# Patient Record
Sex: Female | Born: 1935
Health system: Southern US, Community
[De-identification: ages and names within clinical notes are randomized; demographics above are authoritative.]

## PROBLEM LIST (undated history)

## (undated) DIAGNOSIS — I350 Nonrheumatic aortic (valve) stenosis: Secondary | ICD-10-CM

## (undated) DIAGNOSIS — N182 Chronic kidney disease, stage 2 (mild): Secondary | ICD-10-CM

## (undated) DIAGNOSIS — E039 Hypothyroidism, unspecified: Secondary | ICD-10-CM

## (undated) DIAGNOSIS — M4302 Spondylolysis, cervical region: Secondary | ICD-10-CM

## (undated) DIAGNOSIS — G8929 Other chronic pain: Secondary | ICD-10-CM

## (undated) DIAGNOSIS — J449 Chronic obstructive pulmonary disease, unspecified: Secondary | ICD-10-CM

## (undated) DIAGNOSIS — M51369 Other intervertebral disc degeneration, lumbar region without mention of lumbar back pain or lower extremity pain: Secondary | ICD-10-CM

## (undated) DIAGNOSIS — M5136 Other intervertebral disc degeneration, lumbar region: Secondary | ICD-10-CM

## (undated) DIAGNOSIS — I2510 Atherosclerotic heart disease of native coronary artery without angina pectoris: Secondary | ICD-10-CM

## (undated) DIAGNOSIS — M8580 Other specified disorders of bone density and structure, unspecified site: Secondary | ICD-10-CM

## (undated) DIAGNOSIS — H9190 Unspecified hearing loss, unspecified ear: Secondary | ICD-10-CM

## (undated) DIAGNOSIS — I6529 Occlusion and stenosis of unspecified carotid artery: Secondary | ICD-10-CM

## (undated) DIAGNOSIS — K589 Irritable bowel syndrome without diarrhea: Secondary | ICD-10-CM

## (undated) DIAGNOSIS — R1319 Other dysphagia: Secondary | ICD-10-CM

## (undated) DIAGNOSIS — E785 Hyperlipidemia, unspecified: Secondary | ICD-10-CM

## (undated) DIAGNOSIS — Z952 Presence of prosthetic heart valve: Secondary | ICD-10-CM

## (undated) DIAGNOSIS — M545 Low back pain, unspecified: Secondary | ICD-10-CM

## (undated) DIAGNOSIS — I671 Cerebral aneurysm, nonruptured: Secondary | ICD-10-CM

## (undated) DIAGNOSIS — I10 Essential (primary) hypertension: Secondary | ICD-10-CM

## (undated) DIAGNOSIS — K219 Gastro-esophageal reflux disease without esophagitis: Secondary | ICD-10-CM

## (undated) DIAGNOSIS — R42 Dizziness and giddiness: Secondary | ICD-10-CM

## (undated) HISTORY — DX: Chronic kidney disease, stage 2 (mild): N18.2

## (undated) HISTORY — DX: Dizziness and giddiness: R42

## (undated) HISTORY — DX: Spondylolysis, cervical region: M43.02

## (undated) HISTORY — DX: Occlusion and stenosis of unspecified carotid artery: I65.29

## (undated) HISTORY — PX: CATARACT EXTRACTION W/ INTRAOCULAR LENS  IMPLANT, BILATERAL: SHX1307

## (undated) HISTORY — DX: Other intervertebral disc degeneration, lumbar region: M51.36

## (undated) HISTORY — PX: BLEPHAROPLASTY: SUR158

## (undated) HISTORY — PX: ABDOMINAL HYSTERECTOMY: SHX81

## (undated) HISTORY — PX: TONSILLECTOMY: SUR1361

## (undated) HISTORY — DX: Irritable bowel syndrome, unspecified: K58.9

## (undated) HISTORY — DX: Nonrheumatic aortic (valve) stenosis: I35.0

## (undated) HISTORY — PX: CATARACT EXTRACTION, BILATERAL: SHX1313

## (undated) HISTORY — PX: APPENDECTOMY: SHX54

## (undated) HISTORY — DX: Cerebral aneurysm, nonruptured: I67.1

## (undated) HISTORY — PX: HEMORROIDECTOMY: SUR656

## (undated) HISTORY — PX: CARDIAC CATHETERIZATION: SHX172

## (undated) HISTORY — DX: Other specified disorders of bone density and structure, unspecified site: M85.80

## (undated) HISTORY — PX: EYE SURGERY: SHX253

## (undated) HISTORY — DX: Atherosclerotic heart disease of native coronary artery without angina pectoris: I25.10

## (undated) HISTORY — DX: Other intervertebral disc degeneration, lumbar region without mention of lumbar back pain or lower extremity pain: M51.369

---

## 1997-02-22 HISTORY — PX: LUMBAR LAMINECTOMY: SHX95

## 1997-12-17 ENCOUNTER — Ambulatory Visit (HOSPITAL_COMMUNITY): Admission: RE | Admit: 1997-12-17 | Discharge: 1997-12-17 | Payer: Self-pay | Admitting: *Deleted

## 1997-12-17 ENCOUNTER — Encounter: Payer: Self-pay | Admitting: *Deleted

## 1998-01-09 ENCOUNTER — Encounter: Payer: Self-pay | Admitting: Specialist

## 1998-01-13 ENCOUNTER — Ambulatory Visit (HOSPITAL_COMMUNITY): Admission: RE | Admit: 1998-01-13 | Discharge: 1998-01-13 | Payer: Self-pay | Admitting: Specialist

## 1998-04-14 ENCOUNTER — Ambulatory Visit (HOSPITAL_COMMUNITY): Admission: RE | Admit: 1998-04-14 | Discharge: 1998-04-14 | Payer: Self-pay | Admitting: Specialist

## 1998-07-25 ENCOUNTER — Other Ambulatory Visit: Admission: RE | Admit: 1998-07-25 | Discharge: 1998-07-25 | Payer: Self-pay | Admitting: *Deleted

## 1998-08-28 ENCOUNTER — Ambulatory Visit (HOSPITAL_COMMUNITY): Admission: RE | Admit: 1998-08-28 | Discharge: 1998-08-28 | Payer: Self-pay | Admitting: Gastroenterology

## 1999-03-31 ENCOUNTER — Ambulatory Visit (HOSPITAL_COMMUNITY): Admission: RE | Admit: 1999-03-31 | Discharge: 1999-03-31 | Payer: Self-pay | Admitting: Orthopaedic Surgery

## 1999-04-15 ENCOUNTER — Ambulatory Visit (HOSPITAL_COMMUNITY): Admission: RE | Admit: 1999-04-15 | Discharge: 1999-04-15 | Payer: Self-pay | Admitting: Orthopaedic Surgery

## 1999-04-28 ENCOUNTER — Ambulatory Visit (HOSPITAL_COMMUNITY): Admission: RE | Admit: 1999-04-28 | Discharge: 1999-04-28 | Payer: Self-pay | Admitting: Orthopaedic Surgery

## 1999-05-12 ENCOUNTER — Ambulatory Visit (HOSPITAL_COMMUNITY): Admission: RE | Admit: 1999-05-12 | Discharge: 1999-05-12 | Payer: Self-pay | Admitting: Orthopaedic Surgery

## 1999-09-23 HISTORY — PX: LUMBAR LAMINECTOMY/DECOMPRESSION MICRODISCECTOMY: SHX5026

## 1999-10-14 ENCOUNTER — Inpatient Hospital Stay (HOSPITAL_COMMUNITY): Admission: RE | Admit: 1999-10-14 | Discharge: 1999-10-15 | Payer: Self-pay | Admitting: Orthopaedic Surgery

## 2000-08-13 ENCOUNTER — Encounter: Payer: Self-pay | Admitting: Emergency Medicine

## 2000-08-13 ENCOUNTER — Emergency Department (HOSPITAL_COMMUNITY): Admission: EM | Admit: 2000-08-13 | Discharge: 2000-08-13 | Payer: Self-pay | Admitting: Emergency Medicine

## 2000-09-07 ENCOUNTER — Encounter (HOSPITAL_COMMUNITY): Admission: RE | Admit: 2000-09-07 | Discharge: 2000-09-12 | Payer: Self-pay | Admitting: Family Medicine

## 2000-09-12 ENCOUNTER — Encounter (HOSPITAL_COMMUNITY): Admission: RE | Admit: 2000-09-12 | Discharge: 2000-12-11 | Payer: Self-pay | Admitting: Family Medicine

## 2000-09-22 HISTORY — PX: SKIN GRAFT SPLIT THICKNESS LEG / FOOT: SUR1303

## 2000-10-17 ENCOUNTER — Ambulatory Visit (HOSPITAL_BASED_OUTPATIENT_CLINIC_OR_DEPARTMENT_OTHER): Admission: RE | Admit: 2000-10-17 | Discharge: 2000-10-17 | Payer: Self-pay | Admitting: Specialist

## 2000-12-09 ENCOUNTER — Other Ambulatory Visit: Admission: RE | Admit: 2000-12-09 | Discharge: 2000-12-09 | Payer: Self-pay | Admitting: Family Medicine

## 2002-03-08 ENCOUNTER — Encounter: Payer: Self-pay | Admitting: *Deleted

## 2002-03-08 ENCOUNTER — Ambulatory Visit (HOSPITAL_COMMUNITY): Admission: RE | Admit: 2002-03-08 | Discharge: 2002-03-08 | Payer: Self-pay | Admitting: *Deleted

## 2002-04-17 ENCOUNTER — Ambulatory Visit (HOSPITAL_COMMUNITY): Admission: RE | Admit: 2002-04-17 | Discharge: 2002-04-17 | Payer: Self-pay | Admitting: Cardiology

## 2003-11-23 HISTORY — PX: COLONOSCOPY W/ BIOPSIES AND POLYPECTOMY: SHX1376

## 2003-11-29 ENCOUNTER — Ambulatory Visit (HOSPITAL_COMMUNITY): Admission: RE | Admit: 2003-11-29 | Discharge: 2003-11-29 | Payer: Self-pay | Admitting: Gastroenterology

## 2003-11-29 ENCOUNTER — Encounter (INDEPENDENT_AMBULATORY_CARE_PROVIDER_SITE_OTHER): Payer: Self-pay | Admitting: Specialist

## 2006-03-10 ENCOUNTER — Other Ambulatory Visit: Admission: RE | Admit: 2006-03-10 | Discharge: 2006-03-10 | Payer: Self-pay | Admitting: Family Medicine

## 2007-02-23 HISTORY — PX: BACK SURGERY: SHX140

## 2009-02-07 ENCOUNTER — Encounter: Admission: RE | Admit: 2009-02-07 | Discharge: 2009-02-07 | Payer: Self-pay | Admitting: Family Medicine

## 2009-06-10 ENCOUNTER — Encounter: Admission: RE | Admit: 2009-06-10 | Discharge: 2009-06-10 | Payer: Self-pay | Admitting: Family Medicine

## 2009-11-16 ENCOUNTER — Encounter (INDEPENDENT_AMBULATORY_CARE_PROVIDER_SITE_OTHER): Payer: Self-pay | Admitting: Family Medicine

## 2009-11-16 ENCOUNTER — Ambulatory Visit (HOSPITAL_COMMUNITY): Admission: RE | Admit: 2009-11-16 | Discharge: 2009-11-16 | Payer: Self-pay | Admitting: Family Medicine

## 2009-11-16 ENCOUNTER — Ambulatory Visit: Payer: Self-pay | Admitting: Vascular Surgery

## 2010-03-14 ENCOUNTER — Encounter: Payer: Self-pay | Admitting: Family Medicine

## 2010-07-10 NOTE — Op Note (Signed)
Becky Mccall, Becky Mccall            ACCOUNT NO.:  [id number]   MEDICAL RECORD NO.:  [id number]          PATIENT TYPE:  AMB   LOCATION:  ENDO                         FACILITY:  [facility]   PHYSICIAN:  Petra Kuba, M.D.    DATE OF BIRTH:  07-30-1929   DATE OF PROCEDURE:  11/29/2003  DATE OF DISCHARGE:                                 OPERATIVE REPORT   PROCEDURE:  Colonoscopy with polypectomy.   INDICATIONS FOR PROCEDURE:  History of colon polyps, due for repeat  screening.  Consent was signed after risks, benefits, methods, and options  were thoroughly discussed in the past.   MEDICATIONS:  Demerol 70, Versed 7.   DESCRIPTION OF PROCEDURE:  Rectal inspection was pertinent for external  hemorrhoids.  Digital examination was negative.  Video pediatric adjustable  colonoscope was inserted and fairly easily advanced around the colon to the  cecum.  This did require rolling her on her back and various abdominal  pressures.  No obvious abnormality was seen on insertion.  The cecum was  identified by the appendiceal orifice and the ileocecal valve.  The prep was  adequate.  There was some liquid stool that required washing and suctioning.  On slow withdrawal through the colon, the cecum was normal and the more  distal ascending near the hepatic flexure, a small polyp was seen and was  hot biopsied x2 and put in the first container.  The scope was slowly  withdrawn.  In the mid to distal sigmoid another tiny polyp was seen and was  hot biopsied x1 and put in a separate container.  There was a rare left-  sided diverticuli seen, but no other abnormalities.  Anorectal pullthrough  and retroflexion confirmed some small hemorrhoids.  The scope was reinserted  a short ways up the left side of the colon.  Air was suctioned and the scope  removed.  The patient tolerated the procedure well and there was no obvious  immediate complication.   ENDOSCOPIC ASSESSMENT:  1.  Internal and external  hemorrhoids.  2.  Rare left-sided diverticuli.  3.  Two tiny sigmoid and proximal level of hepatic flexure polyps hot      biopsied.  4.  Otherwise within normal limits to the cecum.   PLAN:  Await pathology.  Probably recheck colon screening in five years if  doing well medically.  Happy to see back p.r.n.  Otherwise return care to  Dr. Cliffton Asters for the customary health care maintenance to include yearly  rectals and guaiacs.       MEM/MEDQ  D:  11/29/2003  T:  11/29/2003  Job:  [id number]   cc:   Stacie Acres. White, M.D.  [address]  [place]  [place] [address]  Fax: [phone]

## 2010-07-10 NOTE — Op Note (Signed)
[place]. [facility]  Patient:    Becky Mccall, Becky Mccall Visit Number: [id number] MRN: [id number]          Service Type: DSU Location: [facility] Attending Physician:  Gustavus Messing Dictated by:   Yaakov Guthrie. Shon Hough, M.D. Proc. Date: 10/17/00 Adm. Date:  10/17/2000                             Operative Report  HISTORY OF PRESENT ILLNESS:  This is a 77 year old lady who has injured several years ago by having a severe evulsion injury to her left lower leg ankle area.  We treated it with intensive, intensive conservative treatment, allowing [place] to debride off eschar and devitalized dead tissue.  She now has open defects on the left posterior ankle area with excellent granulation tissue.  PROCEDURES PLANNED:  Split-thickness skin graft coverage.  SURGEON:  Yaakov Guthrie. Shon Hough, M.D.  ANESTHESIA:  General.  DESCRIPTION:  Patient underwent general anesthesia and intubated orally, was placed in a prone position, and prep was done to the ankle and leg areas and the left posterior thigh separately with Betadine soap and solution and walled off with sterile towels and draped so as to make a sterile field.  A split-thickness skin graft was taken from the posterior thigh using a Brown dermatome, 0.02 thickness, was placed over the defect and secured with 4-0 Prolene sutures.  A stent dressing was made using Xeroform, 4x4s, ABDs, Kerlix, and ACE wraps involving the ankle area.  The donor site was covered with scarlet red, Telfa, 4x4s, ABDs, Hypafix tape.  She withstood the procedures very well, was taken to recovery in excellent condition.  FOLLOW-UP:  The patient postoperatively is to stay off the ankle and not put any weightbearing.  She is to see me back in the office on this Friday for a dressing change, to review, and to check out the graft. Dictated by:   Yaakov Guthrie. Shon Hough, M.D. Attending Physician:  Gustavus Messing DD:  10/17/00 TD:   10/17/00 Job: [id number]

## 2010-07-10 NOTE — Op Note (Signed)
[place]. [facility]  Patient:    Becky Mccall, Becky Mccall                   MRN: [id number] Proc. Date: 10/14/99 Adm. Date:  [id number] Disc. Date: [id number] Attending:  Jacki Cones                           Operative Report  PREOPERATIVE DIAGNOSIS:  L3-4 spinal stenosis with herniated nucleus pulposus.  POSTOPERATIVE DIAGNOSIS:  L3-4 spinal stenosis with herniated nucleus pulposus.  PROCEDURE:  L3-4 decompression, bilateral foraminotomies, midline decompression and right L3-4 microdiskectomy.  SURGEON:  Mark C. Ophelia Charter, M.D.  ASSISTANT:  Quinn Plowman, P.A.-C.  ANESTHESIA:  GOT.  ESTIMATED BLOOD LOSS:  100 cc.  DESCRIPTION OF PROCEDURE:  After induction of a general anesthesia, the patient was placed in a kneeling position on the [place] frame with standard preoperative Ancef, Duraprep, squared towels, Betadine Vi-drape and laminectomy sheets and drapes.  Midline incision was made after needle localization with a cross-table lateral x-ray at the L3-4 space level. Subperiosteal dissection was performed.  Self-retaining retractor was placed. Subperiosteal dissection of the lamina.  Spinous process of L3 was taken leaving just the superior aspect and removing the inferior two-thirds of the lamina on both the right and the side.  Ligament was hypertrophic and the top portion of L4 was removed as well and foramina were enlarged.  Bone had to be removed with an osteotome out to the level of the pedicle with extreme thickening.  Remaining edges were smoothed with a Kerrison until bone had been removed to both the right and left level of the pedicle with no sharp spikes. This allowed the compressed dura to come out to normal size.  There was some foraminal stenosis with facet overgrowth and this was removed.  Hockey-stick was used for palpation. There were no free fragments present, but the disk was firm and bulging.  It was herniated with some  subligamentous herniation behind the body of L4 on the right side worse than the left.  Patient basically had stenosis symptoms and a 15 scalpel blade was used to make an annular incision and the extruded portion of the disk was removed.  ______ were made in the L3 disk, but this only accept a micropituitary and the disk was dehydrated.  No free fragments were present.  Foramina were carefully probed again and were loose.  After irrigation with saline solution, fascia was closed with 0 Vicryl, 2-0 Vicryl in subcutaneous tissue.  Skin staple closure, Marcaine infiltration in the skin and postoperative dressing.  Instrument count and needle counts were correct.  Patient transferred to recovery room in stable condition. DD:  10/14/99 TD:  10/15/99 Job: [id number]

## 2010-07-10 NOTE — H&P (Signed)
[place]. [facility]  Patient:    Becky Mccall, Becky Mccall                     MRN: [id number] Adm. Date:  10/14/99 Attending:  Loraine Leriche C. Ophelia Charter, M.D. Dictator:   Madolyn Frieze. [place], P.A.                         History and Physical  DATE OF BIRTH: 1929/03/16  CHIEF COMPLAINT: Low back pain for many years.  HISTORY OF PRESENT ILLNESS: This patient is a 77 year old white female who presents with progressive low back pain for the last several years.  The patient states the back pain is worse when she is standing and walking during the day, to the point where she cannot complete her activities of daily living most recently.  She says the pain is relieved somewhat by sitting; however, any activity such as walking and standing for prolonged periods of time precipitated episodes of pain.  She rates her pain as 9/10, with 10 being the worst pain experienced.  She has failed conservative measures including epidural steroid injection x 3 and nonsteroidal anti-inflammatories and narcotics.  She has no radicular pain.  All her pain is located in her central lumbosacral spine.  She denies any associated symptoms such as numbness, tingling, or weakness of the lower extremities.  She denies any specific trauma or injury.  ALLERGIES: No known drug allergies.  MEDICATIONS:  1. Premarin 0.625 mg 1 tablet q.d.  2. Synthroid 0.125 mg 1 q.d.  3. Lipitor 20 mg 1 tablet q.d.  4. Naproxen 500 mg 1 tablet b.i.d.  5. Vitamin C 1000 IU 1 q.d.  6. Vitamin E 800 IU 1 q.d.  7. Calcium 600 mg 1 tablet q.d.  PAST SURGICAL HISTORY:  1. In 1949 she had "tubes infected".  2. In 1979 she had total abdominal hysterectomy.  3. She had hemorrhoid fissure surgery in 1979.  4. She had colonoscopy performed in 1995 and 2000.  SOCIAL HISTORY: She is a nonsmoker; however, she smoked two to three packs of cigarettes per day for 35 years.  She has one drink of rupture of membranes per day.  She  is retired.  She is married.  FAMILY HISTORY: Positive for CAD, MI, CVA, cancer the breast and lung.  She denies diabetes mellitus.  REVIEW OF SYSTEMS: A 14 point Review Of Systems was conducted, showing positive findings of glasses, upper and lower dentures, heartburn, history of chronic shortness of breath, osteoarthritis, frequent urinary frequency; however, the patient attributes this to drinking a large amount of tea in a day.  She has sinus problems with dizziness two to three minutes at a time. She denies any chest pain, PND, change in appetite, recent unexpected weight loss, fever, chills, nausea, vomiting, diarrhea, constipation, heat or cold intolerance, abdominal pain, jaundice, hepatitis, decreased sensation in extremities.  PHYSICAL EXAMINATION:  VITAL SIGNS: Temperature 97.5 degrees, pulse 76 and regular, respirations 16 and regular, blood pressure 120/72.  GENERAL: The patient is a 77 year old white female in no acute distress, well-developed, well-nourished, and alert and oriented x 3.  HEENT: Head normocephalic, atraumatic.  Ears show TMs pearly gray bilaterally. PERRLA.  EOMI.  Throat without erythema or exudate.  Nose without erythema.  NECK: Without bruits, JVD, thyromegaly, or lymphadenopathy.  CHEST: Clear to auscultation bilaterally.  HEART: Regular rate and rhythm without murmur, S1 and S2.  BREAST/GU: Examinations deferred.  ABDOMEN:  Soft.  Bowel sounds present.  No masses, tenderness, or organomegaly.  EXTREMITIES: Positive pain to palpation over the lumbosacral spine and slight pain over the sciatic notch bilaterally.  Motor strength of the lower extremities is 5/5 in the anterior tibial and EHL and gastrocnemius.  Deep tendon reflexes are 2/4 bilaterally and symmetric.  Neurovascular sensation intact.  Negative straight leg test bilaterally.  Good flexion at the waist and extension at the waist and lateral bending, touching the knees.  SKIN:  Without active infection.  She has bruises on the forearms.  LABORATORY DATA: MRI is consistent with an L3-L4 herniated nucleus pulposus with spinal stenosis.  Preoperative laboratories reveal chest x-ray showing stable chest with no evidence of acute cardiac or pulmonary process.  EKG reveals normal sinus rhythm, normal EKG.  WBC 7.5 (done on October 09, 1999), RBC 4.48, hemoglobin 13.7, hematocrit 40.4; MCV 90.2; MCHC 33.9; RDW 12.8; platelets 126,000.  PT 13.5, INR 1.1, PTT 28. Sodium 141, potassium 5.0, chloride 107, carbon dioxide 27, glucose 90, BUN 16, creatinine 0.9.  Calcium 9.5.  PT 6.9.  ALD 4.1, AST 29, ALT 1380, ALP 62, total bilirubin 0.8.  Urinalysis is pending at this time.  ASSESSMENT: L3-L4 herniated nucleus pulposus with stenosis.  PLAN: The plan is to have the patient undergo L3-L4 decompression.  The patient was explained the nonoperative and operative treatment courses and the plan of procedure was discussed.  The risks of the procedure including infection, bleeding, spinal cord damage, dural tear, anesthesia complications, reoperation, artery and nerve damage were discussed and the patient understood.  All questions were answered fully and with no further questions the patient wished to proceed with the planned procedure. DD:  10/14/99 TD:  10/14/99 Job: [id number]

## 2010-07-10 NOTE — Discharge Summary (Signed)
[place]. [facility]  Patient:    Becky Mccall, Becky Mccall                     MRN: [id number] Attending:  Veverly Fells. Ophelia Charter, M.D. Dictator:   Quinn Plowman, P.A.-C.                           Discharge Summary  ADMISSION DIAGNOSIS:  L4-L5 herniated nucleus pulposus and spinal stenosis.  DISCHARGE DIAGNOSIS:  L3-L4 herniated nucleus pulposus and spinal stenosis.  ADDITIONAL DISCHARGE DIAGNOSES: 1. Hypercholesterolemia. 2. Hypothyroid. 3. Osteoarthritis. 4. Spinal stenosis. 5. Estrogen replacement therapy. 6. Osteoporosis. 7. Status post total abdominal hysterectomy. 8. Status post hemorrhoid fissure surgery. 9. Status post colonoscopy.  PROCEDURES:  On October 14, 1999, Mrs. Guardino underwent an L3-L4 decompression and a right microdissection for the diagnosis of L3-L4 herniated nucleus pulposus and spinal stenosis.  Surgeon was [employer]. Ophelia Charter, M.D.  First assistant week Madolyn Frieze. [place], Advice worker certified.  Anesthesia: GET.  Estimated blood loss:  250 cc.  Complications:  None.  Drains:  None. Specimens:  None.  HISTORY OF PRESENT ILLNESS:  This is a 77 year old white female with progressively worsening low back pain for many years.  However, for the last several months the pain has been significantly worse.  She denies any radiation down her legs.  The pain is worse when she is standing and walking, better when she is sitting.  She has failed conservative therapy including epidural steroid injections x 3, multiple nonsteroidal anti-inflammatories, and narcotics.  The patient rates her pain as a 7/10 being the worst pain ever experienced.  She denies any associated symptoms such as numbness and tingling of the lower extremities or any abnormalities of her hips and knees and ankles bilaterally.  PHYSICAL EXAMINATION:  GENERAL:  [age]-year-old white female in no acute distress. Well-developed, well-nourished, alert and oriented x  3.  CHEST:  Clear to auscultation bilaterally.  Without wheezing, rhonchi, or rales.  HEART:  Regular rate and rhythm.  Without murmur, S1, S2.  ABDOMEN:  Soft.  Bowel sounds present.  No masses, tenderness, or organomegaly.  EXTREMITIES:  Positive tenderness to palpation of the lumbosacral spine and some sciatic notch tenderness.  NEUROLOGIC:  Her motor strength of her lower extremities, to her anterior tibia, gastrocnemius, and EHL was positive, was +5/5.  Deep tendon reflexes 2/4 bilaterally, symmetrical.  Neurovascular sensation is intact bilaterally. She has negative straight leg raise testing in the sitting and supine positions.  Also negative contralateral straight leg testing.  SKIN:  Without active infection; however, there is bruising and ecchymosis noted on bilateral forearms.  LABORATORY DATA:  MRI was consistent with an L3-L4 herniated nucleus pulposus and spinal stenosis.  Urinalysis is negative for nitrates, protein, ketones, bilirubin, hemoglobin, glucose, pH 5.5, specific gravity 1.019.  White blood count was 5.7, RBC count 4.4, hemoglobin 13.7, hematocrit 40.4, MCV 90.2, MCHC 33.9, RDW 12.8, platelets 216,000.  PT is 13.5, INR is 1.1, PTT is 28.  Sodium is 141, potassium 5.0, chloride 107, CO2 27, glucose 90, BUN 16, creatinine 0.9, calcium 9.5.  calcium is 9.5, SGOT 6.9, ALB is 4.1, AST is 20, ALT is 13, ALP is 62, total bilirubin is 0.8.  Chest x-ray:  Stable chest, without evidence of acute cardiac or acute pulmonary process.  EKG is normal sinus rhythm, normal EKG.  HOSPITAL COURSE:  On October 14, 1999, the patient underwent surgery for preoperative diagnosis  of L3-L4 herniated nucleus pulposus and spinal stenosis.  The procedure was an L3-L4 decompression with an L3-L4 right microdiskectomy performed by Dr. Loraine Leriche C. Ophelia Charter, first assistant Madolyn Frieze. Robeson, physician assistant certified on postoperative day #1, 10/15/99.  Discharge note:  The patient  had no complaints.  Her pain is in good control with p.o. Tylox.  She ambulates to the bathroom well without assistance.  Eats well without any nausea or vomiting.  She denies any numbness or tingling of her lower extremities.  She is agreeable to be discharged home today.  She denies any fever, chills, nausea, vomiting, diarrhea, chest pain, headaches, shortness of breath.  On exam today, her anterior tibia, EHL, and gastrocnemius are +5/5 bilaterally.  Her neurovascular sensation is intact. Vital signs prior to discharge show temperature 98.1, pulse 77, respirations 20, BP is 138/72, room air oxygen saturation is 97%.  PLAN:  Discharge home.  DIAGNOSIS:  L3-L4 herniated nucleus pulposus and spinal stenosis.  CONDITION ON DISCHARGE:  Stable.  FOLLOW-UP:  Will see her back in the office in one week with Dr. Ophelia Charter. Telephone number given, [phone].  If any abnormalities or problems would arise, please give a call to the office at the same number, and care will be rendered promptly.  DISCHARGE MEDICATIONS: 1. Prescription for Tylox was written for her to be filled, 1 tablet    p.o. q.4-6h. p.r.n.    severe pain, #30 with 0 refills. 2. She should resume all her home medicines, which would include:    a. Premarin 0.625 mg 1 q.a.m.    b. Synthroid 0.125 mg q.a.m.    c. Lipitor 20 mg 1 q.p.m.    d. Naproxen 500 mg 1 b.i.d.    e. Vitamin C 1000 mg.    f. Vitamin E 800 IU.    g. Calcium 600 mg.    h. Nasal spray saline p.r.n.  WOUND CARE:  Instructions were given  for showering, dressing changes.  She should receive a dressing change before she leaves today.  DIET:  No restrictions.  DISCHARGE INSTRUCTIONS:  She was advised no heavy lifting until further advice given one week in the office per Mark C. Ophelia Charter, M.D. DD:  10/15/99 TD:  10/16/99 Job: [id number]

## 2011-04-30 ENCOUNTER — Other Ambulatory Visit: Payer: Self-pay | Admitting: Sports Medicine

## 2011-04-30 DIAGNOSIS — M5410 Radiculopathy, site unspecified: Secondary | ICD-10-CM

## 2011-04-30 DIAGNOSIS — M545 Low back pain, unspecified: Secondary | ICD-10-CM

## 2011-05-06 ENCOUNTER — Ambulatory Visit
Admission: RE | Admit: 2011-05-06 | Discharge: 2011-05-06 | Disposition: A | Discharge status: Home / Self Care | Payer: Medicare Other | Source: Ambulatory Visit | Attending: Sports Medicine | Admitting: Sports Medicine

## 2011-05-06 DIAGNOSIS — M545 Low back pain, unspecified: Secondary | ICD-10-CM

## 2011-05-06 DIAGNOSIS — M5410 Radiculopathy, site unspecified: Secondary | ICD-10-CM

## 2011-05-06 MED ORDER — GADOBENATE DIMEGLUMINE 529 MG/ML IV SOLN
20.0000 mL | Freq: Once | INTRAVENOUS | Status: AC | PRN
  Administered 2011-05-06: 20 mL via INTRAVENOUS

## 2012-02-23 HISTORY — PX: POSTERIOR FUSION LUMBAR SPINE: SUR632

## 2012-12-01 ENCOUNTER — Other Ambulatory Visit: Payer: Self-pay | Admitting: Neurosurgery

## 2012-12-01 DIAGNOSIS — M5126 Other intervertebral disc displacement, lumbar region: Secondary | ICD-10-CM

## 2012-12-05 ENCOUNTER — Ambulatory Visit
Admission: RE | Admit: 2012-12-05 | Discharge: 2012-12-05 | Disposition: A | Discharge status: Home / Self Care | Payer: Medicare Other | Source: Ambulatory Visit | Attending: Neurosurgery | Admitting: Neurosurgery

## 2012-12-05 DIAGNOSIS — M5126 Other intervertebral disc displacement, lumbar region: Secondary | ICD-10-CM

## 2012-12-14 ENCOUNTER — Other Ambulatory Visit: Payer: Self-pay | Admitting: Neurosurgery

## 2012-12-26 ENCOUNTER — Encounter (HOSPITAL_COMMUNITY): Payer: Self-pay | Admitting: Pharmacy Technician

## 2012-12-28 NOTE — Pre-Procedure Instructions (Signed)
NAVJOT PILGRIM  12/28/2012   Your procedure is scheduled on:  Monday, November 17th.  Report to [facility], Main Entrance [facility] "A"at 9:55 AM.  Call this number if you have problems the morning of surgery: [phone]   Remember:   Do not eat food or drink liquids after midnight,  Sunday, November 16th.   Take these medicines the morning of surgery with A SIP OF WATER: None.              Stop taking herbal medications, including vitamins and fish oil, on Monday, November 10 th.   Do not wear jewelry, make-up or nail polish.  Do not wear lotions, powders, or perfumes. You may wear deodorant.  Do not shave 48 hours prior to surgery.   Do not bring valuables to the hospital.  [facility] is not responsible                  for any belongings or valuables.               Contacts, dentures or bridgework may not be worn into surgery.  Leave suitcase in the car. After surgery it may be brought to your room.  For patients admitted to the hospital, discharge time is determined by your  treatment team.                Special Instructions: Shower using CHG 2 nights before surgery and the night before surgery.  If you shower the day of surgery use CHG.  Use special wash - you have one bottle of CHG for all showers.  You should use approximately 1/3 of the bottle for each shower.   Please read over the following fact sheets that you were given: Pain Booklet, Coughing and Deep Breathing, Blood Transfusion Information and Surgical Site Infection Prevention

## 2012-12-29 ENCOUNTER — Ambulatory Visit (HOSPITAL_COMMUNITY)
Admission: RE | Admit: 2012-12-29 | Discharge: 2012-12-29 | Disposition: A | Discharge status: Home / Self Care | Payer: Medicare Other | Source: Ambulatory Visit | Attending: Anesthesiology | Admitting: Anesthesiology

## 2012-12-29 ENCOUNTER — Encounter (HOSPITAL_COMMUNITY): Payer: Self-pay | Admitting: Vascular Surgery

## 2012-12-29 ENCOUNTER — Encounter (HOSPITAL_COMMUNITY)
Admission: RE | Admit: 2012-12-29 | Discharge: 2012-12-29 | Disposition: A | Discharge status: Home / Self Care | Payer: Medicare Other | Source: Ambulatory Visit | Attending: Neurosurgery | Admitting: Neurosurgery

## 2012-12-29 ENCOUNTER — Encounter (HOSPITAL_COMMUNITY): Payer: Self-pay

## 2012-12-29 DIAGNOSIS — Z01812 Encounter for preprocedural laboratory examination: Secondary | ICD-10-CM | POA: Insufficient documentation

## 2012-12-29 DIAGNOSIS — Z01818 Encounter for other preprocedural examination: Secondary | ICD-10-CM | POA: Insufficient documentation

## 2012-12-29 DIAGNOSIS — Z01811 Encounter for preprocedural respiratory examination: Secondary | ICD-10-CM | POA: Insufficient documentation

## 2012-12-29 DIAGNOSIS — Z01810 Encounter for preprocedural cardiovascular examination: Secondary | ICD-10-CM | POA: Insufficient documentation

## 2012-12-29 HISTORY — DX: Essential (primary) hypertension: I10

## 2012-12-29 HISTORY — DX: Other dysphagia: R13.19

## 2012-12-29 HISTORY — DX: Unspecified hearing loss, unspecified ear: H91.90

## 2012-12-29 HISTORY — DX: Hyperlipidemia, unspecified: E78.5

## 2012-12-29 HISTORY — DX: Hypothyroidism, unspecified: E03.9

## 2012-12-29 HISTORY — DX: Gastro-esophageal reflux disease without esophagitis: K21.9

## 2012-12-29 HISTORY — DX: Chronic obstructive pulmonary disease, unspecified: J44.9

## 2012-12-29 LAB — BASIC METABOLIC PANEL
CO2: 22 mEq/L (ref 19–32)
GFR calc Af Amer: 67 mL/min — ABNORMAL LOW (ref >90)
GFR calc non Af Amer: 58 mL/min — ABNORMAL LOW (ref >90)
Glucose, Bld: 107 mg/dL — ABNORMAL HIGH (ref 70–99)
Potassium: 3.8 mEq/L (ref 3.5–5.1)
Sodium: 137 mEq/L (ref 135–145)

## 2012-12-29 LAB — CBC
Hemoglobin: 13.9 g/dL (ref 12.0–15.0)
MCH: 32.6 pg (ref 26.0–34.0)
MCV: 95.1 fL (ref 78.0–100.0)
RBC: 4.26 MIL/uL (ref 3.87–5.11)

## 2012-12-29 LAB — SURGICAL PCR SCREEN: MRSA, PCR: NEGATIVE

## 2012-12-29 LAB — TYPE AND SCREEN
ABO/RH(D): O POS
Antibody Screen: NEGATIVE

## 2012-12-29 LAB — ABO/RH: ABO/RH(D): O POS

## 2012-12-29 NOTE — Pre-Procedure Instructions (Deleted)
Becky Mccall  12/29/2012   Your procedure is scheduled on:  Monday, November 17th.  Report to [facility], Main Entrance /Entrance "A"at 9:55 AM.  Call this number if you have problems the morning of surgery: [phone]   Remember:   Do not eat food or drink liquids after midnight,  Sunday, November 16th.   Take these medicines the morning of surgery with A SIP OF WATER: None.              Stop taking herbal medications, including vitamins and fish oil, on Monday, November 10 th.   Do not wear jewelry, make-up or nail polish.  Do not wear lotions, powders, or perfumes. You may wear deodorant.  Do not shave 48 hours prior to surgery.   Do not bring valuables to the hospital.  [facility] is not responsible  for any belongings or valuables.               Contacts, dentures or bridgework may not be worn into surgery.  Leave suitcase in the car. After surgery it may be brought to your room.  For patients admitted to the hospital, discharge time is determined by your  treatment team.                Special Instructions: Shower using CHG 2 nights before surgery and the night before surgery.  If you shower the day of surgery use CHG.  Use special wash - you have one bottle of CHG for all showers.  You should use approximately 1/3 of the bottle for each shower.   Please read over the following fact sheets that you were given: Pain Booklet, Coughing and Deep Breathing, Blood Transfusion Information and Surgical Site Infection Prevention     

## 2012-12-29 NOTE — Pre-Procedure Instructions (Signed)
Becky Mccall  12/29/2012   Your procedure is scheduled on:  Monday, November 17th.  Report to [facility], Main Entrance [facility] "A"at 9:55 AM.  Call this number if you have problems the morning of surgery: [phone]   Remember:   Do not eat food or drink liquids after midnight,  Sunday, November 16th.   Take these medicines the morning of surgery with A SIP OF WATER: None.              Stop taking herbal medications, including vitamins and fish oil, on Monday, November 10 th.   Do not wear jewelry, make-up or nail polish.  Do not wear lotions, powders, or perfumes. You may wear deodorant.  Do not shave 48 hours prior to surgery.   Do not bring valuables to the hospital.  [facility] is not responsible  for any belongings or valuables.               Contacts, dentures or bridgework may not be worn into surgery.  Leave suitcase in the car. After surgery it may be brought to your room.  For patients admitted to the hospital, discharge time is determined by your  treatment team.                Special Instructions: Shower using CHG 2 nights before surgery and the night before surgery.  If you shower the day of surgery use CHG.  Use special wash - you have one bottle of CHG for all showers.  You should use approximately 1/3 of the bottle for each shower.   Please read over the following fact sheets that you were given: Pain Booklet, Coughing and Deep Breathing, Blood Transfusion Information and Surgical Site Infection Prevention

## 2012-12-29 NOTE — Progress Notes (Signed)
Pt has draining wound on left lower leg ; was evaluated by Dr Cliffton Asters and dressed. Pt has a prescription for ABX.which she will start today. [facility] made  aware . Pt is to see Dr Cliffton Asters next week for reevaluation of wound. And medical clearance for surgery.

## 2013-01-01 ENCOUNTER — Encounter (HOSPITAL_COMMUNITY): Payer: Self-pay

## 2013-01-01 ENCOUNTER — Other Ambulatory Visit: Payer: Self-pay | Admitting: General Surgery

## 2013-01-01 ENCOUNTER — Encounter: Payer: Self-pay | Admitting: General Surgery

## 2013-01-01 DIAGNOSIS — Z01818 Encounter for other preprocedural examination: Secondary | ICD-10-CM

## 2013-01-01 NOTE — Progress Notes (Addendum)
Anesthesia Chart Review:  Patient is a 79 year old female scheduled for L2-3, L3-4, L4-5 PLIF on 01/08/13 by Dr. Wynetta Emery. History includes former smoker, HTN, hypothyroidism, COPD, GERD, hard of hearing, intermittent dysphagia, arthritis, HLD, CHF '04, mild AS/AR by 09/2012 echo, prior back surgery, hysterectomy, cataract extraction.    PCP is Dr. Samuel Jester who cleared patient from a medical standpoint but recommended contacting cardiologist Dr. Armanda Magic for cardiac clearance.  Patient is scheduled for a nuclear stress test on 01/04/13. (Of note, patient is was started on antibiotic therapy for a draining leg wound on 12/29/12 and is following up with Dr. Cliffton Asters this week for re-evaluation to make sure it is healing well in time for her planned surgery.  Patient's PAT RN has already notified Erie Noe at Dr. Lonie Peak office.)  EKG on 12/29/12 showed SR with first degree AVB.  Echo on 10/05/2012 [facility]) showed moderate concentric LVH, EF 56%, mild LAE, aortic valve sclerosis but opens well, mild AS, mild AR, mild TR, trace PR, Doppler findings suggestive of grade 1 diastolic dysfunction without elevated LA pressure.   CXR on 12/29/12 showed: Chronic COPD and scarring. No superimposed acute process. Aortic atherosclerosis.  Preoperative labs noted.   Will follow-up stress test results when available.  If low risk, then I would anticipate that she would be cleared.    Velna Ochs [facility] Short Stay Center/Anesthesiology Phone [phone] 01/01/2013 2:06 PM  Addendum: 01/04/2013 5:50 PM Nuclear stress test from earlier today showed: Low risk stress nuclear study very slight reversibility in the inferolateral wall which most likely represents diaphragmatic attenuation. LV Ejection Fraction: 75%. LV Wall Motion: NL LV Function; NL Wall Motion.

## 2013-01-04 ENCOUNTER — Encounter: Payer: Self-pay | Admitting: Cardiovascular Disease

## 2013-01-04 ENCOUNTER — Other Ambulatory Visit: Payer: Self-pay | Admitting: Family Medicine

## 2013-01-04 ENCOUNTER — Ambulatory Visit (HOSPITAL_COMMUNITY): Payer: Medicare Other | Attending: Cardiology | Admitting: Radiology

## 2013-01-04 VITALS — BP 165/76 | Ht 67.0 in | Wt 184.0 lb

## 2013-01-04 DIAGNOSIS — E785 Hyperlipidemia, unspecified: Secondary | ICD-10-CM | POA: Insufficient documentation

## 2013-01-04 DIAGNOSIS — Z01818 Encounter for other preprocedural examination: Secondary | ICD-10-CM

## 2013-01-04 DIAGNOSIS — Z87891 Personal history of nicotine dependence: Secondary | ICD-10-CM | POA: Insufficient documentation

## 2013-01-04 DIAGNOSIS — Z01810 Encounter for preprocedural cardiovascular examination: Secondary | ICD-10-CM | POA: Insufficient documentation

## 2013-01-04 DIAGNOSIS — Z8249 Family history of ischemic heart disease and other diseases of the circulatory system: Secondary | ICD-10-CM | POA: Insufficient documentation

## 2013-01-04 DIAGNOSIS — R0602 Shortness of breath: Secondary | ICD-10-CM

## 2013-01-04 DIAGNOSIS — R0989 Other specified symptoms and signs involving the circulatory and respiratory systems: Secondary | ICD-10-CM

## 2013-01-04 DIAGNOSIS — I10 Essential (primary) hypertension: Secondary | ICD-10-CM | POA: Insufficient documentation

## 2013-01-04 MED ORDER — TECHNETIUM TC 99M SESTAMIBI GENERIC - CARDIOLITE
33.0000 | Freq: Once | INTRAVENOUS | Status: AC | PRN
  Administered 2013-01-04: 33 via INTRAVENOUS

## 2013-01-04 MED ORDER — REGADENOSON 0.4 MG/5ML IV SOLN
0.4000 mg | Freq: Once | INTRAVENOUS | Status: AC
  Administered 2013-01-04: 0.4 mg via INTRAVENOUS

## 2013-01-04 MED ORDER — TECHNETIUM TC 99M SESTAMIBI GENERIC - CARDIOLITE
11.0000 | Freq: Once | INTRAVENOUS | Status: AC | PRN
  Administered 2013-01-04: 11 via INTRAVENOUS

## 2013-01-04 NOTE — Progress Notes (Signed)
[facility] SITE 3 NUCLEAR MED [address] [place], [place] [address] [phone]    Cardiology Nuclear Med Study  Becky Mccall is a 79 y.o. female     MRN : [id number]     DOB: 01/27/1930  Procedure Date: 01/04/2013  Nuclear Med Background Indication for Stress Test:  Evaluation for Ischemia and Surgical Clearance- Back surgery Dr. Donalee Citrin History:ECHO 8/14, EF:56 mild AS,A1:PREVIOS NUCLEAR STUDY /10 YRS AGO NML Cardiac Risk Factors: Family History - CAD, History of Smoking, Hypertension and Lipids  Symptoms:  SOB   Nuclear Pre-Procedure Caffeine/Decaff Intake:  None NPO After: 9:00pm   Lungs:  clear O2 Sat: 98% on room air. IV 0.9% NS with Angio Cath:  22g  IV Site: R Forearm  IV Started by:  Bonnita Levan, RN  Chest Size (in):  42 Cup Size: C  Height: 5\' 7"  (1.702 m)  Weight:  184 lb (83.462 kg)  BMI:  Body mass index is 28.81 kg/(m^2). Tech Comments:  N/A    Nuclear Med Study 1 or 2 day study: 1 day  Stress Test Type:  Lexiscan  Reading MD: Armanda Magic, MD  Order Authorizing Provider:  Armanda Magic, MD  Resting Radionuclide: Technetium 60m Sestamibi  Resting Radionuclide Dose: 10.7 mCi   Stress Radionuclide:  Technetium 79m Sestamibi  Stress Radionuclide Dose: 33.0 mCi           Stress Protocol Rest HR: 82 Stress HR: 90  Rest BP: 165/76 Stress BP: 133/47  Exercise Time (min): n/a METS: n/a   Predicted Max HR: 139 bpm % Max HR: 66.91 bpm Rate Pressure Product: 21308   Dose of Adenosine (mg):  n/a Dose of Lexiscan: 0.4 mg  Dose of Atropine (mg): n/a Dose of Dobutamine: n/a mcg/kg/min (at max HR)  Stress Test Technologist: Frederick Peers, EMT-P  Nuclear Technologist:  Domenic Polite, CNMT     Rest Procedure:  Myocardial perfusion imaging was performed at rest 45 minutes following the intravenous administration of Technetium 23m Sestamibi. Rest ECG: NSR - Normal EKG  Stress Procedure:  The patient received IV Lexiscan 0.4 mg over  15-seconds.  Technetium 69m Sestamibi injected at 30-seconds.  Quantitative spect images were obtained after a 45 minute delay. Stress ECG: No significant change from baseline ECG  QPS Raw Data Images:  Mild diaphragmatic attenuation.  Normal left ventricular size. Stress Images:  There is decreased uptake in the lateral wall. Rest Images:  There is decreased uptake in the lateral wall. Subtraction (SDS):  there is slight reversibility in the inferolateral wall  Transient Ischemic Dilatation (Normal <1.22):  0.90 Lung/Heart Ratio (Normal <0.45):  0.32  Quantitative Gated Spect Images QGS EDV:  63 ml QGS ESV:  16 ml  Impression Exercise Capacity:  Lexiscan with no exercise. BP Response:  Normal blood pressure response. Clinical Symptoms:  No significant symptoms noted. ECG Impression:  No significant ST segment change suggestive of ischemia. Comparison with Prior Nuclear Study: No images to compare  Overall Impression:  Low risk stress nuclear study very slight reversibility in the inferolateral wall which most likely represents diaphragmatic attenuation.  LV Ejection Fraction: 75%.  LV Wall Motion:  NL LV Function; NL Wall Motion   Signed, Armanda Magic, MD 01/04/2013

## 2013-01-05 ENCOUNTER — Telehealth: Payer: Self-pay | Admitting: General Surgery

## 2013-01-05 ENCOUNTER — Ambulatory Visit (HOSPITAL_COMMUNITY)
Admission: RE | Admit: 2013-01-05 | Discharge: 2013-01-05 | Disposition: A | Discharge status: Home / Self Care | Payer: Medicare Other | Source: Ambulatory Visit | Attending: Vascular Surgery | Admitting: Vascular Surgery

## 2013-01-05 ENCOUNTER — Other Ambulatory Visit (HOSPITAL_COMMUNITY): Payer: Self-pay | Admitting: Family Medicine

## 2013-01-05 DIAGNOSIS — L97209 Non-pressure chronic ulcer of unspecified calf with unspecified severity: Secondary | ICD-10-CM | POA: Insufficient documentation

## 2013-01-05 NOTE — Telephone Encounter (Signed)
Pt is aware and signed sx clearance sent to pts surgeon

## 2013-01-05 NOTE — Telephone Encounter (Signed)
Low risk nuclear stress test with no symptoms - patient cleared for surgery

## 2013-01-05 NOTE — Telephone Encounter (Signed)
Dr. Mayford Knife asked that I call pt and ask if she has had any CP or SOB before we clear her for SX. Pt denied any of the symptoms. To Dr. Mayford Knife to make aware.

## 2013-01-08 ENCOUNTER — Encounter (HOSPITAL_COMMUNITY): Admission: RE | Payer: Self-pay | Source: Ambulatory Visit

## 2013-01-08 ENCOUNTER — Inpatient Hospital Stay (HOSPITAL_COMMUNITY): Admission: RE | Admit: 2013-01-08 | Payer: Medicare Other | Source: Ambulatory Visit | Admitting: Neurosurgery

## 2013-01-08 SURGERY — POSTERIOR LUMBAR FUSION 3 LEVEL
Anesthesia: General | Site: Back

## 2013-01-09 ENCOUNTER — Encounter (HOSPITAL_BASED_OUTPATIENT_CLINIC_OR_DEPARTMENT_OTHER): Payer: Medicare Other | Attending: General Surgery

## 2013-01-09 DIAGNOSIS — R609 Edema, unspecified: Secondary | ICD-10-CM | POA: Insufficient documentation

## 2013-01-09 DIAGNOSIS — L989 Disorder of the skin and subcutaneous tissue, unspecified: Secondary | ICD-10-CM | POA: Insufficient documentation

## 2013-01-09 DIAGNOSIS — X58XXXA Exposure to other specified factors, initial encounter: Secondary | ICD-10-CM | POA: Insufficient documentation

## 2013-01-09 DIAGNOSIS — S99919A Unspecified injury of unspecified ankle, initial encounter: Secondary | ICD-10-CM | POA: Insufficient documentation

## 2013-01-09 DIAGNOSIS — S8990XA Unspecified injury of unspecified lower leg, initial encounter: Secondary | ICD-10-CM | POA: Insufficient documentation

## 2013-01-10 NOTE — H&P (Signed)
Becky Mccall, Becky Mccall            ACCOUNT NO.:  [id number]  MEDICAL RECORD NO.:  [id number]  LOCATION:  FOOT                         FACILITY:  [facility]  PHYSICIAN:  Joanne Gavel, M.D.        DATE OF BIRTH:  04-26-1929  DATE OF ADMISSION:  01/09/2013 DATE OF DISCHARGE:                             HISTORY & PHYSICAL   CHIEF COMPLAINT:  Wound, left leg.  HISTORY OF PRESENT ILLNESS:  This is an healthy 79 year old female, who banged her leg approximately 1 month ago.  This was treated with antibiotics and triple antibiotic ointment.  At first, it was a great deal of drainage, but this has decreased now.  There is a moderate to minimal amount of pain.  PAST MEDICAL HISTORY:  Significant for osteoarthritis, hypothyroidism, fibrocystic breast disease, irritable bowel syndrome, cervical spondylosis, hiatal hernia with reflux, COPD, osteopenia, hypercholesterolemia, hearing loss, squamous cell carcinoma of the leg treated with skin graft years ago, and congestive heart failure.  PAST SURGICAL HISTORY:  Back surgery in 2009; back surgery in 2001; hemorrhoids; skin grafting of the leg in 2002; hysterectomy in 1949.  SOCIAL HISTORY:  Cigarettes none.  Alcohol, occasionally or a small amount daily.  ALLERGIES:  MEVACOR, LISINOPRIL, ZETIA, LIPITOR, VYTORIN, RELAFEN, WELCHOL and CRESTOR.  MEDICATIONS:  Augmentin, Naprosyn, levothyroxine, Spiriva, hydrochlorothiazide, multivitamins and fish oil, and calcium.  REVIEW OF SYSTEMS:  Essentially as above.  PHYSICAL EXAMINATION:  VITAL SIGNS:  Temperature 98.2, pulse 92 and regular, respirations 18, blood pressure 157/78.  GENERAL APPEARANCE: Well developed, well nourished, in no distress. CHEST:  Clear. HEART:  Regular rhythm. EXTREMITIES:  Examination of left lower extremity reveals an ABI of 0.9. Pulses palpable surrounding a 1.7 x 2.0 superficial wound.  There is an area of redness and tenderness.  There is no pus or fluctuance.  The wound  appears clean at this time.  IMPRESSION:  Posttraumatic wound, probably some chronic venous hypertension.  PLAN OF TREATMENT:  We will start with silver collagen and use Profore Lite for some compression.  We will see her in 7 days.  She has been once removed the dressing if there is increased pain or she has fever, chills, or sweats.     Joanne Gavel, M.D.     RA/MEDQ  D:  01/09/2013  T:  01/10/2013  Job:  [id number]

## 2013-01-23 ENCOUNTER — Encounter (HOSPITAL_BASED_OUTPATIENT_CLINIC_OR_DEPARTMENT_OTHER): Payer: Medicare Other | Attending: General Surgery

## 2013-01-23 DIAGNOSIS — I87319 Chronic venous hypertension (idiopathic) with ulcer of unspecified lower extremity: Secondary | ICD-10-CM | POA: Insufficient documentation

## 2013-01-23 DIAGNOSIS — L97909 Non-pressure chronic ulcer of unspecified part of unspecified lower leg with unspecified severity: Secondary | ICD-10-CM | POA: Insufficient documentation

## 2013-02-01 ENCOUNTER — Other Ambulatory Visit: Payer: Self-pay | Admitting: Neurosurgery

## 2013-02-06 ENCOUNTER — Encounter (HOSPITAL_COMMUNITY): Payer: Self-pay | Admitting: Respiratory Therapy

## 2013-02-08 NOTE — Pre-Procedure Instructions (Signed)
Becky Mccall  02/08/2013   Your procedure is scheduled on:  Mon, Dec 29 @ 7:30 AM  Report to [facility] Short Stay Entrance A at 5:30 AM.  Call this number if you have problems the morning of surgery: [phone]   Remember:   Do not eat food or drink liquids after midnight.   Take these medicines the morning of surgery with A SIP OF WATER: Synthroid(Levothyroxine) and Spiriva<Bring Your Inhaler With You>              Stop taking your Aspirin,Fish Oil,and Aleve.No Goody's,BC's,Ibuprofen,and Herbal Medications   Do not wear jewelry, make-up or nail polish.  Do not wear lotions, powders, or perfumes. You may wear deodorant.  Do not shave 48 hours prior to surgery.   Do not bring valuables to the hospital.  [facility] is not responsible                  for any belongings or valuables.               Contacts, dentures or bridgework may not be worn into surgery.  Leave suitcase in the car. After surgery it may be brought to your room.  For patients admitted to the hospital, discharge time is determined by your                treatment team.               Special Instructions: Shower using CHG 2 nights before surgery and the night before surgery.  If you shower the day of surgery use CHG.  Use special wash - you have one bottle of CHG for all showers.  You should use approximately 1/3 of the bottle for each shower.   Please read over the following fact sheets that you were given: Pain Booklet, Coughing and Deep Breathing, Blood Transfusion Information, MRSA Information and Surgical Site Infection Prevention

## 2013-02-09 ENCOUNTER — Encounter (HOSPITAL_COMMUNITY)
Admission: RE | Admit: 2013-02-09 | Discharge: 2013-02-09 | Disposition: A | Discharge status: Home / Self Care | Payer: Medicare Other | Source: Ambulatory Visit | Attending: Neurosurgery | Admitting: Neurosurgery

## 2013-02-09 ENCOUNTER — Encounter (HOSPITAL_COMMUNITY): Payer: Self-pay

## 2013-02-09 DIAGNOSIS — Z01812 Encounter for preprocedural laboratory examination: Secondary | ICD-10-CM | POA: Insufficient documentation

## 2013-02-09 LAB — BASIC METABOLIC PANEL
BUN: 21 mg/dL (ref 6–23)
Chloride: 102 mEq/L (ref 96–112)
GFR calc Af Amer: 59 mL/min — ABNORMAL LOW (ref >90)
GFR calc non Af Amer: 51 mL/min — ABNORMAL LOW (ref >90)
Glucose, Bld: 122 mg/dL — ABNORMAL HIGH (ref 70–99)
Potassium: 4.8 mEq/L (ref 3.5–5.1)
Sodium: 137 mEq/L (ref 135–145)

## 2013-02-09 LAB — TYPE AND SCREEN
ABO/RH(D): O POS
Antibody Screen: NEGATIVE

## 2013-02-09 LAB — CBC
Hemoglobin: 13.4 g/dL (ref 12.0–15.0)
MCH: 32.2 pg (ref 26.0–34.0)
MCHC: 33.4 g/dL (ref 30.0–36.0)
RBC: 4.16 MIL/uL (ref 3.87–5.11)
RDW: 13.7 % (ref 11.5–15.5)

## 2013-02-09 LAB — SURGICAL PCR SCREEN: MRSA, PCR: NEGATIVE

## 2013-02-09 NOTE — Progress Notes (Signed)
Anesthesia follow-up: See my note from 01/01/13.  She had a low risk stress test on 01/04/13 and surgery was originally scheduled for 01/08/13, but rescheduled for 02/09/13 for unknown reasons.  Repeat labs noted.  If not acute changes then I would anticipate that she could proceed as planned.  Velna Ochs [facility] Short Stay Center/Anesthesiology Phone [phone] 02/09/2013 12:47 PM

## 2013-02-09 NOTE — Progress Notes (Addendum)
Cardiologist is Dr.Traci Turner with last visit in epic and clearance note  Denies ever having an echo or heart cath  Medical Md is Dr.Cynthia White    Stress test in epic from 2014  EKG and CXR in epic from 12-29-12

## 2013-02-18 MED ORDER — CEFAZOLIN SODIUM-DEXTROSE 2-3 GM-% IV SOLR
2.0000 g | INTRAVENOUS | Status: AC
  Administered 2013-02-19 (×2): 2 g via INTRAVENOUS
  Filled 2013-02-18: qty 50

## 2013-02-19 ENCOUNTER — Encounter (HOSPITAL_COMMUNITY): Payer: Medicare Other | Admitting: Vascular Surgery

## 2013-02-19 ENCOUNTER — Encounter (HOSPITAL_COMMUNITY)
Admission: RE | Disposition: A | Discharge status: Home Health | Payer: Medicare Other | Source: Ambulatory Visit | Attending: Neurosurgery

## 2013-02-19 ENCOUNTER — Inpatient Hospital Stay (HOSPITAL_COMMUNITY)
Admission: RE | Admit: 2013-02-19 | Discharge: 2013-02-25 | DRG: 458 | Disposition: A | Discharge status: Home Health | Payer: Medicare Other | Source: Ambulatory Visit | Attending: Neurosurgery | Admitting: Neurosurgery

## 2013-02-19 ENCOUNTER — Encounter (HOSPITAL_COMMUNITY): Payer: Self-pay | Admitting: *Deleted

## 2013-02-19 ENCOUNTER — Inpatient Hospital Stay (HOSPITAL_COMMUNITY): Payer: Medicare Other

## 2013-02-19 ENCOUNTER — Inpatient Hospital Stay (HOSPITAL_COMMUNITY): Payer: Medicare Other | Admitting: Certified Registered"

## 2013-02-19 DIAGNOSIS — Z79899 Other long term (current) drug therapy: Secondary | ICD-10-CM

## 2013-02-19 DIAGNOSIS — K5900 Constipation, unspecified: Secondary | ICD-10-CM | POA: Diagnosis not present

## 2013-02-19 DIAGNOSIS — E785 Hyperlipidemia, unspecified: Secondary | ICD-10-CM | POA: Diagnosis present

## 2013-02-19 DIAGNOSIS — J4489 Other specified chronic obstructive pulmonary disease: Secondary | ICD-10-CM | POA: Diagnosis present

## 2013-02-19 DIAGNOSIS — M4180 Other forms of scoliosis, site unspecified: Principal | ICD-10-CM | POA: Diagnosis present

## 2013-02-19 DIAGNOSIS — J449 Chronic obstructive pulmonary disease, unspecified: Secondary | ICD-10-CM | POA: Diagnosis present

## 2013-02-19 DIAGNOSIS — Q762 Congenital spondylolisthesis: Secondary | ICD-10-CM

## 2013-02-19 DIAGNOSIS — H9190 Unspecified hearing loss, unspecified ear: Secondary | ICD-10-CM | POA: Diagnosis present

## 2013-02-19 DIAGNOSIS — Z87891 Personal history of nicotine dependence: Secondary | ICD-10-CM

## 2013-02-19 DIAGNOSIS — I10 Essential (primary) hypertension: Secondary | ICD-10-CM | POA: Diagnosis present

## 2013-02-19 DIAGNOSIS — Z7982 Long term (current) use of aspirin: Secondary | ICD-10-CM

## 2013-02-19 DIAGNOSIS — K219 Gastro-esophageal reflux disease without esophagitis: Secondary | ICD-10-CM | POA: Diagnosis present

## 2013-02-19 DIAGNOSIS — M51379 Other intervertebral disc degeneration, lumbosacral region without mention of lumbar back pain or lower extremity pain: Secondary | ICD-10-CM | POA: Diagnosis present

## 2013-02-19 DIAGNOSIS — I359 Nonrheumatic aortic valve disorder, unspecified: Secondary | ICD-10-CM | POA: Diagnosis present

## 2013-02-19 DIAGNOSIS — M5137 Other intervertebral disc degeneration, lumbosacral region: Secondary | ICD-10-CM | POA: Diagnosis present

## 2013-02-19 DIAGNOSIS — Z961 Presence of intraocular lens: Secondary | ICD-10-CM

## 2013-02-19 DIAGNOSIS — M4802 Spinal stenosis, cervical region: Secondary | ICD-10-CM | POA: Diagnosis present

## 2013-02-19 DIAGNOSIS — Z9849 Cataract extraction status, unspecified eye: Secondary | ICD-10-CM

## 2013-02-19 DIAGNOSIS — R1310 Dysphagia, unspecified: Secondary | ICD-10-CM | POA: Diagnosis present

## 2013-02-19 DIAGNOSIS — Z888 Allergy status to other drugs, medicaments and biological substances status: Secondary | ICD-10-CM

## 2013-02-19 DIAGNOSIS — E039 Hypothyroidism, unspecified: Secondary | ICD-10-CM | POA: Diagnosis present

## 2013-02-19 SURGERY — POSTERIOR LUMBAR FUSION 3 LEVEL
Anesthesia: General | Site: Back

## 2013-02-19 MED ORDER — NAPROXEN 500 MG PO TABS
500.0000 mg | ORAL_TABLET | Freq: Two times a day (BID) | ORAL | Status: DC
  Administered 2013-02-20 – 2013-02-24 (×10): 500 mg via ORAL
  Filled 2013-02-19 (×15): qty 1

## 2013-02-19 MED ORDER — HYDROMORPHONE HCL PF 1 MG/ML IJ SOLN
0.5000 mg | INTRAMUSCULAR | Status: DC | PRN
  Administered 2013-02-19: 0.5 mg via INTRAVENOUS

## 2013-02-19 MED ORDER — 0.9 % SODIUM CHLORIDE (POUR BTL) OPTIME
TOPICAL | Status: DC | PRN
  Administered 2013-02-19: 1000 mL

## 2013-02-19 MED ORDER — HYDROMORPHONE HCL PF 1 MG/ML IJ SOLN
INTRAMUSCULAR | Status: AC
  Filled 2013-02-19: qty 1

## 2013-02-19 MED ORDER — VECURONIUM BROMIDE 10 MG IV SOLR
INTRAVENOUS | Status: DC | PRN
  Administered 2013-02-19 (×3): 1 mg via INTRAVENOUS
  Administered 2013-02-19: 2 mg via INTRAVENOUS

## 2013-02-19 MED ORDER — NEOSTIGMINE METHYLSULFATE 1 MG/ML IJ SOLN
INTRAMUSCULAR | Status: DC | PRN
  Administered 2013-02-19: 4 mg via INTRAVENOUS

## 2013-02-19 MED ORDER — OXYCODONE HCL 5 MG PO TABS: 5.0000 mg | ORAL_TABLET | Freq: Once | ORAL | Status: DC | PRN

## 2013-02-19 MED ORDER — OXYCODONE HCL 5 MG/5ML PO SOLN: 5.0000 mg | Freq: Once | ORAL | Status: DC | PRN

## 2013-02-19 MED ORDER — ACETAMINOPHEN 650 MG RE SUPP: 650.0000 mg | RECTAL | Status: DC | PRN

## 2013-02-19 MED ORDER — PHENOL 1.4 % MT LIQD: 1.0000 | OROMUCOSAL | Status: DC | PRN

## 2013-02-19 MED ORDER — CEFAZOLIN SODIUM 1-5 GM-% IV SOLN
1.0000 g | Freq: Three times a day (TID) | INTRAVENOUS | Status: AC
  Administered 2013-02-19 – 2013-02-20 (×2): 1 g via INTRAVENOUS
  Filled 2013-02-19 (×2): qty 50

## 2013-02-19 MED ORDER — DOCUSATE SODIUM 100 MG PO CAPS
100.0000 mg | ORAL_CAPSULE | Freq: Two times a day (BID) | ORAL | Status: DC
  Administered 2013-02-19 – 2013-02-25 (×12): 100 mg via ORAL
  Filled 2013-02-19 (×12): qty 1

## 2013-02-19 MED ORDER — OXYCODONE HCL 5 MG PO TABS
ORAL_TABLET | ORAL | Status: AC
  Filled 2013-02-19: qty 1

## 2013-02-19 MED ORDER — VITAMIN B-12 1000 MCG PO TABS
1000.0000 ug | ORAL_TABLET | Freq: Every day | ORAL | Status: DC
  Administered 2013-02-20 – 2013-02-25 (×6): 1000 ug via ORAL
  Filled 2013-02-19 (×7): qty 1

## 2013-02-19 MED ORDER — ONDANSETRON HCL 4 MG/2ML IJ SOLN: 4.0000 mg | INTRAMUSCULAR | Status: DC | PRN

## 2013-02-19 MED ORDER — DEXAMETHASONE SODIUM PHOSPHATE 10 MG/ML IJ SOLN
10.0000 mg | INTRAMUSCULAR | Status: AC
  Administered 2013-02-19: 10 mg via INTRAVENOUS
  Filled 2013-02-19: qty 1

## 2013-02-19 MED ORDER — CEFAZOLIN SODIUM-DEXTROSE 2-3 GM-% IV SOLR
INTRAVENOUS | Status: AC
  Administered 2013-02-21: 2 g via INTRAVENOUS
  Filled 2013-02-19: qty 50

## 2013-02-19 MED ORDER — SODIUM CHLORIDE 0.9 % IJ SOLN
3.0000 mL | Freq: Two times a day (BID) | INTRAMUSCULAR | Status: DC
  Administered 2013-02-19 – 2013-02-24 (×7): 3 mL via INTRAVENOUS

## 2013-02-19 MED ORDER — SODIUM CHLORIDE 0.9 % IR SOLN
Status: DC | PRN
  Administered 2013-02-19: 08:00:00

## 2013-02-19 MED ORDER — MEPERIDINE HCL 25 MG/ML IJ SOLN: 6.2500 mg | INTRAMUSCULAR | Status: DC | PRN

## 2013-02-19 MED ORDER — ARTIFICIAL TEARS OP OINT
TOPICAL_OINTMENT | OPHTHALMIC | Status: DC | PRN
  Administered 2013-02-19: 1 via OPHTHALMIC

## 2013-02-19 MED ORDER — TIOTROPIUM BROMIDE MONOHYDRATE 18 MCG IN CAPS
18.0000 ug | ORAL_CAPSULE | RESPIRATORY_TRACT | Status: DC
  Administered 2013-02-20 – 2013-02-21 (×2): 18 ug via RESPIRATORY_TRACT
  Filled 2013-02-19: qty 5

## 2013-02-19 MED ORDER — OXYCODONE-ACETAMINOPHEN 5-325 MG PO TABS
1.0000 | ORAL_TABLET | ORAL | Status: DC | PRN
Start: 2013-02-19 — End: 2013-02-25
  Administered 2013-02-19 – 2013-02-22 (×8): 2 via ORAL
  Administered 2013-02-25: 1 via ORAL
  Filled 2013-02-19: qty 2
  Filled 2013-02-19: qty 1
  Filled 2013-02-19 (×7): qty 2

## 2013-02-19 MED ORDER — BUPIVACAINE HCL (PF) 0.25 % IJ SOLN
INTRAMUSCULAR | Status: DC | PRN
  Administered 2013-02-19: 10 mL

## 2013-02-19 MED ORDER — WHITE PETROLATUM GEL
Status: AC
  Administered 2013-02-19: 19:00:00
  Filled 2013-02-19: qty 5

## 2013-02-19 MED ORDER — LEVOTHYROXINE SODIUM 137 MCG PO TABS
137.0000 ug | ORAL_TABLET | Freq: Every day | ORAL | Status: DC
  Administered 2013-02-20 – 2013-02-25 (×6): 137 ug via ORAL
  Filled 2013-02-19 (×7): qty 1

## 2013-02-19 MED ORDER — OXYCODONE HCL 5 MG/5ML PO SOLN: 5.0000 mg | Freq: Once | ORAL | Status: AC | PRN

## 2013-02-19 MED ORDER — SODIUM CHLORIDE 0.9 % IV SOLN
250.0000 mL | INTRAVENOUS | Status: DC
  Administered 2013-02-21: 250 mL via INTRAVENOUS

## 2013-02-19 MED ORDER — ADULT MULTIVITAMIN W/MINERALS CH
1.0000 | ORAL_TABLET | Freq: Every day | ORAL | Status: DC
  Administered 2013-02-20 – 2013-02-25 (×6): 1 via ORAL
  Filled 2013-02-19 (×7): qty 1

## 2013-02-19 MED ORDER — THROMBIN 20000 UNITS EX SOLR
CUTANEOUS | Status: DC | PRN
  Administered 2013-02-19 (×2): via TOPICAL

## 2013-02-19 MED ORDER — ASPIRIN EC 81 MG PO TBEC
162.0000 mg | DELAYED_RELEASE_TABLET | Freq: Every day | ORAL | Status: DC
  Administered 2013-02-20 – 2013-02-25 (×6): 162 mg via ORAL
  Filled 2013-02-19 (×7): qty 2

## 2013-02-19 MED ORDER — VITAMIN C 500 MG PO TABS
500.0000 mg | ORAL_TABLET | Freq: Every day | ORAL | Status: DC
  Administered 2013-02-20 – 2013-02-25 (×6): 500 mg via ORAL
  Filled 2013-02-19 (×7): qty 1

## 2013-02-19 MED ORDER — MIDAZOLAM HCL 5 MG/5ML IJ SOLN
INTRAMUSCULAR | Status: DC | PRN
  Administered 2013-02-19 (×2): 1 mg via INTRAVENOUS

## 2013-02-19 MED ORDER — ROCURONIUM BROMIDE 100 MG/10ML IV SOLN
INTRAVENOUS | Status: DC | PRN
  Administered 2013-02-19: 20 mg via INTRAVENOUS
  Administered 2013-02-19: 60 mg via INTRAVENOUS
  Administered 2013-02-19: 20 mg via INTRAVENOUS

## 2013-02-19 MED ORDER — LIDOCAINE HCL (CARDIAC) 20 MG/ML IV SOLN
INTRAVENOUS | Status: DC | PRN
  Administered 2013-02-19: 100 mg via INTRAVENOUS

## 2013-02-19 MED ORDER — GLYCOPYRROLATE 0.2 MG/ML IJ SOLN
INTRAMUSCULAR | Status: DC | PRN
  Administered 2013-02-19: 0.6 mg via INTRAVENOUS

## 2013-02-19 MED ORDER — ONDANSETRON HCL 4 MG/2ML IJ SOLN: 4.0000 mg | Freq: Once | INTRAMUSCULAR | Status: DC | PRN

## 2013-02-19 MED ORDER — HYDROMORPHONE HCL PF 1 MG/ML IJ SOLN
0.2500 mg | INTRAMUSCULAR | Status: DC | PRN
  Administered 2013-02-19 (×4): 0.5 mg via INTRAVENOUS

## 2013-02-19 MED ORDER — SODIUM CHLORIDE 0.9 % IJ SOLN
3.0000 mL | INTRAMUSCULAR | Status: DC | PRN
  Administered 2013-02-22 (×2): 3 mL via INTRAVENOUS

## 2013-02-19 MED ORDER — CYCLOBENZAPRINE HCL 10 MG PO TABS
10.0000 mg | ORAL_TABLET | Freq: Three times a day (TID) | ORAL | Status: DC | PRN
  Administered 2013-02-19 – 2013-02-21 (×3): 10 mg via ORAL
  Filled 2013-02-19 (×4): qty 1

## 2013-02-19 MED ORDER — ALUM & MAG HYDROXIDE-SIMETH 200-200-20 MG/5ML PO SUSP: 30.0000 mL | Freq: Four times a day (QID) | ORAL | Status: DC | PRN

## 2013-02-19 MED ORDER — LACTATED RINGERS IV SOLN
INTRAVENOUS | Status: DC | PRN
  Administered 2013-02-19 (×4): via INTRAVENOUS

## 2013-02-19 MED ORDER — ONDANSETRON HCL 4 MG/2ML IJ SOLN
INTRAMUSCULAR | Status: DC | PRN
  Administered 2013-02-19: 4 mg via INTRAVENOUS

## 2013-02-19 MED ORDER — PROPOFOL 10 MG/ML IV BOLUS
INTRAVENOUS | Status: DC | PRN
  Administered 2013-02-19: 110 mg via INTRAVENOUS

## 2013-02-19 MED ORDER — OXYCODONE HCL 5 MG PO TABS
5.0000 mg | ORAL_TABLET | Freq: Once | ORAL | Status: AC | PRN
  Administered 2013-02-19: 5 mg via ORAL

## 2013-02-19 MED ORDER — FENTANYL CITRATE 0.05 MG/ML IJ SOLN
INTRAMUSCULAR | Status: DC | PRN
  Administered 2013-02-19: 100 ug via INTRAVENOUS
  Administered 2013-02-19 (×7): 50 ug via INTRAVENOUS

## 2013-02-19 MED ORDER — MENTHOL 3 MG MT LOZG: 1.0000 | LOZENGE | OROMUCOSAL | Status: DC | PRN

## 2013-02-19 MED ORDER — ACETAMINOPHEN 325 MG PO TABS: 650.0000 mg | ORAL_TABLET | ORAL | Status: DC | PRN

## 2013-02-19 MED ORDER — LIDOCAINE-EPINEPHRINE 1 %-1:100000 IJ SOLN
INTRAMUSCULAR | Status: DC | PRN
  Administered 2013-02-19: 10 mL

## 2013-02-19 MED ORDER — HYDROMORPHONE HCL PF 1 MG/ML IJ SOLN: 0.2500 mg | INTRAMUSCULAR | Status: DC | PRN

## 2013-02-19 MED ORDER — VITAMIN E 180 MG (400 UNIT) PO CAPS
400.0000 [IU] | ORAL_CAPSULE | Freq: Two times a day (BID) | ORAL | Status: DC
  Administered 2013-02-19 – 2013-02-25 (×12): 400 [IU] via ORAL
  Filled 2013-02-19 (×14): qty 1

## 2013-02-19 SURGICAL SUPPLY — 83 items
ADH SKN CLS APL DERMABOND .7 (GAUZE/BANDAGES/DRESSINGS) ×1
APL SKNCLS STERI-STRIP NONHPOA (GAUZE/BANDAGES/DRESSINGS) ×1
BAG DECANTER FOR FLEXI CONT (MISCELLANEOUS) ×2 IMPLANT
BENZOIN TINCTURE PRP APPL 2/3 (GAUZE/BANDAGES/DRESSINGS) ×2 IMPLANT
BLADE SURG 11 STRL SS (BLADE) ×2 IMPLANT
BLADE SURG ROTATE 9660 (MISCELLANEOUS) IMPLANT
BRUSH SCRUB EZ PLAIN DRY (MISCELLANEOUS) ×2 IMPLANT
BUR MATCHSTICK NEURO 3.0 LAGG (BURR) ×2 IMPLANT
BUR PRECISION FLUTE 6.0 (BURR) ×2 IMPLANT
CAGE CALIBER 8-12 0-12 (Cage) ×4 IMPLANT
CALIBER 9-13 12X26 (Neuro Prosthesis/Implant) ×4 IMPLANT
CANISTER SUCT 3000ML (MISCELLANEOUS) ×2 IMPLANT
CAP LOCKING THREADED (Cap) ×8 IMPLANT
CONT SPEC 4OZ CLIKSEAL STRL BL (MISCELLANEOUS) ×4 IMPLANT
COVER BACK TABLE 24X17X13 BIG (DRAPES) IMPLANT
COVER TABLE BACK 60X90 (DRAPES) ×2 IMPLANT
CROSSLINK SPINAL FUSION (Cage) ×1 IMPLANT
DECANTER SPIKE VIAL GLASS SM (MISCELLANEOUS) ×2 IMPLANT
DERMABOND ADVANCED (GAUZE/BANDAGES/DRESSINGS) ×1
DERMABOND ADVANCED .7 DNX12 (GAUZE/BANDAGES/DRESSINGS) ×1 IMPLANT
DRAPE C-ARM 42X72 X-RAY (DRAPES) ×4 IMPLANT
DRAPE LAPAROTOMY 100X72X124 (DRAPES) ×2 IMPLANT
DRAPE POUCH INSTRU U-SHP 10X18 (DRAPES) ×2 IMPLANT
DRAPE PROXIMA HALF (DRAPES) IMPLANT
DRAPE SURG 17X23 STRL (DRAPES) ×2 IMPLANT
DRSG OPSITE 4X5.5 SM (GAUZE/BANDAGES/DRESSINGS) ×2 IMPLANT
DRSG OPSITE POSTOP 4X6 (GAUZE/BANDAGES/DRESSINGS) ×1 IMPLANT
DRSG OPSITE POSTOP 4X8 (GAUZE/BANDAGES/DRESSINGS) ×1 IMPLANT
DURAPREP 26ML APPLICATOR (WOUND CARE) ×2 IMPLANT
ELECT REM PT RETURN 9FT ADLT (ELECTROSURGICAL) ×2
ELECTRODE REM PT RTRN 9FT ADLT (ELECTROSURGICAL) ×1 IMPLANT
EVACUATOR 3/16  PVC DRAIN (DRAIN) ×1
EVACUATOR 3/16 PVC DRAIN (DRAIN) ×1 IMPLANT
GAUZE SPONGE 4X4 16PLY XRAY LF (GAUZE/BANDAGES/DRESSINGS) ×2 IMPLANT
GLOVE BIO SURGEON STRL SZ8 (GLOVE) ×4 IMPLANT
GLOVE BIOGEL PI IND STRL 6.5 (GLOVE) ×1 IMPLANT
GLOVE BIOGEL PI IND STRL 7.0 (GLOVE) IMPLANT
GLOVE BIOGEL PI IND STRL 7.5 (GLOVE) IMPLANT
GLOVE BIOGEL PI IND STRL 8 (GLOVE) IMPLANT
GLOVE BIOGEL PI INDICATOR 6.5 (GLOVE) ×1
GLOVE BIOGEL PI INDICATOR 7.0 (GLOVE) ×3
GLOVE BIOGEL PI INDICATOR 7.5 (GLOVE) ×2
GLOVE BIOGEL PI INDICATOR 8 (GLOVE) ×1
GLOVE ECLIPSE 7.0 STRL STRAW (GLOVE) ×1 IMPLANT
GLOVE ECLIPSE 7.5 STRL STRAW (GLOVE) ×2 IMPLANT
GLOVE EXAM NITRILE LRG STRL (GLOVE) ×4 IMPLANT
GLOVE EXAM NITRILE MD LF STRL (GLOVE) IMPLANT
GLOVE EXAM NITRILE XL STR (GLOVE) IMPLANT
GLOVE EXAM NITRILE XS STR PU (GLOVE) IMPLANT
GLOVE INDICATOR 8.5 STRL (GLOVE) ×4 IMPLANT
GLOVE OPTIFIT SS 6.5 STRL BRWN (GLOVE) ×8 IMPLANT
GLOVE SURG SS PI 6.5 STRL IVOR (GLOVE) ×4 IMPLANT
GOWN BRE IMP SLV AUR LG STRL (GOWN DISPOSABLE) ×4 IMPLANT
GOWN BRE IMP SLV AUR XL STRL (GOWN DISPOSABLE) ×4 IMPLANT
GOWN STRL REIN 2XL LVL4 (GOWN DISPOSABLE) IMPLANT
KIT BASIN OR (CUSTOM PROCEDURE TRAY) ×2 IMPLANT
KIT INFUSE SMALL (Orthopedic Implant) ×1 IMPLANT
KIT ROOM TURNOVER OR (KITS) ×2 IMPLANT
MILL MEDIUM DISP (BLADE) ×2 IMPLANT
MIX DBX 10CC 35% BONE (Bone Implant) ×1 IMPLANT
NDL HYPO 25X1 1.5 SAFETY (NEEDLE) ×1 IMPLANT
NEEDLE HYPO 25X1 1.5 SAFETY (NEEDLE) ×2 IMPLANT
NS IRRIG 1000ML POUR BTL (IV SOLUTION) ×2 IMPLANT
PACK LAMINECTOMY NEURO (CUSTOM PROCEDURE TRAY) ×2 IMPLANT
PAD ARMBOARD 7.5X6 YLW CONV (MISCELLANEOUS) ×6 IMPLANT
PATTIES SURGICAL 1X1 (DISPOSABLE) ×2 IMPLANT
ROD CREO 100MM SPINAL (Rod) ×4 IMPLANT
SCREW AMP MODULAR CREO 6.5X45 (Screw) ×14 IMPLANT
SCREW CREO 6.5X40 (Screw) ×1 IMPLANT
SCREW PA THRD CREO TULIP 5.5X4 (Head) ×8 IMPLANT
SPONGE GAUZE 4X4 12PLY (GAUZE/BANDAGES/DRESSINGS) ×2 IMPLANT
SPONGE LAP 4X18 X RAY DECT (DISPOSABLE) IMPLANT
SPONGE SURGIFOAM ABS GEL 100 (HEMOSTASIS) ×2 IMPLANT
STRIP CLOSURE SKIN 1/2X4 (GAUZE/BANDAGES/DRESSINGS) ×3 IMPLANT
SUT VIC AB 0 CT1 18XCR BRD8 (SUTURE) ×2 IMPLANT
SUT VIC AB 0 CT1 8-18 (SUTURE) ×4
SUT VIC AB 2-0 CT1 18 (SUTURE) ×4 IMPLANT
SUT VICRYL 4-0 PS2 18IN ABS (SUTURE) ×2 IMPLANT
SYR 20ML ECCENTRIC (SYRINGE) ×2 IMPLANT
TOWEL OR 17X24 6PK STRL BLUE (TOWEL DISPOSABLE) ×2 IMPLANT
TOWEL OR 17X26 10 PK STRL BLUE (TOWEL DISPOSABLE) ×2 IMPLANT
TRAY FOLEY CATH 14FRSI W/METER (CATHETERS) ×2 IMPLANT
WATER STERILE IRR 1000ML POUR (IV SOLUTION) ×2 IMPLANT

## 2013-02-19 NOTE — Anesthesia Preprocedure Evaluation (Addendum)
Anesthesia Evaluation  Patient identified by MRN, date of birth, ID band Patient awake    Reviewed: Allergy & Precautions, H&P , NPO status , Patient's Chart, lab work & pertinent test results  Airway Mallampati: I TM Distance: >3 FB Neck ROM: Full    Dental  (+) Edentulous Lower and Edentulous Upper   Pulmonary COPDformer smoker,          Cardiovascular hypertension, Pt. on medications     Neuro/Psych    GI/Hepatic GERD-  Medicated and Controlled,  Endo/Other  Hypothyroidism   Renal/GU      Musculoskeletal   Abdominal   Peds  Hematology   Anesthesia Other Findings   Reproductive/Obstetrics                          Anesthesia Physical Anesthesia Plan  ASA: II  Anesthesia Plan: General   Post-op Pain Management:    Induction: Intravenous  Airway Management Planned: Oral ETT  Additional Equipment:   Intra-op Plan:   Post-operative Plan: Extubation in OR  Informed Consent: I have reviewed the patients History and Physical, chart, labs and discussed the procedure including the risks, benefits and alternatives for the proposed anesthesia with the patient or authorized representative who has indicated his/her understanding and acceptance.     Plan Discussed with: CRNA and Surgeon  Anesthesia Plan Comments:         Anesthesia Quick Evaluation

## 2013-02-19 NOTE — Progress Notes (Signed)
Dr. Wynetta Emery notified that O2 sats. drop to high 80's after pain meds. , goes back up to 94-96 after taking deep breaths. Will continue to monitor.

## 2013-02-19 NOTE — Transfer of Care (Signed)
Immediate Anesthesia Transfer of Care Note  Patient: Becky Mccall  Procedure(s) Performed: Procedure(s) with comments: POSTERIOR LUMBAR FUSION 3 LEVEL (N/A) - POSTERIOR LUMBAR FUSION 3 LEVEL  Patient Location: PACU  Anesthesia Type:General  Level of Consciousness: awake, alert  and oriented  Airway & Oxygen Therapy: Patient Spontanous Breathing and Patient connected to nasal cannula oxygen  Post-op Assessment: Report given to PACU RN, Post -op Vital signs reviewed and stable and Patient moving all extremities X 4  Post vital signs: Reviewed and stable  Complications: No apparent anesthesia complications

## 2013-02-19 NOTE — Progress Notes (Signed)
UR completed.  Ples Trudel, RN BSN [facility] CCM Trauma/Neuro ICU Case Manager [phone]  

## 2013-02-19 NOTE — Op Note (Signed)
Preoperative diagnosis: Severe lumbar spinal stenosis and severe foraminal stenosis L2-3, L3-4, L4-5 severe degenerative disc disease and degenerative scoliosis L2-3 L3-4 L4-5 bilateral L3-L4 and L5 radiculopathies with grade 1 spondylolisthesis L2-3  Postoperative diagnosis: Same  Procedure: #1 redo decompressive lumbar laminectomy L3-4 complete medial facetectomies and foraminotomies in excess and requiring more work than a standard interbody fusion  #2 decompressive lumbar laminectomies L2-3 L4-5 in excess requiring more work with a standard interbody fusion with complete medial facetectomies and radical foraminotomies  #3 posterior lumbar interbody fusion L2-3 L3-4 L4-5 using the globus caliber expandable peek cages packed with local autograft mixed with DBX and BMP  #4 pedicle screw fixation L2-L5 using the globus Creo modular 5.5 pedicle screw system  #5 posterior lateral arthrodesis L2-L5 using local autograft mixed with DBX and BMP  #6 open reduction of spinal deformity  #7 placement of large Hemovac drain  Surgeon: Jillyn Hidden Abiha Lukehart  Asst.:Neelesh Nundkumar  Anesthesia: Gen.  EBL: 600  History of present illness: Patient is a very pleasant 79 year old female is a progress worsening a long-standing back and bilateral leg pain with radicular pain and neurogenic claudication consistent with severe stenosis at L2-3 L3-4 L4-5. Workup revealed severe degenerative disc disease and degenerative scoliosis grade 1 spondylolisthesis and severe foraminal stenosis at all 3 levels. Due to his failure conservative treatment imaging findings and progression of clinical syndrome I recommended decompression stabilization procedure at L2-3, L3-4, L4-5 I extensively reviewed the risks and benefits of the operation the patient as well as perioperative course and expectations of outcome alternatives of surgery she understood and agreed to proceed forward.  Operative procedure: Patient brought into the or was  induced under general anesthesia positioned prone the Wilson frame her back was prepped and draped in routine sterile fashion. Her old incision was opened up and extended cephalad caudally the scar tissue was dissected free and subperiosteal dissections care lamina of L234 and 5 bilaterally exposing the TPS at L2-3 and 4 bilaterally. Extensive scar tissue is present at L3-4 extending up in the inferior aspect of L2-3 the super aspect of L4-5. After the facet joints was identified the medial facet joint then drilled down central decompression was begun at L2-3 and L4-5 working both caudally from the cephalad decompression and rostrally from the caudal decompression complete central decompression was completed complete medial facetectomies performed at L2-3, L3-4, L4-5. There was severe facet arthropathy and marked spinal stenosis with hourglass compression of thecal sac and severe foraminal stenosis at all levels the after adequate decompression achieved and complete medial facetectomies were performed radical foraminotomies all the nerve roots decompressed. Then the attention was first taken to the placement of pedicle screws using a high-speed drill pilot holes were drilled under fluoroscopic guidance and using both internal and external bony landmarks all pedicles were probed tapped and Probed again and 6 5 x 45 screws were inserted all levels except for L3 on the right. This is a 6 5 x 40 screw all screws excellent purchase. After all screws in place stents taken the interbody work interspace was incised bilaterally with 11 blade scalpel first working on the left act as were inserted on the right with initially a 78 and 9 distractor sequentially stepped up on the right at L2-3 I felt 8 mm lordotic graft was appropriate sizing for L2-3 Cindy spaces cleanout was size 10 rotating cutter and straight curettes pituitary rongeurs. After adequate displaced and cleaned out an 8 mm expandable peek cage packable local are  graft mixed  DBX was inserted and opened up lordotic leg to approximately 12 lordosis. This significantly reduced the deformity due to spondylosis he says at this level the distractors removed and taken at L3-4 and L4-5 in a similar fashion 10 distractors were placed the spaces cleanout endplates were scraped with a size 7 and size 9 rotating cutters Epstein curettes and 9 expandable to 12 mm cages were placed at 12 lordosis. All cages were packed with local are graft mixed DBX all cages were expanded approximately 4-5 turns opened up to approximately 11 mm. Again the significant opened up the disc space and decompress the foramina. After a left-sided implants been achieved and placed the right-sided grafts were then placed with apparent. Local autograft mixed with DBX and BMP was packed centrally after adequate endplate preparation of the right-sided cages were inserted. This cages were also packed with local autograft mixed with he and DBX. Posterior fluoroscopy after screw and implant placement showed significant reduction of her scoliotic deformity as well as the reduction of the grade 1 spondylolisthesis. All screws appear to be in good position the wound scope was irrigated meticulous hemostasis was maintained aggressive decortication was care MTPs or lateral gutters local are graft was intact posterior laterally the head were then assembled onto the screws the screws were further advanced with tweaker. Then 200 mm rods were selected top tightness were tightened down the L4-5 and L3-4 disc space were compressed on the right to further reduce the scoliosis and L2-3 was compressed bilaterally to help reduce the kyphosis. After all the rods and assembled the cross-link was applied all foraminal reinspected confirm patency no migration of graft material a drain was placed and the wounds closed in layers with after Vicryl and skin was) 4 subcuticular benzoin Dermabond Steri-Strips and a dressing was applied  patient recovered in stable condition. At the end of case on it counts sponge counts were correct.

## 2013-02-19 NOTE — Plan of Care (Signed)
Problem: Consults Goal: Diagnosis - Spinal Surgery Outcome: Completed/Met Date Met:  02/19/13 Lumbar Fusion

## 2013-02-19 NOTE — Anesthesia Procedure Notes (Signed)
Procedure Name: Intubation Date/Time: 02/19/2013 7:33 AM Performed by: Lanell Matar Pre-anesthesia Checklist: Patient identified, Timeout performed, Emergency Drugs available, Suction available and Patient being monitored Patient Re-evaluated:Patient Re-evaluated prior to inductionOxygen Delivery Method: Circle system utilized Preoxygenation: Pre-oxygenation with 100% oxygen Intubation Type: IV induction Ventilation: Mask ventilation without difficulty and Oral airway inserted - appropriate to patient size Laryngoscope Size: Hyacinth Meeker and 2 Grade View: Grade I Tube type: Oral Tube size: 7.0 mm Number of attempts: 1 Airway Equipment and Method: Stylet Placement Confirmation: ETT inserted through vocal cords under direct vision,  positive ETCO2,  CO2 detector and breath sounds checked- equal and bilateral Secured at: 22 cm Tube secured with: Tape Dental Injury: Teeth and Oropharynx as per pre-operative assessment

## 2013-02-19 NOTE — Anesthesia Postprocedure Evaluation (Signed)
Anesthesia Post Note  Patient: Becky Mccall  Procedure(s) Performed: Procedure(s) (LRB): POSTERIOR LUMBAR FUSION 3 LEVEL (N/A)  Anesthesia type: general  Patient location: PACU  Post pain: Pain level controlled  Post assessment: Patient's Cardiovascular Status Stable  Last Vitals:  Filed Vitals:   02/19/13 1515  BP:   Pulse: 102  Temp: 36.2 C  Resp: 15    Post vital signs: Reviewed and stable  Level of consciousness: sedated  Complications: No apparent anesthesia complications

## 2013-02-19 NOTE — H&P (Signed)
Becky Mccall is an 79 y.o. female.   Chief Complaint: Back and bilateral leg pain left greater right HPI: Patient is an 79 year old female who has had progressive worsening back and bilateral leg pain with neurogenic claudication with predominately back pain secondary radiculopathy radiating down the anterior and medial shin as well as anterior medial thigh. She is only able to walk for very short distances this is done progressively worse and is been refractory to all forms of conservative treatment physical therapy anti-inflammatories epidural steroid injections and narcotic pain management. Due to her significantly declining quality of life secondary to pain for a conservative treatment imaging findings showing severe spinal stenosis at L2-3, L3-4, and L4-5 as well as severe degenerative scoliosis and degenerative disc disease I recommended decompression stabilization procedure at L2-3 L3-4 L4-5. I have extensively reviewed the risks and benefits of the operation with the patient as well as perioperative course and expectations of outcome and alternatives surgery and she understood and agreed to proceed forward.  Past Medical History  Diagnosis Date  . Hard of hearing     wears hearing aides both ears  . Intermittent dysphagia     for pills and solids  . Arthritis   . Hyperlipidemia     can't take the meds so takes Fish Oil  . Heart murmur     sees Dr Armanda Magic  . Abrasion of left lower extremity 12/2012    draining wound on LLL;  . H/O heart failure 2004    evaluated by Dr Mayford Knife; no sequela  . Aortic stenosis     mild AR/AS by 10/05/12 echo (Dr. Armanda Magic)  . Hypertension     takes Losartan-HCTZ daily  . COPD (chronic obstructive pulmonary disease)     Spiriva inhaler daily  . Hypothyroidism     takes Synthroid daily  . GERD (gastroesophageal reflux disease)     occasionally Rolaids or Tums  . Shortness of breath     with exertion  . Chest congestion     was on ZPAK  and completed on 02/17/13    Past Surgical History  Procedure Laterality Date  . Lumbar laminectomy  1999  . Abdominal hysterectomy    . Hemorroidectomy    . Eye surgery    . Cataract extraction w/ intraocular lens  implant, bilateral  1999,2000  . Back surgery  2009    History reviewed. No pertinent family history. Social History:  reports that she has quit smoking. Her smoking use included Cigarettes. She has a 120 pack-year smoking history. She has never used smokeless tobacco. She reports that she drinks about 3.5 ounces of alcohol per week. She reports that she does not use illicit drugs.  Allergies:  Allergies  Allergen Reactions  . Statins     Muscle pain    Medications Prior to Admission  Medication Sig Dispense Refill  . aspirin EC 81 MG tablet Take 162 mg by mouth daily.      . Calcium Carb-Cholecalciferol (CALCIUM 1000 + D PO) Take 1 tablet by mouth 2 (two) times daily.      . Cholecalciferol (VITAMIN D) 2000 UNITS tablet Take 2,000 Units by mouth 3 (three) times daily.      . Cinnamon 500 MG capsule Take 500 mg by mouth 3 (three) times daily.      . fish oil-omega-3 fatty acids 1000 MG capsule Take 1 g by mouth 3 (three) times daily.      Marland Kitchen levothyroxine (SYNTHROID, LEVOTHROID) 137  MCG tablet Take 137 mcg by mouth daily before breakfast.      . losartan-hydrochlorothiazide (HYZAAR) 100-25 MG per tablet Take 1 tablet by mouth daily.      . Magnesium 500 MG TABS Take 2 tablets by mouth daily.      . Multiple Vitamin (MULTIVITAMIN WITH MINERALS) TABS tablet Take 1 tablet by mouth daily.      . naproxen (NAPROSYN) 500 MG tablet Take 500 mg by mouth 2 (two) times daily with a meal.      . Pyridoxine HCl (VITAMIN B-6 PO) Take by mouth.      . tiotropium (SPIRIVA) 18 MCG inhalation capsule Place 18 mcg into inhaler and inhale daily.      . vitamin B-12 (CYANOCOBALAMIN) 1000 MCG tablet Take 1,000 mcg by mouth daily.      . vitamin C (ASCORBIC ACID) 500 MG tablet Take 500 mg  by mouth daily.      . vitamin E 400 UNIT capsule Take 400 Units by mouth 2 (two) times daily.        No results found for this or any previous visit (from the past 48 hour(s)). No results found.  Review of Systems  Constitutional: Negative.   HENT: Negative.   Eyes: Negative.   Respiratory: Negative.   Cardiovascular: Negative.   Gastrointestinal: Negative.   Genitourinary: Negative.   Musculoskeletal: Positive for back pain, joint pain and myalgias.  Skin: Negative.   Neurological: Positive for tremors and sensory change.  Psychiatric/Behavioral: Negative.     Blood pressure 165/62, pulse 82, temperature 97.4 F (36.3 C), temperature source Oral, resp. rate 20, SpO2 98.00%. Physical Exam  Constitutional: She is oriented to person, place, and time. She appears well-developed and well-nourished.  HENT:  Head: Normocephalic.  Eyes: Pupils are equal, round, and reactive to light.  Neck: Normal range of motion.  Respiratory: Effort normal.  GI: Soft.  Neurological: She is alert and oriented to person, place, and time. She has normal strength. GCS eye subscore is 4. GCS verbal subscore is 5. GCS motor subscore is 6.  Reflex Scores:      Patellar reflexes are 0 on the right side and 0 on the left side.      Achilles reflexes are 0 on the right side and 0 on the left side. Strength is 5 out of 5 in her iliopsoas, quads, hip she's, gaseous, into tibialis, and EHL.     Assessment/Plan Patient is 79 year old female presents for an L2-3 L3-4 and L4-5 posterior lumbar interbody fusion  Becky Mccall P 02/19/2013, 7:18 AM

## 2013-02-20 MED ORDER — CEFAZOLIN SODIUM-DEXTROSE 2-3 GM-% IV SOLR
2.0000 g | Freq: Three times a day (TID) | INTRAVENOUS | Status: AC
  Administered 2013-02-20 – 2013-02-24 (×12): 2 g via INTRAVENOUS
  Filled 2013-02-20 (×12): qty 50

## 2013-02-20 NOTE — Evaluation (Signed)
Physical Therapy Evaluation Patient Details Name: Becky Mccall MRN: [id number] DOB: 14-Apr-1929 Today's Date: 02/20/2013 Time: 4098-1191 PT Time Calculation (min): 28 min  PT Assessment / Plan / Recommendation History of Present Illness  Pt s/p PLIF x 3 levels.  Clinical Impression  Pt tolerating OOB mobility well for first time. Pt with mild onset of R LE pain at end of ambulation. Pt desires to go to SNF upon d/c for short period of time so she is not dependent on spouse at home. Pt reports "I think it'll be best for both of [place]. We aren't spring chickens you know." Suspect pt to be fine to d/c home with spouse and use of RW however pt feels uncomfortable returning home at this time. Acute PT to con't to follow to progress mobility and reassess d/c planning.    PT Assessment  Patient needs continued PT services    Follow Up Recommendations  Home health PT;Supervision/Assistance - 24 hour    Does the patient have the potential to tolerate intense rehabilitation      Barriers to Discharge   pt feels she would be better at a nursing home for a short period of time prior to going home    Equipment Recommendations  None recommended by PT    Recommendations for Other Services     Frequency Min 5X/week    Precautions / Restrictions Precautions Precautions: Back Precaution Booklet Issued: Yes (comment) Precaution Comments: pt with verbal understanding Required Braces or Orthoses: Spinal Brace Spinal Brace: Applied in sitting position;Lumbar corset Restrictions Weight Bearing Restrictions: No   Pertinent Vitals/Pain 5/10 R LE pain during ambulation      Mobility  Bed Mobility Bed Mobility: Rolling Left;Left Sidelying to Sit Rolling Left: 5: Supervision Left Sidelying to Sit: 5: Supervision;HOB flat Details for Bed Mobility Assistance: pt demo'd safe technique and reports "this is how i get up at home" increased time due to first time up OOB Transfers Transfers: Sit to  Stand;Stand to Sit Sit to Stand: 4: Min assist;With upper extremity assist;From bed Stand to Sit: 4: Min assist;With upper extremity assist;To chair/3-in-1 Details for Transfer Assistance: v/c's for hand placement, increased time Ambulation/Gait Ambulation/Gait Assistance: 4: Min assist Ambulation Distance (Feet): 100 Feet Assistive device: Rolling walker Ambulation/Gait Assistance Details: slow but steady, onset of R LE pain by end of ambulation. no episodes of LOB, good walker management Gait Pattern: Step-through pattern;Decreased stride length Gait velocity: slow Stairs: No    Exercises     PT Diagnosis: Difficulty walking;Acute pain  PT Problem List: Decreased strength;Decreased activity tolerance;Decreased balance;Decreased mobility PT Treatment Interventions: DME instruction;Gait training;Stair training;Functional mobility training;Therapeutic activities;Therapeutic exercise     PT Goals(Current goals can be found in the care plan section) Acute Rehab PT Goals Patient Stated Goal: to get better PT Goal Formulation: With patient Time For Goal Achievement: 02/27/13 Potential to Achieve Goals: Good  Visit Information  Last PT Received On: 02/20/13 Assistance Needed: +1 History of Present Illness: Pt s/p PLIF x 3 levels.       Prior Functioning  Home Living Family/patient expects to be discharged to:: Private residence Living Arrangements: Spouse/significant other Available Help at Discharge: Family;Available 24 hours/day Type of Home: House Home Access: Stairs to enter [employer] of Steps: 1 Entrance Stairs-Rails: Right Home Layout: One level Home Equipment: Walker - 2 wheels;Walker - 4 wheels Additional Comments: pt reports "I'm going to nursing home for 10 days until I get better."  Prior Function Level of Independence: Independent Comments:  was using RW PTA due to R LE pain Communication Communication: No difficulties Dominant Hand: Right     Cognition  Cognition Arousal/Alertness: Awake/alert Behavior During Therapy: WFL for tasks assessed/performed Overall Cognitive Status: Within Functional Limits for tasks assessed    Extremity/Trunk Assessment Upper Extremity Assessment Upper Extremity Assessment: Overall WFL for tasks assessed Lower Extremity Assessment Lower Extremity Assessment: Generalized weakness (due to pain) Cervical / Trunk Assessment Cervical / Trunk Assessment:  (recent back surgery)   Balance    End of Session PT - End of Session Equipment Utilized During Treatment: Gait belt;Back brace Activity Tolerance: Patient tolerated treatment well Patient left: in chair;with call bell/phone within reach Nurse Communication: Mobility status  GP     Marcene Brawn 02/20/2013, 11:05 AM  Lewis Shock, PT, DPT Pager #: [phone] Office #: [phone]

## 2013-02-20 NOTE — Progress Notes (Signed)
Subjective: Patient reports Overall she's doing well this morning she did have a rasp a rough night last night with difficulty sleeping. no leg pain back soreness improvement  Objective: Vital signs in last 24 hours: Temp:  [97.2 F (36.2 C)-98.8 F (37.1 C)] 98.3 F (36.8 C) (12/30 0300) Pulse Rate:  [85-106] 85 (12/30 0500) Resp:  [0-22] 12 (12/30 0500) BP: (103-159)/(48-80) 122/48 mmHg (12/30 0500) SpO2:  [83 %-100 %] 100 % (12/30 0500) Weight:  [84.369 kg (186 lb)-85.2 kg (187 lb 13.3 oz)] 85.2 kg (187 lb 13.3 oz) (12/29 1530)  Intake/Output from previous day: 12/29 0701 - 12/30 0700 In: 4343 [I.V.:3903; Blood:340; IV Piggyback:100] Out: 2525 [Urine:935; Drains:490; Blood:1100] Intake/Output this shift: Total I/O In: 553 [I.V.:453; IV Piggyback:100] Out: 575 [Urine:355; Drains:220]  Awake alert oriented strength 5 out of 5  Lab Results: No results found for this basename: WBC, HGB, HCT, PLT,  in the last 72 hours BMET No results found for this basename: NA, K, CL, CO2, GLUCOSE, BUN, CREATININE, CALCIUM,  in the last 72 hours  Studies/Results: Dg Lumbar Spine 2-3 Views  02/19/2013   CLINICAL DATA:  Lumbar fusion  EXAM: LUMBAR SPINE - 2-3 VIEW  COMPARISON:  CT 12/05/2012  FINDINGS: 2 intraoperative fluoroscopic spot images document bilateral pedicle screw placement at L2, L3, L4, and L5. Interbody cages project in the intervening interspaces.  IMPRESSION: PLIF L2-L5.   Electronically Signed   By: Oley Balm M.D.   On: 02/19/2013 13:33    Assessment/Plan: Physical and occupational therapy today continue Foley 1 more day transferred to the floor  LOS: 1 day     Ishani Goldwasser P 02/20/2013, 6:11 AM

## 2013-02-20 NOTE — Progress Notes (Signed)
OT Cancellation Note  Patient Details Name: Becky Mccall MRN: [id number] DOB: 06-22-1929   Cancelled Treatment:    Reason Eval/Treat Not Completed: Patient at procedure or test/ unavailable Pt being transferred to floor. Will see in am [facility], OTR/L  [phone] 02/20/2013 02/20/2013, 5:05 PM

## 2013-02-21 MED ORDER — DM-GUAIFENESIN ER 30-600 MG PO TB12
1.0000 | ORAL_TABLET | Freq: Two times a day (BID) | ORAL | Status: DC
  Administered 2013-02-21 – 2013-02-25 (×8): 1 via ORAL
  Filled 2013-02-21 (×9): qty 1

## 2013-02-21 NOTE — Progress Notes (Signed)
CSW left a message with Clydie Braun [facility] [phone]) to see if the Pt could still be d/c's to [facility] if the [facility] would cover the SNF placement if HHPT was recommended.   CSW awaiting a call back.    Leron Croak [facility]  [address];  [phone] Phone: [phone]

## 2013-02-21 NOTE — Progress Notes (Signed)
Subjective: Patient reports She stood a since she's just had a fall and medially per preceding my rounding on her. She says she was standing at the commode or standing didn't move away from the walker her leg gave way she collapsed and hit her head denies any loss of consciousness denies any significant nausea or vomiting or dizziness both per before or after she is having globus soreness and headache right now. Otherwise she been doing well the numbness in her knees but her legs feel much better than it did preoperatively.  Objective: Vital signs in last 24 hours: Temp:  [97.4 F (36.3 C)-98.6 F (37 C)] 98.3 F (36.8 C) (12/31 1441) Pulse Rate:  [82-100] 100 (12/31 1441) Resp:  [18] 18 (12/31 1441) BP: (127-134)/(63-70) 127/70 mmHg (12/31 1441) SpO2:  [94 %-97 %] 96 % (12/31 1441)  Intake/Output from previous day: 12/30 0701 - 12/31 0700 In: 480 [Mccall.O.:480] Out: 3130 [Urine:2950; Drains:180] Intake/Output this shift: Total I/O In: -  Out: 400 [Urine:200; Drains:200]  Patient is awake alert oriented x4 strength out of 5 wound clean dry continue to mobilize with therapy patient will be stable for transfer to tandem place on Friday should that become available  Lab Results: No results found for this basename: WBC, HGB, HCT, PLT,  in the last 72 hours BMET No results found for this basename: NA, K, CL, CO2, GLUCOSE, BUN, CREATININE, CALCIUM,  in the last 72 hours  Studies/Results: No results found.  Assessment/Plan: Continue to work on physical and occupational therapy  LOS: 2 days     Becky Mccall 02/21/2013, 4:29 PM

## 2013-02-21 NOTE — Evaluation (Signed)
Occupational Therapy Evaluation Patient Details Name: Becky Mccall MRN: [id number] DOB: [birth year] Today's Date: 02/21/2013 Time: 4098-1191 OT Time Calculation (min): 22 min  OT Assessment / Plan / Recommendation History of present illness Pt s/p PLIF x 3 levels.   Clinical Impression   Pt presents with below problem list. Pt will benefit from acute OT to increase independence prior to d/c. Pt wanting to go to SNF, but pt moving well during session and suspect she will be fine to d/c home with spouse. Will continue to see and reassess d/c planning.     OT Assessment  Patient needs continued OT Services    Follow Up Recommendations  Home health OT;Supervision/Assistance - 24 hour    Barriers to Discharge      Equipment Recommendations  None recommended by OT    Recommendations for Other Services    Frequency  Min 2X/week    Precautions / Restrictions Precautions Precautions: Back Precaution Booklet Issued: Yes (comment) Precaution Comments: Pt able to state 2/3 precautions Required Braces or Orthoses: Spinal Brace Spinal Brace: Applied in sitting position;Lumbar corset   Pertinent Vitals/Pain Pain 3/10 after back in bed and 4/10 moving. Increased activity in session.     ADL  Eating/Feeding: Independent Where Assessed - Eating/Feeding: Chair Grooming: Set up;Supervision/safety Where Assessed - Grooming: Supported sitting Upper Body Bathing: Set up;Supervision/safety Where Assessed - Upper Body Bathing: Unsupported sitting Lower Body Bathing: Moderate assistance Where Assessed - Lower Body Bathing: Supported sit to stand Upper Body Dressing: Set up;Supervision/safety Where Assessed - Upper Body Dressing: Unsupported sitting Lower Body Dressing: Minimal assistance (with AE) Where Assessed - Lower Body Dressing: Supported sit to stand Toilet Transfer: Hydrographic surveyor Method: Sit to Barista: Comfort height toilet;Grab  bars Toileting - Architect and Hygiene: Minimal assistance Where Assessed - Engineer, mining and Hygiene: Sit to stand from 3-in-1 or toilet Tub/Shower Transfer Method: Not assessed Equipment Used: Gait belt;Back brace;Reacher;Rolling walker;Sock aid;Long-handled sponge;Long-handled shoe horn Transfers/Ambulation Related to ADLs: Min guard for ambulation and transfers. Cues/assist to move walker closer to her when going to sit on the bed. ADL Comments: Educated on use of two cups for teeth care and placing grooming items on right side of sink to avoid breaking precautions. Educated on donning brace as well as AE for LB ADLs.  Educated on use of toilet aid for hygiene.    OT Diagnosis: Acute pain  OT Problem List: Decreased range of motion;Decreased activity tolerance;Impaired balance (sitting and/or standing);Decreased knowledge of use of DME or AE;Decreased knowledge of precautions;Pain OT Treatment Interventions: Self-care/ADL training;DME and/or AE instruction;Therapeutic activities;Patient/family education;Balance training   OT Goals(Current goals can be found in the care plan section) Acute Rehab OT Goals Patient Stated Goal: less pain OT Goal Formulation: With patient Time For Goal Achievement: 02/28/13 Potential to Achieve Goals: Good ADL Goals Pt Will Perform Grooming: with modified independence;standing Pt Will Perform Lower Body Bathing: with modified independence;sit to/from stand;with adaptive equipment Pt Will Perform Lower Body Dressing: with modified independence;with adaptive equipment;sit to/from stand Pt Will Transfer to Toilet: with modified independence;ambulating;grab bars (comfort height toilet) Pt Will Perform Toileting - Clothing Manipulation and hygiene: with modified independence;sit to/from stand Pt Will Perform Tub/Shower Transfer: Tub transfer;with supervision;ambulating;tub bench;rolling walker Additional ADL Goal #1: Pt will  independently verbalize and demonstrate 3/3 back precautions.   Visit Information  Last OT Received On: 02/20/13 Assistance Needed: +1 History of Present Illness: Pt s/p PLIF x 3 levels.  Prior Functioning     Home Living Family/patient expects to be discharged to:: Private residence Living Arrangements: Spouse/significant other Available Help at Discharge: Family;Available 24 hours/day Type of Home: House Home Access: Stairs to enter [employer] of Steps: 1 Entrance Stairs-Rails: Right Home Layout: One level Home Equipment: Walker - 2 wheels;Walker - 4 wheels;Tub bench;Adaptive equipment Adaptive Equipment: Reacher Prior Function Level of Independence: Independent Comments: was using RW PTA due to R LE pain Communication Communication: No difficulties Dominant Hand: Right         Vision/Perception     Cognition  Cognition Arousal/Alertness: Awake/alert Behavior During Therapy: WFL for tasks assessed/performed Overall Cognitive Status: Within Functional Limits for tasks assessed    Extremity/Trunk Assessment Upper Extremity Assessment Upper Extremity Assessment: Overall WFL for tasks assessed Lower Extremity Assessment Lower Extremity Assessment: Defer to PT evaluation     Mobility Bed Mobility Bed Mobility: Rolling Left;Left Sidelying to Sit;Sit to Sidelying Left;Rolling Right Rolling Right: 5: Supervision Rolling Left: 5: Supervision;With rail Left Sidelying to Sit: 5: Supervision;HOB elevated Sit to Sidelying Left: 5: Supervision Details for Bed Mobility Assistance: Cues for technique Transfers Transfers: Sit to Stand;Stand to Sit Sit to Stand: 4: Min guard;With upper extremity assist;From bed;From toilet;From chair/3-in-1 Stand to Sit: 4: Min guard;To bed;To chair/3-in-1;To toilet Details for Transfer Assistance: Cues for technique.     Exercise     Balance     End of Session OT - End of Session Equipment Utilized During  Treatment: Gait belt;Rolling walker;Back brace Activity Tolerance: Patient tolerated treatment well Patient left: in bed;with call bell/phone within reach;with family/visitor present  GO     Earlie Raveling OTR/L [phone] 02/21/2013, 10:39 AM

## 2013-02-21 NOTE — Progress Notes (Signed)
Physical Therapy Treatment Patient Details Name: Becky Mccall MRN: [id number] DOB: 30-Apr-1929 Today's Date: 02/21/2013 Time: 4098-1191 PT Time Calculation (min): 39 min  PT Assessment / Plan / Recommendation  History of Present Illness Pt s/p PLIF x 3 levels.   PT Comments   Patient progressing well with ambulation and overall mobility. Increased time on education on brace wear, sitting schedule and back precautions this session  Follow Up Recommendations  Home health PT;Supervision/Assistance - 24 hour     Does the patient have the potential to tolerate intense rehabilitation     Barriers to Discharge        Equipment Recommendations       Recommendations for Other Services    Frequency Min 5X/week   Progress towards PT Goals Progress towards PT goals: Progressing toward goals  Plan Current plan remains appropriate    Precautions / Restrictions Precautions Precautions: Back Precaution Booklet Issued: Yes (comment) Precaution Comments: Patient reeducated on all precautions. Husband as well Required Braces or Orthoses: Spinal Brace Spinal Brace: Applied in sitting position;Lumbar corset   Pertinent Vitals/Pain 4/10 back pain. Premedicated     Mobility  Bed Mobility Bed Mobility: Right Sidelying to Sit Rolling Right: 5: Supervision Rolling Left: 5: Supervision;With rail Left Sidelying to Sit: 5: Supervision Sit to Sidelying Left: 5: Supervision Details for Bed Mobility Assistance: Cues for technique Transfers Sit to Stand: 4: Min guard;With upper extremity assist;From bed;From chair/3-in-1 Stand to Sit: 4: Min guard;To chair/3-in-1 Details for Transfer Assistance: Cues for technique. Ambulation/Gait Ambulation/Gait Assistance: 4: Min guard Ambulation Distance (Feet): 250 Feet Assistive device: Rolling walker Ambulation/Gait Assistance Details: Good safe use of RW. No LOB noted Gait Pattern: Step-through pattern;Decreased stride length    Exercises      PT Diagnosis:    PT Problem List:   PT Treatment Interventions:     PT Goals (current goals can now be found in the care plan section) Acute Rehab PT Goals Patient Stated Goal: less pain  Visit Information  Last PT Received On: 02/21/13 Assistance Needed: +1 History of Present Illness: Pt s/p PLIF x 3 levels.    Subjective Data  Patient Stated Goal: less pain   Cognition  Cognition Arousal/Alertness: Awake/alert Behavior During Therapy: WFL for tasks assessed/performed Overall Cognitive Status: Within Functional Limits for tasks assessed    Balance     End of Session PT - End of Session Equipment Utilized During Treatment: Gait belt;Back brace Activity Tolerance: Patient tolerated treatment well Patient left: in chair;with call bell/phone within reach Nurse Communication: Mobility status   GP     Fredrich Birks 02/21/2013, 11:12 AM 02/21/2013 Fredrich Birks PTA [phone] pager [phone] office

## 2013-02-21 NOTE — Progress Notes (Signed)
Family ran in hallway stating she needed help because her mother had just fallen. Pt found lying on her right side. Pt complained that she hit her head. BP 176/70 HR 106. MD on floor. MD assessed pt no new orders given. Pt does have small bruise to left elbow does not complain of any new or different pain. RN questioned family why bed alarm was not on. They stated the "girl in green" helped the pt to the edge of the bed at 1500 after taking her to the bathroom. Bed alarm must not have been turned back on. Family and pt stated they felt the pt was doing well enough that she did not need assistance so they did not call for help. Pt placed in bed bed alarm turned on family and pt reeducated to call for assistance and call button placed within reach.

## 2013-02-22 MED ORDER — TIOTROPIUM BROMIDE MONOHYDRATE 18 MCG IN CAPS
18.0000 ug | ORAL_CAPSULE | RESPIRATORY_TRACT | Status: DC
  Administered 2013-02-22 – 2013-02-24 (×3): 18 ug via RESPIRATORY_TRACT
  Filled 2013-02-22: qty 5

## 2013-02-22 MED ORDER — FLEET ENEMA 7-19 GM/118ML RE ENEM
1.0000 | ENEMA | Freq: Every day | RECTAL | Status: DC | PRN
  Administered 2013-02-23: 1 via RECTAL
  Filled 2013-02-22 (×2): qty 1

## 2013-02-22 MED ORDER — BISACODYL 10 MG RE SUPP
10.0000 mg | Freq: Every day | RECTAL | Status: DC | PRN
  Administered 2013-02-22: 10 mg via RECTAL
  Filled 2013-02-22: qty 1

## 2013-02-22 NOTE — Progress Notes (Signed)
Physical Therapy Treatment Patient Details Name: Becky Mccall MRN: [id number] DOB: 1929-07-06 Today's Date: 02/22/2013 Time: 6294-7654 PT Time Calculation (min): 25 min  PT Assessment / Plan / Recommendation  History of Present Illness Pt s/p PLIF x 3 levels.   PT Comments   Patient progressing with mobility. She did fall yesterday however showed no adverse affects with mobility this morning.   Follow Up Recommendations  Home health PT;Supervision/Assistance - 24 hour     Does the patient have the potential to tolerate intense rehabilitation     Barriers to Discharge        Equipment Recommendations  None recommended by PT    Recommendations for Other Services    Frequency Min 5X/week   Progress towards PT Goals Progress towards PT goals: Progressing toward goals  Plan Current plan remains appropriate    Precautions / Restrictions Precautions Precautions: Back Precaution Comments: Paitent able to recall 2/3 precautions. Cues for no twisting Required Braces or Orthoses: Spinal Brace Spinal Brace: Applied in sitting position;Lumbar corset   Pertinent Vitals/Pain no apparent distress    Mobility  Bed Mobility Rolling Right: 5: Supervision Right Sidelying to Sit: 5: Supervision Details for Bed Mobility Assistance: Cues for technique Transfers Sit to Stand: 4: Min guard;With upper extremity assist;From bed;From chair/3-in-1 Stand to Sit: 4: Min guard;To chair/3-in-1 Details for Transfer Assistance: Cues for technique. Ambulation/Gait Ambulation/Gait Assistance: 4: Min guard Ambulation Distance (Feet): 300 Feet Assistive device: Rolling walker Gait Pattern: Step-through pattern;Decreased stride length    Exercises     PT Diagnosis:    PT Problem List:   PT Treatment Interventions:     PT Goals (current goals can now be found in the care plan section)    Visit Information  Last PT Received On: 02/22/13 Assistance Needed: +1 History of Present Illness: Pt  s/p PLIF x 3 levels.    Subjective Data      Cognition  Cognition Arousal/Alertness: Awake/alert Behavior During Therapy: WFL for tasks assessed/performed Overall Cognitive Status: Within Functional Limits for tasks assessed    Balance     End of Session PT - End of Session Equipment Utilized During Treatment: Gait belt;Back brace Activity Tolerance: Patient tolerated treatment well Patient left: in chair;with call bell/phone within reach Nurse Communication: Mobility status   GP     Jacqualyn Posey 02/22/2013, 8:14 AM  02/22/2013 Jacqualyn Posey PTA [phone] pager [phone] office

## 2013-02-22 NOTE — Progress Notes (Signed)
Patient ID: Becky Mccall, female   DOB: 08-Nov-1929, 80 y.o.   MRN: [id number] Subjective:  The patient is alert and pleasant. She complains of constipation. She does not feel she is ready to go home and is awaiting placement at [facility].  Objective: Vital signs in last 24 hours: Temp:  [98 F (36.7 C)-99.3 F (37.4 C)] 98.7 F (37.1 C) (01/01 0938) Pulse Rate:  [80-100] 83 (01/01 0938) Resp:  [18-20] 20 (01/01 0938) BP: (127-159)/(53-74) 137/56 mmHg (01/01 0938) SpO2:  [93 %-99 %] 94 % (01/01 0938)  Intake/Output from previous day: 12/31 0701 - 01/01 0700 In: -  Out: 551 [Urine:201; Drains:350] Intake/Output this shift:    Physical exam the patient is alert and oriented. Her strength is grossly normal in her lower extremities.  Lab Results: No results found for this basename: WBC, HGB, HCT, PLT,  in the last 72 hours BMET No results found for this basename: NA, K, CL, CO2, GLUCOSE, BUN, CREATININE, CALCIUM,  in the last 72 hours  Studies/Results: No results found.  Assessment/Plan: Postop day #3: We will continue to mobilize her with PT and await skilled nursing facility placement.  Constipation: I will add a Dulcolax suppository/fleets enema.  LOS: 3 days     Becky Mccall D 02/22/2013, 10:07 AM

## 2013-02-23 MED ORDER — GABAPENTIN 300 MG PO CAPS
300.0000 mg | ORAL_CAPSULE | Freq: Two times a day (BID) | ORAL | Status: DC
  Administered 2013-02-23 – 2013-02-25 (×5): 300 mg via ORAL
  Filled 2013-02-23 (×6): qty 1

## 2013-02-23 MED ORDER — DEXAMETHASONE SODIUM PHOSPHATE 10 MG/ML IJ SOLN
10.0000 mg | Freq: Four times a day (QID) | INTRAMUSCULAR | Status: AC
  Administered 2013-02-23 – 2013-02-24 (×3): 10 mg via INTRAVENOUS
  Filled 2013-02-23 (×3): qty 1

## 2013-02-23 NOTE — Progress Notes (Signed)
Physical Therapy Treatment Patient Details Name: Becky Mccall MRN: [id number] DOB: [birth year] Today's Date: 02/23/2013 Time: 0258-5277 PT Time Calculation (min): 24 min  PT Assessment / Plan / Recommendation  History of Present Illness Pt s/p PLIF x 3 levels.   PT Comments   Patient moving a little slowly today due to fear of L knee buckling with gait. Patient able to tolerate stair training and increased ambulation.   Follow Up Recommendations  Home health PT;Supervision/Assistance - 24 hour     Does the patient have the potential to tolerate intense rehabilitation     Barriers to Discharge        Equipment Recommendations  None recommended by PT    Recommendations for Other Services    Frequency Min 5X/week   Progress towards PT Goals Progress towards PT goals: Progressing toward goals  Plan Current plan remains appropriate    Precautions / Restrictions Precautions Precautions: Back Precaution Comments: Patietn reeducated on all precautions Required Braces or Orthoses: Spinal Brace Spinal Brace: Applied in sitting position;Lumbar corset   Pertinent Vitals/Pain no apparent distress     Mobility  Bed Mobility Rolling Right: 6: Modified independent (Device/Increase time) Right Sidelying to Sit: 6: Modified independent (Device/Increase time) Transfers Sit to Stand: 5: Supervision;From bed Stand to Sit: 5: Supervision;To chair/3-in-1 Details for Transfer Assistance: Cues for technique. Ambulation/Gait Ambulation/Gait Assistance: 5: Supervision Ambulation Distance (Feet): 400 Feet Assistive device: Rolling walker Ambulation/Gait Assistance Details: Patient with heavier reliance on RW this session. Guarded due to fear of L knee buckling Gait Pattern: Step-through pattern;Decreased stride length Stairs: Yes Stairs Assistance: 4: Min guard Stair Management Technique: Step to pattern;Forwards Number of Stairs: 1 (practiced one step twice)    Exercises     PT  Diagnosis:    PT Problem List:   PT Treatment Interventions:     PT Goals (current goals can now be found in the care plan section)    Visit Information  Last PT Received On: 02/23/13 Assistance Needed: +1 History of Present Illness: Pt s/p PLIF x 3 levels.    Subjective Data      Cognition  Cognition Arousal/Alertness: Awake/alert Behavior During Therapy: WFL for tasks assessed/performed Overall Cognitive Status: Within Functional Limits for tasks assessed    Balance     End of Session PT - End of Session Equipment Utilized During Treatment: Gait belt;Back brace Activity Tolerance: Patient tolerated treatment well Patient left: in chair;with call bell/phone within reach Nurse Communication: Mobility status   GP     Jacqualyn Posey 02/23/2013, 8:18 AM 02/23/2013 Jacqualyn Posey PTA [phone] pager [phone] office

## 2013-02-23 NOTE — Progress Notes (Signed)
Clinical Social Work Department BRIEF PSYCHOSOCIAL ASSESSMENT 02/23/2013  Patient:  Becky Mccall, Becky Mccall     Account Number:  [id number]     Admit date:  02/19/2013  Clinical Social Worker:  Pete Pelt, CLINICAL SOCIAL WORKER  Date/Time:  02/23/2013 10:36 AM  Referred by:  Physician  Date Referred:  02/21/2013 Referred for  SNF Placement   Other Referral:   Interview type:  Patient Other interview type:    PSYCHOSOCIAL DATA Living Status:  HUSBAND Admitted from facility:   Level of care:   Primary support name:  Becky Mccall [phone] Primary support relationship to patient:  SPOUSE Degree of support available:   Pt has some support at home however stated she would feel more comfortable going to [facility].    CURRENT CONCERNS Current Concerns  Post-Acute Placement   Other Concerns:    SOCIAL WORK ASSESSMENT / PLAN CSW met with the Pt att he bedside to discuss d/c planning. Pt is alert and oriented x4. CSW introduced self and informed Pt that the PT recommendation was for HHPT and that CSW was informed by nursing that Pt would like to go to [facility] if possible. CSW also explained that a call was placed [facility] representative and CSW was awaiting a call to see if the Pt could get SNF approved. CSW explained to Pt that if [facility] denies SNF then she would need to receive HHPT at home. Pt was agreeable to this plan is CSW will attempt to contact [facility] Representative for a determination. CSW to follow for d/c planning.   Assessment/plan status:  Information/Referral to [employer] Other assessment/ plan:   Information/referral to community resources:   None needed at this time.    PATIENT'S/FAMILY'S RESPONSE TO PLAN OF CARE: Pt was appreciative for information and is awaiting determination for placement.        [facility]  [address];  [phone] Phone: [phone]

## 2013-02-23 NOTE — Progress Notes (Signed)
Subjective: Patient reports Overall she's doing well with regard to pain that's all her back and her hip she does not have any of her preoperative pain in her legs she is experiencing numbness from her knee down the front of her shin it does not extend above her knee does not seem to go in her foot and she's gastrocs EHL no focal weakness she does feel that likely does wear when she ambulates and assuming this is some radiculitis which treatments and steroids and Neurontin will see she does if she has responded steroids and I'll recommended a CAT scan to better evaluate positioning of the implants.  Objective: Vital signs in last 24 hours: Temp:  [97.9 F (36.6 C)-98.3 F (36.8 C)] 97.9 F (36.6 C) (01/02 1021) Pulse Rate:  [76-96] 88 (01/02 1021) Resp:  [16-20] 20 (01/02 1021) BP: (134-178)/(51-95) 148/57 mmHg (01/02 1021) SpO2:  [94 %-100 %] 100 % (01/02 1021)  Intake/Output from previous day: 01/01 0701 - 01/02 0700 In: -  Out: 100 [Drains:100] Intake/Output this shift: Total I/O In: -  Out: 100 [Drains:100]  Strength out of 5 wound clean and dry  Lab Results: No results found for this basename: WBC, HGB, HCT, PLT,  in the last 72 hours BMET No results found for this basename: NA, K, CL, CO2, GLUCOSE, BUN, CREATININE, CALCIUM,  in the last 72 hours  Studies/Results: No results found.  Assessment/Plan: Decadron and Neurontin additionally to help with a neuritis if this doesn't work and she still has the numbness and feels that leg his way will check a CT scan of her lumbar spine to evaluate positioning of the implant.  LOS: 4 days     Gracelin Weisberg P 02/23/2013, 12:48 PM

## 2013-02-23 NOTE — Progress Notes (Signed)
Occupational Therapy Treatment Patient Details Name: Becky Mccall MRN: [id number] DOB: 07/22/1929 Today's Date: 02/23/2013 Time: 6629-4765 OT Time Calculation (min): 14 min  OT Assessment / Plan / Recommendation  History of present illness Pt s/p PLIF x 3 levels.   OT comments  Pt progressing toward goals.  Follow Up Recommendations  Home health OT;Supervision/Assistance - 24 hour    Barriers to Discharge       Equipment Recommendations  None recommended by OT    Recommendations for Other Services    Frequency Min 2X/week   Progress towards OT Goals Progress towards OT goals: Progressing toward goals  Plan Discharge plan remains appropriate    Precautions / Restrictions Precautions Precautions: Back Precaution Comments: Pt able to recall 3/3 back precautions. Required Braces or Orthoses: Spinal Brace Spinal Brace: Applied in sitting position;Lumbar corset   Pertinent Vitals/Pain See vitals    ADL  Lower Body Bathing: Simulated;Supervision/safety (with AE) Where Assessed - Lower Body Bathing: Unsupported sit to stand Lower Body Dressing: Performed;Supervision/safety (with AE) Where Assessed - Lower Body Dressing: Unsupported sit to stand Toilet Transfer: Building services engineer Method: Sit to Loss adjuster, chartered:  (chair) Equipment Used: Back brace;Gait belt;Rolling walker Transfers/Ambulation Related to ADLs: supervision with RW ADL Comments: Reinforced back precaution education and ADL techniques.  Pt states she has 3 reachers at home. Pt also verbalized understanding of toilet aid use and states she plans to use it at home for hygiene.    OT Diagnosis:    OT Problem List:   OT Treatment Interventions:     OT Goals(current goals can now be found in the care plan section) Acute Rehab OT Goals Patient Stated Goal: less pain OT Goal Formulation: With patient Time For Goal Achievement: 02/28/13 Potential to Achieve Goals:  Good ADL Goals Pt Will Perform Grooming: with modified independence;standing Pt Will Perform Lower Body Bathing: with modified independence;sit to/from stand;with adaptive equipment Pt Will Perform Lower Body Dressing: with modified independence;with adaptive equipment;sit to/from stand Pt Will Transfer to Toilet: with modified independence;ambulating;grab bars Pt Will Perform Toileting - Clothing Manipulation and hygiene: with modified independence;sit to/from stand Pt Will Perform Tub/Shower Transfer: Tub transfer;with supervision;ambulating;tub bench;rolling walker Additional ADL Goal #1: Pt will independently verbalize and demonstrate 3/3 back precautions.   Visit Information  Last OT Received On: 02/23/13 Assistance Needed: +1 History of Present Illness: Pt s/p PLIF x 3 levels.    Subjective Data      Prior Functioning       Cognition  Cognition Arousal/Alertness: Awake/alert Behavior During Therapy: WFL for tasks assessed/performed Overall Cognitive Status: Within Functional Limits for tasks assessed    Mobility  Bed Mobility Bed Mobility: Not assessed (pt up in chair)  Transfers Transfers: Sit to Stand;Stand to Sit Sit to Stand: 5: Supervision;From chair/3-in-1 Stand to Sit: 5: Supervision;To chair/3-in-1 Details for Transfer Assistance: Cues for technique.    Exercises      Balance     End of Session OT - End of Session Equipment Utilized During Treatment: Gait belt;Rolling walker;Back brace Activity Tolerance: Patient tolerated treatment well Patient left: in chair;with call bell/phone within reach  GO   02/23/2013 Darrol Jump OTR/L Pager [phone] Office [phone]   Darrol Jump 02/23/2013, 9:05 AM

## 2013-02-23 NOTE — Progress Notes (Signed)
UR complete.  Jarone Ostergaard RN, MSN 

## 2013-02-23 NOTE — Progress Notes (Signed)
Pt has had multiple small bowel movements, insisting on enema to have large bowel movement. Enema administered, awaiting results. Continues to pass gas. Will continue to monitor. Messer, Elease Etienne RN

## 2013-02-24 NOTE — Progress Notes (Signed)
Feels better today. Still having some right hip pain but she was able to stand and ambulate with therapy today. Her mobility is fairly good. Her back pain is well controlled.  She is afebrile. Her vitals are stable. She is awake and alert. She is oriented and appropriate. Her motor and sensory function are stable. Wound is clean and dry. Chest and abdomen are benign.  Progressing well following multilevel lumbar decompression and fusion. Continue efforts at mobilization. Possible discharge home tomorrow with therapy.

## 2013-02-25 MED ORDER — GABAPENTIN 300 MG PO CAPS: 300.0000 mg | ORAL_CAPSULE | Freq: Two times a day (BID) | ORAL | Status: DC

## 2013-02-25 MED ORDER — OXYCODONE-ACETAMINOPHEN 5-325 MG PO TABS: 1.0000 | ORAL_TABLET | ORAL | Status: DC | PRN

## 2013-02-25 NOTE — Progress Notes (Signed)
Patient ID: Becky Mccall, female   DOB: December 19, 1929, 80 y.o.   MRN: [id number] Patient is doing well still with back and hip pain numbness maybe slightly better.  Strength out of 5 wound clean and dry.  Discharge home

## 2013-02-25 NOTE — Progress Notes (Signed)
   CARE MANAGEMENT NOTE 02/25/2013  Patient:  BERNISHA, VERMA   Account Number:  [id number]  Date Initiated:  02/23/2013  Documentation initiated by:  Lorne Skeens  Subjective/Objective Assessment:   Patient admitted for a lumbar laminectomy with PLIF. Lives at home with husband     Action/Plan:   Will follow for discharge needs   Anticipated DC Date:     Anticipated DC Plan:  SKILLED NURSING FACILITY  In-house referral  Clinical Social Worker      DC Forensic scientist  CM consult      [facility] Choice  HOME HEALTH   Choice offered to / List presented to:  C-1 Patient        [place] arranged  [place].   Status of service:  Completed, signed off Medicare Important Message given?   (If response is "NO", the following Medicare IM given date fields will be blank) Date Medicare IM given:   Date Additional Medicare IM given:    Discharge Disposition:  [place]  Per UR Regulation:    If discussed at Long Length of Stay Meetings, dates discussed:    Comments:  02/25/13 14:45 CM spoke with pt and pt's husband in room for choice for [facility] services.  [facility] was chosen to render HHPT/OT. Address and contact numbers were verified.  Referral faxed to [facility] for HHPT/OT.  No other CM needs were communicated. Mariane Masters, BSN, CM [phone].

## 2013-02-25 NOTE — Discharge Summary (Signed)
Physician Discharge Summary  Patient ID: Becky Mccall MRN: [id number] DOB/AGE: 03/05/1929 80 y.o.  Admit date: 02/19/2013 Discharge date: 02/25/2013  Admission Diagnoses: Lumbar spinal stenosis grade 1 spondylolisthesis L 2 to L5  Discharge Diagnoses: Same Active Problems:   Spinal stenosis in cervical region   Discharged Condition: good  Hospital Course: Patient was admitted hospital underwent decompressive laminectomy and fusion from L2-L5 postoperatively patient did very well with recovered in the floor on the floor she was ambulating in voiding spontaneously and tolerating a regular diet was observed pain became under progressively control throughout the week explained experienced a little bit of hip pain and a little bit of numbness in the front of her shins however the numbness progressively improved his pain could persist however it seemed to be mostly referred from the incision site and the surgical manipulation. She'll be discharged was scheduled followup approximately one week she'll be discharged home health physical therapy she'll be discharged on oxycodone for pain although she's not requiring any pain medication in the hospital as well as gabapentin.  Consults: Significant Diagnostic Studies: Treatments: L2-L5 decompression stabilization Discharge Exam: Blood pressure 126/50, pulse 88, temperature 98 F (36.7 C), temperature source Oral, resp. rate 20, height 5\' 7"  (1.702 m), weight 83.915 kg (185 lb), SpO2 95.00%. Strength 5 out of 5 wound clean and dry  Disposition: Home  Discharge Orders   Future Appointments Provider Department Dept Phone   10/08/2013 11:30 AM [facility] [id number]   Future Orders Complete By Expires   Face-to-face encounter (required for Medicare/Medicaid patients)  As directed    Comments:     I Becky Mccall P certify that this patient is under my care and that I, or a nurse practitioner  or physician's assistant working with me, had a face-to-face encounter that meets the physician face-to-face encounter requirements with this patient on 02/25/2013. The encounter with the patient was in whole, or in part for the following medical condition(s) which is the primary reason for home health care (List medical condition): spinal stenosis   Questions:     The encounter with the patient was in whole, or in part, for the following medical condition, which is the primary reason for home health care:  spinal stenosis   I certify that, based on my findings, the following services are medically necessary home health services:  Physical therapy   My clinical findings support the need for the above services:  Unable to leave home safely without assistance and/or assistive device   Further, I certify that my clinical findings support that this patient is homebound due to:  Unsafe ambulation due to balance issues   Reason for Medically Necessary Home Health Services:  Therapy- Therapeutic Exercises to Increase Strength and Endurance   Home Health  As directed    Questions:     To provide the following care/treatments:  PT   OT       Medication List         aspirin EC 81 MG tablet  Take 162 mg by mouth daily.     CALCIUM 1000 + D PO  Take 1 tablet by mouth 2 (two) times daily.     Cinnamon 500 MG capsule  Take 500 mg by mouth 3 (three) times daily.     fish oil-omega-3 fatty acids 1000 MG capsule  Take 1 g by mouth 3 (three) times daily.     gabapentin 300 MG capsule  Commonly known as:  NEURONTIN  Take 1 capsule (300 mg total) by mouth 2 (two) times daily.     levothyroxine 137 MCG tablet  Commonly known as:  SYNTHROID, LEVOTHROID  Take 137 mcg by mouth daily before breakfast.     losartan-hydrochlorothiazide 100-25 MG per tablet  Commonly known as:  HYZAAR  Take 1 tablet by mouth daily.     Magnesium 500 MG Tabs  Take 2 tablets by mouth daily.     multivitamin with minerals  Tabs tablet  Take 1 tablet by mouth daily.     naproxen 500 MG tablet  Commonly known as:  NAPROSYN  Take 500 mg by mouth 2 (two) times daily with a meal.     oxyCODONE-acetaminophen 5-325 MG per tablet  Commonly known as:  PERCOCET/ROXICET  Take 1-2 tablets by mouth every 4 (four) hours as needed for moderate pain.     tiotropium 18 MCG inhalation capsule  Commonly known as:  SPIRIVA  Place 18 mcg into inhaler and inhale daily.     vitamin B-12 1000 MCG tablet  Commonly known as:  CYANOCOBALAMIN  Take 1,000 mcg by mouth daily.     VITAMIN B-6 PO  Take by mouth.     vitamin C 500 MG tablet  Commonly known as:  ASCORBIC ACID  Take 500 mg by mouth daily.     Vitamin D 2000 UNITS tablet  Take 2,000 Units by mouth 3 (three) times daily.     vitamin E 400 UNIT capsule  Take 400 Units by mouth 2 (two) times daily.           Follow-up Information   Follow up with [facility] P, MD.   Specialty:  Neurosurgery   Contact information:   [address] [place] [address] [phone]       Signed: Daesia Mccall P 02/25/2013, 9:14 AM

## 2013-02-25 NOTE — Progress Notes (Signed)
Pt d/c to home by car with family. Assessment stable. Prescriptions given. Pt verbalizes understanding of d/c instructions.

## 2013-02-25 NOTE — Discharge Instructions (Signed)
No lifting no bending no twisting no driving a riding a car less she is gone back and forth to see me. Keep the incision clean and dry. May remove the outer dressing in 2-3 days leave the Steri-Strips on and intact cover the Steri-Strips with Saran wrap to keep them dry during showers only.

## 2013-02-27 MED FILL — Heparin Sodium (Porcine) Inj 1000 Unit/ML: INTRAMUSCULAR | Qty: 30 | Status: AC

## 2013-02-27 MED FILL — Sodium Chloride IV Soln 0.9%: INTRAVENOUS | Qty: 3000 | Status: AC

## 2013-03-05 ENCOUNTER — Other Ambulatory Visit: Payer: Self-pay | Admitting: Neurosurgery

## 2013-03-05 DIAGNOSIS — M4120 Other idiopathic scoliosis, site unspecified: Secondary | ICD-10-CM

## 2013-03-07 ENCOUNTER — Encounter (HOSPITAL_COMMUNITY): Payer: Self-pay | Admitting: Neurosurgery

## 2013-03-09 ENCOUNTER — Ambulatory Visit
Admission: RE | Admit: 2013-03-09 | Discharge: 2013-03-09 | Disposition: A | Discharge status: Home / Self Care | Payer: Medicare Other | Source: Ambulatory Visit | Attending: Neurosurgery | Admitting: Neurosurgery

## 2013-03-09 DIAGNOSIS — M4120 Other idiopathic scoliosis, site unspecified: Secondary | ICD-10-CM

## 2013-05-04 ENCOUNTER — Observation Stay (HOSPITAL_COMMUNITY)
Admission: EM | Admit: 2013-05-04 | Discharge: 2013-05-05 | Disposition: A | Discharge status: Home / Self Care | Payer: Medicare Other | Attending: Internal Medicine | Admitting: Internal Medicine

## 2013-05-04 ENCOUNTER — Emergency Department (HOSPITAL_COMMUNITY): Payer: Medicare Other

## 2013-05-04 ENCOUNTER — Observation Stay (HOSPITAL_COMMUNITY): Payer: Medicare Other

## 2013-05-04 ENCOUNTER — Encounter (HOSPITAL_COMMUNITY): Payer: Self-pay | Admitting: Emergency Medicine

## 2013-05-04 DIAGNOSIS — K219 Gastro-esophageal reflux disease without esophagitis: Secondary | ICD-10-CM

## 2013-05-04 DIAGNOSIS — E039 Hypothyroidism, unspecified: Secondary | ICD-10-CM

## 2013-05-04 DIAGNOSIS — R42 Dizziness and giddiness: Principal | ICD-10-CM

## 2013-05-04 DIAGNOSIS — Z79899 Other long term (current) drug therapy: Secondary | ICD-10-CM | POA: Insufficient documentation

## 2013-05-04 DIAGNOSIS — R011 Cardiac murmur, unspecified: Secondary | ICD-10-CM

## 2013-05-04 DIAGNOSIS — I350 Nonrheumatic aortic (valve) stenosis: Secondary | ICD-10-CM

## 2013-05-04 DIAGNOSIS — Z888 Allergy status to other drugs, medicaments and biological substances status: Secondary | ICD-10-CM | POA: Insufficient documentation

## 2013-05-04 DIAGNOSIS — I359 Nonrheumatic aortic valve disorder, unspecified: Secondary | ICD-10-CM

## 2013-05-04 DIAGNOSIS — H9190 Unspecified hearing loss, unspecified ear: Secondary | ICD-10-CM

## 2013-05-04 DIAGNOSIS — D649 Anemia, unspecified: Secondary | ICD-10-CM

## 2013-05-04 DIAGNOSIS — G459 Transient cerebral ischemic attack, unspecified: Secondary | ICD-10-CM

## 2013-05-04 DIAGNOSIS — Z87828 Personal history of other (healed) physical injury and trauma: Secondary | ICD-10-CM | POA: Insufficient documentation

## 2013-05-04 DIAGNOSIS — J4489 Other specified chronic obstructive pulmonary disease: Secondary | ICD-10-CM | POA: Insufficient documentation

## 2013-05-04 DIAGNOSIS — H538 Other visual disturbances: Secondary | ICD-10-CM

## 2013-05-04 DIAGNOSIS — M129 Arthropathy, unspecified: Secondary | ICD-10-CM | POA: Insufficient documentation

## 2013-05-04 DIAGNOSIS — Z87891 Personal history of nicotine dependence: Secondary | ICD-10-CM | POA: Insufficient documentation

## 2013-05-04 DIAGNOSIS — Z7982 Long term (current) use of aspirin: Secondary | ICD-10-CM | POA: Insufficient documentation

## 2013-05-04 DIAGNOSIS — Z9889 Other specified postprocedural states: Secondary | ICD-10-CM | POA: Insufficient documentation

## 2013-05-04 DIAGNOSIS — J449 Chronic obstructive pulmonary disease, unspecified: Secondary | ICD-10-CM

## 2013-05-04 DIAGNOSIS — E785 Hyperlipidemia, unspecified: Secondary | ICD-10-CM

## 2013-05-04 DIAGNOSIS — I10 Essential (primary) hypertension: Secondary | ICD-10-CM

## 2013-05-04 LAB — CBC WITH DIFFERENTIAL/PLATELET
BASOS PCT: 1 % (ref 0–1)
Basophils Absolute: 0 10*3/uL (ref 0.0–0.1)
Eosinophils Absolute: 0 10*3/uL (ref 0.0–0.7)
Eosinophils Relative: 1 % (ref 0–5)
HEMATOCRIT: 29 % — AB (ref 36.0–46.0)
Hemoglobin: 8.7 g/dL — ABNORMAL LOW (ref 12.0–15.0)
Lymphocytes Relative: 17 % (ref 12–46)
Lymphs Abs: 0.8 10*3/uL (ref 0.7–4.0)
MCH: 24 pg — AB (ref 26.0–34.0)
MCHC: 30 g/dL (ref 30.0–36.0)
MCV: 80.1 fL (ref 78.0–100.0)
MONO ABS: 0.4 10*3/uL (ref 0.1–1.0)
MONOS PCT: 8 % (ref 3–12)
NEUTROS ABS: 3.4 10*3/uL (ref 1.7–7.7)
Neutrophils Relative %: 74 % (ref 43–77)
Platelets: 244 10*3/uL (ref 150–400)
RBC: 3.62 MIL/uL — ABNORMAL LOW (ref 3.87–5.11)
RDW: 15.6 % — ABNORMAL HIGH (ref 11.5–15.5)
WBC: 4.7 10*3/uL (ref 4.0–10.5)

## 2013-05-04 LAB — URINALYSIS, ROUTINE W REFLEX MICROSCOPIC
BILIRUBIN URINE: NEGATIVE
GLUCOSE, UA: NEGATIVE mg/dL
HGB URINE DIPSTICK: NEGATIVE
KETONES UR: NEGATIVE mg/dL
Leukocytes, UA: NEGATIVE
Nitrite: NEGATIVE
PH: 7.5 (ref 5.0–8.0)
Protein, ur: NEGATIVE mg/dL
SPECIFIC GRAVITY, URINE: 1.017 (ref 1.005–1.030)
Urobilinogen, UA: 0.2 mg/dL (ref 0.0–1.0)

## 2013-05-04 LAB — BASIC METABOLIC PANEL
BUN: 27 mg/dL — AB (ref 6–23)
CHLORIDE: 103 meq/L (ref 96–112)
CO2: 26 mEq/L (ref 19–32)
Calcium: 9.3 mg/dL (ref 8.4–10.5)
Creatinine, Ser: 1.03 mg/dL (ref 0.50–1.10)
GFR calc Af Amer: 57 mL/min — ABNORMAL LOW (ref >90)
GFR calc non Af Amer: 50 mL/min — ABNORMAL LOW (ref >90)
GLUCOSE: 124 mg/dL — AB (ref 70–99)
POTASSIUM: 4.4 meq/L (ref 3.7–5.3)
Sodium: 143 mEq/L (ref 137–147)

## 2013-05-04 LAB — GLUCOSE, CAPILLARY
GLUCOSE-CAPILLARY: 132 mg/dL — AB (ref 70–99)
Glucose-Capillary: 100 mg/dL — ABNORMAL HIGH (ref 70–99)

## 2013-05-04 LAB — I-STAT TROPONIN, ED: Troponin i, poc: 0.01 ng/mL (ref 0.00–0.08)

## 2013-05-04 LAB — HEMOGLOBIN A1C
Hgb A1c MFr Bld: 5.6 % (ref <5.7)
Mean Plasma Glucose: 114 mg/dL (ref <117)

## 2013-05-04 LAB — CBG MONITORING, ED: GLUCOSE-CAPILLARY: 121 mg/dL — AB (ref 70–99)

## 2013-05-04 MED ORDER — CLOPIDOGREL BISULFATE 75 MG PO TABS
75.0000 mg | ORAL_TABLET | Freq: Every day | ORAL | Status: DC
  Administered 2013-05-05: 75 mg via ORAL
  Filled 2013-05-04: qty 1

## 2013-05-04 MED ORDER — ASPIRIN 81 MG PO CHEW: 324.0000 mg | CHEWABLE_TABLET | Freq: Once | ORAL | Status: DC

## 2013-05-04 MED ORDER — STROKE: EARLY STAGES OF RECOVERY BOOK
Freq: Once | Status: DC
  Filled 2013-05-04 (×2): qty 1

## 2013-05-04 MED ORDER — MECLIZINE HCL 25 MG PO TABS
25.0000 mg | ORAL_TABLET | Freq: Three times a day (TID) | ORAL | Status: DC | PRN
  Filled 2013-05-04: qty 1

## 2013-05-04 MED ORDER — LOSARTAN POTASSIUM 50 MG PO TABS
100.0000 mg | ORAL_TABLET | Freq: Every day | ORAL | Status: DC
  Administered 2013-05-04: 100 mg via ORAL
  Filled 2013-05-04 (×2): qty 2

## 2013-05-04 MED ORDER — ASPIRIN EC 81 MG PO TBEC
162.0000 mg | DELAYED_RELEASE_TABLET | Freq: Every day | ORAL | Status: DC
  Administered 2013-05-04: 162 mg via ORAL
  Filled 2013-05-04: qty 2

## 2013-05-04 MED ORDER — PANTOPRAZOLE SODIUM 40 MG PO TBEC
40.0000 mg | DELAYED_RELEASE_TABLET | Freq: Every day | ORAL | Status: DC
  Administered 2013-05-04: 40 mg via ORAL

## 2013-05-04 MED ORDER — ENOXAPARIN SODIUM 40 MG/0.4ML ~~LOC~~ SOLN
40.0000 mg | SUBCUTANEOUS | Status: DC
  Administered 2013-05-04: 40 mg via SUBCUTANEOUS
  Filled 2013-05-04 (×2): qty 0.4

## 2013-05-04 MED ORDER — ASPIRIN 81 MG PO CHEW
324.0000 mg | CHEWABLE_TABLET | Freq: Once | ORAL | Status: AC
Start: 2013-05-04 — End: 2013-05-04
  Administered 2013-05-04: 324 mg via ORAL
  Filled 2013-05-04: qty 4

## 2013-05-04 MED ORDER — LEVOTHYROXINE SODIUM 137 MCG PO TABS
137.0000 ug | ORAL_TABLET | Freq: Every day | ORAL | Status: DC
  Administered 2013-05-04 – 2013-05-05 (×2): 137 ug via ORAL
  Filled 2013-05-04 (×3): qty 1

## 2013-05-04 MED ORDER — OMEGA-3-ACID ETHYL ESTERS 1 G PO CAPS
2.0000 g | ORAL_CAPSULE | Freq: Two times a day (BID) | ORAL | Status: DC
  Administered 2013-05-04: 2 g via ORAL
  Filled 2013-05-04 (×3): qty 2

## 2013-05-04 MED ORDER — TIOTROPIUM BROMIDE MONOHYDRATE 18 MCG IN CAPS
18.0000 ug | ORAL_CAPSULE | Freq: Every day | RESPIRATORY_TRACT | Status: DC
  Filled 2013-05-04: qty 5

## 2013-05-04 MED ORDER — HYDROCHLOROTHIAZIDE 25 MG PO TABS
25.0000 mg | ORAL_TABLET | Freq: Every day | ORAL | Status: DC
  Administered 2013-05-04: 25 mg via ORAL
  Filled 2013-05-04 (×2): qty 1

## 2013-05-04 MED ORDER — LOSARTAN POTASSIUM-HCTZ 100-25 MG PO TABS: 1.0000 | ORAL_TABLET | Freq: Every day | ORAL | Status: DC

## 2013-05-04 NOTE — ED Notes (Signed)
EMS reports pt st's she woke up at approx 23:45 tonight with c/o dizziness, hot flashes, dry mouth, and blurry vision.  On EMS arrival symptoms had resolved, only c/o dry at this time.  Was given Zofran 4mg  IV for nausea which has subsided.

## 2013-05-04 NOTE — Progress Notes (Signed)
  Echocardiogram 2D Echocardiogram has been performed.  Guy Seese FRANCES 05/04/2013, 6:09 PM

## 2013-05-04 NOTE — Evaluation (Signed)
Physical Therapy Evaluation Patient Details Name: Becky Mccall MRN: [id number] DOB: [birth year] Today's Date: 05/04/2013 Time: 2297-9892 PT Time Calculation (min): 43 min  PT Assessment / Plan / Recommendation History of Present Illness  Becky Mccall is an 80 y.o. female with a past medical history significant for HTN, hyperlipidemia, heart failure, COPD, arthritis, brought in by EMS due to acute onset of dizziness, blurred vision, unsteadiness, N/V.  Patient had back surgery 02/19/13 and had just stopped using RW 2 weeks pta.  Clinical Impression  Patient presents with problems listed below.  Will benefit from acute PT to address Rt posterior canal BPPV.  Recommend OP PT for vestibular rehab at discharge.    PT Assessment  Patient needs continued PT services    Follow Up Recommendations  Outpatient PT;Supervision/Assistance - 24 hour (for vestibular rehab)    Does the patient have the potential to tolerate intense rehabilitation      Barriers to Discharge        Equipment Recommendations  None recommended by PT    Recommendations for Other Services     Frequency Min 3X/week    Precautions / Restrictions Precautions Precautions: Fall Required Braces or Orthoses: Spinal Brace Spinal Brace: Lumbar corset;Applied in sitting position (prn post back surgery) Restrictions Weight Bearing Restrictions: No   Pertinent Vitals/Pain       Mobility  Bed Mobility Overal bed mobility: Modified Independent General bed mobility comments: Use of bedrail and requires increased time. Transfers Overall transfer level: Needs assistance Equipment used: None Transfers: Sit to/from Stand Sit to Stand: Supervision General transfer comment: Supervision for safety.  Slight dizziness with standing. Ambulation/Gait Ambulation/Gait assistance: Min guard Ambulation Distance (Feet): 120 Feet Assistive device: None Gait Pattern/deviations: Step-through pattern;Decreased stride  length;Staggering right Gait velocity: Decreased Gait velocity interpretation: Below normal speed for age/gender General Gait Details: Patient able to ambulate without assistive device this afternoon.  Continues to have dizziness.  Did lose balance x1 during gait, requiring assist to regain balance. Modified Rankin (Stroke Patients Only) Pre-Morbid Rankin Score: No symptoms Modified Rankin: No significant disability    Vestibular Eval  Oculomotor assess: Patient with normal oculomotor eval - normal gaze holding, smooth pursuits and saccades.  Normal VOR test.   BPPV testing:  Performed Modified Hallpike to both sides.  Patient with + BPPV in Rt posterior canal, with dizziness at 4/4 and nystagmus.  Performed Epley Canalith Repositioning maneuver.   PT Diagnosis: Abnormality of gait (Dizziness)  PT Problem List: Decreased activity tolerance;Decreased balance;Decreased mobility (Dizziness) PT Treatment Interventions: Gait training;Functional mobility training;Therapeutic exercise;Balance training;Patient/family education (Vestibular treatment)     PT Goals(Current goals can be found in the care plan section) Acute Rehab PT Goals Patient Stated Goal: to go home PT Goal Formulation: With patient/family Time For Goal Achievement: 05/11/13 Potential to Achieve Goals: Good  Visit Information  Last PT Received On: 05/04/13 Assistance Needed: +1 History of Present Illness: Becky Mccall is an 81 y.o. female with a past medical history significant for HTN, hyperlipidemia, heart failure, COPD, arthritis, brought in by EMS due to acute onset of dizziness, blurred vision, unsteadiness, N/V.  Patient had back surgery 02/19/13 and had just stopped using RW 2 weeks pta.       Prior [place] expects to be discharged to:: Private residence Living Arrangements: Spouse/significant other Available Help at Discharge: Family;Available 24 hours/day Type of Home:  House Home Access: Stairs to enter [employer] of Steps: 1 Entrance Stairs-Rails: Right Home Layout:  One level Home Equipment: [place] - 2 wheels;Walker - 4 wheels;Cane - single point;Bedside commode;Tub bench;Wheelchair - Higher education careers adviser: [place] Prior Function Level of Independence: Independent Comments: Back surgery 02/19/13.  Stopped using RW 2 weeks pta.  Brace is now prn. Communication Communication: No difficulties;HOH (wears bil. hearing aids) Dominant Hand: Right    Cognition  Cognition Arousal/Alertness: Awake/alert Behavior During Therapy: WFL for tasks assessed/performed Overall Cognitive Status: Within Functional Limits for tasks assessed    Extremity/Trunk Assessment Upper Extremity Assessment Upper Extremity Assessment: Defer to OT evaluation Lower Extremity Assessment Lower Extremity Assessment: Overall WFL for tasks assessed   Balance Balance Overall balance assessment: Needs assistance Sitting-balance support: No upper extremity supported;Feet supported Sitting balance-Leahy Scale: Good Sitting balance - Comments: Minimal dizziness in sitting.  Good balance. Standing balance support: No upper extremity supported Standing balance-Leahy Scale: Fair Standing balance comment: Decreased balance primarily due to dizziness.  End of Session PT - End of Session Equipment Utilized During Treatment: Gait belt Activity Tolerance: Patient tolerated treatment well (Limited by vertigo) Patient left: in bed;with call bell/phone within reach;with family/visitor present (with HOB elevated) Nurse Communication: Mobility status (Positional vertigo)  GP Functional Assessment Tool Used: Clinical judgement Functional Limitation: Mobility: Walking and moving around Mobility: Walking and Moving Around Current Status (B0962): At least 20 percent but less than 40 percent impaired, limited or restricted Mobility: Walking and Moving Around Goal Status [phone]): At  least 1 percent but less than 20 percent impaired, limited or restricted   Despina Pole 05/04/2013, 2:56 PM Carita Pian. Sanjuana Kava, [place] Pager [phone]

## 2013-05-04 NOTE — ED Notes (Signed)
REturned from MRI.

## 2013-05-04 NOTE — Consult Note (Addendum)
Referring Physician: ED    Chief Complaint: transient vertigo, blurred vision, unsteadiness.  HPI:                                                                                                                                         Becky Mccall is an 80 y.o. female with a past medical history significant for HTN, hyperlipidemia, heart failure, COPD, arthritis, brought in by EMS due to acute onset of the above stated symptoms. She said that she went to bed around 915 pm feeling well, but woke up at approx 23:45 tonight with blurred vision that impaired her vision and couldn't read the numbers in the clock, vertigo described as room spinning, and when she tried to walk to the bathroom she was very unstable and had to be help by her husband. EMS was summoned and she vomited in her way to the ED. Stated that the whole episode lasted for about 30 minutes but then had an isolated episode of double vision while being examined in the ED. Denies HA, difficulty swallowing, focal weakness or numbness, slurred speech, confusion, or language impairment. CT brain showed no acute abnormality. Takes 2 baby aspirin daily.   Date last known well: 05/04/13 Time last known well: 915 pm tPA Given: no, symptoms resolved NIHSS: 0 MRS: 0  Past Medical History  Diagnosis Date  . Hard of hearing     wears hearing aides both ears  . Intermittent dysphagia     for pills and solids  . Arthritis   . Hyperlipidemia     can't take the meds so takes Fish Oil  . Heart murmur     sees Dr Fransico Him  . Abrasion of left lower extremity 12/2012    draining wound on LLL;  . H/O heart failure 2004    evaluated by Dr [name]; no sequela  . Aortic stenosis     mild AR/AS by 10/05/12 echo (Dr. Fransico Him)  . Hypertension     takes Losartan-HCTZ daily  . COPD (chronic obstructive pulmonary disease)     Spiriva inhaler daily  . Hypothyroidism     takes Synthroid daily  . GERD (gastroesophageal reflux disease)      occasionally Rolaids or Tums  . Shortness of breath     with exertion  . Chest congestion     was on ZPAK and completed on 02/17/13    Past Surgical History  Procedure Laterality Date  . Lumbar laminectomy  1999  . Abdominal hysterectomy    . Hemorroidectomy    . Eye surgery    . Cataract extraction w/ intraocular lens  implant, bilateral  1999,2000  . Back surgery  2009    No family history on file. Social History:  reports that she has quit smoking. Her smoking use included Cigarettes. She has a 120 pack-year smoking history. She has never used smokeless tobacco.  She reports that she drinks about 3.5 ounces of alcohol per week. She reports that she does not use illicit drugs.  Allergies:  Allergies  Allergen Reactions  . Statins     Muscle pain    Medications:                                                                                                                           I have reviewed the patient's current medications.  ROS:                                                                                                                                       History obtained from the patient and family  General ROS: negative for - chills, fatigue, fever, night sweats, weight gain or weight loss Psychological ROS: negative for - behavioral disorder, hallucinations, memory difficulties, mood swings or suicidal ideation Ophthalmic ROS: negative for -  eye pain or loss of vision ENT ROS: negative for - epistaxis, nasal discharge, oral lesions, sore throat, tinnitus or vertigo Allergy and Immunology ROS: negative for - hives or itchy/watery eyes Hematological and Lymphatic ROS: negative for - bleeding problems, bruising or swollen lymph nodes Endocrine ROS: negative for - galactorrhea, hair pattern changes, polydipsia/polyuria or temperature intolerance Respiratory ROS: negative for - cough, hemoptysis, shortness of breath or wheezing Cardiovascular ROS: negative  for - chest pain, dyspnea on exertion, edema or irregular heartbeat Gastrointestinal ROS: negative for - abdominal pain, diarrhea, hematemesis, nausea/vomiting or stool incontinence Genito-Urinary ROS: negative for - dysuria, hematuria, incontinence or urinary frequency/urgency Musculoskeletal ROS: negative for - joint swelling or muscular weakness Neurological ROS: as noted in HPI Dermatological ROS: negative for rash and skin lesion changes  Physical exam: pleasant female in no apparent distress. Blood pressure 153/67, pulse 85, temperature 97.7 F (36.5 C), temperature source Oral, resp. rate 18, height $RemoveBe'5\' 7"'QKjMsueUj$  (1.702 m), weight 82.101 kg (181 lb), SpO2 88.00%. Head: normocephalic. Neck: supple, no bruits, no JVD. Cardiac: no murmurs. Lungs: clear. Abdomen: soft, no tender, no mass. Extremities: no edema. CV: pulses palpable throughout  Neurologic Examination:  Mental Status: Alert, oriented, thought content appropriate.  Speech fluent without evidence of aphasia.  Able to follow 3 step commands without difficulty. Cranial Nerves: II: Discs flat bilaterally; Visual fields grossly normal, pupils equal, round, reactive to light and accommodation III,IV, VI: ptosis not present, extra-ocular motions intact bilaterally V,VII: smile symmetric, facial light touch sensation normal bilaterally VIII: hearing normal bilaterally IX,X: gag reflex present XI: bilateral shoulder shrug XII: midline tongue extension without atrophy or fasciculations Motor: Right : Upper extremity   5/5    Left:     Upper extremity   5/5  Lower extremity   5/5     Lower extremity   5/5 Tone and bulk:normal tone throughout; no atrophy noted Sensory: Pinprick and light touch intact throughout, bilaterally Deep Tendon Reflexes:  1+ all over Plantars: Right: downgoing   Left: downgoing Cerebellar: normal  finger-to-nose,  normal heel-to-shin test Gait:  No tested.  Results for orders placed during the hospital encounter of 05/04/13 (from the past 48 hour(s))  CBC WITH DIFFERENTIAL     Status: Abnormal   Collection Time    05/04/13  3:01 AM      Result Value Ref Range   WBC 4.7  4.0 - 10.5 K/uL   RBC 3.62 (*) 3.87 - 5.11 MIL/uL   Hemoglobin 8.7 (*) 12.0 - 15.0 g/dL   HCT 29.0 (*) 36.0 - 46.0 %   MCV 80.1  78.0 - 100.0 fL   MCH 24.0 (*) 26.0 - 34.0 pg   MCHC 30.0  30.0 - 36.0 g/dL   RDW 15.6 (*) 11.5 - 15.5 %   Platelets 244  150 - 400 K/uL   Neutrophils Relative % 74  43 - 77 %   Neutro Abs 3.4  1.7 - 7.7 K/uL   Lymphocytes Relative 17  12 - 46 %   Lymphs Abs 0.8  0.7 - 4.0 K/uL   Monocytes Relative 8  3 - 12 %   Monocytes Absolute 0.4  0.1 - 1.0 K/uL   Eosinophils Relative 1  0 - 5 %   Eosinophils Absolute 0.0  0.0 - 0.7 K/uL   Basophils Relative 1  0 - 1 %   Basophils Absolute 0.0  0.0 - 0.1 K/uL  BASIC METABOLIC PANEL     Status: Abnormal   Collection Time    05/04/13  3:01 AM      Result Value Ref Range   Sodium 143  137 - 147 mEq/L   Potassium 4.4  3.7 - 5.3 mEq/L   Chloride 103  96 - 112 mEq/L   CO2 26  19 - 32 mEq/L   Glucose, Bld 124 (*) 70 - 99 mg/dL   BUN 27 (*) 6 - 23 mg/dL   Creatinine, Ser 1.03  0.50 - 1.10 mg/dL   Calcium 9.3  8.4 - 10.5 mg/dL   GFR calc non Af Amer 50 (*) >90 mL/min   GFR calc Af Amer 57 (*) >90 mL/min   Comment: (NOTE)     The eGFR has been calculated using the CKD EPI equation.     This calculation has not been validated in all clinical situations.     eGFR's persistently <90 mL/min signify possible Chronic Kidney     Disease.  CBG MONITORING, ED     Status: Abnormal   Collection Time    05/04/13  3:05 AM      Result Value Ref Range   Glucose-Capillary 121 (*) 70 - 99 mg/dL  I-STAT  [place], ED     Status: None   Collection Time    05/04/13  3:09 AM      Result Value Ref Range   Troponin i, poc 0.01  0.00 - 0.08 ng/mL   Comment  3            Comment: Due to the release kinetics of cTnI,     a negative result within the first hours     of the onset of symptoms does not rule out     myocardial infarction with certainty.     If myocardial infarction is still suspected,     repeat the test at appropriate intervals.  URINALYSIS, ROUTINE W REFLEX MICROSCOPIC     Status: Abnormal   Collection Time    05/04/13  3:28 AM      Result Value Ref Range   Color, Urine YELLOW  YELLOW   APPearance CLOUDY (*) CLEAR   Specific Gravity, Urine 1.017  1.005 - 1.030   pH 7.5  5.0 - 8.0   Glucose, UA NEGATIVE  NEGATIVE mg/dL   Hgb urine dipstick NEGATIVE  NEGATIVE   Bilirubin Urine NEGATIVE  NEGATIVE   Ketones, ur NEGATIVE  NEGATIVE mg/dL   Protein, ur NEGATIVE  NEGATIVE mg/dL   Urobilinogen, UA 0.2  0.0 - 1.0 mg/dL   Nitrite NEGATIVE  NEGATIVE   Leukocytes, UA NEGATIVE  NEGATIVE   Comment: MICROSCOPIC NOT DONE ON URINES WITH NEGATIVE PROTEIN, BLOOD, LEUKOCYTES, NITRITE, OR GLUCOSE <1000 mg/dL.   Ct Head Wo Contrast  05/04/2013   CLINICAL DATA:  Dizziness, blurry vision, unable to walk.  EXAM: CT HEAD WITHOUT CONTRAST  TECHNIQUE: Contiguous axial images were obtained from the base of the skull through the vertex without intravenous contrast.  COMPARISON:  None.  FINDINGS: Mild periventricular and subcortical white matter hypodensities, a nonspecific finding often seen in the setting of chronic microangiopathic change. No definite CT evidence of an acute infarction. No hydrocephalus. The ventricles, cisterns, and sulci are normal in size, shape, and position. No intraparenchymal hemorrhage, mass, mass effect, or abnormal extra-axial fluid collection. The visualized paranasal sinuses and mastoid air cells are predominantly clear.  IMPRESSION: Mild white matter changes. No CT evidence of an acute intracranial abnormality.  Recommend MRI if concern for acute ischemia persists.   Electronically Signed   By: Carlos Levering M.D.   On:  05/04/2013 05:17   Dg Chest Port 1 View  05/04/2013   CLINICAL DATA:  Vertigo, chronic shortness of breath  EXAM: PORTABLE CHEST - 1 VIEW  COMPARISON:  12/29/2012  FINDINGS: The heart size and mediastinal contours are within normal limits. Mild lung base opacities. No pleural effusion or pneumothorax. The visualized skeletal structures are unremarkable.  IMPRESSION: Mild lung base opacities ; atelectasis/scarring versus infiltrate.   Electronically Signed   By: Carlos Levering M.D.   On: 05/04/2013 05:41    Assessment: 80 y.o. female with a constellation symptoms sugggestive of probable posterior circulation TIA. No a TPA candidate due to resolution of her symptoms and late presentation. Admit to medicine and complete TIA work up. Aspirin 325 mg daily. Stroke team will resume care.  Stroke Risk Factors - age, HTN, hyperlipidemia, heart failure  Plan: 1. HgbA1c, fasting lipid panel 2. MRI, MRA  of the brain without contrast 3. Echocardiogram 4. Carotid dopplers 5. Prophylactic therapy-aspirin 325 mg daily 6. Risk factor modification 7. Telemetry monitoring 8. Frequent neuro checks 9. PT/OT SLP  Dorian Pod, MD [facility] [phone]  05/04/2013, 6:56 AM

## 2013-05-04 NOTE — Progress Notes (Signed)
Stroke Team Progress Note  HISTORY Becky Mccall is an 80 y.o. female with a past medical history significant for HTN, hyperlipidemia, heart failure, COPD, arthritis, brought in by EMS 05/04/2013 due to acute onset of transient vertigo, blurred vision, and unsteadiness. She said that she went to bed around 915 pm on 05/03/2013 feeling well, but woke up at approx 23:45 that night with blurred vision so she couldn't read the numbers in the clock, vertigo described as room spinning, and when she tried to walk to the bathroom she was very unstable and had to be helped by her husband. EMS was summoned and she vomited on her way to the ED.  Stated that the whole episode lasted about 30 minutes but then had an isolated episode of double vision while being examined in the ED.  Denied HA, difficulty swallowing, focal weakness or numbness, slurred speech, confusion, or language impairment.  CT brain showed no acute abnormality.  Takes 2 baby aspirin daily.   Date last known well: 05/04/13  Time last known well: 915 pm  tPA Given: no, symptoms resolved  NIHSS: 0  MRS: 0   SUBJECTIVE There were no family members present this morning. Dr. [name] discussed possible participation in the Socrates trial.  OBJECTIVE Most recent Vital Signs: Filed Vitals:   05/04/13 0843 05/04/13 0922 05/04/13 1200 05/04/13 1351  BP:  134/48 134/49 146/87  Pulse:  85    Temp: 98.1 F (36.7 C) 98.2 F (36.8 C)    TempSrc: Oral Oral    Resp:  18    Height:  5\' 7"  (1.702 m)    Weight:  81.647 kg (180 lb)    SpO2:  94%     CBG (last 3)   Recent Labs  05/04/13 0305  GLUCAP 121*    IV Fluid Intake:     MEDICATIONS  . [START ON 05/05/2013] clopidogrel  75 mg Oral Q breakfast  . enoxaparin (LOVENOX) injection  40 mg Subcutaneous Q24H  . losartan  100 mg Oral Daily   And  . hydrochlorothiazide  25 mg Oral Daily  . levothyroxine  137 mcg Oral QAC breakfast  . omega-3 acid ethyl esters  2 g Oral BID  .  pantoprazole  40 mg Oral Q1200  . tiotropium  18 mcg Inhalation Daily   PRN:  meclizine  Diet:  Cardiac thin liquids Activity:  Bathroom privileges with assistance DVT Prophylaxis:  Lovenox  CLINICALLY SIGNIFICANT STUDIES Basic Metabolic Panel:  Recent Labs Lab 05/04/13 0301  NA 143  K 4.4  CL 103  CO2 26  GLUCOSE 124*  BUN 27*  CREATININE 1.03  CALCIUM 9.3   Liver Function Tests: No results found for this basename: AST, ALT, ALKPHOS, BILITOT, PROT, ALBUMIN,  in the last 168 hours CBC:  Recent Labs Lab 05/04/13 0301  WBC 4.7  NEUTROABS 3.4  HGB 8.7*  HCT 29.0*  MCV 80.1  PLT 244   Coagulation: No results found for this basename: LABPROT, INR,  in the last 168 hours Cardiac Enzymes: No results found for this basename: CKTOTAL, CKMB, CKMBINDEX, TROPONINI,  in the last 168 hours Urinalysis:  Recent Labs Lab 05/04/13 0328  COLORURINE YELLOW  LABSPEC 1.017  PHURINE 7.5  GLUCOSEU NEGATIVE  HGBUR NEGATIVE  BILIRUBINUR NEGATIVE  KETONESUR NEGATIVE  PROTEINUR NEGATIVE  UROBILINOGEN 0.2  NITRITE NEGATIVE  LEUKOCYTESUR NEGATIVE   Lipid Panel No results found for this basename: chol, trig, hdl, cholhdl, vldl, ldlcalc   HgbA1C  No results found  for this basename: HGBA1C    Urine Drug Screen:   No results found for this basename: labopia, cocainscrnur, labbenz, amphetmu, thcu, labbarb    Alcohol Level: No results found for this basename: ETH,  in the last 168 hours  Ct Head Wo Contrast 05/04/2013    Mild white matter changes. No CT evidence of an acute intracranial abnormality.  Recommend MRI if concern for acute ischemia persists.    Mri Brain Without Contrast 05/04/2013    No acute infarction. Mild chronic small-vessel change of the pons in the cerebral hemispheric white matter.  50% stenosis of the left M1 segment.   2 mm aneurysm projecting laterally from the proximal carotid siphon on the left. Small infundibulae versus aneurysms (2-3 mm) at both superior  hypophyseal artery or posterior communicating regions.       Dg Chest Port 1 View 05/04/2013    Mild lung base opacities ; atelectasis/scarring versus infiltrate.     2D Echocardiogram  pending  Carotid Doppler  pending  EKG  sinus rhythm rate 82 beats per minute  - For complete results please see formal report.   Therapy Recommendations pending  Physical Exam   Mental Status:  Alert, oriented, thought content appropriate. Speech fluent without evidence of aphasia. Able to follow 3 step commands without difficulty.  Cranial Nerves:  II: Discs flat bilaterally; Visual fields grossly normal, pupils equal, round, reactive to light and accommodation  III,IV, VI: ptosis not present, extra-ocular motions intact bilaterally  V,VII: smile symmetric, facial light touch sensation normal bilaterally  VIII: Hard of hearing bilaterally IX,X: gag reflex present  XI: bilateral shoulder shrug  XII: midline tongue extension without atrophy or fasciculations  Motor:  Right : Upper extremity 5/5 Left: Upper extremity 5/5  Lower extremity 5/5 Lower extremity 5/5  Tone and bulk:normal tone throughout; no atrophy noted  Sensory: Pinprick and light touch intact throughout, bilaterally  Deep Tendon Reflexes:  1+ all over  Plantars:  Right: downgoing Left: downgoing  Cerebellar:  normal finger-to-nose, normal heel-to-shin test  Gait:  Not tested.   ASSESSMENT Becky Mccall is a 80 y.o. female presenting with vertigo, blurred vision, and unsteadiness. TPA was not given as the patient's deficits had resolved. An MRI was negative for an acute infarct. It was felt that the patient had   a  Posterior circulationTIA. On aspirin 81 mg orally every day prior to admission. Now on clopidogrel 75 mg orally every day for secondary stroke prevention. Patient with resultant resolution of deficits. Stroke work up underway.   Possible Socrates Trial candidate  Anemia hemoglobin 8.7 hematocrit  29  Lipid panel pending  Hemoglobin A1c pending  History of hyperlipidemia  Remote tobacco history / COPD  Hypertension   Hospital day # 0  TREATMENT/PLAN  Continue clopidogrel 75 mg orally every day for secondary stroke prevention unless patient decides on Socrates Trial.  Await 2-D echo, carotid Dopplers, hemoglobin A1c, and lipids  Await therapy evaluations  Discussed with patient possible participation in the SOCRATES stroke trial but she refused   Lowry Ram [facility] Pager [phone] 05/04/2013, 2:59 PM  I have personally obtained a history, examined the patient, evaluated imaging results, and formulated the assessment and plan of care. I agree with the above. Antony Contras, MD   To contact Stroke Continuity provider, please refer to [email or web address] After hours, contact General Neurology

## 2013-05-04 NOTE — Progress Notes (Signed)
Patient seen and examined; admitted after midnight secondary to vertigo, blurred vision and diplopia. Admitted for TIA and R/O stroke. Referred to Dr. Posey Pronto H&P for further detail/info on admission.  Plan: -positive posterior vertigo with vestibular evaluation; will continue PT/OT -start PRN meclizine -has passed swallowing test; will advance diet -patient MRI and CT neg for acute stroke -follow 2-D echo and carotid dopplers -patient was on ASA prior to admission; will change to plavix -stroke team on board; will follow any further rec's -resume lovaza; patient with allergy/intolerance to statin.   Becky Mccall [phone]

## 2013-05-04 NOTE — Progress Notes (Signed)
*  PRELIMINARY RESULTS* Vascular Ultrasound Carotid Duplex (Doppler) has been completed.  Preliminary findings: Bilateral: 1-39% internal carotid artery stenosis.  Bilateral:  Vertebral artery flow is antegrade.    Gladstone Rosas FRANCES RCS 05/04/2013, 7:18 PM

## 2013-05-04 NOTE — Evaluation (Signed)
Occupational Therapy Evaluation Patient Details Name: Becky Mccall MRN: [id number] DOB: 1929-12-02 Today's Date: 05/04/2013 Time: 1050-1108 OT Time Calculation (min): 18 min  OT Assessment / Plan / Recommendation History of present illness Becky Mccall is an 80 y.o. female with a past medical history significant for HTN, hyperlipidemia, heart failure, COPD, arthritis, brought in by EMS due to acute onset of the above stated symptoms. She said that she went to bed around 915 pm feeling well, but woke up at approx 23:45 tonight with blurred vision that impaired her vision and couldn't read the numbers in the clock, vertigo described as room spinning, and when she tried to walk to the bathroom she was very unstable and had to be help by her husband. EMS was summoned and she vomited in her way to the ED.Denies HA, difficulty swallowing, focal weakness or numbness, slurred speech, confusion, or language impairment. CT brain showed no acute abnormality     Clinical Impression   Pt admitted with above.  Pt requiring supervision for functional mobility within room with use of RW due to c/o dizziness.  Pt reports she "still does not feel quite right" but denies blurring or double vision.  Will continue to follow to ensure safety with functional mobility and during ADLs (please see below problem list).    OT Assessment  Patient needs continued OT Services    Follow Up Recommendations  Supervision/Assistance - 24 hour    Barriers to Discharge      Equipment Recommendations  None recommended by OT    Recommendations for Other Services    Frequency  Min 2X/week    Precautions / Restrictions     Pertinent Vitals/Pain See vitals    ADL  Grooming: Performed;Wash/dry hands;Supervision/safety Where Assessed - Grooming: Unsupported standing Upper Body Bathing: Simulated;Supervision/safety Where Assessed - Upper Body Bathing: Unsupported sitting Lower Body Bathing:  Simulated;Supervision/safety Where Assessed - Lower Body Bathing: Unsupported sit to stand Upper Body Dressing: Simulated;Supervision/safety Where Assessed - Upper Body Dressing: Unsupported sitting Lower Body Dressing: Performed;Supervision/safety Where Assessed - Lower Body Dressing: Unsupported sit to stand Toilet Transfer: Performed;Supervision/safety Toilet Transfer Method: Sit to Loss adjuster, chartered: Regular height toilet;Grab bars Toileting - Clothing Manipulation and Hygiene: Performed;Supervision/safety Where Assessed - Best boy and Hygiene: Sit to stand from 3-in-1 or toilet Equipment Used: Rolling walker Transfers/Ambulation Related to ADLs: supervision with RW ADL Comments: Pt reports "not feeling right" when sitting EOB and when ambulating in room.  She states she feels slightly dizzy and does not feel safe without use of RW.  Educated pt on visually focusing on a stationary object in room when performing transfers/mobility to help with dizziness.  Pt with no LOB but very cautious with mobility. Donned shoes by crossing ankles over knees sitting EOB, no assist needed.     OT Diagnosis: Generalized weakness (balance deficits)  OT Problem List: Impaired balance (sitting and/or standing) OT Treatment Interventions: Self-care/ADL training;Therapeutic activities;Patient/family education   OT Goals(Current goals can be found in the care plan section) Acute Rehab OT Goals Patient Stated Goal: to go home OT Goal Formulation: With patient Time For Goal Achievement: 05/11/13 Potential to Achieve Goals: Good  Visit Information  Last OT Received On: 05/04/13 Assistance Needed: +1 History of Present Illness: Becky Mccall is an 80 y.o. female with a past medical history significant for HTN, hyperlipidemia, heart failure, COPD, arthritis, brought in by EMS due to acute onset of the above stated symptoms.  Prior [place] expects to be discharged to:: Private residence Living Arrangements: Spouse/significant other Available Help at Discharge: Family;Available 24 hours/day Type of Home: House Home Access: Stairs to enter [employer] of Steps: 1 Entrance Stairs-Rails: Right Home Layout: One level Home Equipment: Walker - 2 wheels;Walker - 4 wheels;Tub bench;Adaptive equipment;Bedside commode Adaptive Equipment: Reacher Prior Function Level of Independence: Independent Comments: Back sx last month. Communication Communication: No difficulties Dominant Hand: Right         Vision/Perception Vision - History Baseline Vision: No visual deficits Patient Visual Report:  (c/o dizziness) Vision - Assessment Vision Assessment: Vision tested Tracking/Visual Pursuits: Impaired - to be further tested in functional context Saccades: Decreased speed of saccadic movement Additional Comments: Noted slight nystagmus with tracking.  Pt reports feeling dizzy with mobility and when nodding/shaking head.     Cognition  Cognition Arousal/Alertness: Awake/alert Behavior During Therapy: WFL for tasks assessed/performed Overall Cognitive Status: Within Functional Limits for tasks assessed    Extremity/Trunk Assessment Upper Extremity Assessment Upper Extremity Assessment: Overall WFL for tasks assessed     Mobility Bed Mobility Overal bed mobility: Modified Independent Transfers Overall transfer level: Needs assistance Equipment used: Rolling walker (2 wheeled) Transfers: Sit to/from Stand Sit to Stand: Supervision General transfer comment: Supervision only for safety due to slight dizziness.     Exercise     Balance Balance Overall balance assessment: Needs assistance Sitting-balance support: No upper extremity supported Sitting balance-Leahy Scale: Fair Sitting balance - Comments: Fair without use of RW.   End of Session OT - End of Session Equipment Utilized During  Treatment: Rolling walker Activity Tolerance: Patient tolerated treatment well Patient left: in bed;with call bell/phone within reach;with nursing/sitter in room Nurse Communication: Mobility status  GO Functional Assessment Tool Used: clinical judgment Functional Limitation: Self care Self Care Current Status (J1914): At least 1 percent but less than 20 percent impaired, limited or restricted Self Care Goal Status [phone]): 0 percent impaired, limited or restricted   05/04/2013 Darrol Jump OTR/L Pager [phone] Office [phone]  Darrol Jump 05/04/2013, 11:49 AM

## 2013-05-04 NOTE — ED Notes (Signed)
The patient complained of dizziness and feeling weak; during orthostatic vitals (standing) with two assist. The patients oxygen saturation dropped to 96% while standing. The tech has reported to the RN in charge.

## 2013-05-04 NOTE — ED Notes (Signed)
Report Attempted

## 2013-05-04 NOTE — ED Provider Notes (Signed)
CSN: [id number]     Arrival date & time 05/04/13  0052 History   First MD Initiated Contact with Patient 05/04/13 [phone]     Chief Complaint  Patient presents with  . Dizziness     (Consider location/radiation/quality/duration/timing/severity/associated sxs/prior Treatment) HPI  Patient is an 80 yo woman who awoke from sleep feeling hot around 2330. She looked at her clock but could not read the numbers due to blurred vision. The blurred vision lasted about 39m and resolved without intervention. The patient had associated ataxia and says she required her husband's assistance to ambulate to the bathroom. Once there, she asked him to call 911. The patient vomited twice. No h/o similar sx. No headache. No fever. No recent med changes.   Patient reports chronic DOE but says she has not had any change in this sx.     Past Medical History  Diagnosis Date  . Hard of hearing     wears hearing aides both ears  . Intermittent dysphagia     for pills and solids  . Arthritis   . Hyperlipidemia     can't take the meds so takes Fish Oil  . Heart murmur     sees Dr Fransico Him  . Abrasion of left lower extremity 12/2012    draining wound on LLL;  . H/O heart failure 2004    evaluated by Dr [name]; no sequela  . Aortic stenosis     mild AR/AS by 10/05/12 echo (Dr. Fransico Him)  . Hypertension     takes Losartan-HCTZ daily  . COPD (chronic obstructive pulmonary disease)     Spiriva inhaler daily  . Hypothyroidism     takes Synthroid daily  . GERD (gastroesophageal reflux disease)     occasionally Rolaids or Tums  . Shortness of breath     with exertion  . Chest congestion     was on ZPAK and completed on 02/17/13   Past Surgical History  Procedure Laterality Date  . Lumbar laminectomy  1999  . Abdominal hysterectomy    . Hemorroidectomy    . Eye surgery    . Cataract extraction w/ intraocular lens  implant, bilateral  1999,2000  . Back surgery  2009   No family history on  file. History  Substance Use Topics  . Smoking status: Former Smoker -- 3.00 packs/day for 40 years    Types: Cigarettes  . Smokeless tobacco: Never Used     Comment: quit in June 11,1999  . Alcohol Use: 3.5 oz/week    7 drink(s) per week   OB History   Grav Para Term Preterm Abortions TAB SAB Ect Mult Living                 Review of Systems Ten point review of symptoms performed and is negative with the exception of symptoms noted above.       Allergies  Statins  Home Medications   Current Outpatient Rx  Name  Route  Sig  Dispense  Refill  . aspirin EC 81 MG tablet   Oral   Take 162 mg by mouth daily.         . Calcium Carb-Cholecalciferol (CALCIUM 1000 + D PO)   Oral   Take 1 tablet by mouth 2 (two) times daily.         . Cholecalciferol (VITAMIN D) 2000 UNITS tablet   Oral   Take 2,000 Units by mouth 3 (three) times daily.         Marland Kitchen  Cinnamon 500 MG capsule   Oral   Take 500 mg by mouth 3 (three) times daily.         . fish oil-omega-3 fatty acids 1000 MG capsule   Oral   Take 1 g by mouth 3 (three) times daily.         Marland Kitchen levothyroxine (SYNTHROID, LEVOTHROID) 137 MCG tablet   Oral   Take 137 mcg by mouth daily before breakfast.         . losartan-hydrochlorothiazide (HYZAAR) 100-25 MG per tablet   Oral   Take 1 tablet by mouth daily.         . Magnesium 500 MG TABS   Oral   Take 2 tablets by mouth daily.         . Multiple Vitamin (MULTIVITAMIN WITH MINERALS) TABS tablet   Oral   Take 1 tablet by mouth daily.         . naproxen (NAPROSYN) 500 MG tablet   Oral   Take 500 mg by mouth 2 (two) times daily with a meal.         . Pyridoxine HCl (VITAMIN B-6 PO)   Oral   Take 1 tablet by mouth daily.          Marland Kitchen tiotropium (SPIRIVA) 18 MCG inhalation capsule   Inhalation   Place 18 mcg into inhaler and inhale daily.         . vitamin B-12 (CYANOCOBALAMIN) 1000 MCG tablet   Oral   Take 1,000 mcg by mouth daily.          . vitamin C (ASCORBIC ACID) 500 MG tablet   Oral   Take 500 mg by mouth daily.         . vitamin E 400 UNIT capsule   Oral   Take 400 Units by mouth 2 (two) times daily.          BP 158/71  Pulse 82  Temp(Src) 97.7 F (36.5 C) (Oral)  Resp 100  Ht 5\' 7"  (1.702 m)  Wt 181 lb (82.101 kg)  BMI 28.34 kg/m2  SpO2 100% Physical Exam Gen: well developed and well nourished appearing Head: NCAT Eyes: PERL, EOMI, few beats of lateral nystagmus, subtle rotational nystagmus noted as well.  Nose: no epistaixis or rhinorrhea Mouth/throat: mucosa is moist and pink Neck: supple, no stridor, no bruit Lungs: CTA B, no wheezing, bibasilar rales - right > left.  CV: RRR, no murmur, extremities appear well perfused.  Abd: soft, notender, nondistended Back: no ttp, no cva ttp DRE: stool is brown and heme negative.  Skin: warm and dry Ext: normal to inspection, no dependent edema Neuro: CN ii-xii grossly intact, no focal deficits Psyche; normal affect,  calm and cooperative.   Bibasilar rales.  ED Course  Procedures (including critical care time) Labs Review  Results for orders placed during the hospital encounter of 05/04/13 (from the past 24 hour(s))  BASIC METABOLIC PANEL     Status: Abnormal   Collection Time    05/04/13  3:01 AM      Result Value Ref Range   Sodium 143  137 - 147 mEq/L   Potassium 4.4  3.7 - 5.3 mEq/L   Chloride 103  96 - 112 mEq/L   CO2 26  19 - 32 mEq/L   Glucose, Bld 124 (*) 70 - 99 mg/dL   BUN 27 (*) 6 - 23 mg/dL   Creatinine, Ser 1.03  0.50 - 1.10 mg/dL   Calcium  9.3  8.4 - 10.5 mg/dL   GFR calc non Af Amer 50 (*) >90 mL/min   GFR calc Af Amer 57 (*) >90 mL/min  CBG MONITORING, ED     Status: Abnormal   Collection Time    05/04/13  3:05 AM      Result Value Ref Range   Glucose-Capillary 121 (*) 70 - 99 mg/dL  I-STAT TROPOININ, ED     Status: None   Collection Time    05/04/13  3:09 AM      Result Value Ref Range   Troponin i, poc 0.01  0.00 -  0.08 ng/mL   Comment 3           URINALYSIS, ROUTINE W REFLEX MICROSCOPIC     Status: Abnormal   Collection Time    05/04/13  3:28 AM      Result Value Ref Range   Color, Urine YELLOW  YELLOW   APPearance CLOUDY (*) CLEAR   Specific Gravity, Urine 1.017  1.005 - 1.030   pH 7.5  5.0 - 8.0   Glucose, UA NEGATIVE  NEGATIVE mg/dL   Hgb urine dipstick NEGATIVE  NEGATIVE   Bilirubin Urine NEGATIVE  NEGATIVE   Ketones, ur NEGATIVE  NEGATIVE mg/dL   Protein, ur NEGATIVE  NEGATIVE mg/dL   Urobilinogen, UA 0.2  0.0 - 1.0 mg/dL   Nitrite NEGATIVE  NEGATIVE   Leukocytes, UA NEGATIVE  NEGATIVE   EKG: nsr, no acute ischemic changes, normal intervals, normal axis, normal qrs complex   MDM   ED work up of transient blurred vision and vertigo with ataxia is notable for normocytic anemia with heme negative stool. PE is notable for negative [employer] and rotational nystagmus - both raising the concern that this may have been a posterior circulation TIA. We are awaiting results of CT scan. Anticipate admission for MRI and stroke work up.   0604: CT brain shows NAICP. Case discussed with Dr. Posey Pronto who will admit the patient and consult neurology. We will tx with ASA here in the ED.   Elyn Peers, MD 05/04/13 [phone]

## 2013-05-04 NOTE — H&P (Signed)
[facility] History and Physical  Patient: Becky Mccall  KGY:[id number]  DOB: 20-Nov-1929  DOS: the patient was seen and examined on 05/04/2013 PCP: Vidal Schwalbe, MD  Chief Complaint: Vertigo  HPI: [name] is a 80 y.o. female with Past medical history of hypertension, dyslipidemia, aortic stenosis, COPD, hearing difficulty. The patient is coming from home The patient presented with complaints of vertigo. She mentions she went to sleep at around 9:30 and woke up at roughly 11:30 at which time she was having a sensation of things around her spinning with dizziness and nausea. She tried to lay down on the bed but the dizziness was not improving and was even present with closeD EYES. She tried to look at celing watch, but could not see anything clearly due to blurring of the vision which persisted for 30 minutes. She woke her husband to have her to go to the restroom, why she was in the restroom she had an episode of vomiting and persistent dizziness or which she called EMS. She continued to remain busy until her arrival to the ED. Her symptoms improved significantly and resolved on my evaluation. They have reoccurred when she went for CT scan and move her head. She denies any similar symptoms in the past no chest pain or shortness of breath no fever no chills no nausea no vomiting no diarrhea no constipation. She was placed on gabapentin recently which she stopped taking it a few days ago due to persistent restless legs.  Review of Systems: as mentioned in the history of present illness.  A Comprehensive review of the other systems is negative.  Past Medical History  Diagnosis Date  . Hard of hearing     wears hearing aides both ears  . Intermittent dysphagia     for pills and solids  . Arthritis   . Hyperlipidemia     can't take the meds so takes Fish Oil  . Heart murmur     sees Dr Fransico Him  . Abrasion of left lower extremity 12/2012    draining wound on  LLL;  . H/O heart failure 2004    evaluated by Dr [name]; no sequela  . Aortic stenosis     mild AR/AS by 10/05/12 echo (Dr. Fransico Him)  . Hypertension     takes Losartan-HCTZ daily  . COPD (chronic obstructive pulmonary disease)     Spiriva inhaler daily  . Hypothyroidism     takes Synthroid daily  . GERD (gastroesophageal reflux disease)     occasionally Rolaids or Tums  . Shortness of breath     with exertion  . Chest congestion     was on ZPAK and completed on 02/17/13   Past Surgical History  Procedure Laterality Date  . Lumbar laminectomy  1999  . Abdominal hysterectomy    . Hemorroidectomy    . Eye surgery    . Cataract extraction w/ intraocular lens  implant, bilateral  1999,2000  . Back surgery  2009   Social History:  reports that she has quit smoking. Her smoking use included Cigarettes. She has a 120 pack-year smoking history. She has never used smokeless tobacco. She reports that she drinks about 3.5 ounces of alcohol per week. She reports that she does not use illicit drugs. Independent for most of her  ADL.  Allergies  Allergen Reactions  . Statins     Muscle pain    No family history on file.  Prior to Admission medications  Medication Sig Start Date End Date Taking? Authorizing Provider  aspirin EC 81 MG tablet Take 162 mg by mouth daily.   Yes Historical Provider, MD  Calcium Carb-Cholecalciferol (CALCIUM 1000 + D PO) Take 1 tablet by mouth 2 (two) times daily.   Yes Historical Provider, MD  Cholecalciferol (VITAMIN D) 2000 UNITS tablet Take 2,000 Units by mouth 3 (three) times daily.   Yes Historical Provider, MD  Cinnamon 500 MG capsule Take 500 mg by mouth 3 (three) times daily.   Yes Historical Provider, MD  fish oil-omega-3 fatty acids 1000 MG capsule Take 1 g by mouth 3 (three) times daily.   Yes Historical Provider, MD  levothyroxine (SYNTHROID, LEVOTHROID) 137 MCG tablet Take 137 mcg by mouth daily before breakfast.   Yes Historical Provider,  MD  losartan-hydrochlorothiazide (HYZAAR) 100-25 MG per tablet Take 1 tablet by mouth daily.   Yes Historical Provider, MD  Magnesium 500 MG TABS Take 2 tablets by mouth daily.   Yes Historical Provider, MD  Multiple Vitamin (MULTIVITAMIN WITH MINERALS) TABS tablet Take 1 tablet by mouth daily.   Yes Historical Provider, MD  naproxen (NAPROSYN) 500 MG tablet Take 500 mg by mouth 2 (two) times daily with a meal.   Yes Historical Provider, MD  Pyridoxine HCl (VITAMIN B-6 PO) Take 1 tablet by mouth daily.    Yes Historical Provider, MD  tiotropium (SPIRIVA) 18 MCG inhalation capsule Place 18 mcg into inhaler and inhale daily.   Yes Historical Provider, MD  vitamin B-12 (CYANOCOBALAMIN) 1000 MCG tablet Take 1,000 mcg by mouth daily.   Yes Historical Provider, MD  vitamin C (ASCORBIC ACID) 500 MG tablet Take 500 mg by mouth daily.   Yes Historical Provider, MD  vitamin E 400 UNIT capsule Take 400 Units by mouth 2 (two) times daily.   Yes Historical Provider, MD    Physical Exam: Filed Vitals:   05/04/13 0315 05/04/13 0345 05/04/13 0415 05/04/13 0448  BP: 108/71 144/62 140/54 153/67  Pulse: 83 79 83 85  Temp:      TempSrc:      Resp: 15 17 18    Height:      Weight:      SpO2: 99% 98% 93% 88%    General: Alert, Awake and Oriented to Time, Place and Person. Appear in mild distress Eyes: PERRL, horizontal nystagmus on right ENT: Oral Mucosa clear moist. Neck: no JVD Cardiovascular: S1 and S2 Present, aortic systolic Murmur, Peripheral Pulses Present Respiratory: Bilateral Air entry equal and Decreased, Clear to Auscultation,  no Crackles,no wheezes Abdomen: Bowel Sound Present, Soft and Non tender Skin: no Rash Extremities: no Pedal edema, no calf tenderness Neurologic: Grossly Unremarkable.  Labs on Admission:  CBC:  Recent Labs Lab 05/04/13 0301  WBC 4.7  NEUTROABS 3.4  HGB 8.7*  HCT 29.0*  MCV 80.1  PLT 244    CMP     Component Value Date/Time   NA 143 05/04/2013 0301    K 4.4 05/04/2013 0301   CL 103 05/04/2013 0301   CO2 26 05/04/2013 0301   GLUCOSE 124* 05/04/2013 0301   BUN 27* 05/04/2013 0301   CREATININE 1.03 05/04/2013 0301   CALCIUM 9.3 05/04/2013 0301   GFRNONAA 50* 05/04/2013 0301   GFRAA 57* 05/04/2013 0301    No results found for this basename: LIPASE, AMYLASE,  in the last 168 hours No results found for this basename: AMMONIA,  in the last 168 hours  No results found for this basename: CKTOTAL, CKMB,  CKMBINDEX, TROPONINI,  in the last 168 hours BNP (last 3 results) No results found for this basename: PROBNP,  in the last 8760 hours  Radiological Exams on Admission: Ct Head Wo Contrast  05/04/2013   CLINICAL DATA:  Dizziness, blurry vision, unable to walk.  EXAM: CT HEAD WITHOUT CONTRAST  TECHNIQUE: Contiguous axial images were obtained from the base of the skull through the vertex without intravenous contrast.  COMPARISON:  None.  FINDINGS: Mild periventricular and subcortical white matter hypodensities, a nonspecific finding often seen in the setting of chronic microangiopathic change. No definite CT evidence of an acute infarction. No hydrocephalus. The ventricles, cisterns, and sulci are normal in size, shape, and position. No intraparenchymal hemorrhage, mass, mass effect, or abnormal extra-axial fluid collection. The visualized paranasal sinuses and mastoid air cells are predominantly clear.  IMPRESSION: Mild white matter changes. No CT evidence of an acute intracranial abnormality.  Recommend MRI if concern for acute ischemia persists.   Electronically Signed   By: Carlos Levering M.D.   On: 05/04/2013 05:17   Dg Chest Port 1 View  05/04/2013   CLINICAL DATA:  Vertigo, chronic shortness of breath  EXAM: PORTABLE CHEST - 1 VIEW  COMPARISON:  12/29/2012  FINDINGS: The heart size and mediastinal contours are within normal limits. Mild lung base opacities. No pleural effusion or pneumothorax. The visualized skeletal structures are unremarkable.   IMPRESSION: Mild lung base opacities ; atelectasis/scarring versus infiltrate.   Electronically Signed   By: Carlos Levering M.D.   On: 05/04/2013 05:41    EKG: Independently reviewed. normal EKG, normal sinus rhythm, nonspecific ST and T waves changes.  Assessment/Plan Principal Problem:   Vertigo Active Problems:   Hard of hearing   Hyperlipidemia   Aortic stenosis   COPD (chronic obstructive pulmonary disease)   Hypothyroidism   GERD (gastroesophageal reflux disease)   1. Vertigo The patient is presenting with complaints of vertigo. She had mild diplopia on right and horizontal nystagmus on right on my examination other than that she did not have any significant abnormality. At the time of my evaluation she was out of the window period for any TPA intervention. I recommended to obtain neurologic consult. Patient will be admitted to the hospital for further workup including MRI of the brain MRA of the brain, echocardiogram carotid Doppler. At present I would continue her on aspirin and consult neurology for further intervention. PTOT consultation. Stroke evaluation for speech and swallowing  2. Aortic stenosis Hypertension Echocardiogram, continue home antihypertensive medications  3. Hypothyroidism Continue Synthroid  Consults: Neurology  DVT Prophylaxis: subcutaneous Heparin Nutrition: N.p.o.  Code Status: Full  Family Communication: Family was present at bedside, opportunity was given to ask question and all questions were answered satisfactorily at the time of interview. Disposition: Admitted to observation in telemetry unit.  Author: Berle Mull, MD [facility] Pager: [phone] 05/04/2013, 6:33 AM    If 7PM-7AM, please contact night-coverage [email or web address] Password [email or web address]

## 2013-05-05 DIAGNOSIS — I10 Essential (primary) hypertension: Secondary | ICD-10-CM

## 2013-05-05 DIAGNOSIS — E785 Hyperlipidemia, unspecified: Secondary | ICD-10-CM

## 2013-05-05 DIAGNOSIS — G459 Transient cerebral ischemic attack, unspecified: Secondary | ICD-10-CM

## 2013-05-05 LAB — GLUCOSE, CAPILLARY: Glucose-Capillary: 89 mg/dL (ref 70–99)

## 2013-05-05 LAB — LIPID PANEL
CHOL/HDL RATIO: 4.4 ratio
Cholesterol: 226 mg/dL — ABNORMAL HIGH (ref 0–200)
HDL: 51 mg/dL (ref >39)
LDL Cholesterol: 145 mg/dL — ABNORMAL HIGH (ref 0–99)
Triglycerides: 149 mg/dL (ref <150)
VLDL: 30 mg/dL (ref 0–40)

## 2013-05-05 MED ORDER — CLOPIDOGREL BISULFATE 75 MG PO TABS: 75.0000 mg | ORAL_TABLET | Freq: Every day | ORAL | Status: DC

## 2013-05-05 MED ORDER — TRAMADOL HCL 50 MG PO TABS: 50.0000 mg | ORAL_TABLET | Freq: Three times a day (TID) | ORAL | Status: DC | PRN

## 2013-05-05 MED ORDER — MECLIZINE HCL 25 MG PO TABS: 25.0000 mg | ORAL_TABLET | Freq: Three times a day (TID) | ORAL | Status: DC | PRN

## 2013-05-05 MED ORDER — STROKE: EARLY STAGES OF RECOVERY BOOK: 1.0000 | Freq: Once | Status: DC

## 2013-05-05 MED ORDER — PANTOPRAZOLE SODIUM 40 MG PO TBEC: 40.0000 mg | DELAYED_RELEASE_TABLET | Freq: Every day | ORAL | Status: DC

## 2013-05-05 MED ORDER — OMEGA-3-ACID ETHYL ESTERS 1 G PO CAPS: 2.0000 g | ORAL_CAPSULE | Freq: Three times a day (TID) | ORAL | Status: DC

## 2013-05-05 NOTE — Discharge Summary (Signed)
Physician Discharge Summary  Becky Mccall HFG:[id number] DOB: March 31, 1929 DOA: 05/04/2013  PCP: Vidal Schwalbe, MD  Admit date: 05/04/2013 Discharge date: 05/05/2013  Time spent: >30 minutes  Recommendations for Outpatient Follow-up:  1. BMET to follow electrolytes and renal function 2. Reassess BP and adjust medications as needed 3. Follow patient response to meclizine and vestibular training  4. Anemia work up as an outpatient. Repeat CBC to follow Hgb trend  Discharge Diagnoses:  Principal Problem:   Vertigo Active Problems:   Hard of hearing   Hyperlipidemia   Aortic stenosis   COPD (chronic obstructive pulmonary disease)   Hypothyroidism   GERD (gastroesophageal reflux disease)   Discharge Condition: stable and improved. Will be discharge home with outpatient referral for vestibular training and follow up with PCP in 10 days.   Diet recommendation: low sodium and low fat diet   Filed Weights   05/04/13 0108 05/04/13 0922  Weight: 82.101 kg (181 lb) 81.647 kg (180 lb)    History of present illness:  80 y.o. female with Past medical history of hypertension, dyslipidemia, aortic stenosis, COPD, hearing difficulty.  The patient is coming from home  The patient presented with complaints of vertigo. She mentions she went to sleep at around 9:30 and woke up at roughly 11:30 at which time she was having a sensation of things around her spinning with dizziness and nausea. She tried to lay down on the bed but the dizziness was not improving and was even present with closeD EYES.  She tried to look at celing watch, but could not see anything clearly due to blurring of the vision which persisted for 30 minutes.  She woke her husband to have her to go to the restroom, why she was in the restroom she had an episode of vomiting and persistent dizziness or which she called EMS. She continued to remain busy until her arrival to the ED.  Her symptoms improved significantly and resolved  on my evaluation.  They have reoccurred when she went for CT scan and move her head.  She denies any similar symptoms in the past no chest pain or shortness of breath no fever no chills no nausea no vomiting no diarrhea no constipation.  She was placed on gabapentin recently which she stopped taking it a few days ago due to persistent restless legs.   Hospital Course:  1-Vertigo/TIA: no stroke appreciated on work up. -given risk factor and concerns for TIA -ASA changed to plavix for secondary prevention -continue risk factors modifications  2-Vertigo: patient positive for vertigo with vestibular maneuvers by PT/OT. -will start PRN meclizine and referred for outpatient vestibular training   3-HTN: stable. Continue current medication regimen and low sodium diet  4-HLD: with statins intolerance/allergy -will change fish oil to lovaza 2G TID -advise to follow lo fate diet  5-hypothyroidism: continue synthroid  6-GERD: started on PPI -advised to quit using NSAID's  7-Anemia: Hgb 8.7; will need outpatient work up for anemia -no overt bleeding appreciated during admission.  8-COPD: stable. Continue home medication regimen.  *the rest of patient medical problems remains stable and the plan is to continue current medication regimen.  Procedures:  See below for x-ray reports  Carotid duplex: vertebral flow antegrade, positive mild ICA 1-39% bilaterally  2-D echo: - Left ventricle: The cavity size was normal. Wall thickness was increased in a pattern of mild LVH. The estimated ejection fraction was 65%. Wall motion was normal; there were no regional wall motion abnormalities. - Aortic valve:  Moderately thickened leaflets. There is mild/moderate aortic stenosis. - Mitral valve: Calcified annulus. - Left atrium: The atrium was mildly dilated. - Right ventricle: The cavity size was mildly dilated. Systolic function was normal.   Consultations:  Neurology  Discharge  Exam: Filed Vitals:   05/05/13 0800  BP: 137/61  Pulse: 86  Temp: 97.7 F (36.5 C)  Resp: 18    General: NAD, afebrile, denies any dizziness Cardiovascular: S1 and S2, no rubs or gallops Respiratory: CTA bilaterally Abdomen: soft, NT, ND, positive BS Extremities: no edema or cyanosis Neurologic: non focal deficit  Discharge Instructions  Discharge Orders   Future Appointments Provider Department Dept Phone   10/08/2013 11:30 AM [facility] Echo [facility] [address] [phone]   Future Orders Complete By Expires   Ambulatory referral to Physical Therapy  As directed    Comments:     Vestibular training. Patient with vertigo   Questions:     Iontophoresis - 4 mg/ml of dexamethasone:     T.E.N.S. Unit Evaluation and Dispense as Indicated:     Diet - low sodium heart healthy  As directed    Discharge instructions  As directed    Comments:     Take medications as prescribed Follow a low fat diet Arrange follow up with PCP in 10 days Use tylenol 1000 mg up to three times a day, every 8 hours as needed for pain       Medication List    STOP taking these medications       aspirin EC 81 MG tablet     fish oil-omega-3 fatty acids 1000 MG capsule     naproxen 500 MG tablet  Commonly known as:  NAPROSYN      TAKE these medications        stroke: mapping our early stages of recovery book Misc  1 each by Does not apply route once.     CALCIUM 1000 + D PO  Take 1 tablet by mouth 2 (two) times daily.     Cinnamon 500 MG capsule  Take 500 mg by mouth 3 (three) times daily.     clopidogrel 75 MG tablet  Commonly known as:  PLAVIX  Take 1 tablet (75 mg total) by mouth daily with breakfast.     levothyroxine 137 MCG tablet  Commonly known as:  SYNTHROID, LEVOTHROID  Take 137 mcg by mouth daily before breakfast.     losartan-hydrochlorothiazide 100-25 MG per tablet  Commonly known as:  HYZAAR  Take 1 tablet by mouth daily.     Magnesium  500 MG Tabs  Take 2 tablets by mouth daily.     meclizine 25 MG tablet  Commonly known as:  ANTIVERT  Take 1 tablet (25 mg total) by mouth 3 (three) times daily as needed for dizziness.     multivitamin with minerals Tabs tablet  Take 1 tablet by mouth daily.     omega-3 acid ethyl esters 1 G capsule  Commonly known as:  LOVAZA  Take 2 capsules (2 g total) by mouth 3 (three) times daily.     pantoprazole 40 MG tablet  Commonly known as:  PROTONIX  Take 1 tablet (40 mg total) by mouth daily.     tiotropium 18 MCG inhalation capsule  Commonly known as:  SPIRIVA  Place 18 mcg into inhaler and inhale daily.     traMADol 50 MG tablet  Commonly known as:  ULTRAM  Take 1 tablet (50  mg total) by mouth every 8 (eight) hours as needed (pain not relieved by tylenol).     vitamin B-12 1000 MCG tablet  Commonly known as:  CYANOCOBALAMIN  Take 1,000 mcg by mouth daily.     VITAMIN B-6 PO  Take 1 tablet by mouth daily.     vitamin C 500 MG tablet  Commonly known as:  ASCORBIC ACID  Take 500 mg by mouth daily.     Vitamin D 2000 UNITS tablet  Take 2,000 Units by mouth 3 (three) times daily.     vitamin E 400 UNIT capsule  Take 400 Units by mouth 2 (two) times daily.       Allergies  Allergen Reactions  . Statins     Muscle pain       Follow-up Information   Follow up with WHITE,CYNTHIA S, MD. Schedule an appointment as soon as possible for a visit in 10 days.   Specialty:  Family Medicine   Contact information:   [address], [place] [address] [phone]       The results of significant diagnostics from this hospitalization (including imaging, microbiology, ancillary and laboratory) are listed below for reference.    Significant Diagnostic Studies: Ct Head Wo Contrast  05/04/2013   CLINICAL DATA:  Dizziness, blurry vision, unable to walk.  EXAM: CT HEAD WITHOUT CONTRAST  TECHNIQUE: Contiguous axial images were obtained from the base of the  skull through the vertex without intravenous contrast.  COMPARISON:  None.  FINDINGS: Mild periventricular and subcortical white matter hypodensities, a nonspecific finding often seen in the setting of chronic microangiopathic change. No definite CT evidence of an acute infarction. No hydrocephalus. The ventricles, cisterns, and sulci are normal in size, shape, and position. No intraparenchymal hemorrhage, mass, mass effect, or abnormal extra-axial fluid collection. The visualized paranasal sinuses and mastoid air cells are predominantly clear.  IMPRESSION: Mild white matter changes. No CT evidence of an acute intracranial abnormality.  Recommend MRI if concern for acute ischemia persists.   Electronically Signed   By:  Levering M.D.   On: 05/04/2013 05:17   Mri Brain Without Contrast  05/04/2013   CLINICAL DATA:  Dizziness.  Blurred vision.  Gait disturbance.  EXAM: MRI HEAD WITHOUT CONTRAST  MRA HEAD WITHOUT CONTRAST  TECHNIQUE: Multiplanar, multiecho pulse sequences of the brain and surrounding structures were obtained without intravenous contrast. Angiographic images of the head were obtained using MRA technique without contrast.  COMPARISON:  Head CT same day  FINDINGS: MRI HEAD FINDINGS  Diffusion imaging does not show any acute or subacute infarction. There chronic small vessel changes of the pons. The cerebral hemispheres show mild chronic small-vessel change affecting the deep and subcortical white matter. No cortical or large vessel territory infarction. No mass lesion, hemorrhage, hydrocephalus or extra-axial collection. No pituitary mass. No inflammatory sinus disease. No skull or skullbase lesion.  MRA HEAD FINDINGS  Both internal carotid arteries are widely patent into the brain. On the right, there is a 2 mm aneurysm or infundibulum at the superior hypophyseal or posterior communicating artery origin. On the left, there is a 2 mm aneurysm projecting laterally from the proximal carotid  siphon. There is a 3 mm infundibulum or aneurysm at the superior hypophyseal artery.  The anterior and middle cerebral arteries are patent bilaterally but there is a 50% stenosis in the M1 segment on the left. Both vertebral arteries are widely patent to the basilar. No basilar stenosis. Posterior circulation branch  vessels are normal.  IMPRESSION: No acute infarction. Mild chronic small-vessel change of the pons in the cerebral hemispheric white matter.  50% stenosis of the left M1 segment.  2 mm aneurysm projecting laterally from the proximal carotid siphon on the left. Small infundibulae versus aneurysms (2-3 mm) at both superior hypophyseal artery or posterior communicating regions.   Electronically Signed   By: Nelson Chimes M.D.   On: 05/04/2013 09:34   Dg Chest Port 1 View  05/04/2013   CLINICAL DATA:  Vertigo, chronic shortness of breath  EXAM: PORTABLE CHEST - 1 VIEW  COMPARISON:  12/29/2012  FINDINGS: The heart size and mediastinal contours are within normal limits. Mild lung base opacities. No pleural effusion or pneumothorax. The visualized skeletal structures are unremarkable.  IMPRESSION: Mild lung base opacities ; atelectasis/scarring versus infiltrate.   Electronically Signed   By: Temesgen Weightman Levering M.D.   On: 05/04/2013 05:41   Mr Jodene Nam Head/brain Wo Cm  05/04/2013   CLINICAL DATA:  Dizziness.  Blurred vision.  Gait disturbance.  EXAM: MRI HEAD WITHOUT CONTRAST  MRA HEAD WITHOUT CONTRAST  TECHNIQUE: Multiplanar, multiecho pulse sequences of the brain and surrounding structures were obtained without intravenous contrast. Angiographic images of the head were obtained using MRA technique without contrast.  COMPARISON:  Head CT same day  FINDINGS: MRI HEAD FINDINGS  Diffusion imaging does not show any acute or subacute infarction. There chronic small vessel changes of the pons. The cerebral hemispheres show mild chronic small-vessel change affecting the deep and subcortical white matter. No cortical  or large vessel territory infarction. No mass lesion, hemorrhage, hydrocephalus or extra-axial collection. No pituitary mass. No inflammatory sinus disease. No skull or skullbase lesion.  MRA HEAD FINDINGS  Both internal carotid arteries are widely patent into the brain. On the right, there is a 2 mm aneurysm or infundibulum at the superior hypophyseal or posterior communicating artery origin. On the left, there is a 2 mm aneurysm projecting laterally from the proximal carotid siphon. There is a 3 mm infundibulum or aneurysm at the superior hypophyseal artery.  The anterior and middle cerebral arteries are patent bilaterally but there is a 50% stenosis in the M1 segment on the left. Both vertebral arteries are widely patent to the basilar. No basilar stenosis. Posterior circulation branch vessels are normal.  IMPRESSION: No acute infarction. Mild chronic small-vessel change of the pons in the cerebral hemispheric white matter.  50% stenosis of the left M1 segment.  2 mm aneurysm projecting laterally from the proximal carotid siphon on the left. Small infundibulae versus aneurysms (2-3 mm) at both superior hypophyseal artery or posterior communicating regions.   Electronically Signed   By: Nelson Chimes M.D.   On: 05/04/2013 09:34   Labs: Basic Metabolic Panel:  Recent Labs Lab 05/04/13 0301  NA 143  K 4.4  CL 103  CO2 26  GLUCOSE 124*  BUN 27*  CREATININE 1.03  CALCIUM 9.3   CBC:  Recent Labs Lab 05/04/13 0301  WBC 4.7  NEUTROABS 3.4  HGB 8.7*  HCT 29.0*  MCV 80.1  PLT 244   CBG:  Recent Labs Lab 05/04/13 0305 05/04/13 1835 05/04/13 2113 05/05/13 0801  GLUCAP 121* 132* 100* 89    Signed:  Zerita Boers  [facility] 05/05/2013, 8:57 AM

## 2013-05-05 NOTE — Progress Notes (Signed)
Utilization review completed.  P.J. Hasel Janish,RN,BSN Case Manager [phone]  

## 2013-05-05 NOTE — Progress Notes (Signed)
PT Cancellation Note  Patient Details Name: Becky Mccall MRN: [id number] DOB: Jul 07, 1929   Cancelled Treatment:     Attempted to see patient at 8:04.  Patient eating breakfast.  Reports she is not having dizziness this morning.  Returned to see patient at 11:13 - patient had been discharged.  Patient to have OP PT eval for vestibular rehab.   Despina Pole 05/05/2013, 5:41 PM Carita Pian. Sanjuana Kava, [place] Pager [phone]

## 2013-05-05 NOTE — Progress Notes (Signed)
Stroke Team Progress Note  HISTORY Becky Mccall is an 80 y.o. female with a past medical history significant for HTN, hyperlipidemia, heart failure, COPD, arthritis, brought in by EMS 05/04/2013 due to acute onset of transient vertigo, blurred vision, and unsteadiness. She said that she went to bed around 915 pm on 05/03/2013 feeling well, but woke up at approx 23:45 that night with blurred vision so she couldn't read the numbers in the clock, vertigo described as room spinning, and when she tried to walk to the bathroom she was very unstable and had to be helped by her husband. EMS was summoned and she vomited on her way to the ED.  Stated that the whole episode lasted about 30 minutes but then had an isolated episode of double vision while being examined in the ED.  Denied HA, difficulty swallowing, focal weakness or numbness, slurred speech, confusion, or language impairment.  CT brain showed no acute abnormality.  Takes 2 baby aspirin daily.   Date last known well: 05/04/13  Time last known well: 915 pm  tPA Given: no, symptoms resolved  NIHSS: 0  MRS: 0   SUBJECTIVE She reports that she is back to baseline. She is had no recurrent symptoms. She tells me that she had the acute onset of spinning like sensation associated with blurring of her vision. She does not report diplopia. She had some gait instability. No focal weakness or numbness is reported. It appears that the spell lasted for an hour before her coming to the hospital and the lasted for another hour although improved. The total events seem to be about a couple of hours. She probably has a history of statin intolerance. She apparently has had severe muscle pain and some muscle weakness with different statins.  OBJECTIVE Most recent Vital Signs: Filed Vitals:   05/04/13 2115 05/05/13 0016 05/05/13 0609 05/05/13 0800  BP: 103/58 117/58 137/64 137/61  Pulse: 72 73 69 86  Temp: 98 F (36.7 C) 98.2 F (36.8 C) 98.4 F (36.9  C) 97.7 F (36.5 C)  TempSrc: Oral Oral Oral Oral  Resp: 16 16 18 18   Height:      Weight:      SpO2: 91% 90% 91% 100%   CBG (last 3)   Recent Labs  05/04/13 1835 05/04/13 2113 05/05/13 0801  GLUCAP 132* 100* 89    IV Fluid Intake:     MEDICATIONS  .  stroke: mapping our early stages of recovery book   Does not apply Once  . clopidogrel  75 mg Oral Q breakfast  . enoxaparin (LOVENOX) injection  40 mg Subcutaneous Q24H  . losartan  100 mg Oral Daily   And  . hydrochlorothiazide  25 mg Oral Daily  . levothyroxine  137 mcg Oral QAC breakfast  . omega-3 acid ethyl esters  2 g Oral BID  . pantoprazole  40 mg Oral Q1200  . tiotropium  18 mcg Inhalation Daily   PRN:  meclizine  Diet:  Cardiac thin liquids Activity:  Bathroom privileges with assistance DVT Prophylaxis:  Lovenox  CLINICALLY SIGNIFICANT STUDIES Basic Metabolic Panel:   Recent Labs Lab 05/04/13 0301  NA 143  K 4.4  CL 103  CO2 26  GLUCOSE 124*  BUN 27*  CREATININE 1.03  CALCIUM 9.3   Liver Function Tests: No results found for this basename: AST, ALT, ALKPHOS, BILITOT, PROT, ALBUMIN,  in the last 168 hours CBC:   Recent Labs Lab 05/04/13 0301  WBC 4.7  NEUTROABS  3.4  HGB 8.7*  HCT 29.0*  MCV 80.1  PLT 244   Coagulation: No results found for this basename: LABPROT, INR,  in the last 168 hours Cardiac Enzymes: No results found for this basename: CKTOTAL, CKMB, CKMBINDEX, TROPONINI,  in the last 168 hours Urinalysis:   Recent Labs Lab 05/04/13 0328  COLORURINE YELLOW  LABSPEC 1.017  PHURINE 7.5  GLUCOSEU NEGATIVE  HGBUR NEGATIVE  BILIRUBINUR NEGATIVE  KETONESUR NEGATIVE  PROTEINUR NEGATIVE  UROBILINOGEN 0.2  NITRITE NEGATIVE  LEUKOCYTESUR NEGATIVE   Lipid Panel    Component Value Date/Time   CHOL 226* 05/05/2013 0630   HgbA1C  Lab Results  Component Value Date   HGBA1C 5.6 05/04/2013    Urine Drug Screen:   No results found for this basename: labopia,  cocainscrnur,   labbenz,  amphetmu,  thcu,  labbarb    Alcohol Level: No results found for this basename: ETH,  in the last 168 hours  Ct Head Wo Contrast 05/04/2013    Mild white matter changes. No CT evidence of an acute intracranial abnormality.  Recommend MRI if concern for acute ischemia persists.    Mri Brain Without Contrast 05/04/2013    No acute infarction. Mild chronic small-vessel change of the pons in the cerebral hemispheric white matter.  50% stenosis of the left M1 segment.   2 mm aneurysm projecting laterally from the proximal carotid siphon on the left. Small infundibulae versus aneurysms (2-3 mm) at both superior hypophyseal artery or posterior communicating regions.       Dg Chest Port 1 View 05/04/2013    Mild lung base opacities ; atelectasis/scarring versus infiltrate.     2D Echocardiogram  ejection fraction 65%. No cardiac source of emboli identified.  Carotid Doppler  Preliminary findings: Bilateral: 1-39% internal carotid artery stenosis. Bilateral: Vertebral artery flow is antegrade.   EKG  sinus rhythm rate 82 beats per minute  - For complete results please see formal report.   Therapy Recommendations pending  Physical Exam   Mental Status:  Alert, oriented, thought content appropriate. Speech fluent without evidence of aphasia. Able to follow 3 step commands without difficulty.  Cranial Nerves:  II: Discs flat bilaterally; Visual fields grossly normal, pupils equal, round, reactive to light and accommodation  III,IV, VI: ptosis not present, extra-ocular motions intact bilaterally  V,VII: smile symmetric, facial light touch sensation normal bilaterally  VIII: Hard of hearing bilaterally IX,X: gag reflex present  XI: bilateral shoulder shrug  XII: midline tongue extension without atrophy or fasciculations  Motor:  Right : Upper extremity 5/5 Left: Upper extremity 5/5  Lower extremity 5/5 Lower extremity 5/5  Tone and bulk:normal tone throughout; no atrophy noted   Sensory: Pinprick and light touch intact throughout, bilaterally  Deep Tendon Reflexes:  1+ all over  Plantars:  Right: downgoing Left: downgoing  Cerebellar:  normal finger-to-nose, normal heel-to-shin test  Gait:  Not tested.   ASSESSMENT [name] is a 80 y.o. female presenting with vertigo, blurred vision, and unsteadiness. TPA was not given as the patient's deficits had resolved. An MRI was negative for an acute infarct. It was felt that the patient had   a  Posterior circulationTIA. On aspirin 81 mg orally every day prior to admission. Now on clopidogrel 75 mg orally every day for secondary stroke prevention. Patient with resultant resolution of deficits. Stroke work up underway.   Anemia hemoglobin 8.7 hematocrit 29  Hyperlipidemia -  cholesterol 226 - LDL 145 - reported allergy to  statins.  Remote tobacco history / COPD  Hypertension  Hemoglobin A1c 5.6   Hospital day # 1  TREATMENT/PLAN  Continue clopidogrel 75 mg orally every day for secondary stroke prevention.  Discussed with patient possible participation in the SOCRATES stroke trial but she refused  Outpatient physical therapy recommended  We will sign off.  Followup Dr. [name] 2 months.   Mikey Bussing PA-C [facility] Pager [phone] 05/05/2013, 8:16 AM  I have personally obtained a history, examined the patient, evaluated imaging results, and formulated the assessment and plan of care. I agree with the above.    To contact Stroke Continuity provider, please refer to [email or web address] After hours, contact General Neurology

## 2013-05-22 ENCOUNTER — Ambulatory Visit: Payer: Medicare Other | Attending: Family Medicine | Admitting: Physical Therapy

## 2013-05-22 DIAGNOSIS — R269 Unspecified abnormalities of gait and mobility: Secondary | ICD-10-CM | POA: Insufficient documentation

## 2013-05-22 DIAGNOSIS — R42 Dizziness and giddiness: Secondary | ICD-10-CM | POA: Insufficient documentation

## 2013-05-22 DIAGNOSIS — IMO0001 Reserved for inherently not codable concepts without codable children: Secondary | ICD-10-CM | POA: Insufficient documentation

## 2013-08-17 ENCOUNTER — Ambulatory Visit (INDEPENDENT_AMBULATORY_CARE_PROVIDER_SITE_OTHER): Payer: Medicare Other | Admitting: Nurse Practitioner

## 2013-08-17 ENCOUNTER — Encounter (INDEPENDENT_AMBULATORY_CARE_PROVIDER_SITE_OTHER): Payer: Self-pay

## 2013-08-17 ENCOUNTER — Encounter: Payer: Self-pay | Admitting: Nurse Practitioner

## 2013-08-17 VITALS — BP 145/74 | HR 84 | Ht 66.5 in | Wt 173.5 lb

## 2013-08-17 DIAGNOSIS — R42 Dizziness and giddiness: Secondary | ICD-10-CM

## 2013-08-17 DIAGNOSIS — G459 Transient cerebral ischemic attack, unspecified: Secondary | ICD-10-CM | POA: Insufficient documentation

## 2013-08-17 MED ORDER — ASPIRIN EC 325 MG PO TBEC: 325.0000 mg | DELAYED_RELEASE_TABLET | Freq: Every day | ORAL | Status: DC

## 2013-08-17 NOTE — Progress Notes (Signed)
PATIENT: Becky Mccall DOB: 03-31-1929  REASON FOR VISIT: hospital follow up for TIA HISTORY FROM: patient  HISTORY OF PRESENT ILLNESS: Becky Mccall is an 80 y.o. female with a past medical history significant for HTN, hyperlipidemia, heart failure, COPD, arthritis, who on 05/04/2013 had an acute onset of transient vertigo, blurred vision, and unsteadiness. She comes to the office for her first post-discharge visit.  She said that she went to bed around 915 pm on 05/03/2013 feeling well, but woke up at approx 23:45 that night with blurred vision so she couldn't read the numbers in the clock, vertigo described as room spinning, and when she tried to walk to the bathroom she was very unstable and had to be helped by her husband. EMS was summoned and she vomited on her way to the ED.  Stated that the whole episode lasted about 30 minutes but then had an isolated episode of double vision while being examined in the ED. Denied HA, difficulty swallowing, focal weakness or numbness, slurred speech, confusion, or language impairment. CT brain showed no acute abnormality. MRI showed no acute infarction. Mild chronic small-vessel change of the pons in the cerebral hemispheric white matter. 50% stenosis of the left M1 segment. 2 mm aneurysm projecting laterally from the proximal carotid siphon on the left. Small infundibulae versus aneurysms (2-3 mm) at both superior hypophyseal artery or posterior communicating regions. 2D Echocardiogram with ejection fraction 65%. No cardiac source of emboli identified.  Carotid Doppler showed bilateral 1-39% internal carotid artery stenosis. Hyperlipidemia - cholesterol 226 - LDL 145 - reported allergy to statins. Hemoglobin A1c 5.6. Was on 2 baby aspirin daily. She has completed outpatient vestibular rehab and has had no recurrent symptoms.  REVIEW OF SYSTEMS: Full 14 system review of systems performed and notable only for: hearing loss, shortness of breath,  snoring, joint pain, back pain, aching muscles, walking difficulty, neck stiffness, dizziness   ALLERGIES: Allergies  Allergen Reactions  . Statins     Muscle pain    HOME MEDICATIONS: Outpatient Prescriptions Prior to Visit  Medication Sig Dispense Refill  . Calcium Carb-Cholecalciferol (CALCIUM 1000 + D PO) Take 1 tablet by mouth 2 (two) times daily.      . Cholecalciferol (VITAMIN D) 2000 UNITS tablet Take 2,000 Units by mouth 3 (three) times daily.      . Cinnamon 500 MG capsule Take 500 mg by mouth 3 (three) times daily.      Marland Kitchen levothyroxine (SYNTHROID, LEVOTHROID) 137 MCG tablet Take 137 mcg by mouth daily before breakfast.      . losartan-hydrochlorothiazide (HYZAAR) 100-25 MG per tablet Take 1 tablet by mouth daily.      . Magnesium 500 MG TABS Take 2 tablets by mouth daily.      . meclizine (ANTIVERT) 25 MG tablet Take 1 tablet (25 mg total) by mouth 3 (three) times daily as needed for dizziness.  30 tablet  0  . Multiple Vitamin (MULTIVITAMIN WITH MINERALS) TABS tablet Take 1 tablet by mouth daily.      Marland Kitchen omega-3 acid ethyl esters (LOVAZA) 1 G capsule Take 2 capsules (2 g total) by mouth 3 (three) times daily.  90 capsule  1  . tiotropium (SPIRIVA) 18 MCG inhalation capsule Place 18 mcg into inhaler and inhale daily.      . traMADol (ULTRAM) 50 MG tablet Take 1 tablet (50 mg total) by mouth every 8 (eight) hours as needed (pain not relieved by tylenol).  45 tablet  0  . vitamin B-12 (CYANOCOBALAMIN) 1000 MCG tablet Take 1,000 mcg by mouth daily.      . vitamin C (ASCORBIC ACID) 500 MG tablet Take 500 mg by mouth daily.      . vitamin E 400 UNIT capsule Take 400 Units by mouth 2 (two) times daily.      .  stroke: mapping our early stages of recovery book MISC 1 each by Does not apply route once.      . clopidogrel (PLAVIX) 75 MG tablet Take 1 tablet (75 mg total) by mouth daily with breakfast.  30 tablet  1  . pantoprazole (PROTONIX) 40 MG tablet Take 1 tablet (40 mg total) by  mouth daily.  30 tablet  1  . Pyridoxine HCl (VITAMIN B-6 PO) Take 1 tablet by mouth daily.        No facility-administered medications prior to visit.    PHYSICAL EXAM Filed Vitals:   08/17/13 1601  BP: 145/74  Pulse: 84  Height: 5' 6.5" (1.689 m)  Weight: 173 lb 8 oz (78.699 kg)   Body mass index is 27.59 kg/(m^2).  Visual Acuity Screening   Right eye Left eye Both eyes  Without correction: 20/30 20/30   With correction:       Generalized: Well developed, in no acute distress  Head: normocephalic and atraumatic. Oropharynx benign  Neck: Supple, no carotid bruits  Cardiac: Regular rate rhythm, 3/6 systolic murmur which radiates to clavicle Musculoskeletal: No deformity   Neurological examination  Mentation: Alert oriented to time, place, history taking. Follows all commands speech and language fluent Cranial nerve II-XII:  Pupils were equal round reactive to light extraocular movements were full, visual field were full on confrontational test. Facial sensation and strength were normal. hearing was intact to finger rubbing bilaterally. Uvula tongue midline. head turning and shoulder shrug and were normal and symmetric.Tongue protrusion into cheek strength was normal. Motor: The motor testing reveals 5 over 5 strength of all 4 extremities. Good symmetric motor tone is noted throughout.  Sensory: Sensory testing is intact to soft touch on all 4 extremities. No evidence of extinction is noted.  Coordination: Cerebellar testing reveals good finger-nose-finger and heel-to-shin bilaterally.  Gait and station: Gait is normal. Tandem gait is normal. Romberg is negative. Reflexes: Deep tendon reflexes are symmetric and normal bilaterally.    ASSESSMENT: Becky Mccall is a 80 y.o. female presenting with vertigo, blurred vision, and unsteadiness.  MRI was negative for an acute infarct. 2 small aneurysms were incidental findings.  Patient has a history of vestibular dysfunction.   It was felt that the patient had a severe episode of vertigo vs. posterior circulation TIA. She has had no recurrent symptoms.  She was previously taking Naproxen twice daily for arthritis pain and has felt much worse since not being able to take it due to taking Plavix. I think it is reasonable that she replace Plavix with a full-dose aspirin daily so that she may restart her Naproxen.  PLAN: I had a long discussion with the patient and family regarding her recent symptoms, discussed results of evaluation in the hospital and answered questions.  Start aspirin 325 mg orally every day after finished with Plavix for secondary stroke prevention and maintain strict control of hypertension with blood pressure goal below 130/90, diabetes with hemoglobin A1c goal below 6.5% and lipids with LDL cholesterol goal below 100 mg/dL. Followup in our office as needed.  Rudi Rummage LAM, MSN, NP-C 08/17/2013, 4:36 PM [facility] [address], [place], [place] [address] [phone]  Note: This document was prepared with digital dictation and possible smart phrase technology. Any transcriptional errors that result from this process are unintentional.

## 2013-08-17 NOTE — Patient Instructions (Addendum)
Start  aspirin 325 mg orally every day when your Plavix runs out for secondary stroke prevention and maintain strict control of hypertension with blood pressure goal below 130/90, diabetes with hemoglobin A1c goal below 6.5% and lipids with LDL cholesterol goal below 100 mg/dL.  Followup as needed, you may follow up with your family doctor.  It is not clear whether you had vertigo or a TIA.    Transient Ischemic Attack A transient ischemic attack (TIA) is a "warning stroke" that causes stroke-like symptoms. Unlike a stroke, a TIA does not cause permanent damage to the brain. The symptoms of a TIA can happen very fast and do not last long. It is important to know the symptoms of a TIA and what to do. This can help prevent a major stroke or death. CAUSES   A TIA is caused by a temporary blockage in an artery in the brain or neck (carotid artery). The blockage does not allow the brain to get the blood supply it needs and can cause different symptoms. The blockage can be caused by either:  A blood clot.  Fatty buildup (plaque) in a neck or brain artery. RISK FACTORS  High blood pressure (hypertension).  High cholesterol.  Diabetes mellitus.  Heart disease.  The build up of plaque in the blood vessels (peripheral artery disease or atherosclerosis).  The build up of plaque in the blood vessels providing blood and oxygen to the brain (carotid artery stenosis).  An abnormal heart rhythm (atrial fibrillation).  Obesity.  Smoking.  Taking oral contraceptives (especially in combination with smoking).  Physical inactivity.  A diet high in fats, salt (sodium), and calories.  Alcohol use.  Use of illegal drugs (especially cocaine and methamphetamine).  Being female.  Being African American.  Being over the age of 7.  Family history of stroke.  Previous history of blood clots, stroke, TIA, or heart attack.  Sickle cell disease. SYMPTOMS  TIA symptoms are the same as a stroke but  are temporary. These symptoms usually develop suddenly, or may be newly present upon awakening from sleep:  Sudden weakness or numbness of the face, arm, or leg, especially on one side of the body.  Sudden trouble walking or difficulty moving arms or legs.  Sudden confusion.  Sudden personality changes.  Trouble speaking (aphasia) or understanding.  Difficulty swallowing.  Sudden trouble seeing in one or both eyes.  Double vision.  Dizziness.  Loss of balance or coordination.  Sudden severe headache with no known cause.  Trouble reading or writing.  Loss of bowel or bladder control.  Loss of consciousness. DIAGNOSIS  Your caregiver may be able to determine the presence or absence of a TIA based on your symptoms, history, and physical exam. Computed tomography (CT scan) of the brain is usually performed to help identify a TIA. Other tests may be done to diagnose a TIA. These tests may include:  Electrocardiography.  Continuous heart monitoring.  Echocardiography.  Carotid ultrasonography.  Magnetic resonance imaging (MRI).  A scan of the brain circulation.  Blood tests. PREVENTION  The risk of a TIA can be decreased by appropriately treating high blood pressure, high cholesterol, diabetes, heart disease, and obesity and by quitting smoking, limiting alcohol, and staying physically active. TREATMENT  Time is of the essence. Since the symptoms of TIA are the same as a stroke, it is important to seek treatment as soon as possible because you may need a medicine to dissolve the clot (thrombolytic) that cannot be given if  too much time has passed. Treatment options vary. Treatment options may include rest, oxygen, intravenous (IV) fluids, and medicines to thin the blood (anticoagulants). Medicines and diet may be used to address diabetes, high blood pressure, and other risk factors. Measures will be taken to prevent short-term and long-term complications, including infection  from breathing foreign material into the lungs (aspiration pneumonia), blood clots in the legs, and falls. Treatment options include procedures to either remove plaque in the carotid arteries or dilate carotid arteries that have narrowed due to plaque. Those procedures are:  Carotid endarterectomy.  Carotid angioplasty and stenting. HOME CARE INSTRUCTIONS   Take all medicines prescribed by your caregiver. Follow the directions carefully. Medicines may be used to control risk factors for a stroke. Be sure you understand all your medicine instructions.  You may be told to take aspirin or the anticoagulant warfarin. Warfarin needs to be taken exactly as instructed.  Taking too much or too little warfarin is dangerous. Too much warfarin increases the risk of bleeding. Too little warfarin continues to allow the risk for blood clots. While taking warfarin, you will need to have regular blood tests to measure your blood clotting time. A PT blood test measures how long it takes for blood to clot. Your PT is used to calculate another value called an INR. Your PT and INR help your caregiver to adjust your dose of warfarin. The dose can change for many reasons. It is critically important that you take warfarin exactly as prescribed.  Many foods, especially foods high in vitamin K can interfere with warfarin and affect the PT and INR. Foods high in vitamin K include spinach, kale, broccoli, cabbage, collard and turnip greens, brussels sprouts, peas, cauliflower, seaweed, and parsley as well as beef and pork liver, green tea, and soybean oil. You should eat a consistent amount of foods high in vitamin K. Avoid major changes in your diet, or notify your caregiver before changing your diet. Arrange a visit with a dietitian to answer your questions.  Many medicines can interfere with warfarin and affect the PT and INR. You must tell your caregiver about any and all medicines you take, this includes all vitamins and  supplements. Be especially cautious with aspirin and anti-inflammatory medicines. Do not take or discontinue any prescribed or over-the-counter medicine except on the advice of your caregiver or pharmacist.  Warfarin can have side effects, such as excessive bruising or bleeding. You will need to hold pressure over cuts for longer than usual. Your caregiver or pharmacist will discuss other potential side effects.  Avoid sports or activities that may cause injury or bleeding.  Be mindful when shaving, flossing your teeth, or handling sharp objects.  Alcohol can change the body's ability to handle warfarin. It is best to avoid alcoholic drinks or consume only very small amounts while taking warfarin. Notify your caregiver if you change your alcohol intake.  Notify your dentist or other caregivers before procedures.  Eat a diet that includes 5 or more servings of fruits and vegetables each day. This may reduce the risk of stroke. Certain diets may be prescribed to address high blood pressure, high cholesterol, diabetes, or obesity.  A low-sodium, low-saturated fat, low-trans fat, low-cholesterol diet is recommended to manage high blood pressure.  A low-saturated fat, low-trans fat, low-cholesterol, and high-fiber diet may control cholesterol levels.  A controlled-carbohydrate, controlled-sugar diet is recommended to manage diabetes.  A reduced-calorie, low-sodium, low-saturated fat, low-trans fat, low-cholesterol diet is recommended to manage obesity.  Maintain a healthy weight.  Stay physically active. It is recommended that you get at least 30 minutes of activity on most or all days.  Do not smoke.  Limit alcohol use even if you are not taking warfarin. Moderate alcohol use is considered to be:  No more than 2 drinks each day for men.  No more than 1 drink each day for nonpregnant women.  Stop drug abuse.  Home safety. A safe home environment is important to reduce the risk of  falls. Your caregiver may arrange for specialists to evaluate your home. Having grab bars in the bedroom and bathroom is often important. Your caregiver may arrange for equipment to be used at home, such as raised toilets and a seat for the shower.  Follow all instructions for follow-up with your caregiver. This is very important. This includes any referrals and lab tests. Proper follow up can prevent a stroke or another TIA from occurring. SEEK MEDICAL CARE IF:  You have personality changes.  You have difficulty swallowing.  You are seeing double.  You have dizziness.  You have a fever.  You have skin breakdown. SEEK IMMEDIATE MEDICAL CARE IF:  Any of these symptoms may represent a serious problem that is an emergency. Do not wait to see if the symptoms will go away. Get medical help right away. Call your local emergency services (911 in [place] Do not drive yourself to the hospital.  You have sudden weakness or numbness of the face, arm, or leg, especially on one side of the body.  You have sudden trouble walking or difficulty moving arms or legs.  You have sudden confusion.  You have trouble speaking (aphasia) or understanding.  You have sudden trouble seeing in one or both eyes.  You have a loss of balance or coordination.  You have a sudden, severe headache with no known cause.  You have new chest pain or an irregular heartbeat.  You have a partial or total loss of consciousness. MAKE SURE YOU:   Understand these instructions.  Will watch your condition.  Will get help right away if you are not doing well or get worse. Document Released: 11/18/2004 Document Revised: 02/13/2013 Document Reviewed: 04/03/2009 [facility] Patient Information 2015 [place], [place]. This information is not intended to replace advice given to you by your health care provider. Make sure you discuss any questions you have with your health care provider.

## 2013-08-24 NOTE — Progress Notes (Signed)
I agree with above 

## 2013-09-14 ENCOUNTER — Ambulatory Visit (HOSPITAL_BASED_OUTPATIENT_CLINIC_OR_DEPARTMENT_OTHER): Payer: Medicare Other | Attending: Family Medicine

## 2013-09-14 VITALS — Ht 66.0 in | Wt 173.0 lb

## 2013-09-14 DIAGNOSIS — G4710 Hypersomnia, unspecified: Secondary | ICD-10-CM | POA: Diagnosis present

## 2013-09-14 DIAGNOSIS — G4730 Sleep apnea, unspecified: Secondary | ICD-10-CM | POA: Diagnosis present

## 2013-09-14 DIAGNOSIS — G4733 Obstructive sleep apnea (adult) (pediatric): Secondary | ICD-10-CM | POA: Diagnosis not present

## 2013-09-21 DIAGNOSIS — G4733 Obstructive sleep apnea (adult) (pediatric): Secondary | ICD-10-CM

## 2013-09-21 NOTE — Sleep Study (Signed)
   NAME: Becky Mccall DATE OF BIRTH:  1929/12/31 MEDICAL RECORD NUMBER [id number]  LOCATION: [facility]  PHYSICIAN: Glennie Rodda D  DATE OF STUDY: 09/14/2013  SLEEP STUDY TYPE: Nocturnal Polysomnogram               REFERRING PHYSICIAN: Vidal Schwalbe, MD  INDICATION FOR STUDY: Hypersomnia with sleep apnea  EPWORTH SLEEPINESS SCORE:   4/24 HEIGHT: 5\' 6"  (167.6 cm)  WEIGHT: 173 lb (78.472 kg)    Body mass index is 27.94 kg/(m^2).  NECK SIZE: 14 in.  MEDICATIONS: Charted for review  SLEEP ARCHITECTURE: Total sleep time 289.5 minutes sleep efficiency 80.8%. Stage I was 3.1%, stage II 91.9%, stage III absent, REM 5% of total sleep time. Sleep latency 25.5 minutes, REM latency 115.5 minutes, awake after sleep onset 26.5 minutes, arousal index 8.5, bedtime medication: Tylenol, sleep aid  RESPIRATORY DATA: Apnea hypopneas index (AHI) 14.5 per hour. 70 total events scored including 30 obstructive apneas, 1 central apnea, 39 hypopneas. Most events associated with supine sleep position. REM AHI 0. There were not enough early events to permit application of split protocol CPAP titration.  OXYGEN DATA: Moderately loud snoring with oxygen desaturation to a nadir of 85% and mean saturation 91.4% on room air.  CARDIAC DATA: Normal sinus rhythm with occasional PVC  MOVEMENT/PARASOMNIA: 138 leg jerks were counted of which only 2 were associated with arousals or awakenings for a periodic limb movement with arousal index of 0.4 per hour. Bathroom x1  IMPRESSION/ RECOMMENDATION:   1) Mild obstructive sleep apnea/hypopneas syndrome, AHI 14.5 per hour with mostly supine events. REM AHI 0. Moderately loud snoring with oxygen desaturation to a nadir of 85% and mean saturation 91.4% on room air. 2) there were not enough early events to permit application of split protocol CPAP titration. This patient can return for dedicated CPAP titration study if appropriate.   Signed  Baird Lyons M.D. Deneise Lever Diplomate, American Board of Sleep Medicine  ELECTRONICALLY SIGNED ON:  09/21/2013, 2:22 PM [place] PH: [phone]   FX: [phone] [place]

## 2013-09-26 ENCOUNTER — Encounter (HOSPITAL_BASED_OUTPATIENT_CLINIC_OR_DEPARTMENT_OTHER): Payer: Medicare Other

## 2013-10-08 ENCOUNTER — Ambulatory Visit (HOSPITAL_COMMUNITY): Payer: Medicare Other | Attending: Cardiology | Admitting: Cardiology

## 2013-10-08 ENCOUNTER — Other Ambulatory Visit (HOSPITAL_COMMUNITY): Payer: Self-pay | Admitting: Cardiology

## 2013-10-08 DIAGNOSIS — I350 Nonrheumatic aortic (valve) stenosis: Secondary | ICD-10-CM

## 2013-10-08 DIAGNOSIS — I359 Nonrheumatic aortic valve disorder, unspecified: Secondary | ICD-10-CM | POA: Diagnosis present

## 2013-10-08 NOTE — Progress Notes (Signed)
Echo performed. 

## 2014-06-17 ENCOUNTER — Ambulatory Visit
Admission: RE | Admit: 2014-06-17 | Discharge: 2014-06-17 | Disposition: A | Discharge status: Home / Self Care | Payer: Medicare Other | Source: Ambulatory Visit | Attending: Family Medicine | Admitting: Family Medicine

## 2014-06-17 ENCOUNTER — Other Ambulatory Visit: Payer: Self-pay | Admitting: Family Medicine

## 2014-06-17 DIAGNOSIS — M25552 Pain in left hip: Secondary | ICD-10-CM

## 2015-03-26 DIAGNOSIS — Z1231 Encounter for screening mammogram for malignant neoplasm of breast: Secondary | ICD-10-CM | POA: Diagnosis not present

## 2015-04-09 DIAGNOSIS — E039 Hypothyroidism, unspecified: Secondary | ICD-10-CM | POA: Diagnosis not present

## 2015-09-01 DIAGNOSIS — I493 Ventricular premature depolarization: Secondary | ICD-10-CM | POA: Diagnosis not present

## 2015-09-01 DIAGNOSIS — Z0000 Encounter for general adult medical examination without abnormal findings: Secondary | ICD-10-CM | POA: Diagnosis not present

## 2015-09-01 DIAGNOSIS — N183 Chronic kidney disease, stage 3 (moderate): Secondary | ICD-10-CM | POA: Diagnosis not present

## 2015-09-01 DIAGNOSIS — E559 Vitamin D deficiency, unspecified: Secondary | ICD-10-CM | POA: Diagnosis not present

## 2015-09-01 DIAGNOSIS — I129 Hypertensive chronic kidney disease with stage 1 through stage 4 chronic kidney disease, or unspecified chronic kidney disease: Secondary | ICD-10-CM | POA: Diagnosis not present

## 2015-09-01 DIAGNOSIS — M170 Bilateral primary osteoarthritis of knee: Secondary | ICD-10-CM | POA: Diagnosis not present

## 2015-09-01 DIAGNOSIS — E785 Hyperlipidemia, unspecified: Secondary | ICD-10-CM | POA: Diagnosis not present

## 2015-09-01 DIAGNOSIS — E039 Hypothyroidism, unspecified: Secondary | ICD-10-CM | POA: Diagnosis not present

## 2015-10-30 DIAGNOSIS — L820 Inflamed seborrheic keratosis: Secondary | ICD-10-CM | POA: Diagnosis not present

## 2015-10-30 DIAGNOSIS — L814 Other melanin hyperpigmentation: Secondary | ICD-10-CM | POA: Diagnosis not present

## 2015-10-30 DIAGNOSIS — Z85828 Personal history of other malignant neoplasm of skin: Secondary | ICD-10-CM | POA: Diagnosis not present

## 2015-10-30 DIAGNOSIS — L570 Actinic keratosis: Secondary | ICD-10-CM | POA: Diagnosis not present

## 2015-10-30 DIAGNOSIS — D235 Other benign neoplasm of skin of trunk: Secondary | ICD-10-CM | POA: Diagnosis not present

## 2015-10-30 DIAGNOSIS — L821 Other seborrheic keratosis: Secondary | ICD-10-CM | POA: Diagnosis not present

## 2015-10-30 DIAGNOSIS — D1801 Hemangioma of skin and subcutaneous tissue: Secondary | ICD-10-CM | POA: Diagnosis not present

## 2015-11-12 DIAGNOSIS — Z23 Encounter for immunization: Secondary | ICD-10-CM | POA: Diagnosis not present

## 2015-12-03 DIAGNOSIS — H02831 Dermatochalasis of right upper eyelid: Secondary | ICD-10-CM | POA: Diagnosis not present

## 2015-12-03 DIAGNOSIS — H04123 Dry eye syndrome of bilateral lacrimal glands: Secondary | ICD-10-CM | POA: Diagnosis not present

## 2016-06-02 DIAGNOSIS — Z1231 Encounter for screening mammogram for malignant neoplasm of breast: Secondary | ICD-10-CM | POA: Diagnosis not present

## 2016-08-18 DIAGNOSIS — H02831 Dermatochalasis of right upper eyelid: Secondary | ICD-10-CM | POA: Diagnosis not present

## 2016-08-18 DIAGNOSIS — H02834 Dermatochalasis of left upper eyelid: Secondary | ICD-10-CM | POA: Diagnosis not present

## 2016-08-30 DIAGNOSIS — I129 Hypertensive chronic kidney disease with stage 1 through stage 4 chronic kidney disease, or unspecified chronic kidney disease: Secondary | ICD-10-CM | POA: Diagnosis not present

## 2016-08-30 DIAGNOSIS — E039 Hypothyroidism, unspecified: Secondary | ICD-10-CM | POA: Diagnosis not present

## 2016-08-30 DIAGNOSIS — N183 Chronic kidney disease, stage 3 (moderate): Secondary | ICD-10-CM | POA: Diagnosis not present

## 2016-08-30 DIAGNOSIS — Q253 Supravalvular aortic stenosis: Secondary | ICD-10-CM | POA: Diagnosis not present

## 2016-10-28 ENCOUNTER — Ambulatory Visit (INDEPENDENT_AMBULATORY_CARE_PROVIDER_SITE_OTHER): Payer: Medicare Other | Admitting: Cardiology

## 2016-10-28 ENCOUNTER — Encounter: Payer: Self-pay | Admitting: Cardiology

## 2016-10-28 VITALS — BP 156/65 | HR 90 | Ht 66.0 in | Wt 167.1 lb

## 2016-10-28 DIAGNOSIS — I10 Essential (primary) hypertension: Secondary | ICD-10-CM

## 2016-10-28 DIAGNOSIS — C4492 Squamous cell carcinoma of skin, unspecified: Secondary | ICD-10-CM | POA: Insufficient documentation

## 2016-10-28 DIAGNOSIS — I350 Nonrheumatic aortic (valve) stenosis: Secondary | ICD-10-CM

## 2016-10-28 DIAGNOSIS — K635 Polyp of colon: Secondary | ICD-10-CM | POA: Insufficient documentation

## 2016-10-28 DIAGNOSIS — I6529 Occlusion and stenosis of unspecified carotid artery: Secondary | ICD-10-CM | POA: Insufficient documentation

## 2016-10-28 DIAGNOSIS — R0602 Shortness of breath: Secondary | ICD-10-CM | POA: Diagnosis not present

## 2016-10-28 DIAGNOSIS — E559 Vitamin D deficiency, unspecified: Secondary | ICD-10-CM | POA: Insufficient documentation

## 2016-10-28 DIAGNOSIS — K589 Irritable bowel syndrome without diarrhea: Secondary | ICD-10-CM | POA: Insufficient documentation

## 2016-10-28 DIAGNOSIS — E7800 Pure hypercholesterolemia, unspecified: Secondary | ICD-10-CM | POA: Diagnosis not present

## 2016-10-28 DIAGNOSIS — M8580 Other specified disorders of bone density and structure, unspecified site: Secondary | ICD-10-CM | POA: Insufficient documentation

## 2016-10-28 DIAGNOSIS — M5136 Other intervertebral disc degeneration, lumbar region: Secondary | ICD-10-CM | POA: Insufficient documentation

## 2016-10-28 DIAGNOSIS — M4302 Spondylolysis, cervical region: Secondary | ICD-10-CM | POA: Insufficient documentation

## 2016-10-28 DIAGNOSIS — G4733 Obstructive sleep apnea (adult) (pediatric): Secondary | ICD-10-CM | POA: Insufficient documentation

## 2016-10-28 DIAGNOSIS — N6019 Diffuse cystic mastopathy of unspecified breast: Secondary | ICD-10-CM | POA: Insufficient documentation

## 2016-10-28 NOTE — Progress Notes (Signed)
Cardiology Office Note    Date:  10/28/2016   ID:  Becky Mccall, DOB 12-02-1929, MRN [id number]  PCP:  Becky Stains, MD  Cardiologist:  Fransico Him, MD   Chief Complaint  Patient presents with  . Aortic Stenosis  . Hypertension  . Hyperlipidemia    History of Present Illness:  Becky Mccall is a 83 y.o. female who is being seen today for the evaluation of aortic stenosis at the request of Becky Stains, MD.  She has a history of COPD, mild to moderate AS, hyperlipidemia, HTN, hypothyroidism and GERD who was seen 4 years ago by me but was lost to followup.  She is now here to reestablish care.  She is doing well.  She denies any chest pain or pressure,  PND, orthopnea, LE edema, palpitations or syncope.  She has chronic vertigo so occasionally will feel dizzy.  She did have one episode a year ago when she got up too fast out of bed to use the bathroom and had near syncope.  This was about 2 years ago and still has some dizzy spells when getting up too fast but no presyncope.  She works in her garden and mows her yard and occasionally has some SOB with it and has to sit down and res.  She is planning to have knee replacement surgery sometime this fall and her PCP wanted her AV reevaluated. She brought in her BP readings today and says that sometimes her BP readings are normal and sometimes they are too high.  Sometimes her SBP will drop into the 90's.     Past Medical History:  Diagnosis Date  . Abrasion of left lower extremity 12/2012   draining wound on LLL;  . Aortic stenosis    mild AR/AS by 10/05/12 echo (Dr. Fransico Him)  . Arthritis    OA of hands and knees  . Carotid stenosis    1-39% bilateral  . Cervical spondylolysis   . Chest congestion    was on ZPAK and completed on 02/17/13  . Colon polyp   . COPD (chronic obstructive pulmonary disease) ([facility])    Spiriva inhaler daily  . DDD (degenerative disc disease), lumbar   . Fibrocystic breast disease   . GERD  (gastroesophageal reflux disease)    occasionally Rolaids or Tums  . Hard of hearing    wears hearing aides both ears  . Hyperlipidemia    can't take the meds so takes Fish Oil  . Hypertension    takes Losartan-HCTZ daily  . Hypothyroidism    takes Synthroid daily  . IBS (irritable bowel syndrome)   . Intermittent dysphagia    for pills and solids  . OSA (obstructive sleep apnea)   . Osteopenia   . Shortness of breath    with exertion  . Squamous cell skin cancer   . TIA (transient ischemic attack)   . Vitamin D deficiency     Past Surgical History:  Procedure Laterality Date  . ABDOMINAL HYSTERECTOMY    . BACK SURGERY  2009  . CATARACT EXTRACTION W/ INTRAOCULAR LENS  IMPLANT, BILATERAL  1999,2000  . EYE SURGERY    . HEMORROIDECTOMY    . LUMBAR LAMINECTOMY  1999    Current Medications: Current Meds  Medication Sig  . aspirin EC 325 MG tablet Take 1 tablet (325 mg total) by mouth daily.  . Calcium Carb-Cholecalciferol (CALCIUM 1000 + D PO) Take 1 tablet by mouth 2 (two) times daily.  Marland Kitchen  Cholecalciferol (VITAMIN D) 2000 UNITS tablet Take 6,000 Units by mouth daily.   . Cinnamon 500 MG capsule Take 500 mg by mouth 3 (three) times daily.  . diphenhydrAMINE (BENADRYL) 25 mg capsule Take 25 mg by mouth at bedtime as needed for sleep.  Marland Kitchen levothyroxine (SYNTHROID, LEVOTHROID) 137 MCG tablet Take 137 mcg by mouth daily before breakfast.  . losartan (COZAAR) 25 MG tablet Take 25 mg by mouth daily.  . Magnesium 500 MG TABS Take 2 tablets by mouth daily.  . meclizine (ANTIVERT) 25 MG tablet Take 12.5-25 mg by mouth every 6 (six) hours as needed for dizziness.  . Multiple Vitamin (MULTIVITAMIN WITH MINERALS) TABS tablet Take 1 tablet by mouth daily.  Marland Kitchen omega-3 acid ethyl esters (LOVAZA) 1 G capsule Take 2 capsules (2 g total) by mouth 3 (three) times daily.  . traMADol (ULTRAM) 50 MG tablet Take 1 tablet (50 mg total) by mouth every 8 (eight) hours as needed (pain not relieved by  tylenol).  . vitamin B-12 (CYANOCOBALAMIN) 1000 MCG tablet Take 1,000 mcg by mouth daily.  . vitamin C (ASCORBIC ACID) 500 MG tablet Take 500 mg by mouth daily.  . vitamin E 400 UNIT capsule Take 800 Units by mouth daily.     Allergies:   Lisinopril; Neosporin [neomycin-bacitracin zn-polymyx]; Relafen [nabumetone]; Statins; Welchol [colesevelam]; Zetia [ezetimibe]; and Zinc oxide   Social History   Social History  . Marital status: Married    Spouse name: irvin  . Number of children: 2  . Years of education: 10th   Occupational History  . retired    Social History Main Topics  . Smoking status: Former Smoker    Packs/day: 3.00    Years: 40.00    Types: Cigarettes  . Smokeless tobacco: Never Used     Comment: quit in June 11,1999  . Alcohol use 3.5 oz/week    7 Standard drinks or equivalent per week  . Drug use: No  . Sexual activity: No   Other Topics Concern  . None   Social History Narrative   Patient lives at home with her husband   Patient drinks coffee, sodas   Patient has 2 children    Patient is right handed    Patient is retired           Family History:  The patient's family history includes Cancer - Lung in her brother, maternal aunt, and mother; Heart attack in her father.   ROS:   Please see the history of present illness.    ROS include back and muscle pain, dizziness, balane problems, hearlin loss All other systems reviewed and are negative.  PAD Screen 10/28/2016  Previous PAD dx? No  Previous surgical procedure? No  Pain with walking? Yes  Subsides with rest? Yes  Feet/toe relief with dangling? Yes  Painful, non-healing ulcers? No  Extremities discolored? No       PHYSICAL EXAM:   VS:  BP (!) 156/65   Pulse 90   Ht 5\' 6"  (1.676 m)   Wt 167 lb 1.9 oz (75.8 kg)   SpO2 95%   BMI 26.97 kg/m    GEN: Well nourished, well developed, in no acute distress  HEENT: normal  Neck: no JVD, carotid bruits, or masses Cardiac: RRR; no rubs, or  gallops,no edema.  Intact distal pulses bilaterally. 2/6 SM at the RUSB with normal S1 with some decrease in S2 Respiratory:  clear to auscultation bilaterally, normal work of breathing GI: soft, nontender, nondistended, +  BS MS: no deformity or atrophy  Skin: warm and dry, no rash Neuro:  Alert and Oriented x 3, Strength and sensation are intact Psych: euthymic mood, full affect  Wt Readings from Last 3 Encounters:  10/28/16 167 lb 1.9 oz (75.8 kg)  09/14/13 173 lb (78.5 kg)  08/17/13 173 lb 8 oz (78.7 kg)      Studies/Labs Reviewed:   EKG:  EKG is ordered today.  The ekg ordered today demonstrates NSR at 75bpm with PVCs and septal infarct   Recent Labs: No results found for requested labs within last 8760 hours.   Lipid Panel    Component Value Date/Time   CHOL 226 (H) 05/05/2013 0630   TRIG 149 05/05/2013 0630   HDL 51 05/05/2013 0630   CHOLHDL 4.4 05/05/2013 0630   VLDL 30 05/05/2013 0630   LDLCALC 145 (H) 05/05/2013 0630    Additional studies/ records that were reviewed today include:  Office notes from PCP    ASSESSMENT:    1. Nonrheumatic aortic valve stenosis   2. Pure hypercholesterolemia   3. Essential hypertension   4. SOB (shortness of breath)      PLAN:  In order of problems listed above:  1. Nonrheumatic aortic stenosis - mild to moderate by echo 2014.  She still has a preserved S2 at the LLSB but decreased and murmur is late peaking.  I will get a 2D echo to assess further. She has had some DOE recently.    2. Hyperlipidemia - followed by PCP.  3. HTN - she has had some problems with wide swings in her BPs.  Some reading are high and then she will have some SPB readings in the 90's.  She complains some of dizziness when going from sitting to standing.  She will continue on Losartan.  I have asked her to check her BP daily in the am before she takes her meds and in the evening and call with the results.  I have also given her a Rx for compression  hose to wear during the day.    4. SOB - this occurs with exertion.  ? Whether this is related to worsening AS, undiagnosed CAD or COPD.  Will check 2D echo to rule significant AS as well as nuclear stress test as she wants to have a knee replacement later this fall.     Medication Adjustments/Labs and Tests Ordered: Current medicines are reviewed at length with the patient today.  Concerns regarding medicines are outlined above.  Medication changes, Labs and Tests ordered today are listed in the Patient Instructions below.  Patient Instructions  Medication Instructions:  Your provider recommends that you continue on your current medications as directed. Please refer to the Current Medication list given to you today.    Labwork: None  Testing/Procedures: Your provider has requested that you have an echocardiogram. Echocardiography is a painless test that uses sound waves to create images of your heart. It provides your doctor with information about the size and shape of your heart and how well your heart's chambers and valves are working. This procedure takes approximately one hour. There are no restrictions for this procedure.    Your provider has requested that you have a lexiscan myoview. For further information please visit [email or web address]. Please follow instruction sheet, as given.  Follow-Up: Your provider wants you to follow-up in: 1 year with Dr. [name]. You will receive a reminder letter in the mail two months in advance. If you don't receive a  letter, please call our office to schedule the follow-up appointment.    Any Other Special Instructions Will Be Listed Below (If Applicable).     If you need a refill on your cardiac medications before your next appointment, please call your pharmacy.      Signed, Fransico Him, MD  10/28/2016 10:27 AM    [facility] [place] [place], [place], [place]  [address] Phone: [phone]; Fax: [phone]

## 2016-10-28 NOTE — Addendum Note (Signed)
Addended by: Jacinta Shoe on: 10/28/2016 10:48 AM   Modules accepted: Orders

## 2016-10-28 NOTE — Patient Instructions (Addendum)
Medication Instructions:  Your provider recommends that you continue on your current medications as directed. Please refer to the Current Medication list given to you today.    Labwork: None  Testing/Procedures: Your provider has requested that you have an echocardiogram. Echocardiography is a painless test that uses sound waves to create images of your heart. It provides your doctor with information about the size and shape of your heart and how well your heart's chambers and valves are working. This procedure takes approximately one hour. There are no restrictions for this procedure.    Your provider has requested that you have a lexiscan myoview. For further information please visit [email or web address]. Please follow instruction sheet, as given.  Follow-Up: Your provider wants you to follow-up in: 1 year with Dr. [name]. You will receive a reminder letter in the mail two months in advance. If you don't receive a letter, please call our office to schedule the follow-up appointment.    Any Other Special Instructions Will Be Listed Below (If Applicable). Dr. [name] has given you a prescription for compression hose. Please wear them during the day and take them off at night.  Please check your blood pressure in the morning before you take your medications. Also check your blood pressure around dinner time. Keep a log for a couple weeks and call us with your readings.    If you need a refill on your cardiac medications before your next appointment, please call your pharmacy.

## 2016-11-01 ENCOUNTER — Ambulatory Visit (HOSPITAL_COMMUNITY): Payer: Medicare Other | Attending: Cardiovascular Disease

## 2016-11-01 DIAGNOSIS — I083 Combined rheumatic disorders of mitral, aortic and tricuspid valves: Secondary | ICD-10-CM | POA: Diagnosis not present

## 2016-11-01 DIAGNOSIS — I119 Hypertensive heart disease without heart failure: Secondary | ICD-10-CM | POA: Insufficient documentation

## 2016-11-01 DIAGNOSIS — J449 Chronic obstructive pulmonary disease, unspecified: Secondary | ICD-10-CM | POA: Diagnosis not present

## 2016-11-01 DIAGNOSIS — E785 Hyperlipidemia, unspecified: Secondary | ICD-10-CM | POA: Insufficient documentation

## 2016-11-01 DIAGNOSIS — I350 Nonrheumatic aortic (valve) stenosis: Secondary | ICD-10-CM | POA: Diagnosis not present

## 2016-11-02 ENCOUNTER — Telehealth (HOSPITAL_COMMUNITY): Payer: Self-pay | Admitting: *Deleted

## 2016-11-02 NOTE — Telephone Encounter (Signed)
Patient given detailed instructions per Myocardial Perfusion Study Information Sheet for the test on 11/08/16 at 1000. Patient notified to arrive 15 minutes early and that it is imperative to arrive on time for appointment to keep from having the test rescheduled.  If you need to cancel or reschedule your appointment, please call the office within 24 hours of your appointment. . Patient verbalized understanding.Eain Mullendore, Ranae Palms

## 2016-11-03 ENCOUNTER — Ambulatory Visit (INDEPENDENT_AMBULATORY_CARE_PROVIDER_SITE_OTHER): Payer: Medicare Other | Admitting: Orthopaedic Surgery

## 2016-11-03 ENCOUNTER — Ambulatory Visit (INDEPENDENT_AMBULATORY_CARE_PROVIDER_SITE_OTHER): Payer: Medicare Other

## 2016-11-03 DIAGNOSIS — M1712 Unilateral primary osteoarthritis, left knee: Secondary | ICD-10-CM | POA: Diagnosis not present

## 2016-11-03 DIAGNOSIS — M25562 Pain in left knee: Secondary | ICD-10-CM

## 2016-11-03 DIAGNOSIS — G8929 Other chronic pain: Secondary | ICD-10-CM | POA: Diagnosis not present

## 2016-11-03 MED ORDER — LIDOCAINE HCL 1 % IJ SOLN
3.0000 mL | INTRAMUSCULAR | Status: AC | PRN
  Administered 2016-11-03: 3 mL

## 2016-11-03 MED ORDER — METHYLPREDNISOLONE ACETATE 40 MG/ML IJ SUSP
40.0000 mg | INTRAMUSCULAR | Status: AC | PRN
  Administered 2016-11-03: 40 mg via INTRA_ARTICULAR

## 2016-11-03 NOTE — Progress Notes (Signed)
Office Visit Note   Patient: Becky Mccall           Date of Birth: Dec 06, 1929           MRN: [id number] Visit Date: 11/03/2016              Requested by: [name], MD [address] [place], [place] [id number] PCP: [name], MD   Assessment & Plan: Visit Diagnoses:  1. Chronic pain of left knee   2. Unilateral primary osteoarthritis, left knee     Plan: We talked about trying steroid injection in her knee and she is agreeable to this. She tolerated it well. We'll see her back in about 3 weeks to see how she is doing overall and can see if she is heading more toward knee replacement. All questions were encouraged and answered.  Follow-Up Instructions: Return in about 3 weeks (around 11/24/2016).   Orders:  Orders Placed This Encounter  Procedures  . Large Joint Injection/Arthrocentesis  . XR Knee 1-2 Views Left   No orders of the defined types were placed in this encounter.     Procedures: Large Joint Inj Date/Time: 11/03/2016 3:15 PM Performed by: Mcarthur Rossetti Authorized by: Mcarthur Rossetti   Location:  Knee Site:  L knee Ultrasound Guidance: No   Fluoroscopic Guidance: No   Arthrogram: No   Medications:  3 mL lidocaine 1 %; 40 mg methylPREDNISolone acetate 40 MG/ML     Clinical Data: No additional findings.   Subjective: No chief complaint on file. Patient someone have not seen in a while. She is a very pleasant 83 year old who be 89 in a few weeks. She has known arthritis of her left knee. She also consider knee replaced surgery at this point. She said it started to slow her down in terms of detrimentally affecting her activities daily living, her quality of life, and her mobility. We have tried multiple injections in the past but is been a while. She said the used to help the last visit and help as much. Actually scheduled to undergo a stress test I believe next week.  HPI  Review of Systems She currently  denies any headache, chest pain, shortness of breath, fever, chills, nausea, vomiting.  Objective: Vital Signs: There were no vitals taken for this visit.  Physical Exam She is alert or 3 and in no acute distress she does have hearing difficulties. Ortho Exam Examination of her left knee shows some lateral joint line tenderness but normal alignment overall. She does have patellofemoral crepitation. The knee feels ligamentously stable. Specialty Comments:  No specialty comments available.  Imaging: Xr Knee 1-2 Views Left  Result Date: 11/03/2016 An AP and lateral of the left knee shows moderate arthritic changes. The alignment overall is neutral but she has significant periarticular osteophytes throughout the knee.    PMFS History: Patient Active Problem List   Diagnosis Date Noted  . SOB (shortness of breath) 10/28/2016  . Fibrocystic breast disease   . IBS (irritable bowel syndrome)   . Cervical spondylolysis   . Osteopenia   . Vitamin D deficiency   . DDD (degenerative disc disease), lumbar   . Squamous cell skin cancer   . Carotid stenosis   . OSA (obstructive sleep apnea)   . Colon polyp   . TIA (transient ischemic attack) 08/17/2013  . Vertigo 05/04/2013  . Hard of hearing   . Hyperlipidemia   . Heart murmur   . Aortic stenosis   .  COPD (chronic obstructive pulmonary disease) ([place])   . Hypothyroidism   . GERD (gastroesophageal reflux disease)   . Spinal stenosis in cervical region 02/19/2013   Past Medical History:  Diagnosis Date  . Abrasion of left lower extremity 12/2012   draining wound on LLL;  . Aortic stenosis    mild AR/AS by 10/05/12 echo (Dr. Fransico Him)  . Arthritis    OA of hands and knees  . Carotid stenosis    1-39% bilateral  . Cervical spondylolysis   . Chest congestion    was on ZPAK and completed on 02/17/13  . Colon polyp   . COPD (chronic obstructive pulmonary disease) (HCC)    Spiriva inhaler daily  . DDD (degenerative disc  disease), lumbar   . Fibrocystic breast disease   . GERD (gastroesophageal reflux disease)    occasionally Rolaids or Tums  . Hard of hearing    wears hearing aides both ears  . Hyperlipidemia    can't take the meds so takes Fish Oil  . Hypertension    takes Losartan-HCTZ daily  . Hypothyroidism    takes Synthroid daily  . IBS (irritable bowel syndrome)   . Intermittent dysphagia    for pills and solids  . OSA (obstructive sleep apnea)   . Osteopenia   . Shortness of breath    with exertion  . Squamous cell skin cancer   . TIA (transient ischemic attack)   . Vitamin D deficiency     Family History  Problem Relation Age of Onset  . Cancer - Lung Mother   . Heart attack Father   . Cancer - Lung Brother   . Cancer - Lung Maternal Aunt     Past Surgical History:  Procedure Laterality Date  . ABDOMINAL HYSTERECTOMY    . BACK SURGERY  2009  . CATARACT EXTRACTION W/ INTRAOCULAR LENS  IMPLANT, BILATERAL  1999,2000  . EYE SURGERY    . HEMORROIDECTOMY    . LUMBAR LAMINECTOMY  1999   Social History   Occupational History  . retired    Social History Main Topics  . Smoking status: Former Smoker    Packs/day: 3.00    Years: 40.00    Types: Cigarettes  . Smokeless tobacco: Never Used     Comment: quit in June 11,1999  . Alcohol use 3.5 oz/week    7 Standard drinks or equivalent per week  . Drug use: No  . Sexual activity: No

## 2016-11-04 ENCOUNTER — Encounter (HOSPITAL_COMMUNITY): Payer: Medicare Other

## 2016-11-08 ENCOUNTER — Encounter (HOSPITAL_COMMUNITY): Payer: Medicare Other

## 2016-11-10 ENCOUNTER — Encounter: Payer: Self-pay | Admitting: Physician Assistant

## 2016-11-10 ENCOUNTER — Ambulatory Visit (INDEPENDENT_AMBULATORY_CARE_PROVIDER_SITE_OTHER): Payer: Medicare Other | Admitting: Physician Assistant

## 2016-11-10 VITALS — BP 140/68 | HR 64 | Ht 66.0 in | Wt 165.1 lb

## 2016-11-10 DIAGNOSIS — I350 Nonrheumatic aortic (valve) stenosis: Secondary | ICD-10-CM | POA: Diagnosis not present

## 2016-11-10 DIAGNOSIS — R0989 Other specified symptoms and signs involving the circulatory and respiratory systems: Secondary | ICD-10-CM

## 2016-11-10 DIAGNOSIS — I5032 Chronic diastolic (congestive) heart failure: Secondary | ICD-10-CM | POA: Diagnosis not present

## 2016-11-10 DIAGNOSIS — D649 Anemia, unspecified: Secondary | ICD-10-CM

## 2016-11-10 MED ORDER — ASPIRIN EC 81 MG PO TBEC: 81.0000 mg | DELAYED_RELEASE_TABLET | Freq: Every day | ORAL | 3 refills | Status: DC

## 2016-11-10 NOTE — Progress Notes (Signed)
Cardiology Office Note    Date:  11/10/2016  ID:  Becky Mccall, DOB August 10, 1929, MRN [id number] PCP:  [name], MD  Cardiologist:  Dr. [name]   Chief Complaint: discuss cath  History of Present Illness:  Becky Mccall is a 83 y.o. female with history of COPD, AS, carotid artery disease (1-39% 04/2013), hard of hearing, hyperlipidemia, TIA, HTN, hypothyroidism, GERD, ?CKD (updated stage unknown), significant anemia 04/2013 who recently presented back to Dr. [name] to re-establish care. She was previously seen 4 years ago but lost to follow-up. She was recently referred back to Dr. [name] for evaluation of pre-syncope and DOE. She was also planning to have knee replacement surgery sometime this fall and her PCP wanted her AV reevaluated. She reported labile BP, sometimes normal, high, or dropping into the 90s. She was asked to use compression hose. 2D echo 11/01/16 showed moderate LVH, normal LV function, grade 1 diastolic dysfunction, high ventricular filling pressure,  severe aortic stenosis, mild mitral regurgitation, severely dilated left atrium, mild TR. Dr. [name] recommended she return today for follow-up visit to arrange and discuss right and left heart cath.    She returns for follow-up with her daughter Becky Mccall. She does report over the last year she's been experiencing episodic dyspnea on exertion, requiring her to stop and rest. She has also noticed an associated sensation of chest tightness with that. These improve with rest. The chest tightness is less frequent than the dyspnea. No marked change recently, but gradual onset over a period of time. She lives alone and cooks, cleans, and mows her own lawn. She does not use any assistive aid. Her mobility has also been limited by need for possible knee replacement (which prompted above workup in the first place).  No labs on file since 04/2013 when A1C was normal, LDL 145 ("unable to take meds besides fish oil"), BUN 27, Cr 1.30, Hgb  8.4 (previously 13.4 in 2014) - this was in the setting of a workup for vertigo with documentation of no overt bleeding during admission, OP workup recommended. She does not recall being told she's had any issues with this. She has not had any full syncope since that time but does have dizzy spells when standing too quickly.   Past Medical History:  Diagnosis Date  . Abrasion of left lower extremity 12/2012   draining wound on LLL;  . Anemia    a. Hgb 8.4 (2015), previously 13.4 in 2014.  . Arthritis    OA of hands and knees  . Carotid stenosis    1-39% bilateral in 2015  . Cervical spondylolysis   . Chronic diastolic CHF (congestive heart failure) ([place])   . Colon polyp   . COPD (chronic obstructive pulmonary disease) ([facility])    Spiriva inhaler daily  . DDD (degenerative disc disease), lumbar   . Fibrocystic breast disease   . GERD (gastroesophageal reflux disease)   . Hard of hearing    wears hearing aides both ears  . Hyperlipidemia    can't take the meds so takes Fish Oil  . Hypertension   . Hypothyroidism   . IBS (irritable bowel syndrome)   . Intermittent dysphagia    for pills and solids  . OSA (obstructive sleep apnea)   . Osteopenia   . Severe aortic stenosis   . Squamous cell skin cancer   . TIA (transient ischemic attack)   . Vitamin D deficiency     Past Surgical History:  Procedure Laterality Date  .  ABDOMINAL HYSTERECTOMY    . BACK SURGERY  2009  . CATARACT EXTRACTION W/ INTRAOCULAR LENS  IMPLANT, BILATERAL  1999,2000  . EYE SURGERY    . HEMORROIDECTOMY    . LUMBAR LAMINECTOMY  1999    Current Medications: Current Meds  Medication Sig  . aspirin EC 325 MG tablet Take 1 tablet (325 mg total) by mouth daily.  . Calcium Carb-Cholecalciferol (CALCIUM 1000 + D PO) Take 1 tablet by mouth 2 (two) times daily.  . Cholecalciferol (VITAMIN D) 2000 UNITS tablet Take 6,000 Units by mouth daily.   . Cinnamon 500 MG capsule Take 500 mg by mouth 3 (three) times  daily.  . diphenhydrAMINE (BENADRYL) 25 mg capsule Take 25 mg by mouth at bedtime as needed for sleep.  Marland Kitchen levothyroxine (SYNTHROID, LEVOTHROID) 137 MCG tablet Take 137 mcg by mouth daily before breakfast.  . losartan (COZAAR) 25 MG tablet Take 25 mg by mouth daily.  . Magnesium 500 MG TABS Take 2 tablets by mouth daily.  . meclizine (ANTIVERT) 25 MG tablet Take 12.5-25 mg by mouth every 6 (six) hours as needed for dizziness.  . Multiple Vitamin (MULTIVITAMIN WITH MINERALS) TABS tablet Take 1 tablet by mouth daily.  Marland Kitchen omega-3 acid ethyl esters (LOVAZA) 1 G capsule Take 2 capsules (2 g total) by mouth 3 (three) times daily.  . traMADol (ULTRAM) 50 MG tablet Take 1 tablet (50 mg total) by mouth every 8 (eight) hours as needed (pain not relieved by tylenol).  . Turmeric 500 MG CAPS Take 500 mg by mouth 2 (two) times daily.  . vitamin B-12 (CYANOCOBALAMIN) 1000 MCG tablet Take 1,000 mcg by mouth daily.  . vitamin C (ASCORBIC ACID) 500 MG tablet Take 500 mg by mouth daily.  . vitamin E 400 UNIT capsule Take 800 Units by mouth daily.      Allergies:   Lisinopril; Neosporin [neomycin-bacitracin zn-polymyx]; Relafen [nabumetone]; Statins; Welchol [colesevelam]; Zetia [ezetimibe]; and Zinc oxide   Social History   Social History  . Marital status: Married    Spouse name: irvin  . Number of children: 2  . Years of education: 10th   Occupational History  . retired    Social History Main Topics  . Smoking status: Former Smoker    Packs/day: 3.00    Years: 40.00    Types: Cigarettes  . Smokeless tobacco: Never Used     Comment: quit in June 11,1999  . Alcohol use 3.5 oz/week    7 Standard drinks or equivalent per week  . Drug use: No  . Sexual activity: No   Other Topics Concern  . None   Social History Narrative   Patient lives at home with her husband   Patient drinks coffee, sodas   Patient has 2 children    Patient is right handed    Patient is retired           Family  History:  Family History  Problem Relation Age of Onset  . Cancer - Lung Mother   . Heart attack Father   . Cancer - Lung Brother   . Cancer - Lung Maternal Aunt     ROS:   Please see the history of present illness.  All other systems are reviewed and otherwise negative.    PHYSICAL EXAM:   VS:  BP 140/68   Pulse 64   Ht 5\' 6"  (1.676 m)   Wt 165 lb 1.9 oz (74.9 kg)   LMP  (LMP Unknown)  BMI 26.65 kg/m   BMI: Body mass index is 26.65 kg/m. GEN: Well nourished, well developed WF, pleasant, well groomed, in no acute distress  HEENT: normocephalic, atraumatic Neck: no JVD, carotid bruits, or masses Cardiac: RRR; 2/6 SEM with diminished S2. No rubs or gallops, no edema  Respiratory:  clear to auscultation bilaterally, normal work of breathing GI: soft, nontender, nondistended, + BS MS: no deformity or atrophy  Skin: warm and dry, no rash Neuro:  Alert and Oriented x 3, Strength and sensation are intact, follows commands, hard of hearing (L ear with hearing aid) Psych: euthymic mood, full affect  Wt Readings from Last 3 Encounters:  11/10/16 165 lb 1.9 oz (74.9 kg)  10/28/16 167 lb 1.9 oz (75.8 kg)  09/14/13 173 lb (78.5 kg)      Studies/Labs Reviewed:   EKG: EKG was not ordered today.  Recent Labs: No results found for requested labs within last 8760 hours.   Lipid Panel    Component Value Date/Time   CHOL 226 (H) 05/05/2013 0630   TRIG 149 05/05/2013 0630   HDL 51 05/05/2013 0630   CHOLHDL 4.4 05/05/2013 0630   VLDL 30 05/05/2013 0630   LDLCALC 145 (H) 05/05/2013 0630    Additional studies/ records that were reviewed today include: Summarized above    ASSESSMENT & PLAN:   1. Severe aortic stenosis - per recommendation from Dr. [name], will proceed with right and left heart cath. Risks and benefits of cardiac catheterization have been discussed with the patient.  These include bleeding, infection, kidney damage, stroke, heart attack, death.  The patient  understands these risks and is willing to proceed. We do not have any recent data regarding her renal function (I suspect prior CKD) and prior anemia. Will check pre-cath screening labs including BMET, CBC, PT/INR. Will plan to write pre-cath orders when labs become available. Addendum: After the patient's visit, I brought her case to Dr. Burt Knack to get his input whether she would be a candidate for SAVR vs TAVR. Her advanced age, decreased mobility related to knee issues and other comorbidities make her higher risk for conventional aortic valve replacement. Per STS Risk Calculator, her predicted risk of mortality with conventional SAVR is 4.944% thus she may actually be a more favorable candidate for TAVR. Per our discussion, will send a message to Theodosia Quay, structural heart coordinator, to arrange a visit with Dr. Maryelizabeth Rowan after her cath to further discuss.  2. Suspected chronic diastolic CHF - appears compensated today; difficult to know if there is contribution of this to her dyspnea given her concomitant severe aortic stenosis. Will also check BNP. 3. Labile HTN - currently controlled. Would not press regimen much further given h/o dizziness and aortic stenosis. Add thyroid to labs. 4. Anemia - prior h/o this in 2015, unclear what workup she has since had or what recent hemoglobin has run. Will repeat CBC with pre-cath labs. Denies any bleeding.  Disposition: F/u with Dr. Betsy Pries team APP after above studies.   Medication Adjustments/Labs and Tests Ordered: Current medicines are reviewed at length with the patient today.  Concerns regarding medicines are outlined above. Medication changes, Labs and Tests ordered today are summarized above and listed in the Patient Instructions accessible in Encounters.   Signed, Charlie Pitter, PA-C  11/10/2016 2:59 PM    [facility] [address], [place], [place]  [address] Phone: [phone]; Fax: [phone]

## 2016-11-10 NOTE — Patient Instructions (Addendum)
Medication Instructions:  Your physician has recommended you make the following change in your medication:  1.  DECREASE the Aspirin to 81 mg daily   Labwork: TODAY:  BMET, CBC, PT/INR, TSH & PRO BNP  Testing/Procedures: Your physician has requested that you have a cardiac catheterization. Cardiac catheterization is used to diagnose and/or treat various heart conditions. Doctors may recommend this procedure for a number of different reasons. The most common reason is to evaluate chest pain. Chest pain can be a symptom of coronary artery disease (CAD), and cardiac catheterization can show whether plaque is narrowing or blocking your heart's arteries. This procedure is also used to evaluate the valves, as well as measure the blood flow and oxygen levels in different parts of your heart. For further information please visit [email or web address]. Please follow instruction sheet, as given.    Follow-Up: Your physician recommends that you schedule a follow-up appointment in: 2 WEEKS AFTER 11/15/16 WITH DAYNA DUNN, PA-C   Any Other Special Instructions Will Be Listed Below (If Applicable).   [place] [facility] [address] [place] [address] [place] [address] Dept: [phone] Loc: [phone]  BABETTA PATERSON  11/10/2016  You are scheduled for a Cardiac Catheterization on Monday, September 24 with Dr. Glenetta Hew.  1. Please arrive at the [facility] (Main Entrance A) at [facility]: [place],  [address] at 10:00 AM (two hours before your procedure to ensure your preparation). Free valet parking service is available.   Special note: Every effort is made to have your procedure done on time. Please understand that emergencies sometimes delay scheduled procedures.  2. Diet: Do not eat or drink anything after midnight prior to your procedure except sips of water to take  medications.  3. Labs: TODAY  4. Medication instructions in preparation for your procedure: HOLD THE LOSARTAN ON THE DAY OF YOUR CATH  On the morning of your procedure, take your Aspirin and any morning medicines NOT listed above.  You may use sips of water.  5. Plan for one night stay--bring personal belongings. 6. Bring a current list of your medications and current insurance cards. 7. You MUST have a responsible person to drive you home. 8. Someone MUST be with you the first 24 hours after you arrive home or your discharge will be delayed. 9. Please wear clothes that are easy to get on and off and wear slip-on shoes.  Thank you for allowing [place] to care for you!   -- [facility]     If you need a refill on your cardiac medications before your next appointment, please call your pharmacy.

## 2016-11-11 ENCOUNTER — Telehealth: Payer: Self-pay | Admitting: Physician Assistant

## 2016-11-11 ENCOUNTER — Other Ambulatory Visit: Payer: Self-pay | Admitting: Physician Assistant

## 2016-11-11 LAB — PROTIME-INR
INR: 1 (ref 0.8–1.2)
Prothrombin Time: 10.5 s (ref 9.1–12.0)

## 2016-11-11 LAB — CBC
Hematocrit: 39.1 % (ref 34.0–46.6)
Hemoglobin: 13.1 g/dL (ref 11.1–15.9)
MCH: 29.2 pg (ref 26.6–33.0)
MCHC: 33.5 g/dL (ref 31.5–35.7)
MCV: 87 fL (ref 79–97)
PLATELETS: 210 10*3/uL (ref 150–379)
RBC: 4.48 x10E6/uL (ref 3.77–5.28)
RDW: 16.3 % — ABNORMAL HIGH (ref 12.3–15.4)
WBC: 5.8 10*3/uL (ref 3.4–10.8)

## 2016-11-11 LAB — BASIC METABOLIC PANEL
BUN/Creatinine Ratio: 14 (ref 12–28)
BUN: 18 mg/dL (ref 8–27)
CALCIUM: 9.8 mg/dL (ref 8.7–10.3)
CHLORIDE: 102 mmol/L (ref 96–106)
CO2: 24 mmol/L (ref 20–29)
CREATININE: 1.29 mg/dL — AB (ref 0.57–1.00)
GFR, EST AFRICAN AMERICAN: 44 mL/min/{1.73_m2} — AB (ref >59)
GFR, EST NON AFRICAN AMERICAN: 38 mL/min/{1.73_m2} — AB (ref >59)
Glucose: 83 mg/dL (ref 65–99)
POTASSIUM: 4.5 mmol/L (ref 3.5–5.2)
SODIUM: 140 mmol/L (ref 134–144)

## 2016-11-11 LAB — PRO B NATRIURETIC PEPTIDE: NT-PRO BNP: 379 pg/mL (ref 0–738)

## 2016-11-11 LAB — TSH: TSH: 2.87 u[IU]/mL (ref 0.450–4.500)

## 2016-11-11 NOTE — Telephone Encounter (Signed)
F/u message ° °Pt returning RN call. Please call back to discuss  °

## 2016-11-11 NOTE — Telephone Encounter (Signed)
-----   Message from Charlie Pitter, [place] sent at 11/11/2016  1:42 PM EDT ----- Please call patient. Labs are overall stable except mild kidney insufficiency has progressed slightly (not a huge change but slight increase compared to 2015). Can you see if we can find out prior value from primary care - just need last Cr. Let's have her hold Losartan on Sunday 11/14/16 in addition to day of procedure. Please inform cath lab that she has renal insufficiency and they should consider not performing an LV gram with her cath. Dayna Dunn PA-C

## 2016-11-11 NOTE — Telephone Encounter (Signed)
Returned pts call and she has been made aware of her lab results and also to also hold her Losartan on Sunday, 11/14/16. Pt and daughter, [name], both verbalized understanding.

## 2016-11-12 ENCOUNTER — Telehealth: Payer: Self-pay

## 2016-11-12 NOTE — Telephone Encounter (Signed)
Patient contacted pre-catheterization at [facility] scheduled for:  11/15/2016 @ 1200 Verified arrival time and place:  NT @ 1000 Confirmed AM meds to be taken pre-cath with sip of water: Take ASA Hold losartan Sunday and Monday  Confirmed patient has responsible person to drive home post procedure and observe patient for 24 hours:  yes Addl concerns:  Pt with Cr 1.29- Holding losartan.  Encouraged Pt to drink extra water as tolerated.  Notified cath lab of APP request "Please inform cath lab that she has renal insufficiency and they should consider not performing an LV gram with her cath. Dayna Dunn PA-C"

## 2016-11-15 ENCOUNTER — Ambulatory Visit (HOSPITAL_COMMUNITY)
Admission: RE | Admit: 2016-11-15 | Discharge: 2016-11-15 | Disposition: A | Discharge status: Home / Self Care | Payer: Medicare Other | Source: Ambulatory Visit | Attending: Cardiology | Admitting: Cardiology

## 2016-11-15 ENCOUNTER — Encounter (HOSPITAL_COMMUNITY)
Admission: RE | Disposition: A | Discharge status: Home / Self Care | Payer: Self-pay | Source: Ambulatory Visit | Attending: Cardiology

## 2016-11-15 DIAGNOSIS — M170 Bilateral primary osteoarthritis of knee: Secondary | ICD-10-CM | POA: Insufficient documentation

## 2016-11-15 DIAGNOSIS — Z8673 Personal history of transient ischemic attack (TIA), and cerebral infarction without residual deficits: Secondary | ICD-10-CM | POA: Insufficient documentation

## 2016-11-15 DIAGNOSIS — E559 Vitamin D deficiency, unspecified: Secondary | ICD-10-CM | POA: Insufficient documentation

## 2016-11-15 DIAGNOSIS — K219 Gastro-esophageal reflux disease without esophagitis: Secondary | ICD-10-CM | POA: Diagnosis not present

## 2016-11-15 DIAGNOSIS — I129 Hypertensive chronic kidney disease with stage 1 through stage 4 chronic kidney disease, or unspecified chronic kidney disease: Secondary | ICD-10-CM | POA: Diagnosis not present

## 2016-11-15 DIAGNOSIS — J449 Chronic obstructive pulmonary disease, unspecified: Secondary | ICD-10-CM | POA: Insufficient documentation

## 2016-11-15 DIAGNOSIS — E785 Hyperlipidemia, unspecified: Secondary | ICD-10-CM | POA: Diagnosis not present

## 2016-11-15 DIAGNOSIS — N189 Chronic kidney disease, unspecified: Secondary | ICD-10-CM | POA: Diagnosis not present

## 2016-11-15 DIAGNOSIS — R0602 Shortness of breath: Secondary | ICD-10-CM | POA: Diagnosis not present

## 2016-11-15 DIAGNOSIS — Z7982 Long term (current) use of aspirin: Secondary | ICD-10-CM | POA: Diagnosis not present

## 2016-11-15 DIAGNOSIS — D631 Anemia in chronic kidney disease: Secondary | ICD-10-CM | POA: Insufficient documentation

## 2016-11-15 DIAGNOSIS — I350 Nonrheumatic aortic (valve) stenosis: Secondary | ICD-10-CM | POA: Diagnosis not present

## 2016-11-15 DIAGNOSIS — M8580 Other specified disorders of bone density and structure, unspecified site: Secondary | ICD-10-CM | POA: Insufficient documentation

## 2016-11-15 DIAGNOSIS — Z87891 Personal history of nicotine dependence: Secondary | ICD-10-CM | POA: Diagnosis not present

## 2016-11-15 DIAGNOSIS — E039 Hypothyroidism, unspecified: Secondary | ICD-10-CM | POA: Insufficient documentation

## 2016-11-15 DIAGNOSIS — M19042 Primary osteoarthritis, left hand: Secondary | ICD-10-CM | POA: Diagnosis not present

## 2016-11-15 DIAGNOSIS — I6523 Occlusion and stenosis of bilateral carotid arteries: Secondary | ICD-10-CM | POA: Diagnosis not present

## 2016-11-15 DIAGNOSIS — I2510 Atherosclerotic heart disease of native coronary artery without angina pectoris: Secondary | ICD-10-CM | POA: Diagnosis not present

## 2016-11-15 DIAGNOSIS — M5136 Other intervertebral disc degeneration, lumbar region: Secondary | ICD-10-CM | POA: Diagnosis not present

## 2016-11-15 DIAGNOSIS — M19041 Primary osteoarthritis, right hand: Secondary | ICD-10-CM | POA: Diagnosis not present

## 2016-11-15 DIAGNOSIS — G4733 Obstructive sleep apnea (adult) (pediatric): Secondary | ICD-10-CM | POA: Diagnosis not present

## 2016-11-15 DIAGNOSIS — K589 Irritable bowel syndrome without diarrhea: Secondary | ICD-10-CM | POA: Diagnosis not present

## 2016-11-15 HISTORY — PX: RIGHT/LEFT HEART CATH AND CORONARY ANGIOGRAPHY: CATH118266

## 2016-11-15 LAB — POCT I-STAT 3, ART BLOOD GAS (G3+)
Acid-Base Excess: 4 mmol/L — ABNORMAL HIGH (ref 0.0–2.0)
BICARBONATE: 27.6 mmol/L (ref 20.0–28.0)
O2 SAT: 98 %
PCO2 ART: 38.8 mmHg (ref 32.0–48.0)
PO2 ART: 106 mmHg (ref 83.0–108.0)
TCO2: 29 mmol/L (ref 22–32)
pH, Arterial: 7.461 — ABNORMAL HIGH (ref 7.350–7.450)

## 2016-11-15 LAB — POCT I-STAT 3, VENOUS BLOOD GAS (G3P V)
ACID-BASE EXCESS: 3 mmol/L — AB (ref 0.0–2.0)
BICARBONATE: 27.8 mmol/L (ref 20.0–28.0)
O2 Saturation: 80 %
PO2 VEN: 44 mmHg (ref 32.0–45.0)
TCO2: 29 mmol/L (ref 22–32)
pCO2, Ven: 42.4 mmHg — ABNORMAL LOW (ref 44.0–60.0)
pH, Ven: 7.424 (ref 7.250–7.430)

## 2016-11-15 LAB — POCT ACTIVATED CLOTTING TIME: Activated Clotting Time: 186 seconds

## 2016-11-15 SURGERY — RIGHT/LEFT HEART CATH AND CORONARY ANGIOGRAPHY
Anesthesia: LOCAL

## 2016-11-15 MED ORDER — ACETAMINOPHEN 325 MG PO TABS: 650.0000 mg | ORAL_TABLET | ORAL | Status: DC | PRN

## 2016-11-15 MED ORDER — SODIUM CHLORIDE 0.9 % IV SOLN: INTRAVENOUS | Status: DC

## 2016-11-15 MED ORDER — HEPARIN (PORCINE) IN NACL 2-0.9 UNIT/ML-% IJ SOLN
INTRAMUSCULAR | Status: AC | PRN
  Administered 2016-11-15: 1000 mL

## 2016-11-15 MED ORDER — SODIUM CHLORIDE 0.9 % IV SOLN: 250.0000 mL | INTRAVENOUS | Status: DC | PRN

## 2016-11-15 MED ORDER — SODIUM CHLORIDE 0.9% FLUSH: 3.0000 mL | INTRAVENOUS | Status: DC | PRN

## 2016-11-15 MED ORDER — IOPAMIDOL (ISOVUE-370) INJECTION 76%
INTRAVENOUS | Status: AC
  Filled 2016-11-15: qty 100

## 2016-11-15 MED ORDER — VERAPAMIL HCL 2.5 MG/ML IV SOLN
INTRAVENOUS | Status: DC | PRN
  Administered 2016-11-15: 10 mL via INTRA_ARTERIAL

## 2016-11-15 MED ORDER — HEPARIN SODIUM (PORCINE) 1000 UNIT/ML IJ SOLN
INTRAMUSCULAR | Status: DC | PRN
  Administered 2016-11-15: 4000 [IU] via INTRAVENOUS

## 2016-11-15 MED ORDER — SODIUM CHLORIDE 0.9% FLUSH: 3.0000 mL | Freq: Two times a day (BID) | INTRAVENOUS | Status: DC

## 2016-11-15 MED ORDER — HYDRALAZINE HCL 20 MG/ML IJ SOLN
10.0000 mg | Freq: Four times a day (QID) | INTRAMUSCULAR | Status: DC | PRN
  Administered 2016-11-15: 10 mg via INTRAVENOUS

## 2016-11-15 MED ORDER — IOPAMIDOL (ISOVUE-370) INJECTION 76%
INTRAVENOUS | Status: DC | PRN
  Administered 2016-11-15: 90 mL via INTRA_ARTERIAL

## 2016-11-15 MED ORDER — ASPIRIN 81 MG PO CHEW: 81.0000 mg | CHEWABLE_TABLET | ORAL | Status: DC

## 2016-11-15 MED ORDER — FENTANYL CITRATE (PF) 100 MCG/2ML IJ SOLN
INTRAMUSCULAR | Status: AC
  Filled 2016-11-15: qty 2

## 2016-11-15 MED ORDER — HEPARIN (PORCINE) IN NACL 2-0.9 UNIT/ML-% IJ SOLN
INTRAMUSCULAR | Status: AC
  Filled 2016-11-15: qty 1000

## 2016-11-15 MED ORDER — LIDOCAINE HCL (PF) 1 % IJ SOLN
INTRAMUSCULAR | Status: DC | PRN
  Administered 2016-11-15: 15 mL via SUBCUTANEOUS

## 2016-11-15 MED ORDER — SODIUM CHLORIDE 0.9 % IV SOLN
INTRAVENOUS | Status: DC
  Administered 2016-11-15: 11:00:00 via INTRAVENOUS

## 2016-11-15 MED ORDER — FENTANYL CITRATE (PF) 100 MCG/2ML IJ SOLN
INTRAMUSCULAR | Status: DC | PRN
  Administered 2016-11-15: 25 ug via INTRAVENOUS

## 2016-11-15 MED ORDER — HYDRALAZINE HCL 20 MG/ML IJ SOLN
INTRAMUSCULAR | Status: AC
  Administered 2016-11-15: 10 mg via INTRAVENOUS
  Filled 2016-11-15: qty 1

## 2016-11-15 MED ORDER — VERAPAMIL HCL 2.5 MG/ML IV SOLN
INTRAVENOUS | Status: AC
  Filled 2016-11-15: qty 2

## 2016-11-15 MED ORDER — ONDANSETRON HCL 4 MG/2ML IJ SOLN: 4.0000 mg | Freq: Four times a day (QID) | INTRAMUSCULAR | Status: DC | PRN

## 2016-11-15 MED ORDER — MIDAZOLAM HCL 2 MG/2ML IJ SOLN
INTRAMUSCULAR | Status: AC
  Filled 2016-11-15: qty 2

## 2016-11-15 MED ORDER — LIDOCAINE HCL 2 % IJ SOLN
INTRAMUSCULAR | Status: AC
  Filled 2016-11-15: qty 10

## 2016-11-15 MED ORDER — HEPARIN SODIUM (PORCINE) 1000 UNIT/ML IJ SOLN
INTRAMUSCULAR | Status: AC
  Filled 2016-11-15: qty 1

## 2016-11-15 MED ORDER — MIDAZOLAM HCL 2 MG/2ML IJ SOLN
INTRAMUSCULAR | Status: DC | PRN
  Administered 2016-11-15: 1 mg via INTRAVENOUS

## 2016-11-15 SURGICAL SUPPLY — 18 items
CATH EXPO 5F FL3.5 (CATHETERS) ×1 IMPLANT
CATH INFINITI 5FR AL1 (CATHETERS) ×1 IMPLANT
CATH OPTITORQUE TIG 4.0 5F (CATHETERS) ×2 IMPLANT
CATH SWAN GANZ 7F STRAIGHT (CATHETERS) ×2 IMPLANT
COVER PRB 48X5XTLSCP FOLD TPE (BAG) IMPLANT
COVER PROBE 5X48 (BAG) ×2
DEVICE RAD COMP TR BAND LRG (VASCULAR PRODUCTS) ×1 IMPLANT
GLIDESHEATH SLEND A-KIT 6F 22G (SHEATH) ×1 IMPLANT
GUIDEWIRE INQWIRE 1.5J.035X260 (WIRE) ×1 IMPLANT
INQWIRE 1.5J .035X260CM (WIRE) ×2
KIT HEART LEFT (KITS) ×2 IMPLANT
PACK CARDIAC CATHETERIZATION (CUSTOM PROCEDURE TRAY) ×2 IMPLANT
SHEATH GLIDE SLENDER 4/5FR (SHEATH) ×1 IMPLANT
SHEATH PINNACLE 7F 10CM (SHEATH) ×2 IMPLANT
SYR MEDRAD MARK V 150ML (SYRINGE) ×1 IMPLANT
TRANSDUCER W/STOPCOCK (MISCELLANEOUS) ×2 IMPLANT
TUBING CIL FLEX 10 FLL-RA (TUBING) ×2 IMPLANT
WIRE EMERALD ST .035X260CM (WIRE) ×1 IMPLANT

## 2016-11-15 NOTE — Discharge Instructions (Signed)
Radial Site Care °Refer to this sheet in the next few weeks. These instructions provide you with information about caring for yourself after your procedure. Your health care provider may also give you more specific instructions. Your treatment has been planned according to current medical practices, but problems sometimes occur. Call your health care provider if you have any problems or questions after your procedure. °What can I expect after the procedure? °After your procedure, it is typical to have the following: °· Bruising at the radial site that usually fades within 1-2 weeks. °· Blood collecting in the tissue (hematoma) that may be painful to the touch. It should usually decrease in size and tenderness within 1-2 weeks. ° °Follow these instructions at home: °· Take medicines only as directed by your health care provider. °· You may shower 24-48 hours after the procedure or as directed by your health care provider. Remove the bandage (dressing) and gently wash the site with plain soap and water. Pat the area dry with a clean towel. Do not rub the site, because this may cause bleeding. °· Do not take baths, swim, or use a hot tub until your health care provider approves. °· Check your insertion site every day for redness, swelling, or drainage. °· Do not apply powder or lotion to the site. °· Do not flex or bend the affected arm for 24 hours or as directed by your health care provider. °· Do not push or pull heavy objects with the affected arm for 24 hours or as directed by your health care provider. °· Do not lift over 10 lb (4.5 kg) for 5 days after your procedure or as directed by your health care provider. °· Ask your health care provider when it is okay to: °? Return to work or school. °? Resume usual physical activities or sports. °? Resume sexual activity. °· Do not drive home if you are discharged the same day as the procedure. Have someone else drive you. °· You may drive 24 hours after the procedure  unless otherwise instructed by your health care provider. °· Do not operate machinery or power tools for 24 hours after the procedure. °· If your procedure was done as an outpatient procedure, which means that you went home the same day as your procedure, a responsible adult should be with you for the first 24 hours after you arrive home. °· Keep all follow-up visits as directed by your health care provider. This is important. °Contact a health care provider if: °· You have a fever. °· You have chills. °· You have increased bleeding from the radial site. Hold pressure on the site. °Get help right away if: °· You have unusual pain at the radial site. °· You have redness, warmth, or swelling at the radial site. °· You have drainage (other than a small amount of blood on the dressing) from the radial site. °· The radial site is bleeding, and the bleeding does not stop after 30 minutes of holding steady pressure on the site. °· Your arm or hand becomes pale, cool, tingly, or numb. °This information is not intended to replace advice given to you by your health care provider. Make sure you discuss any questions you have with your health care provider. °Document Released: 03/13/2010 Document Revised: 07/17/2015 Document Reviewed: 08/27/2013 °Elsevier Interactive Patient Education © 2018 [employer] °Femoral Site Care °Refer to this sheet in the next few weeks. These instructions provide you with information about caring for yourself after your procedure. Your   health care provider may also give you more specific instructions. Your treatment has been planned according to current medical practices, but problems sometimes occur. Call your health care provider if you have any problems or questions after your procedure. °What can I expect after the procedure? °After your procedure, it is typical to have the following: °· Bruising at the site that usually fades within 1-2 weeks. °· Blood collecting in the tissue (hematoma) that  may be painful to the touch. It should usually decrease in size and tenderness within 1-2 weeks. ° °Follow these instructions at home: °· Take medicines only as directed by your health care provider. °· You may shower 24-48 hours after the procedure or as directed by your health care provider. Remove the bandage (dressing) and gently wash the site with plain soap and water. Pat the area dry with a clean towel. Do not rub the site, because this may cause bleeding. °· Do not take baths, swim, or use a hot tub until your health care provider approves. °· Check your insertion site every day for redness, swelling, or drainage. °· Do not apply powder or lotion to the site. °· Limit use of stairs to twice a day for the first 2-3 days or as directed by your health care provider. °· Do not squat for the first 2-3 days or as directed by your health care provider. °· Do not lift over 10 lb (4.5 kg) for 5 days after your procedure or as directed by your health care provider. °· Ask your health care provider when it is okay to: °? Return to work or school. °? Resume usual physical activities or sports. °? Resume sexual activity. °· Do not drive home if you are discharged the same day as the procedure. Have someone else drive you. °· You may drive 24 hours after the procedure unless otherwise instructed by your health care provider. °· Do not operate machinery or power tools for 24 hours after the procedure or as directed by your health care provider. °· If your procedure was done as an outpatient procedure, which means that you went home the same day as your procedure, a responsible adult should be with you for the first 24 hours after you arrive home. °· Keep all follow-up visits as directed by your health care provider. This is important. °Contact a health care provider if: °· You have a fever. °· You have chills. °· You have increased bleeding from the site. Hold pressure on the site. °Get help right away if: °· You have  unusual pain at the site. °· You have redness, warmth, or swelling at the site. °· You have drainage (other than a small amount of blood on the dressing) from the site. °· The site is bleeding, and the bleeding does not stop after 30 minutes of holding steady pressure on the site. °· Your leg or foot becomes pale, cool, tingly, or numb. °This information is not intended to replace advice given to you by your health care provider. Make sure you discuss any questions you have with your health care provider. °Document Released: 10/12/2013 Document Revised: 07/17/2015 Document Reviewed: 08/28/2013 °Elsevier Interactive Patient Education © 2018 [employer] ° °

## 2016-11-15 NOTE — H&P (View-Only) (Signed)
Cardiology Office Note    Date:  11/10/2016  ID:  Becky Mccall, DOB [birth year], MRN [id number] PCP:  Becky Stains, MD  Cardiologist:  Dr. [name]   Chief Complaint: discuss cath  History of Present Illness:  Becky Mccall is a 83 y.o. female with history of COPD, AS, carotid artery disease (1-39% 04/2013), hard of hearing, hyperlipidemia, TIA, HTN, hypothyroidism, GERD, ?CKD (updated stage unknown), significant anemia 04/2013 who recently presented back to Dr. [name] to re-establish care. She was previously seen 4 years ago but lost to follow-up. She was recently referred back to Dr. [name] for evaluation of pre-syncope and DOE. She was also planning to have knee replacement surgery sometime this fall and her PCP wanted her AV reevaluated. She reported labile BP, sometimes normal, high, or dropping into the 90s. She was asked to use compression hose. 2D echo 11/01/16 showed moderate LVH, normal LV function, grade 1 diastolic dysfunction, high ventricular filling pressure,  severe aortic stenosis, mild mitral regurgitation, severely dilated left atrium, mild TR. Dr. [name] recommended she return today for follow-up visit to arrange and discuss right and left heart cath.    She returns for follow-up with her daughter Becky Mccall. She does report over the last year she's been experiencing episodic dyspnea on exertion, requiring her to stop and rest. She has also noticed an associated sensation of chest tightness with that. These improve with rest. The chest tightness is less frequent than the dyspnea. No marked change recently, but gradual onset over a period of time. She lives alone and cooks, cleans, and mows her own lawn. She does not use any assistive aid. Her mobility has also been limited by need for possible knee replacement (which prompted above workup in the first place).  No labs on file since 04/2013 when A1C was normal, LDL 145 ("unable to take meds besides fish oil"), BUN 27, Cr 1.30, Hgb  8.4 (previously 13.4 in 2014) - this was in the setting of a workup for vertigo with documentation of no overt bleeding during admission, OP workup recommended. She does not recall being told she's had any issues with this. She has not had any full syncope since that time but does have dizzy spells when standing too quickly.   Past Medical History:  Diagnosis Date  . Abrasion of left lower extremity 12/2012   draining wound on LLL;  . Anemia    a. Hgb 8.4 (2015), previously 13.4 in 2014.  . Arthritis    OA of hands and knees  . Carotid stenosis    1-39% bilateral in 2015  . Cervical spondylolysis   . Chronic diastolic CHF (congestive heart failure) ([place])   . Colon polyp   . COPD (chronic obstructive pulmonary disease) (HCC)    Spiriva inhaler daily  . DDD (degenerative disc disease), lumbar   . Fibrocystic breast disease   . GERD (gastroesophageal reflux disease)   . Hard of hearing    wears hearing aides both ears  . Hyperlipidemia    can't take the meds so takes Fish Oil  . Hypertension   . Hypothyroidism   . IBS (irritable bowel syndrome)   . Intermittent dysphagia    for pills and solids  . OSA (obstructive sleep apnea)   . Osteopenia   . Severe aortic stenosis   . Squamous cell skin cancer   . TIA (transient ischemic attack)   . Vitamin D deficiency     Past Surgical History:  Procedure Laterality Date  .  ABDOMINAL HYSTERECTOMY    . BACK SURGERY  2009  . CATARACT EXTRACTION W/ INTRAOCULAR LENS  IMPLANT, BILATERAL  1999,2000  . EYE SURGERY    . HEMORROIDECTOMY    . LUMBAR LAMINECTOMY  1999    Current Medications: Current Meds  Medication Sig  . aspirin EC 325 MG tablet Take 1 tablet (325 mg total) by mouth daily.  . Calcium Carb-Cholecalciferol (CALCIUM 1000 + D PO) Take 1 tablet by mouth 2 (two) times daily.  . Cholecalciferol (VITAMIN D) 2000 UNITS tablet Take 6,000 Units by mouth daily.   . Cinnamon 500 MG capsule Take 500 mg by mouth 3 (three) times  daily.  . diphenhydrAMINE (BENADRYL) 25 mg capsule Take 25 mg by mouth at bedtime as needed for sleep.  Marland Kitchen levothyroxine (SYNTHROID, LEVOTHROID) 137 MCG tablet Take 137 mcg by mouth daily before breakfast.  . losartan (COZAAR) 25 MG tablet Take 25 mg by mouth daily.  . Magnesium 500 MG TABS Take 2 tablets by mouth daily.  . meclizine (ANTIVERT) 25 MG tablet Take 12.5-25 mg by mouth every 6 (six) hours as needed for dizziness.  . Multiple Vitamin (MULTIVITAMIN WITH MINERALS) TABS tablet Take 1 tablet by mouth daily.  Marland Kitchen omega-3 acid ethyl esters (LOVAZA) 1 G capsule Take 2 capsules (2 g total) by mouth 3 (three) times daily.  . traMADol (ULTRAM) 50 MG tablet Take 1 tablet (50 mg total) by mouth every 8 (eight) hours as needed (pain not relieved by tylenol).  . Turmeric 500 MG CAPS Take 500 mg by mouth 2 (two) times daily.  . vitamin B-12 (CYANOCOBALAMIN) 1000 MCG tablet Take 1,000 mcg by mouth daily.  . vitamin C (ASCORBIC ACID) 500 MG tablet Take 500 mg by mouth daily.  . vitamin E 400 UNIT capsule Take 800 Units by mouth daily.      Allergies:   Lisinopril; Neosporin [neomycin-bacitracin zn-polymyx]; Relafen [nabumetone]; Statins; Welchol [colesevelam]; Zetia [ezetimibe]; and Zinc oxide   Social History   Social History  . Marital status: Married    Spouse name: irvin  . Number of children: 2  . Years of education: 10th   Occupational History  . retired    Social History Main Topics  . Smoking status: Former Smoker    Packs/day: 3.00    Years: 40.00    Types: Cigarettes  . Smokeless tobacco: Never Used     Comment: quit in June 11,1999  . Alcohol use 3.5 oz/week    7 Standard drinks or equivalent per week  . Drug use: No  . Sexual activity: No   Other Topics Concern  . None   Social History Narrative   Patient lives at home with her husband   Patient drinks coffee, sodas   Patient has 2 children    Patient is right handed    Patient is retired           Family  History:  Family History  Problem Relation Age of Onset  . Cancer - Lung Mother   . Heart attack Father   . Cancer - Lung Brother   . Cancer - Lung Maternal Aunt     ROS:   Please see the history of present illness.  All other systems are reviewed and otherwise negative.    PHYSICAL EXAM:   VS:  BP 140/68   Pulse 64   Ht 5\' 6"  (1.676 m)   Wt 165 lb 1.9 oz (74.9 kg)   LMP  (LMP Unknown)  BMI 26.65 kg/m   BMI: Body mass index is 26.65 kg/m. GEN: Well nourished, well developed WF, pleasant, well groomed, in no acute distress  HEENT: normocephalic, atraumatic Neck: no JVD, carotid bruits, or masses Cardiac: RRR; 2/6 SEM with diminished S2. No rubs or gallops, no edema  Respiratory:  clear to auscultation bilaterally, normal work of breathing GI: soft, nontender, nondistended, + BS MS: no deformity or atrophy  Skin: warm and dry, no rash Neuro:  Alert and Oriented x 3, Strength and sensation are intact, follows commands, hard of hearing (L ear with hearing aid) Psych: euthymic mood, full affect  Wt Readings from Last 3 Encounters:  11/10/16 165 lb 1.9 oz (74.9 kg)  10/28/16 167 lb 1.9 oz (75.8 kg)  09/14/13 173 lb (78.5 kg)      Studies/Labs Reviewed:   EKG: EKG was not ordered today.  Recent Labs: No results found for requested labs within last 8760 hours.   Lipid Panel    Component Value Date/Time   CHOL 226 (H) 05/05/2013 0630   TRIG 149 05/05/2013 0630   HDL 51 05/05/2013 0630   CHOLHDL 4.4 05/05/2013 0630   VLDL 30 05/05/2013 0630   LDLCALC 145 (H) 05/05/2013 0630    Additional studies/ records that were reviewed today include: Summarized above    ASSESSMENT & PLAN:   1. Severe aortic stenosis - per recommendation from Dr. [name], will proceed with right and left heart cath. Risks and benefits of cardiac catheterization have been discussed with the patient.  These include bleeding, infection, kidney damage, stroke, heart attack, death.  The patient  understands these risks and is willing to proceed. We do not have any recent data regarding her renal function (I suspect prior CKD) and prior anemia. Will check pre-cath screening labs including BMET, CBC, PT/INR. Will plan to write pre-cath orders when labs become available. Addendum: After the patient's visit, I brought her case to Dr. Burt Knack to get his input whether she would be a candidate for SAVR vs TAVR. Her advanced age, decreased mobility related to knee issues and other comorbidities make her higher risk for conventional aortic valve replacement. Per STS Risk Calculator, her predicted risk of mortality with conventional SAVR is 4.944% thus she may actually be a more favorable candidate for TAVR. Per our discussion, will send a message to Theodosia Quay, structural heart coordinator, to arrange a visit with Dr. Maryelizabeth Rowan after her cath to further discuss.  2. Suspected chronic diastolic CHF - appears compensated today; difficult to know if there is contribution of this to her dyspnea given her concomitant severe aortic stenosis. Will also check BNP. 3. Labile HTN - currently controlled. Would not press regimen much further given h/o dizziness and aortic stenosis. Add thyroid to labs. 4. Anemia - prior h/o this in 2015, unclear what workup she has since had or what recent hemoglobin has run. Will repeat CBC with pre-cath labs. Denies any bleeding.  Disposition: F/u with Dr. Betsy Pries team APP after above studies.   Medication Adjustments/Labs and Tests Ordered: Current medicines are reviewed at length with the patient today.  Concerns regarding medicines are outlined above. Medication changes, Labs and Tests ordered today are summarized above and listed in the Patient Instructions accessible in Encounters.   Signed, Charlie Pitter, PA-C  11/10/2016 2:59 PM    [facility] [address], [place], [place]  [address] Phone: [phone]; Fax: [phone]

## 2016-11-15 NOTE — Progress Notes (Signed)
Site area: Right groin a 7 french venous sheath was removed by Millicent Sloan RTR  Site Prior to Removal:  Level 0  Pressure Applied For 20 MINUTES    Bedrest Beginning at  1425p  Manual:   Yes.    Patient Status During Pull:  stable  Post Pull Groin Site:  Level 0  Post Pull Instructions Given:  Yes.    Post Pull Pulses Present:  Yes.    Dressing Applied:  Yes.    Comments:  VS remain stable during sheath pull

## 2016-11-15 NOTE — Interval H&P Note (Signed)
History and Physical Interval Note:  11/15/2016 12:04 PM  Becky Mccall  has presented today for surgery, with the diagnosis of servere aortic stenosis. Pre-op R&LHC.  The various methods of treatment have been discussed with the patient and family. After consideration of risks, benefits and other options for treatment, the patient has consented to  Procedure(s): RIGHT/LEFT HEART CATH AND CORONARY ANGIOGRAPHY (N/A) as a surgical intervention .  The patient's history has been reviewed, patient examined, no change in status, stable for surgery.  I have reviewed the patient's chart and labs.  Questions were answered to the patient's satisfaction.     Glenetta Hew

## 2016-11-16 ENCOUNTER — Encounter (HOSPITAL_COMMUNITY): Payer: Self-pay | Admitting: Cardiology

## 2016-11-16 MED FILL — Lidocaine HCl Local Inj 2%: INTRAMUSCULAR | Qty: 10 | Status: AC

## 2016-11-17 ENCOUNTER — Other Ambulatory Visit: Payer: Self-pay | Admitting: Physician Assistant

## 2016-11-17 ENCOUNTER — Other Ambulatory Visit: Payer: Self-pay

## 2016-11-17 DIAGNOSIS — I350 Nonrheumatic aortic (valve) stenosis: Secondary | ICD-10-CM

## 2016-11-17 DIAGNOSIS — N289 Disorder of kidney and ureter, unspecified: Secondary | ICD-10-CM

## 2016-11-17 DIAGNOSIS — N189 Chronic kidney disease, unspecified: Secondary | ICD-10-CM

## 2016-11-18 ENCOUNTER — Other Ambulatory Visit: Payer: Self-pay | Admitting: Physician Assistant

## 2016-11-18 ENCOUNTER — Telehealth: Payer: Self-pay | Admitting: Cardiology

## 2016-11-18 ENCOUNTER — Encounter: Payer: Self-pay | Admitting: Physician Assistant

## 2016-11-18 DIAGNOSIS — I350 Nonrheumatic aortic (valve) stenosis: Secondary | ICD-10-CM

## 2016-11-18 NOTE — Telephone Encounter (Signed)
Spoke with Arsenio Katz RN and Bonney Leitz PA called patient. Will forward to [facility]

## 2016-11-18 NOTE — Telephone Encounter (Signed)
New message ° ° ° ° °Returning a call to the nurse °

## 2016-11-19 LAB — POCT I-STAT 3, VENOUS BLOOD GAS (G3P V)
Acid-Base Excess: 3 mmol/L — ABNORMAL HIGH (ref 0.0–2.0)
Bicarbonate: 27.8 mmol/L (ref 20.0–28.0)
O2 Saturation: 79 %
PH VEN: 7.421 (ref 7.250–7.430)
PO2 VEN: 42 mmHg (ref 32.0–45.0)
TCO2: 29 mmol/L (ref 22–32)
pCO2, Ven: 42.8 mmHg — ABNORMAL LOW (ref 44.0–60.0)

## 2016-11-22 ENCOUNTER — Encounter: Payer: Self-pay | Admitting: Physician Assistant

## 2016-11-22 NOTE — Progress Notes (Signed)
Cardiology Office Note    Date:  11/23/2016  ID:  Becky Mccall, Becky Mccall January 19, 1930, MRN [id number] PCP:  [name], MD  Cardiologist: Dr. [name]   Chief Complaint: f/u cath  History of Present Illness:  Becky Mccall is a 83 y.o. female with history of recently progressive severe AS, mild CAD, COPD,carotid artery disease (1-39% 04/2013), hard of hearing, hyperlipidemia, TIA, HTN, hypothyroidism, GERD, probable CKD stage III, anemia 04/2013 who recently presented back to Dr. [name] to re-establish care. She was seen 4 years ago but lost to follow-up. She was recently referred back to Dr. [name] for evaluation of pre-syncope and DOE. She was planning to have knee replacement surgery sometime this fall but her PCP wanted her AV reevaluated prior to clearing. She also has history of vertiginous-type dizziness. She also has history of labile BP, sometimes normal, high, or dropping into the 90s but not specifically related to the dizziness. 2D echo 11/01/16 showed moderate LVH, normal LV function, grade 1 diastolic dysfunction, high ventricular filling pressure,  severe aortic stenosis, mild mitral regurgitation, severely dilated left atrium, mild TR. At f/u OV 11/10/16 she did report experiencing episodic dyspnea on exertion, requiring her to stop and rest. She also has noticed an associated sensation of chest tightness with that, improving with rest. She lives alone and cooks and cleans for herself. She is accompanied by daughter [name]. She does not use any assistive aid. Her mobility has been limited by need for possible knee replacement (which prompted above workup in the first place). Due to her progression in aortic stenosis, she underwent [facility] 11/15/16 showing normal LVEDP, mild nonobstructive CAD (30% prox RCA, 40% ostial RPDA, 45% D1, 45% dLAD), severe AS with mean gradient 40.15mmHg, AVA 0.76cm2. She is now being followed peripherally by the structural heart team in anticipation of TAVR with  CT scans arranged for 10/3, caroid duplex, PFTs and consultation with Dr. Roxy Manns on 11/30/16. Last labs 11/10/16 showed Cr 1.29 (was 1.05 on 08/30/16 at PCP), normal BNP, Hgb 13.1, normal TSH.  She returns for follow-up today without any interim cath complications. Her symptoms are stable as above. No interim syncope or LEE. She reports blood pressure was running very high at time of cath off losartan so much that they had to give her something in the hospital for it. BP is running higher today. She did not yet take her losartan today - she has her CT scans planned for tomorrow.   Past Medical History:  Diagnosis Date  . Abrasion of left lower extremity 12/2012   draining wound on LLL;  . Anemia    a. Hgb 8.4 (2015), previously 13.4 in 2014.  . Arthritis    OA of hands and knees  . CAD in native artery    a.  [facility] 11/15/16 showing normal LVEDP, mild nonobstructive CAD (30% prox RCA, 40% ostial RPDA, 45% D1, 45% dLAD), severe AS with mean gradient 40.54mmHg, AVA 0.76cm2.   . Carotid stenosis    1-39% bilateral in 2015  . Cervical spondylolysis   . CKD (chronic kidney disease), stage III ([place])   . Colon polyp   . COPD (chronic obstructive pulmonary disease) (HCC)    Spiriva inhaler daily  . DDD (degenerative disc disease), lumbar   . Fibrocystic breast disease   . GERD (gastroesophageal reflux disease)   . Hard of hearing    wears hearing aides both ears  . Hyperlipidemia    can't take the meds so takes Fish Oil  .  Hypertension   . Hypothyroidism   . IBS (irritable bowel syndrome)   . Intermittent dysphagia    for pills and solids  . OSA (obstructive sleep apnea)   . Osteopenia   . Severe aortic stenosis   . Squamous cell skin cancer   . TIA (transient ischemic attack)   . Vitamin D deficiency     Past Surgical History:  Procedure Laterality Date  . ABDOMINAL HYSTERECTOMY    . BACK SURGERY  2009  . CATARACT EXTRACTION W/ INTRAOCULAR LENS  IMPLANT, BILATERAL  1999,2000  . EYE  SURGERY    . HEMORROIDECTOMY    . LUMBAR LAMINECTOMY  1999  . RIGHT/LEFT HEART CATH AND CORONARY ANGIOGRAPHY N/A 11/15/2016   Procedure: RIGHT/LEFT HEART CATH AND CORONARY ANGIOGRAPHY;  Surgeon: [name], MD;  Location: [facility];  Service: Cardiovascular;  Laterality: N/A;    Current Medications: Current Meds  Medication Sig  . aspirin EC 81 MG tablet Take 1 tablet (81 mg total) by mouth daily.  . Calcium Carb-Cholecalciferol (CALCIUM 1000 + D PO) Take 1 tablet by mouth daily with lunch.   . Cholecalciferol (VITAMIN D) 2000 UNITS tablet Take 6,000 Units by mouth daily with lunch.   . Cinnamon 500 MG capsule Take 500 mg by mouth 2 (two) times daily.   . Doxylamine Succinate, Sleep, (EQ NIGHTTIME SLEEP AID, DOXYL, PO) Take 50 mg by mouth at bedtime.  Marland Kitchen levothyroxine (SYNTHROID, LEVOTHROID) 137 MCG tablet Take 137 mcg by mouth daily before breakfast.  . losartan (COZAAR) 25 MG tablet Take 25 mg by mouth daily.  . Magnesium 500 MG TABS Take 1,000 mg by mouth at bedtime.   . meclizine (ANTIVERT) 25 MG tablet Take 12.5-25 mg by mouth every 6 (six) hours as needed for dizziness.  . Multiple Vitamin (MULTIVITAMIN WITH MINERALS) TABS tablet Take 1 tablet by mouth daily.  Marland Kitchen omega-3 acid ethyl esters (LOVAZA) 1 g capsule Take 1 g by mouth 3 (three) times daily.  . traMADol (ULTRAM) 50 MG tablet Take 1 tablet (50 mg total) by mouth every 8 (eight) hours as needed (pain not relieved by tylenol).  . Turmeric 500 MG CAPS Take 500 mg by mouth 2 (two) times daily.  . vitamin B-12 (CYANOCOBALAMIN) 1000 MCG tablet Take 1,000 mcg by mouth daily.  . vitamin C (ASCORBIC ACID) 500 MG tablet Take 500 mg by mouth daily.  . vitamin E 400 UNIT capsule Take 400 Units by mouth 2 (two) times daily.   . [DISCONTINUED] omega-3 acid ethyl esters (LOVAZA) 1 G capsule Take 2 capsules (2 g total) by mouth 3 (three) times daily. (Patient taking differently: Take 1 g by mouth 3 (three) times daily. )      Allergies:   Lisinopril; Relafen [nabumetone]; Statins; Welchol [colesevelam]; Zetia [ezetimibe]; Neosporin [neomycin-bacitracin zn-polymyx]; and Zinc oxide   Social History   Social History  . Marital status: Widowed    Spouse name: irvin  . Number of children: 2  . Years of education: 10th   Occupational History  . retired    Social History Main Topics  . Smoking status: Former Smoker    Packs/day: 3.00    Years: 40.00    Types: Cigarettes  . Smokeless tobacco: Never Used     Comment: quit in June 11,1999  . Alcohol use 3.5 oz/week    7 Standard drinks or equivalent per week  . Drug use: No  . Sexual activity: No   Other Topics Concern  .  None   Social History Narrative   Patient lives at home with her husband   Patient drinks coffee, sodas   Patient has 2 children    Patient is right handed    Patient is retired           Family History:  Family History  Problem Relation Age of Onset  . Cancer - Lung Mother   . Heart attack Father   . Cancer - Lung Brother   . Cancer - Lung Maternal Aunt     ROS:   Please see the history of present illness. All other systems are reviewed and otherwise negative.    PHYSICAL EXAM:   VS:  BP (!) 166/78   Pulse (!) 59   Ht 5\' 6"  (1.676 m)   Wt 167 lb 12.8 oz (76.1 kg)   LMP  (LMP Unknown)   SpO2 92%   BMI 27.08 kg/m   BMI: Body mass index is 27.08 kg/m. GEN: Well nourished, well developed WF, in no acute distress, pleasant, well groomed HEENT: normocephalic, atraumatic Neck: no JVD, carotid bruits, or masses Cardiac: RRR; 2/6 SEM with diminished S2. No rubs or gallops, no edema  Respiratory:  clear to auscultation bilaterally, normal work of breathing GI: soft, nontender, nondistended, + BS MS: no deformity or atrophy  Skin: warm and dry, no rash, right radial cath site without hematoma; good pulse (mild ecchymosis - appears c/w senile purpura) Neuro:  Alert and Oriented x 3, Strength and sensation are intact,  follows commands, hard of hearing (L ear with hearing aid). Right groin cath site without hematoma, ecchymosis, or bruit. Psych: euthymic mood, full affect  Wt Readings from Last 3 Encounters:  11/23/16 167 lb 12.8 oz (76.1 kg)  11/15/16 165 lb (74.8 kg)  11/10/16 165 lb 1.9 oz (74.9 kg)      Studies/Labs Reviewed:   EKG:   EKG was not ordered today.  Recent Labs: 11/10/2016: BUN 18; Creatinine, Ser 1.29; Hemoglobin 13.1; NT-Pro BNP 379; Platelets 210; Potassium 4.5; Sodium 140; TSH 2.870   Lipid Panel    Component Value Date/Time   CHOL 226 (H) 05/05/2013 0630   TRIG 149 05/05/2013 0630   HDL 51 05/05/2013 0630   CHOLHDL 4.4 05/05/2013 0630   VLDL 30 05/05/2013 0630   LDLCALC 145 (H) 05/05/2013 0630    Additional studies/ records that were reviewed today include: Summarized above.    ASSESSMENT & PLAN:   1. Severe aortic stenosis - discussed disease process, symptoms, and natural progression of disease if left untreated. She is currently undergoing workup with interdisciplinary valve team for TAVR. Has CT scans tomorrow as well as PFTs, carotid duplex and f/u pending with Dr. Roxy Manns. Appreciate their assistance and management of this very pleasant lady. 2. Essential HTN - we are having to intermittently hold Ms. Olsson's ARB due to contrast-requiring procedures and CKD, resulting in fluctuations in BP. D/w Dr. [name]. Will stop losartan and start amlodipine 2.5mg  daily instead. No need to hold this prior to any future procedures. 3. CKD stage III - recheck BMET today stat in prep for CT scans tomorrow. 4. Mild CAD - prior intolerance to statin as well as fish oil. PCSK9 could be considered once acute issues are sorted out - patient currently already has multiple appointments for pre-operative testing. Continue ASA. Cath site appears without complication. She does note she had some blisters under the tape used at her groin site post-cath. I have added tape to her allergy list  to be cognizant in the future. She did not have any issues with the tegaderm used on her radial site.  Disposition: F/u with Dr. Roxy Manns as arranged 10/9, which will then dictate further cardiac follow-up depending on next steps.  Medication Adjustments/Labs and Tests Ordered: Current medicines are reviewed at length with the patient today.  Concerns regarding medicines are outlined above. Medication changes, Labs and Tests ordered today are summarized above and listed in the Patient Instructions accessible in Encounters.   Signed, Charlie Pitter, PA-C  11/23/2016 11:14 AM    [facility] [address], [place], [place]  [address] Phone: [phone]; Fax: [phone]

## 2016-11-23 ENCOUNTER — Encounter: Payer: Self-pay | Admitting: Physician Assistant

## 2016-11-23 ENCOUNTER — Encounter (INDEPENDENT_AMBULATORY_CARE_PROVIDER_SITE_OTHER): Payer: Self-pay

## 2016-11-23 ENCOUNTER — Ambulatory Visit (INDEPENDENT_AMBULATORY_CARE_PROVIDER_SITE_OTHER): Payer: Medicare Other | Admitting: Physician Assistant

## 2016-11-23 VITALS — BP 166/78 | HR 59 | Ht 66.0 in | Wt 167.8 lb

## 2016-11-23 DIAGNOSIS — N183 Chronic kidney disease, stage 3 unspecified: Secondary | ICD-10-CM

## 2016-11-23 DIAGNOSIS — Z23 Encounter for immunization: Secondary | ICD-10-CM

## 2016-11-23 DIAGNOSIS — I350 Nonrheumatic aortic (valve) stenosis: Secondary | ICD-10-CM | POA: Diagnosis not present

## 2016-11-23 DIAGNOSIS — I2510 Atherosclerotic heart disease of native coronary artery without angina pectoris: Secondary | ICD-10-CM | POA: Diagnosis not present

## 2016-11-23 DIAGNOSIS — I10 Essential (primary) hypertension: Secondary | ICD-10-CM

## 2016-11-23 LAB — BASIC METABOLIC PANEL
BUN / CREAT RATIO: 14 (ref 12–28)
BUN: 14 mg/dL (ref 8–27)
CO2: 28 mmol/L (ref 20–29)
Calcium: 9.8 mg/dL (ref 8.7–10.3)
Chloride: 103 mmol/L (ref 96–106)
Creatinine, Ser: 1.02 mg/dL — ABNORMAL HIGH (ref 0.57–1.00)
GFR calc Af Amer: 58 mL/min/{1.73_m2} — ABNORMAL LOW (ref >59)
GFR calc non Af Amer: 50 mL/min/{1.73_m2} — ABNORMAL LOW (ref >59)
GLUCOSE: 80 mg/dL (ref 65–99)
POTASSIUM: 4.3 mmol/L (ref 3.5–5.2)
SODIUM: 143 mmol/L (ref 134–144)

## 2016-11-23 MED ORDER — AMLODIPINE BESYLATE 2.5 MG PO TABS: 2.5000 mg | ORAL_TABLET | Freq: Every day | ORAL | 1 refills | Status: DC

## 2016-11-23 NOTE — Patient Instructions (Addendum)
Medication Instructions:  Your physician has recommended you make the following change in your medication:  1.  STOP the Losartan 2.  START Amlodipine 2.5 daily  Labwork: TODAY:  STAT BMET  Testing/Procedures: None ordered  Follow-Up: Your physician recommends that you schedule a follow-up appointment in: KEEP ALL SCHEDULED APPOINTMENTS   Any Other Special Instructions Will Be Listed Below (If Applicable). If you need a refill on your cardiac medications before your next appointment, please call your pharmacy.

## 2016-11-24 ENCOUNTER — Ambulatory Visit (HOSPITAL_COMMUNITY)
Admission: RE | Admit: 2016-11-24 | Discharge: 2016-11-24 | Disposition: A | Discharge status: Home / Self Care | Payer: Medicare Other | Source: Ambulatory Visit | Attending: Physician Assistant | Admitting: Physician Assistant

## 2016-11-24 ENCOUNTER — Encounter (HOSPITAL_COMMUNITY): Payer: Self-pay

## 2016-11-24 ENCOUNTER — Ambulatory Visit (HOSPITAL_COMMUNITY): Payer: Medicare Other

## 2016-11-24 ENCOUNTER — Ambulatory Visit (HOSPITAL_BASED_OUTPATIENT_CLINIC_OR_DEPARTMENT_OTHER)
Admission: RE | Admit: 2016-11-24 | Discharge: 2016-11-24 | Disposition: A | Discharge status: Home / Self Care | Payer: Medicare Other | Source: Ambulatory Visit | Attending: Physician Assistant | Admitting: Physician Assistant

## 2016-11-24 ENCOUNTER — Ambulatory Visit (INDEPENDENT_AMBULATORY_CARE_PROVIDER_SITE_OTHER): Payer: Medicare Other | Admitting: Orthopaedic Surgery

## 2016-11-24 DIAGNOSIS — R918 Other nonspecific abnormal finding of lung field: Secondary | ICD-10-CM | POA: Diagnosis not present

## 2016-11-24 DIAGNOSIS — I350 Nonrheumatic aortic (valve) stenosis: Secondary | ICD-10-CM

## 2016-11-24 DIAGNOSIS — I700 Atherosclerosis of aorta: Secondary | ICD-10-CM | POA: Diagnosis not present

## 2016-11-24 DIAGNOSIS — I517 Cardiomegaly: Secondary | ICD-10-CM | POA: Insufficient documentation

## 2016-11-24 DIAGNOSIS — R188 Other ascites: Secondary | ICD-10-CM | POA: Diagnosis not present

## 2016-11-24 DIAGNOSIS — I2510 Atherosclerotic heart disease of native coronary artery without angina pectoris: Secondary | ICD-10-CM | POA: Diagnosis not present

## 2016-11-24 DIAGNOSIS — K5730 Diverticulosis of large intestine without perforation or abscess without bleeding: Secondary | ICD-10-CM | POA: Insufficient documentation

## 2016-11-24 DIAGNOSIS — N289 Disorder of kidney and ureter, unspecified: Secondary | ICD-10-CM | POA: Diagnosis not present

## 2016-11-24 DIAGNOSIS — I358 Other nonrheumatic aortic valve disorders: Secondary | ICD-10-CM | POA: Diagnosis not present

## 2016-11-24 DIAGNOSIS — Z01818 Encounter for other preprocedural examination: Secondary | ICD-10-CM | POA: Insufficient documentation

## 2016-11-24 LAB — PULMONARY FUNCTION TEST
DL/VA % PRED: 63 %
DL/VA: 3.19 ml/min/mmHg/L
DLCO unc % pred: 54 %
DLCO unc: 14.7 ml/min/mmHg
FEF 25-75 POST: 1.36 L/s
FEF 25-75 Pre: 0.86 L/sec
FEF2575-%CHANGE-POST: 57 %
FEF2575-%PRED-POST: 107 %
FEF2575-%Pred-Pre: 67 %
FEV1-%CHANGE-POST: 9 %
FEV1-%PRED-PRE: 79 %
FEV1-%Pred-Post: 87 %
FEV1-PRE: 1.56 L
FEV1-Post: 1.72 L
FEV1FVC-%CHANGE-POST: -1 %
FEV1FVC-%PRED-PRE: 94 %
FEV6-%Change-Post: 10 %
FEV6-%Pred-Post: 100 %
FEV6-%Pred-Pre: 90 %
FEV6-POST: 2.5 L
FEV6-Pre: 2.26 L
FEV6FVC-%Change-Post: 0 %
FEV6FVC-%PRED-POST: 104 %
FEV6FVC-%Pred-Pre: 105 %
FVC-%CHANGE-POST: 11 %
FVC-%PRED-POST: 96 %
FVC-%PRED-PRE: 86 %
FVC-POST: 2.55 L
FVC-PRE: 2.28 L
POST FEV1/FVC RATIO: 67 %
PRE FEV6/FVC RATIO: 99 %
Post FEV6/FVC ratio: 98 %
Pre FEV1/FVC ratio: 69 %
RV % pred: 126 %
RV: 3.27 L
TLC % pred: 108 %
TLC: 5.83 L

## 2016-11-24 MED ORDER — IOPAMIDOL (ISOVUE-370) INJECTION 76%
100.0000 mL | Freq: Once | INTRAVENOUS | Status: AC | PRN
  Administered 2016-11-24: 100 mL via INTRAVENOUS

## 2016-11-24 MED ORDER — IOPAMIDOL (ISOVUE-370) INJECTION 76%
INTRAVENOUS | Status: AC
  Filled 2016-11-24: qty 100

## 2016-11-24 MED ORDER — SODIUM BICARBONATE BOLUS VIA INFUSION
INTRAVENOUS | Status: DC
  Filled 2016-11-24: qty 1

## 2016-11-24 MED ORDER — SODIUM BICARBONATE 8.4 % IV SOLN
INTRAVENOUS | Status: AC
  Administered 2016-11-24: 09:00:00 via INTRAVENOUS
  Filled 2016-11-24: qty 500

## 2016-11-24 MED ORDER — ALBUTEROL SULFATE (2.5 MG/3ML) 0.083% IN NEBU
2.5000 mg | INHALATION_SOLUTION | Freq: Once | RESPIRATORY_TRACT | Status: AC
  Administered 2016-11-24: 2.5 mg via RESPIRATORY_TRACT

## 2016-11-24 NOTE — Progress Notes (Signed)
VASCULAR LAB PRELIMINARY  PRELIMINARY  PRELIMINARY  PRELIMINARY  Carotid duplex completed.    Preliminary report:  Bilateral:  1-39% ICA stenosis.  Vertebral artery flow is antegrade.     Niaomi Cartaya, RVS 11/24/2016, 1:58 PM

## 2016-11-25 LAB — VAS US CAROTID
LCCADDIAS: 9 cm/s
LEFT ECA DIAS: -1 cm/s
LEFT VERTEBRAL DIAS: 19 cm/s
LICADSYS: -119 cm/s
Left CCA dist sys: 54 cm/s
Left CCA prox dias: 13 cm/s
Left CCA prox sys: 79 cm/s
Left ICA dist dias: -29 cm/s
Left ICA prox dias: -13 cm/s
Left ICA prox sys: -69 cm/s
RCCADSYS: -104 cm/s
RIGHT ECA DIAS: -2 cm/s
RIGHT VERTEBRAL DIAS: -13 cm/s
Right CCA prox dias: 13 cm/s
Right CCA prox sys: 109 cm/s

## 2016-11-30 ENCOUNTER — Encounter: Payer: Medicare Other | Admitting: Thoracic Surgery (Cardiothoracic Vascular Surgery)

## 2016-11-30 ENCOUNTER — Ambulatory Visit: Payer: Medicare Other | Admitting: Physical Therapy

## 2016-12-02 ENCOUNTER — Institutional Professional Consult (permissible substitution) (INDEPENDENT_AMBULATORY_CARE_PROVIDER_SITE_OTHER): Payer: Medicare Other | Admitting: Surgery

## 2016-12-02 ENCOUNTER — Other Ambulatory Visit: Payer: Self-pay

## 2016-12-02 ENCOUNTER — Encounter: Payer: Self-pay | Admitting: Surgery

## 2016-12-02 VITALS — BP 143/73 | HR 82 | Ht 66.0 in | Wt 165.0 lb

## 2016-12-02 DIAGNOSIS — I350 Nonrheumatic aortic (valve) stenosis: Secondary | ICD-10-CM

## 2016-12-02 NOTE — Pre-Procedure Instructions (Signed)
Becky Mccall  12/02/2016      [id number] - [address] [place] [address] Phone: [phone] Fax: [phone]  [facility] ORDER) [place], [place] [address] Phone: [phone] Fax: [phone]    Your procedure is scheduled on October 16  Report to [place] at 1100 A.M.  Call this number if you have problems the morning of surgery:  [phone]   Remember:  Do not eat food or drink liquids after midnight.  Continue all other medications as directed by your physician except follow these medication instructions before surgery   Take these medicines the morning of surgery with A SIP OF WATER  amLODipine (NORVASC) levothyroxine (SYNTHROID, LEVOTHROID)  meclizine (ANTIVERT)  7 days prior to surgery STOP taking any Aspirin (unless otherwise instructed by your surgeon), Aleve, Naproxen, Ibuprofen, Motrin, Advil, Goody's, BC's, all herbal medications, fish oil, and all vitamins   Do not wear jewelry, make-up or nail polish.  Do not wear lotions, powders, or perfumes, or deoderant.  Do not shave 48 hours prior to surgery.  .  Do not bring valuables to the hospital.  [facility] is not responsible for any belongings or valuables.  Contacts, dentures or bridgework may not be worn into surgery.  Leave your suitcase in the car.  After surgery it may be brought to your room.  For patients admitted to the hospital, discharge time will be determined by your treatment team.  Patients discharged the day of surgery will not be allowed to drive home.    Special instructions:   [place]- Preparing For Surgery  Before surgery, you can play an important role. Because skin is not sterile, your skin needs to be as free of germs as possible. You can reduce the number of germs on your skin by washing with CHG  (chlorahexidine gluconate) Soap before surgery.  CHG is an antiseptic cleaner which kills germs and bonds with the skin to continue killing germs even after washing.  Please do not use if you have an allergy to CHG or antibacterial soaps. If your skin becomes reddened/irritated stop using the CHG.  Do not shave (including legs and underarms) for at least 48 hours prior to first CHG shower. It is OK to shave your face.  Please follow these instructions carefully.   1. Shower the NIGHT BEFORE SURGERY and the MORNING OF SURGERY with CHG.   2. If you chose to wash your hair, wash your hair first as usual with your normal shampoo.  3. After you shampoo, rinse your hair and body thoroughly to remove the shampoo.  4. Use CHG as you would any other liquid soap. You can apply CHG directly to the skin and wash gently with a scrungie or a clean washcloth.   5. Apply the CHG Soap to your body ONLY FROM THE NECK DOWN.  Do not use on open wounds or open sores. Avoid contact with your eyes, ears, mouth and genitals (private parts). Wash Face and genitals (private parts)  with your normal soap.  6. Wash thoroughly, paying special attention to the area where your surgery will be performed.  7. Thoroughly rinse your body with warm water from the neck down.  8. DO NOT shower/wash with your normal soap after using and rinsing off the CHG Soap.  9. Pat yourself dry with a CLEAN TOWEL.  10. Wear CLEAN PAJAMAS to bed the night before surgery, wear comfortable clothes the morning of surgery  11. Place CLEAN SHEETS on your bed the night of your first shower and DO NOT SLEEP WITH PETS.    Day of Surgery: Do not apply any deodorants/lotions. Please wear clean clothes to the hospital/surgery center.      Please read over the following fact sheets that you were given.

## 2016-12-03 ENCOUNTER — Encounter (HOSPITAL_COMMUNITY): Payer: Self-pay

## 2016-12-03 ENCOUNTER — Encounter: Payer: Self-pay | Admitting: Physical Therapy

## 2016-12-03 ENCOUNTER — Ambulatory Visit (HOSPITAL_COMMUNITY)
Admission: RE | Admit: 2016-12-03 | Discharge: 2016-12-03 | Disposition: A | Discharge status: Home / Self Care | Payer: Medicare Other | Source: Ambulatory Visit | Attending: Cardiovascular Disease | Admitting: Cardiovascular Disease

## 2016-12-03 ENCOUNTER — Institutional Professional Consult (permissible substitution) (INDEPENDENT_AMBULATORY_CARE_PROVIDER_SITE_OTHER): Payer: Medicare Other | Admitting: Thoracic Surgery (Cardiothoracic Vascular Surgery)

## 2016-12-03 ENCOUNTER — Encounter (HOSPITAL_COMMUNITY)
Admission: RE | Admit: 2016-12-03 | Discharge: 2016-12-03 | Disposition: A | Discharge status: Home / Self Care | Payer: Medicare Other | Source: Ambulatory Visit | Attending: Cardiovascular Disease | Admitting: Cardiovascular Disease

## 2016-12-03 ENCOUNTER — Ambulatory Visit: Payer: Medicare Other | Attending: Physician Assistant | Admitting: Physical Therapy

## 2016-12-03 ENCOUNTER — Encounter: Payer: Self-pay | Admitting: Surgery

## 2016-12-03 ENCOUNTER — Encounter: Payer: Self-pay | Admitting: Thoracic Surgery (Cardiothoracic Vascular Surgery)

## 2016-12-03 VITALS — BP 141/74 | HR 72 | Ht 66.0 in | Wt 165.0 lb

## 2016-12-03 DIAGNOSIS — Z01812 Encounter for preprocedural laboratory examination: Secondary | ICD-10-CM | POA: Insufficient documentation

## 2016-12-03 DIAGNOSIS — R2689 Other abnormalities of gait and mobility: Secondary | ICD-10-CM | POA: Diagnosis present

## 2016-12-03 DIAGNOSIS — I350 Nonrheumatic aortic (valve) stenosis: Secondary | ICD-10-CM

## 2016-12-03 DIAGNOSIS — R293 Abnormal posture: Secondary | ICD-10-CM | POA: Insufficient documentation

## 2016-12-03 DIAGNOSIS — Z01818 Encounter for other preprocedural examination: Secondary | ICD-10-CM | POA: Diagnosis present

## 2016-12-03 DIAGNOSIS — R918 Other nonspecific abnormal finding of lung field: Secondary | ICD-10-CM | POA: Diagnosis not present

## 2016-12-03 DIAGNOSIS — Z01810 Encounter for preprocedural cardiovascular examination: Secondary | ICD-10-CM | POA: Diagnosis present

## 2016-12-03 LAB — COMPREHENSIVE METABOLIC PANEL
ALBUMIN: 3.9 g/dL (ref 3.5–5.0)
ALK PHOS: 79 U/L (ref 38–126)
ALT: 10 U/L — ABNORMAL LOW (ref 14–54)
ANION GAP: 12 (ref 5–15)
AST: 26 U/L (ref 15–41)
BUN: 19 mg/dL (ref 6–20)
CHLORIDE: 104 mmol/L (ref 101–111)
CO2: 23 mmol/L (ref 22–32)
Calcium: 9.2 mg/dL (ref 8.9–10.3)
Creatinine, Ser: 1.03 mg/dL — ABNORMAL HIGH (ref 0.44–1.00)
GFR calc non Af Amer: 48 mL/min — ABNORMAL LOW (ref >60)
GFR, EST AFRICAN AMERICAN: 56 mL/min — AB (ref >60)
GLUCOSE: 122 mg/dL — AB (ref 65–99)
Potassium: 4 mmol/L (ref 3.5–5.1)
SODIUM: 139 mmol/L (ref 135–145)
Total Bilirubin: 0.6 mg/dL (ref 0.3–1.2)
Total Protein: 6.9 g/dL (ref 6.5–8.1)

## 2016-12-03 LAB — URINALYSIS, ROUTINE W REFLEX MICROSCOPIC
BILIRUBIN URINE: NEGATIVE
GLUCOSE, UA: NEGATIVE mg/dL
HGB URINE DIPSTICK: NEGATIVE
Ketones, ur: NEGATIVE mg/dL
NITRITE: NEGATIVE
PH: 5 (ref 5.0–8.0)
Protein, ur: NEGATIVE mg/dL
SPECIFIC GRAVITY, URINE: 1.017 (ref 1.005–1.030)

## 2016-12-03 LAB — CBC
HCT: 39 % (ref 36.0–46.0)
HEMOGLOBIN: 12.9 g/dL (ref 12.0–15.0)
MCH: 29.7 pg (ref 26.0–34.0)
MCHC: 33.1 g/dL (ref 30.0–36.0)
MCV: 89.7 fL (ref 78.0–100.0)
PLATELETS: 182 10*3/uL (ref 150–400)
RBC: 4.35 MIL/uL (ref 3.87–5.11)
RDW: 15.5 % (ref 11.5–15.5)
WBC: 4.8 10*3/uL (ref 4.0–10.5)

## 2016-12-03 LAB — SURGICAL PCR SCREEN
MRSA, PCR: NEGATIVE
Staphylococcus aureus: NEGATIVE

## 2016-12-03 LAB — BLOOD GAS, ARTERIAL
ACID-BASE EXCESS: 2.1 mmol/L — AB (ref 0.0–2.0)
Bicarbonate: 26.4 mmol/L (ref 20.0–28.0)
Drawn by: 449841
FIO2: 21
O2 SAT: 95.3 %
PATIENT TEMPERATURE: 98.6
PCO2 ART: 43.1 mmHg (ref 32.0–48.0)
PO2 ART: 80.3 mmHg — AB (ref 83.0–108.0)
pH, Arterial: 7.404 (ref 7.350–7.450)

## 2016-12-03 LAB — HEMOGLOBIN A1C
Hgb A1c MFr Bld: 5.2 % (ref 4.8–5.6)
MEAN PLASMA GLUCOSE: 102.54 mg/dL

## 2016-12-03 LAB — TYPE AND SCREEN
ABO/RH(D): O POS
Antibody Screen: NEGATIVE

## 2016-12-03 LAB — APTT: APTT: 30 s (ref 24–36)

## 2016-12-03 LAB — PROTIME-INR
INR: 1.04
PROTHROMBIN TIME: 13.5 s (ref 11.4–15.2)

## 2016-12-03 NOTE — Patient Instructions (Signed)
   Continue taking all current medications without change through the day before surgery.  Have nothing to eat or drink after midnight the night before surgery.  On the morning of surgery take only Synthroid with a sip of water.     

## 2016-12-03 NOTE — Progress Notes (Signed)
[facility]  MULTIDISCIPLINARY HEART VALVE CLINIC  CARDIOTHORACIC SURGERY CONSULTATION REPORT  Referring Provider is Becky Margarita, MD PCP is Becky Stains, MD  Chief Complaint  Patient presents with  . TAVR Evaluation #2    HPI:  Patient is an 83 year old recently widowed white female with history of aortic stenosis, hypertension, chronic diastolic congestive heart failure, COPD, hypothyroidism, GE reflux, and hyperlipidemia who has been referred for second surgical opinion to discuss treatment options for management of severe symptomatic aortic stenosis. Patient was initially diagnosed with congestive heart failure several years ago by her primary care physician. She was noted to complain of some exertional shortness breath and lower extremity edema with chronic vertigo. Chest x-ray at that time suggested mild congestive heart failure. Echocardiogram revealed normal left ventricular systolic function with what was felt to be mild to moderate aortic stenosis. She was evaluated by Dr. Radford Mccall lost to follow-up for several years because of patient did not return for appointments.  During this period time the patient's husband became ill and ultimately passed away following a bout with cancer last spring.  She has become progressively less mobile because of pain in her left knee related to degenerative arthritis. She returned to see Dr. Radford Mccall in early September at which time she admitted to symptoms of chronic exertional shortness of breath with occasional chest tightness and dizzy spells. Follow-up echocardiogram revealed severe aortic stenosis with preserved left ventricular systolic function. The patient underwent diagnostic cardiac catheterization 11/15/2016 revealing mild nonobstructive coronary artery disease. CT angiography was performed and the patient was referred for elective surgical consultation. She was evaluated by Dr. Cyndia Mccall and felt the patient would be at least  moderate risk for conventional surgical aortic valve replacement. The patient does have severe atherosclerotic disease and may not have adequate pelvic vascular access for a transfemoral approach for transcatheter aortic valve replacement. A second surgical consultation was requested.  The patient is widowed and lives locally in [place] by herself. She has 2 adult children. Her daughter is very supportive and accompanies her for her office consultation visit today. 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 She has remained functionally independent and drives an automobile. She admits to a long history of symptoms of exertional shortness of breath and chest tightness. She gets tired easily. She sometimes gets dizzy, particularly when she has been working out in her garden. She denies any symptoms of resting shortness of breath, PND, orthopnea, or lower extremity edema. She has occasional tightness across her chest usually associated with activity. She recently had her left knee injected and at present her pain in her knee has been much improved. She is able to ambulate without use of a cane or other mechanical assistance.  She does get some pain in both calf with ambulation, more so on the left than the right.  Past Medical History:  Diagnosis Date  . Abrasion of left lower extremity 12/2012   draining wound on LLL;  . Anemia    a. Hgb 8.4 (2015), previously 13.4 in 2014.  . Arthritis    OA of hands and knees  . CAD in native artery    a.  [facility] 11/15/16 showing normal LVEDP, mild nonobstructive CAD (30% prox RCA, 40% ostial RPDA, 45% D1, 45% dLAD), severe AS with mean gradient 40.74mHg, AVA 0.76cm2.   . Carotid stenosis    1-39% bilateral in 2015  . Cervical spondylolysis   . CKD (chronic  kidney disease), stage III ([place])   . Colon polyp   . COPD (chronic obstructive pulmonary disease) (HCC)    Spiriva inhaler daily  . DDD (degenerative disc  disease), lumbar   . Fibrocystic breast disease   . GERD (gastroesophageal reflux disease)   . Hard of hearing    wears hearing aides both ears  . Hyperlipidemia    can't take the meds so takes Fish Oil  . Hypertension   . Hypothyroidism   . IBS (irritable bowel syndrome)   . Intermittent dysphagia    for pills and solids  . OSA (obstructive sleep apnea)   . Osteopenia   . Severe aortic stenosis   . Squamous cell skin cancer   . TIA (transient ischemic attack)   . Vitamin D deficiency     Past Surgical History:  Procedure Laterality Date  . ABDOMINAL HYSTERECTOMY    . BACK SURGERY  2009  . CATARACT EXTRACTION W/ INTRAOCULAR LENS  IMPLANT, BILATERAL  1999,2000  . EYE SURGERY    . HEMORROIDECTOMY    . LUMBAR LAMINECTOMY  1999  . RIGHT/LEFT HEART CATH AND CORONARY ANGIOGRAPHY N/A 11/15/2016   Procedure: RIGHT/LEFT HEART CATH AND CORONARY ANGIOGRAPHY;  Surgeon: Becky Man, MD;  Location: [place] [facility];  Service: Cardiovascular;  Laterality: N/A;    Family History  Problem Relation Age of Onset  . Cancer - Lung Mother   . Heart attack Father   . Cancer - Lung Brother   . Cancer - Lung Maternal Aunt     Social History   Social History  . Marital status: Widowed    Spouse name: irvin  . Number of children: 2  . Years of education: 10th   Occupational History  . retired    Social History Main Topics  . Smoking status: Former Smoker    Packs/day: 3.00    Years: 40.00    Types: Cigarettes  . Smokeless tobacco: Never Used     Comment: quit in June 11,1999  . Alcohol use 3.5 oz/week    7 Standard drinks or equivalent per week  . Drug use: No  . Sexual activity: No   Other Topics Concern  . Not on file   Social History Narrative   Patient lives at home with her husband   Patient drinks coffee, sodas   Patient has 2 children    Patient is right handed    Patient is retired          Current Outpatient Prescriptions  Medication Sig Dispense  Refill  . amLODipine (NORVASC) 2.5 MG tablet Take 1 tablet (2.5 mg total) by mouth daily. 90 tablet 1  . aspirin EC 81 MG tablet Take 1 tablet (81 mg total) by mouth daily. 90 tablet 3  . Calcium Carb-Cholecalciferol (CALCIUM 1000 + D PO) Take 1 tablet by mouth daily.     . Cholecalciferol (VITAMIN D) 2000 UNITS tablet Take 6,000 Units by mouth daily.     . Cinnamon 500 MG capsule Take 500 mg by mouth 2 (two) times daily.     Marland Kitchen doxylamine, Sleep, (SLEEP AID) 25 MG tablet Take 50 mg by mouth at bedtime.    Marland Kitchen levothyroxine (SYNTHROID, LEVOTHROID) 137 MCG tablet Take 137 mcg by mouth daily before breakfast.    . Magnesium 500 MG TABS Take 1,000 mg by mouth at bedtime.     . meclizine (ANTIVERT) 25 MG tablet Take 12.5-25 mg by mouth every 6 (six) hours as needed  for dizziness.    . Multiple Vitamin (MULTIVITAMIN WITH MINERALS) TABS tablet Take 1 tablet by mouth daily.    Marland Kitchen omega-3 acid ethyl esters (LOVAZA) 1 g capsule Take 1 g by mouth 3 (three) times daily.    . traMADol (ULTRAM) 50 MG tablet Take 1 tablet (50 mg total) by mouth every 8 (eight) hours as needed (pain not relieved by tylenol). 45 tablet 0  . Turmeric 500 MG CAPS Take 500 mg by mouth 2 (two) times daily.    . vitamin B-12 (CYANOCOBALAMIN) 1000 MCG tablet Take 1,000 mcg by mouth daily.    . vitamin C (ASCORBIC ACID) 500 MG tablet Take 500 mg by mouth daily.    . vitamin E 400 UNIT capsule Take 400 Units by mouth 2 (two) times daily.      No current facility-administered medications for this visit.     Allergies  Allergen Reactions  . Lisinopril Cough  . Relafen [Nabumetone] Other (See Comments)    Unsure of exact reaction.  . Statins     Muscle pain  . Tape     Developed blisters from clear tape used over cath site 10/2016  . Welchol [Colesevelam] Other (See Comments)    Muscle pain  . Zetia [Ezetimibe] Other (See Comments)    Muscle pain.  . Neosporin [Neomycin-Bacitracin Zn-Polymyx] Other (See Comments)    Cause  wound/scratch to worsen  . Zinc Oxide Rash      Review of Systems:   General:  normal appetite, decreased energy, no weight gain, no weight loss, no fever  Cardiac:  + chest pain with exertion, no chest pain at rest, + SOB with exertion, no resting SOB, no PND, no orthopnea, + palpitations, no arrhythmia, no atrial fibrillation, no LE edema, + dizzy spells, no syncope  Respiratory:  + shortness of breath, no home oxygen, no productive cough, no dry cough, no bronchitis, no wheezing, no hemoptysis, no asthma, no pain with inspiration or cough, no sleep apnea, no CPAP at night  GI:   no difficulty swallowing, + reflux, no frequent heartburn, no hiatal hernia, no abdominal pain, no constipation, no diarrhea, no hematochezia, no hematemesis, no melena  GU:   no dysuria,  + frequency, no urinary tract infection, no hematuria, no kidney stones, no kidney disease  Vascular:  + pain suggestive of claudication, no pain in feet, no leg cramps, no varicose veins, no DVT, no non-healing foot ulcer  Neuro:   no stroke, no TIA's, no seizures, no headaches, no temporary blindness one eye,  no slurred speech, + peripheral neuropathy, no chronic pain, no instability of gait, no memory/cognitive dysfunction  Musculoskeletal: + arthritis, + joint swelling, no myalgias, + difficulty walking, moderately reduced mobility   Skin:   no rash, no itching, no skin infections, no pressure sores or ulcerations  Psych:   + anxiety, no depression, no nervousness, no unusual recent stress  Eyes:   no blurry vision, no floaters, no recent vision changes, + wears glasses or contacts  ENT:   + hearing loss, no loose or painful teeth, + full dentures  Hematologic:  + easy bruising, no abnormal bleeding, no clotting disorder, no frequent epistaxis  Endocrine:  no diabetes, does not check CBG's at home        Physical Exam:   BP (!) 141/74   Pulse 72   Ht '5\' 6"'$  (1.676 m)   Wt 165 lb (74.8 kg)   LMP  (LMP Unknown)   SpO2  96%   BMI 26.63 kg/m   General:  Elderly but o/w well-appearing  HEENT:  Unremarkable   Neck:   no JVD, no bruits, no adenopathy   Chest:   clear to auscultation, symmetrical breath sounds, no wheezes, no rhonchi   CV:   RRR, grade III/VI crescendo/decrescendo murmur heard best at RSB,  no diastolic murmur  Abdomen:  soft, non-tender, no masses   Extremities:  warm, well-perfused, pulses diminished - not palpable left groin, no LE edema  Rectal/GU  Deferred  Neuro:   Grossly non-focal and symmetrical throughout  Skin:   Clean and dry, no rashes, no breakdown   Diagnostic Tests:  Transthoracic Echocardiography  Patient:    Khiana, Camino MR #:       [id number] Study Date: 11/01/2016 Gender:     F Age:        19 Height:     167.6 cm Weight:     74.8 kg BSA:        1.88 m^2 Pt. Status: Room:   ORDERING     Fransico Him, MD  REFERRING    Fransico Him, MD  SONOGRAPHER  Marygrace Drought, RCS  PERFORMING   [facility], Outpatient  ATTENDING    Skeet Latch, MD  cc:  -------------------------------------------------------------------  ------------------------------------------------------------------- Indications:      Aortic Stenosis (I35.0).  ------------------------------------------------------------------- History:   PMH:  HLD.  Dyspnea.  Chronic obstructive pulmonary disease.  Risk factors:  Hypertension.  ------------------------------------------------------------------- Study Conclusions  - Left ventricle: The cavity size was normal. There was moderate   concentric hypertrophy. Systolic function was normal. Wall motion   was normal; there were no regional wall motion abnormalities.   Doppler parameters are consistent with abnormal left ventricular   relaxation (grade 1 diastolic dysfunction). Doppler parameters   are consistent with high ventricular filling pressure. - Aortic valve: Valve mobility was restricted. There was severe   stenosis. Peak  velocity (S): 407 cm/s. Mean gradient (S): 39 mm   Hg. Valve area (VTI): 0.66 cm^2. Valve area (Vmax): 0.72 cm^2.   Valve area (Vmean): 0.63 cm^2. - Mitral valve: Calcified annulus. Mildly thickened, mildly   calcified leaflets . Transvalvular velocity was within the normal   range. There was no evidence for stenosis. There was mild   regurgitation. Valve area by continuity equation (using LVOT   flow): 1.98 cm^2. - Left atrium: The atrium was severely dilated. - Right ventricle: The cavity size was normal. Wall thickness was   normal. Systolic function was normal. - Tricuspid valve: There was mild regurgitation. - Pulmonary arteries: Systolic pressure was within the normal   range. PA peak pressure: 15 mm Hg (S).  ------------------------------------------------------------------- Study data:  Comparison was made to the study of 10/08/2013.  Study status:  Routine.  Procedure:  The patient reported no pain pre or post test. Transthoracic echocardiography. Image quality was adequate.          Transthoracic echocardiography.  M-mode, complete 2D, spectral Doppler, and color Doppler.  Birthdate: Patient birthdate: [birth year].  Age:  Patient is 83 yr old.  Sex: Gender: female.    BMI: 26.6 kg/m^2.  Patient status:  Outpatient. Study date:  Study date: 11/01/2016. Study time: 09:57 AM. Location:  [facility]  -------------------------------------------------------------------  ------------------------------------------------------------------- Left ventricle:  The cavity size was normal. There was moderate concentric hypertrophy. Systolic function was normal. Wall motion was normal; there were no regional wall motion abnormalities. Doppler parameters are consistent with abnormal left ventricular relaxation (grade  1 diastolic dysfunction). Doppler parameters are consistent with high ventricular filling  pressure.  ------------------------------------------------------------------- Aortic valve:   Trileaflet; moderately thickened, moderately calcified leaflets. Valve mobility was restricted.  Doppler: There was severe stenosis.   There was no regurgitation.    VTI ratio of LVOT to aortic valve: 0.16. Valve area (VTI): 0.66 cm^2. Indexed valve area (VTI): 0.35 cm^2/m^2. Peak velocity ratio of LVOT to aortic valve: 0.17. Valve area (Vmax): 0.72 cm^2. Indexed valve area (Vmax): 0.38 cm^2/m^2. Mean velocity ratio of LVOT to aortic valve: 0.15. Valve area (Vmean): 0.63 cm^2. Indexed valve area (Vmean): 0.33 cm^2/m^2.    Mean gradient (S): 39 mm Hg. Peak gradient (S): 66 mm Hg.  ------------------------------------------------------------------- Aorta:  Aortic root: The aortic root was normal in size.  ------------------------------------------------------------------- Mitral valve:   Calcified annulus. Mildly thickened, mildly calcified leaflets . Mobility was not restricted.  Doppler: Transvalvular velocity was within the normal range. There was no evidence for stenosis. There was mild regurgitation.    Valve area by pressure half-time: 3.28 cm^2. Indexed valve area by pressure half-time: 1.74 cm^2/m^2. Valve area by continuity equation (using LVOT flow): 1.98 cm^2. Indexed valve area by continuity equation (using LVOT flow): 1.05 cm^2/m^2.    Mean gradient (D): 3 mm Hg. Peak gradient (D): 4 mm Hg.  ------------------------------------------------------------------- Left atrium:  The atrium was severely dilated.  ------------------------------------------------------------------- Right ventricle:  The cavity size was normal. Wall thickness was normal. Systolic function was normal.  ------------------------------------------------------------------- Pulmonic valve:    Doppler:  Transvalvular velocity was within the normal range. There was no evidence for  stenosis.  ------------------------------------------------------------------- Tricuspid valve:   Structurally normal valve.    Doppler: Transvalvular velocity was within the normal range. There was mild regurgitation.  ------------------------------------------------------------------- Pulmonary artery:   The main pulmonary artery was normal-sized. Systolic pressure was within the normal range.  ------------------------------------------------------------------- Right atrium:  The atrium was normal in size.  ------------------------------------------------------------------- Pericardium:  There was no pericardial effusion.  ------------------------------------------------------------------- Systemic veins: Inferior vena cava: The vessel was normal in size. The respirophasic diameter changes were in the normal range (= 50%), consistent with normal central venous pressure. Diameter: 14 mm.  ------------------------------------------------------------------- Measurements   IVC                                      Value          Reference  ID                                       14    mm       ----------    Left ventricle                           Value          Reference  LV ID, ED, PLAX chordal                  45    mm       43 - 52  LV ID, ES, PLAX chordal                  37    mm       23 - 38  LV fx shortening, PLAX chordal   (L)  18    %        >=29  LV PW thickness, ED                      14    mm       ----------  IVS/LV PW ratio, ED                      0.93           <=1.3  Stroke volume, 2D                        75    ml       ----------  Stroke volume/bsa, 2D                    40    ml/m^2   ----------  LV e&', lateral                           4.79  cm/s     ----------  LV E/e&', lateral                         20.46          ----------  LV e&', medial                            5.66  cm/s     ----------  LV E/e&', medial                           17.31          ----------  LV e&', average                           5.23  cm/s     ----------  LV E/e&', average                         18.76          ----------    Ventricular septum                       Value          Reference  IVS thickness, ED                        13    mm       ----------    LVOT                                     Value          Reference  LVOT ID, S                               23    mm       ----------  LVOT area  4.15  cm^2     ----------  LVOT peak velocity, S                    71    cm/s     ----------  LVOT mean velocity, S                    43.8  cm/s     ----------  LVOT VTI, S                              18.1  cm       ----------    Aortic valve                             Value          Reference  Aortic valve peak velocity, S            407   cm/s     ----------  Aortic valve mean velocity, S            290   cm/s     ----------  Aortic valve VTI, S                      113   cm       ----------  Aortic mean gradient, S                  39    mm Hg    ----------  Aortic peak gradient, S                  66    mm Hg    ----------  VTI ratio, LVOT/AV                       0.16           ----------  Aortic valve area, VTI                   0.66  cm^2     ----------  Aortic valve area/bsa, VTI               0.35  cm^2/m^2 ----------  Velocity ratio, peak, LVOT/AV            0.17           ----------  Aortic valve area, peak velocity         0.72  cm^2     ----------  Aortic valve area/bsa, peak              0.38  cm^2/m^2 ----------  velocity  Velocity ratio, mean, LVOT/AV            0.15           ----------  Aortic valve area, mean velocity         0.63  cm^2     ----------  Aortic valve area/bsa, mean              0.33  cm^2/m^2 ----------  velocity    Aorta                                    Value  Reference  Aortic root ID, ED                       33    mm       ----------    Left atrium                               Value          Reference  LA ID, A-P, ES                           43    mm       ----------  LA ID/bsa, A-P                   (H)     2.28  cm/m^2   <=2.2  LA volume, S                             75    ml       ----------  LA volume/bsa, S                         39.8  ml/m^2   ----------  LA volume, ES, 1-p A4C                   79.8  ml       ----------  LA volume/bsa, ES, 1-p A4C               42.4  ml/m^2   ----------  LA volume, ES, 1-p A2C                   69.4  ml       ----------  LA volume/bsa, ES, 1-p A2C               36.9  ml/m^2   ----------    Mitral valve                             Value          Reference  Mitral E-wave peak velocity              98    cm/s     ----------  Mitral A-wave peak velocity              129   cm/s     ----------  Mitral mean velocity, D                  74.8  cm/s     ----------  Mitral deceleration time                 180   ms       150 - 230  Mitral pressure half-time                81    ms       ----------  Mitral mean gradient, D                  3     mm Hg    ----------  Mitral peak gradient, D  4     mm Hg    ----------  Mitral E/A ratio, peak                   0.8            ----------  Mitral valve area, PHT, DP               3.28  cm^2     ----------  Mitral valve area/bsa, PHT, DP           1.74  cm^2/m^2 ----------  Mitral valve area, LVOT                  1.98  cm^2     ----------  continuity  Mitral valve area/bsa, LVOT              1.05  cm^2/m^2 ----------  continuity  Mitral annulus VTI, D                    37.9  cm       ----------    Pulmonary arteries                       Value          Reference  PA pressure, S, DP                       15    mm Hg    <=30    Tricuspid valve                          Value          Reference  Tricuspid regurg peak velocity           170   cm/s     ----------  Tricuspid peak RV-RA gradient            12    mm Hg    ----------  Tricuspid maximal  regurg                 170   cm/s     ----------  velocity, PISA    Right atrium                             Value          Reference  RA ID, S-I, ES, A4C              (H)     52    mm       34 - 49  RA area, ES, A4C                         18.3  cm^2     8.3 - 19.5  RA volume, ES, A/L                       53.5  ml       ----------  RA volume/bsa, ES, A/L                   28.4  ml/m^2   ----------    Systemic veins  Value          Reference  Estimated CVP                            3     mm Hg    ----------    Right ventricle                          Value          Reference  TAPSE                                    25.7  mm       ----------  RV pressure, S, DP                       15    mm Hg    <=30  RV s&', lateral, S                        13.2  cm/s     ----------  Legend: (L)  and  (H)  mark values outside specified reference range.  ------------------------------------------------------------------- Prepared and Electronically Authenticated by  Skeet Latch, MD 2018-09-10T13:22:39   RIGHT/LEFT HEART CATH AND CORONARY ANGIOGRAPHY  Conclusion     LV end diastolic pressure is normal.  Prox RCA lesion, 30 %stenosed. Ost RPDA lesion, 40 %stenosed.  1st Diag lesion, 45 %stenosed. Dist LAD lesion, 45 %stenosed.  There is severe aortic valve stenosis. Mean Gradient 40.5 mmHg, AVA ~0.76 cm   Severe aortic stenosis by mean gradient. Mild nonobstructive CAD.  Plan:  The patient will be return to short stay holding area for monitoring and TR band removal once the brachial sheath has been pulled.  She'll be discharged from short stay to follow-up with Dr. Danice Goltz, M.D., M.S. Interventional Cardiologist   Pager # [phone] Phone # [phone] [address] [place], [place] [address]    Indications   Severe aortic stenosis by prior echocardiography [I35.0 (ICD-10-CM)]  SOB (shortness of  breath) [R06.02 (ICD-10-CM)]  Procedural Details/Technique   Technical Details PCP: Becky Stains, MD  Cardiologist: Dr. Radford Mccall Referring Provider: Melina Copa, PA-C  Becky Mccall is a 83 y.o. female with history of COPD, AS, carotid artery disease (1-39% 04/2013), hard of hearing, hyperlipidemia, TIA, HTN, hypothyroidism, GERD, ?CKD (updated stage unknown), significant anemia 04/2013 who recently presented back to Dr. Radford Mccall to re-establish care. She was previously seen 4 years ago but lost to follow-up. She was recently referred back to Dr. Radford Mccall for evaluation of pre-syncope and DOE. She was also planning to have knee replacement surgery sometime this fall and her PCP wanted her AV reevaluated. She reported labile BP, sometimes normal, high, or dropping into the 90s. She was asked to use compression hose. 2D echo 11/01/16 showed moderate LVH, normal LV function, grade 1 diastolic dysfunction, high ventricular filling pressure, severe aortic stenosis, mild mitral regurgitation, severely dilated left atrium, mild TR. Dr. Radford Mccall recommended she return for follow-up visit to arrange and discuss right and left heart cath (R&LHC) last week. She now presents for [facility].  Time Out: Verified patient identification, verified procedure, site/side was marked, verified correct patient position, special equipment/implants available, medications/allergies/relevent history reviewed, required imaging and test results available. Performed.  Access:  Right Radial Artery: 6 Fr sheath -- Seldinger technique using Angiocath Micropuncture Kit -- 10 mL radial cocktail IA; 4000 Units IV Heparin * Right Brachial/Antecubital Vein: unable to place IV with Ultrasound Access - converted to Femoral * Right Common Femoral Vein: 7 Fr Sheath - Seldinger Technique..  Right Heart Catheterization: 7 Fr Gordy Councilman catheter advanced under fluoroscopy with balloon inflated to the RA, RV, then PCWP-PA for hemodynamic measurement.   * Simultaneous FA & PA blood gases checked for SaO2% to calculate FICK CO/CI  * Thermodilution Injections performed to calculate CO/CI  * Catheter removed completely out of the body with balloon deflated.  Left Heart Catheterization: 5 Fr Catheters advanced or exchanged over a J-wire under direct fluoroscopic guidance into the ascending aorta; TIG 4.0 catheter advanced first.  * LV Hemodynamics (LV Gram): AL-1 (using straight wire) * Left Coronary Artery Cineangiography: JL3.5 Catheter  * Right Coronary Artery Cineangiography: TIG 4.0 Catheter   Upon completion of Angiogaphy, the catheter was removed completely out of the body over a wire, without complication.  Femoral Sheath(s) removed in the PACU Holding Area with manual pressure for hemostasis.   Radial sheath removed in the Cardiac Catheterization lab with TR Band placed for hemostasis.  TR Band: 1345 Hours; 13 mL air  MEDICATIONS * SQ Lidocaine 92m; 15 mL for Femoral Venous Access. * Radial Cocktail: 3 mg Verapmil in 10 mL NS * Isovue Contrast: 90 mL * Heparin: 4000 Units  Fluoro time: 8.3 minutes. Dose Area Product: [id number]. Cumulative Air Kerma: 306.7 mGy.   Estimated blood loss <50 mL.  During this procedure the patient was administered the following to achieve and maintain moderate conscious sedation: Versed 1 mg, Dilaudid 25 mg, while the patient's heart rate, blood pressure, and oxygen saturation were continuously monitored. The period of conscious sedation was 64 minutes, of which I was present face-to-face 100% of this time.    Complications   Complications documented before study signed (11/15/2016 2:07 PM EDT)    No complications were associated with this study.  Documented by HLeonie Man MD - 11/15/2016 1:56 PM EDT    Coronary Findings   Dominance: Co-dominant  Left Main  Vessel was injected. Vessel is large. Vessel is angiographically normal.  Left Anterior Descending  Vessel was injected.  Vessel is normal in caliber. Vessel is angiographically normal.  Dist LAD lesion, 45% stenosed. The lesion is tubular, eccentric and irregular.  First Diagonal Branch  Vessel was injected. Vessel is moderate in size. Vessel is angiographically normal.  1st Diag lesion, 45% stenosed. The lesion is tubular, concentric and smooth.  Lateral First Diagonal Branch  Vessel was injected. Vessel is small in size. Vessel is angiographically normal.  First Septal Branch  Vessel was injected. Vessel is small in size. Vessel is angiographically normal.  Second Septal Branch  Vessel was injected. Vessel is small in size. Vessel is angiographically normal.  Third Diagonal Branch  Vessel was injected. Vessel is small in size. Vessel is angiographically normal.  Third Septal Branch  Vessel was injected. Vessel is small in size. Vessel is angiographically normal.  Ramus Intermedius  Vessel was injected. Vessel is normal in caliber. Vessel is angiographically normal.  Lateral Ramus Intermedius  Vessel was injected. Vessel is moderate in size. Vessel is angiographically normal.  Left Circumflex  Vessel was injected. Vessel is normal in caliber. Vessel is angiographically normal.  First Obtuse Marginal Branch  Vessel was injected. Vessel is normal in size. Vessel is  angiographically normal.  Lateral First Obtuse Marginal Branch  Vessel was injected. Vessel is moderate in size. Vessel is angiographically normal.  Third Obtuse Marginal Branch  Vessel was injected. Vessel is moderate in size. Vessel is angiographically normal.  Lateral Third Obtuse Marginal Branch  Vessel was injected. Vessel is small in size. Vessel is angiographically normal.  Right Coronary Artery  Vessel was injected. Vessel is large. Vessel is angiographically normal.  Prox RCA lesion, 30% stenosed. The lesion is concentric and irregular.  Acute Marginal Branch  Vessel was injected. Vessel is small in size. Vessel is angiographically  normal.  Right Posterior Descending Artery  Vessel was injected. Vessel is moderate in size. Vessel is angiographically normal.  Ost RPDA lesion, 40% stenosed. The lesion is tubular.  Inferior Septal  Vessel was injected. Vessel is small in size. Vessel is angiographically normal.  Right Heart   Right Heart Pressures PAP/mean: 28/7/14 mmHg PCWP: 3-5 mmHg LV EDP is normal. LVP/EDP: 220/4/13 mmHg AoP/MAP: 167/62/106 mmHg AO sat% 98%; PA sat% 79% Cardiac Output/Index: Thermodilution-6.41/3.48; Fick 7.63/4.15    Right Atrium Right atrial pressure is decreased. 1-2 mmHg    Right Ventricle RVP/EDP: 30/0/2 mmHg    Wall Motion   Not assessed.        Left Heart   Aortic Valve There is severe aortic valve stenosis. P-P Gradient: 48-52 mmHg Mean Gradient: 40.5 mmHg AVA ~0.76 cm; AVA Index 0.41 cm/BSA    Coronary Diagrams   Diagnostic Diagram       Implants     No implant documentation for this case.  MERGE Images   Show images for CARDIAC CATHETERIZATION   Link to Procedure Log   Procedure Log    Hemo Data    Most Recent Value  Fick Cardiac Output 7.63 L/min  Fick Cardiac Output Index 4.15 (L/min)/BSA  Thermal Cardiac Output 6.41 L/min  Thermal Cardiac Output Index 3.48 (L/min)/BSA  Aortic Mean Gradient 40.5 mmHg  Aortic Peak Gradient 48 mmHg  Aortic Valve Area 0.76  Aortic Value Area Index 0.41 cm2/BSA  RA A Wave 2 mmHg  RA V Wave 1 mmHg  RA Mean 0 mmHg  RV Systolic Pressure 30 mmHg  RV Diastolic Pressure -1 mmHg  RV EDP 2 mmHg  PA Systolic Pressure 28 mmHg  PA Diastolic Pressure 7 mmHg  PA Mean 14 mmHg  PW A Wave 7 mmHg  PW V Wave 5 mmHg  PW Mean 3 mmHg  AO Systolic Pressure 671 mmHg  AO Diastolic Pressure 67 mmHg  AO Mean 245 mmHg  LV Systolic Pressure 809 mmHg  LV Diastolic Pressure 4 mmHg  LV EDP 13 mmHg  Arterial Occlusion Pressure Extended Systolic Pressure 983 mmHg  Arterial Occlusion Pressure Extended Diastolic Pressure 62 mmHg  Arterial  Occlusion Pressure Extended Mean Pressure 106 mmHg  Left Ventricular Apex Extended Systolic Pressure 382 mmHg  Left Ventricular Apex Extended Diastolic Pressure 0 mmHg  Left Ventricular Apex Extended EDP Pressure 13 mmHg  TPVR Index 4.01 HRUI  TSVR Index 32.15 HRUI  TPVR/TSVR Ratio 0.13    Cardiac TAVR CT  TECHNIQUE: The patient was scanned on a Siemens [id number] scanner. A 120 kV retrospective scan was triggered in the ascending thoracic aorta at 140 HU's. Gantry rotation speed was 250 msecs and collimation was .6 mm. No beta blockade or nitro were given. The 3D data set was reconstructed in 5% intervals of the R-R cycle. Systolic and diastolic phases were analyzed on a dedicated work station using MPR, MIP  and VRT modes. The patient received 80 cc of contrast.  FINDINGS: Aortic Valve: Tri leaflet and calcified with restricted leaflet motion  Aorta: Marked mixed and calcific atherosclerosis of the entire aortic root, arch and descending thoracic aorta normal arch vessel origins  Sinotubular Junction:  26 mm  Ascending Thoracic Aorta:  30 mm  Aortic Arch:  27 mm  Descending Thoracic Aorta:  27 mm  Sinus of Valsalva Measurements:  Non-coronary:  31 mm  Right - coronary:  29.5 mm  Left - coronary:  30 mm  Coronary Artery Height above Annulus:  Left Main:  12.4 mm  Right Coronary:  14.6 mm  Virtual Basal Annulus Measurements:  Maximum/Minimum Diameter:  28.3 mm x 22.2 mm  Perimeter:  81.4 mm  Area:  491.4 mm2  Coronary Arteries:  Sufficient  height above annulus for deployment  Optimum Fluoroscopic Angle for Delivery: LAO 15 degrees Caudal 6 degrees  IMPRESSION: 1) Calcified tri leaflet aortic valve with annular area 491 mm2 and perimeter 81.4 mm suitable for a 26 mm Sapien 3 valve or a 29 mm Evolut Pro valve  2) Optimum angle for deployment LAO 15 degrees Caudal 6 degrees  3) Coronary arteries sufficient height above annulus for  deployment  4) No LAA thrombus  5) STJ calcified around approximately 30% of its circumference  6) Fairly marked calcific atherosclerosis of the entire aortic root, arch and descending thoracic aorta  Jenkins Rouge   Electronically Signed   By: Jenkins Rouge M.D.   On: 11/24/2016 11:25    STS Risk Calculator Procedure: AV Replacement  Risk of Mortality: 5.698%  Morbidity or Mortality: 22.898%  Long Length of Stay: 12.005%  Short Length of Stay: 17.377%  Permanent Stroke: 2.76%  Prolonged Ventilation: 14.761%  DSW Infection: 0.292%  Renal Failure: 5.471%  Reoperation: 9.122%       Impression:  Patient has stage D severe symptomatic aortic stenosis. She describes a long history of slowly progressive symptoms of exertional shortness of breath, chest tightness, and fatigue consistent with chronic diastolic congestive heart failure, [place] Heart Association functional class IIb.  She has also been having some symptoms of dizziness without syncope. I have personally reviewed the patient's recent transthoracic echocardiogram, diagnostic cardiac catheterization, and CT angiograms. Echocardiogram performed 11/01/2016 reveals severe aortic stenosis with preserved left ventricular systolic function. Aortic valve is trileaflet with severe thickening, calcification, and restricted leaflet mobility involving all 3 leaflets. The peak velocity across aortic valve measured nearly 4.1 m/s corresponding to mean transvalvular gradient estimated 39 mmHg. The DVI was reported 0.17.  Left and right heart catheterization performed 11/15/2016 revealed mild nonobstructive coronary artery disease and confirms the presence of severe aortic stenosis. Mean transvalvular gradient measured at catheterization was 40.5 mmHg corresponding to aortic valve area calculated 0.76 cm.  Right-sided pressures were normal.  I agree the patient needs aortic valve replacement. Risks associated with conventional  surgery would be at least moderately elevated because of the patient's advanced age and somewhat limited mobility. Furthermore, risks may be considerably higher than that predicted using the STS risk calculator due to the presence of significant calcification in the aortic root and proximal ascending thoracic aorta.  Cardiac-gated CTA of the heart reveals anatomical characteristics consistent with aortic stenosis suitable for treatment by transcatheter aortic valve replacement without any significant complicating features other than some nearly circumferential calcification at the sino tubular junction.  CTA of the aorta and iliac vessels demonstrate what appears to be severe aortoiliac occlusive disease inadequate  pelvic vascular access to facilitate a transfemoral approach.  Either the left subclavian or left carotid arteries appear to be adequate for alternative access.    Plan:  The patient and her daughter were counseled at length regarding treatment alternatives for management of severe symptomatic aortic stenosis. Alternative approaches such as conventional aortic valve replacement, transcatheter aortic valve replacement, and palliative medical therapy were compared and contrasted at length.  The risks associated with conventional surgical aortic valve replacement were been discussed in detail, as were expectations for post-operative convalescence, and why I would be reluctant to consider this patient a candidate for conventional surgery.  Issues specific to transcatheter aortic valve replacement were discussed including questions about long term valve durability, the potential for paravalvular leak, possible increased risk of need for permanent pacemaker placement, and other technical complications related to the procedure itself.  Long-term prognosis with medical therapy was discussed. This discussion was placed in the context of the patient's own specific clinical presentation and past medical  history.  Potential for problems with vascular access was discussed including the risks and benefits of both trans-carotid and transit subclavian approach.  All of their questions been addressed.  Following the decision to proceed with transcatheter aortic valve replacement, a discussion has been held regarding what types of management strategies would be attempted intraoperatively in the event of life-threatening complications, including whether or not the patient would be considered a candidate for the use of cardiopulmonary bypass and/or conversion to open sternotomy for attempted surgical intervention.  The patient has been advised of a variety of complications that might develop including but not limited to risks of death, stroke, paravalvular leak, aortic dissection or other major vascular complications, aortic annulus rupture, device embolization, cardiac rupture or perforation, mitral regurgitation, acute myocardial infarction, arrhythmia, heart block or bradycardia requiring permanent pacemaker placement, congestive heart failure, respiratory failure, renal failure, pneumonia, infection, other late complications related to structural valve deterioration or migration, or other complications that might ultimately cause a temporary or permanent loss of functional independence or other long term morbidity.  The patient provides full informed consent for the procedure as described and all questions were answered.   I spent in excess of 90 minutes during the conduct of this office consultation and >50% of this time involved direct face-to-face encounter with the patient for counseling and/or coordination of their care.     Valentina Gu. Roxy Manns, MD 12/03/2016 11:20 AM

## 2016-12-03 NOTE — Progress Notes (Signed)
PCP - [name] Cardiologist - Gonvick turner  Chest x-ray - 12/03/16 EKG - 12/03/16 Stress Test - 2014 ECHO - 10/2016 Cardiac Cath - 10/2016  Sleep Study - 2015 CPAP - test was negative according to patient     Patient denies shortness of breath, fever, cough and chest pain at PAT appointment   Patient verbalized understanding of instructions that were given to them at the PAT appointment. Patient was also instructed that they will need to review over the PAT instructions again at home before surgery.

## 2016-12-03 NOTE — Progress Notes (Signed)
[facility]  MULTIDISCIPLINARY HEART VALVE CLINIC  CARDIOTHORACIC SURGERY CONSULTATION REPORT  Referring Provider is [name], MD PCP is [name], MD  Chief Complaint  Patient presents with  . New Patient (Initial Visit)  . TAVR    HPI:  The patient is an 83 year old woman with a history of hypertension, hypothyroidism, COPD, stage III CKD and aortic stenosis who had an echo in 04/2013 showing mild  AS with a mean gradient of 13 mm Hg and a peak of 30 mm Hg with a DI of 0.36. LVEF was normal. A follow up echo in 09/2013 showed a mean gradient of 16 mm Hg across the aortic valve which was severely thickened and calcified. Marland KitchenShe was lost to follow up but recently went back to Dr. [name] for evaluation of exertional shortness of breath and some dizziness. She also has significant DJD of her knees and was planning to have her left knee replaced in the near future. Her most recent echo on 11/01/2016 showed an increase in the mean gradient to 39 mm Hg with a peak of 66 mm Hg. The DI was 0.17. LV systolic function was normal with moderate concentric LVH and grade 1 diastolic dysfunction. She underwent cath on 11/15/2016 showing mild non-obstructive CAD. The mean AV gradient was 40.5 mm Hg with an AVA of 0.76 cm2.  She lives alone and is completely independent. Her daughter is with her today and lives near her. The patient reports shortness of breath and fatigue with walking longer distances but does fine in her house. If she goes to the mailbox or takes out her trash cans she gets very short of breath and has to stop. She still rides her lawn mower and takes care of her house. She has had some chest pressure with exertion and some dizziness particularly when standing up or bending over. She has chronic problems with vertigo though. She has never had syncope and denies any orthopnea or edema.  Past Medical History:  Diagnosis Date  . Abrasion of left lower extremity 12/2012   draining wound on LLL;  . Anemia    a. Hgb 8.4 (2015), previously 13.4 in 2014.  . Arthritis    OA of hands and knees  . CAD in native artery    a.  [facility] 11/15/16 showing normal LVEDP, mild nonobstructive CAD (30% prox RCA, 40% ostial RPDA, 45% D1, 45% dLAD), severe AS with mean gradient 40.73mHg, AVA 0.76cm2.   . Carotid stenosis    1-39% bilateral in 2015  . Cervical spondylolysis   . CKD (chronic kidney disease), stage III ([place]   . Colon polyp   . COPD (chronic obstructive pulmonary disease) (HCC)    Spiriva inhaler daily  . DDD (degenerative disc disease), lumbar   . Fibrocystic breast disease   . GERD (gastroesophageal reflux disease)   . Hard of hearing    wears hearing aides both ears  . Hyperlipidemia    can't take the meds so takes Fish Oil  . Hypertension   . Hypothyroidism   . IBS (irritable bowel syndrome)   . Intermittent dysphagia    for pills and solids  . OSA (obstructive sleep apnea)   . Osteopenia   . Severe aortic stenosis   . Squamous cell skin cancer   . TIA (transient ischemic attack)   . Vitamin D deficiency     Past Surgical History:  Procedure Laterality Date  . ABDOMINAL HYSTERECTOMY    . BACK  SURGERY  2009  . CATARACT EXTRACTION W/ INTRAOCULAR LENS  IMPLANT, BILATERAL  1999,2000  . EYE SURGERY    . HEMORROIDECTOMY    . LUMBAR LAMINECTOMY  1999  . RIGHT/LEFT HEART CATH AND CORONARY ANGIOGRAPHY N/A 11/15/2016   Procedure: RIGHT/LEFT HEART CATH AND CORONARY ANGIOGRAPHY;  Surgeon: [name], MD;  Location: [facility];  Service: Cardiovascular;  Laterality: N/A;    Family History  Problem Relation Age of Onset  . Cancer - Lung Mother   . Heart attack Father   . Cancer - Lung Brother   . Cancer - Lung Maternal Aunt     Social History   Social History  . Marital status: Widowed    Spouse name: irvin  . Number of children: 2  . Years of education: 10th   Occupational History  . retired    Social History Main Topics  .  Smoking status: Former Smoker    Packs/day: 3.00    Years: 40.00    Types: Cigarettes  . Smokeless tobacco: Never Used     Comment: quit in June 11,1999  . Alcohol use 3.5 oz/week    7 Standard drinks or equivalent per week  . Drug use: No  . Sexual activity: No   Other Topics Concern  . Not on file   Social History Narrative   Patient lives at home with her husband   Patient drinks coffee, sodas   Patient has 2 children    Patient is right handed    Patient is retired          Current Outpatient Prescriptions  Medication Sig Dispense Refill  . amLODipine (NORVASC) 2.5 MG tablet Take 1 tablet (2.5 mg total) by mouth daily. 90 tablet 1  . aspirin EC 81 MG tablet Take 1 tablet (81 mg total) by mouth daily. 90 tablet 3  . Calcium Carb-Cholecalciferol (CALCIUM 1000 + D PO) Take 1 tablet by mouth daily.     . Cholecalciferol (VITAMIN D) 2000 UNITS tablet Take 6,000 Units by mouth daily.     . Cinnamon 500 MG capsule Take 500 mg by mouth 2 (two) times daily.     Marland Kitchen levothyroxine (SYNTHROID, LEVOTHROID) 137 MCG tablet Take 137 mcg by mouth daily before breakfast.    . Magnesium 500 MG TABS Take 1,000 mg by mouth at bedtime.     . meclizine (ANTIVERT) 25 MG tablet Take 12.5-25 mg by mouth every 6 (six) hours as needed for dizziness.    . Multiple Vitamin (MULTIVITAMIN WITH MINERALS) TABS tablet Take 1 tablet by mouth daily.    Marland Kitchen omega-3 acid ethyl esters (LOVAZA) 1 g capsule Take 1 g by mouth 3 (three) times daily.    . traMADol (ULTRAM) 50 MG tablet Take 1 tablet (50 mg total) by mouth every 8 (eight) hours as needed (pain not relieved by tylenol). 45 tablet 0  . Turmeric 500 MG CAPS Take 500 mg by mouth 2 (two) times daily.    . vitamin B-12 (CYANOCOBALAMIN) 1000 MCG tablet Take 1,000 mcg by mouth daily.    . vitamin C (ASCORBIC ACID) 500 MG tablet Take 500 mg by mouth daily.    . vitamin E 400 UNIT capsule Take 400 Units by mouth 2 (two) times daily.     Marland Kitchen doxylamine, Sleep, (SLEEP  AID) 25 MG tablet Take 50 mg by mouth at bedtime.     No current facility-administered medications for this visit.     Allergies  Allergen Reactions  . Lisinopril Cough  . Relafen [Nabumetone] Other (See Comments)    Unsure of exact reaction.  . Statins     Muscle pain  . Tape     Developed blisters from clear tape used over cath site 10/2016  . Welchol [Colesevelam] Other (See Comments)    Muscle pain  . Zetia [Ezetimibe] Other (See Comments)    Muscle pain.  . Neosporin [Neomycin-Bacitracin Zn-Polymyx] Other (See Comments)    Cause wound/scratch to worsen  . Zinc Oxide Rash      Review of Systems:   General:  normal appetite, reduced energy, no weight gain, no weight loss, no fever  Cardiac:  Has occasional chest pressure with exertion, no chest pain at rest, has SOB with moderte exertion, no resting SOB, no PND, no orthopnea, no palpitations, no arrhythmia, no atrial fibrillation, no LE edema, occassional dizzy spells, no syncope  Respiratory:  exertional shortness of breath, no home oxygen, no productive cough, no dry cough, no bronchitis, no wheezing, no hemoptysis, no asthma, no pain with inspiration or cough, has sleep apnea, no CPAP at night  GI:   no difficulty swallowing, no reflux, no frequent heartburn, no hiatal hernia, no abdominal pain, no constipation, no diarrhea, no hematochezia, no hematemesis, no melena  GU:   no dysuria,  no frequency, no urinary tract infection, no hematuria,no kidney stones, has some chronic kidney disease  Vascular:  has pain suggestive of claudication worse in the left calf, no pain in feet, exertional leg cramps, no varicose veins, no DVT, no non-healing foot ulcer  Neuro:   no stroke, no TIA's, no seizures, no headaches, no temporary blindness one eye,  no slurred speech, no peripheral neuropathy, no chronic pain, no instability of gait, no memory/cognitive dysfunction  Musculoskeletal: has arthritis, has joint swelling, no myalgias, mild  difficulty walking, slow mobility due to left knee   Skin:   no rash, no itching, no skin infections, no pressure sores or ulcerations  Psych:   no anxiety, no depression, no nervousness, no unusual recent stress  Eyes:   no blurry vision, no floaters, no recent vision changes,  wears glasses   ENT:   has hearing loss and wears hearing aid, edentulous, wears dentures Hematologic:  no easy bruising, no abnormal bleeding, no clotting disorder, no frequent epistaxis  Endocrine:  no diabetes, does not check CBG's at home       Physical Exam:   BP (!) 143/73   Pulse 82   Ht '5\' 6"'$  (1.676 m)   Wt 165 lb (74.8 kg)   LMP  (LMP Unknown)   SpO2 93%   BMI 26.63 kg/m   General:  Elderly but well-appearing  HEENT:  NCAT, PERLA, EOMI, oropharynx clear  Neck:   no JVD, no bruits, no adenopathy or thyromegaly. Bilateral cervical bruits or transmitted murmur  Chest:   clear to auscultation, symmetrical breath sounds, no wheezes, no rhonchi   CV:   RRR, grade III/VI crescendo/decrescendo murmur heard best at RSB,  no diastolic murmur  Abdomen:  soft, non-tender, no masses or organomegaly  Extremities:  warm, well-perfused, pulses not palpable in feet, no LE edema  Rectal/GU  Deferred  Neuro:   Grossly non-focal and symmetrical throughout  Skin:   Clean and dry, no rashes, no breakdown   Diagnostic Tests:     [facility]  [address] [place], [place] [address]                            [phone]  ------------------------------------------------------------------- Transthoracic Echocardiography  Patient:    Becky Mccall, Becky Mccall MR #:       [id number] Study Date: 11/01/2016 Gender:     F Age:        64 Height:     167.6 cm Weight:     74.8 kg BSA:        1.88 m^2 Pt. Status: Room:   ORDERING     Fransico Him, MD  REFERRING    Fransico Him, MD  SONOGRAPHER  Marygrace Drought, RCS  PERFORMING   [facility], Outpatient  ATTENDING     Skeet Latch, MD  cc:  -------------------------------------------------------------------  ------------------------------------------------------------------- Indications:      Aortic Stenosis (I35.0).  ------------------------------------------------------------------- History:   PMH:  HLD.  Dyspnea.  Chronic obstructive pulmonary disease.  Risk factors:  Hypertension.  ------------------------------------------------------------------- Study Conclusions  - Left ventricle: The cavity size was normal. There was moderate   concentric hypertrophy. Systolic function was normal. Wall motion   was normal; there were no regional wall motion abnormalities.   Doppler parameters are consistent with abnormal left ventricular   relaxation (grade 1 diastolic dysfunction). Doppler parameters   are consistent with high ventricular filling pressure. - Aortic valve: Valve mobility was restricted. There was severe   stenosis. Peak velocity (S): 407 cm/s. Mean gradient (S): 39 mm   Hg. Valve area (VTI): 0.66 cm^2. Valve area (Vmax): 0.72 cm^2.   Valve area (Vmean): 0.63 cm^2. - Mitral valve: Calcified annulus. Mildly thickened, mildly   calcified leaflets . Transvalvular velocity was within the normal   range. There was no evidence for stenosis. There was mild   regurgitation. Valve area by continuity equation (using LVOT   flow): 1.98 cm^2. - Left atrium: The atrium was severely dilated. - Right ventricle: The cavity size was normal. Wall thickness was   normal. Systolic function was normal. - Tricuspid valve: There was mild regurgitation. - Pulmonary arteries: Systolic pressure was within the normal   range. PA peak pressure: 15 mm Hg (S).  ------------------------------------------------------------------- Study data:  Comparison was made to the study of 10/08/2013.  Study status:  Routine.  Procedure:  The patient reported no pain pre or post test. Transthoracic  echocardiography. Image quality was adequate.          Transthoracic echocardiography.  M-mode, complete 2D, spectral Doppler, and color Doppler.  Birthdate: Patient birthdate: 15-Dec-1929.  Age:  Patient is 83 yr old.  Sex: Gender: female.    BMI: 26.6 kg/m^2.  Patient status:  Outpatient. Study date:  Study date: 11/01/2016. Study time: 09:57 AM. Location:  [facility]  -------------------------------------------------------------------  ------------------------------------------------------------------- Left ventricle:  The cavity size was normal. There was moderate concentric hypertrophy. Systolic function was normal. Wall motion was normal; there were no regional wall motion abnormalities. Doppler parameters are consistent with abnormal left ventricular relaxation (grade 1 diastolic dysfunction). Doppler parameters are consistent with high ventricular filling pressure.  ------------------------------------------------------------------- Aortic valve:   Trileaflet; moderately thickened, moderately calcified leaflets. Valve mobility was restricted.  Doppler: There was severe stenosis.   There was no regurgitation.    VTI ratio of LVOT to aortic valve: 0.16. Valve area (  VTI): 0.66 cm^2. Indexed valve area (VTI): 0.35 cm^2/m^2. Peak velocity ratio of LVOT to aortic valve: 0.17. Valve area (Vmax): 0.72 cm^2. Indexed valve area (Vmax): 0.38 cm^2/m^2. Mean velocity ratio of LVOT to aortic valve: 0.15. Valve area (Vmean): 0.63 cm^2. Indexed valve area (Vmean): 0.33 cm^2/m^2.    Mean gradient (S): 39 mm Hg. Peak gradient (S): 66 mm Hg.  ------------------------------------------------------------------- Aorta:  Aortic root: The aortic root was normal in size.  ------------------------------------------------------------------- Mitral valve:   Calcified annulus. Mildly thickened, mildly calcified leaflets . Mobility was not restricted.  Doppler: Transvalvular velocity was  within the normal range. There was no evidence for stenosis. There was mild regurgitation.    Valve area by pressure half-time: 3.28 cm^2. Indexed valve area by pressure half-time: 1.74 cm^2/m^2. Valve area by continuity equation (using LVOT flow): 1.98 cm^2. Indexed valve area by continuity equation (using LVOT flow): 1.05 cm^2/m^2.    Mean gradient (D): 3 mm Hg. Peak gradient (D): 4 mm Hg.  ------------------------------------------------------------------- Left atrium:  The atrium was severely dilated.  ------------------------------------------------------------------- Right ventricle:  The cavity size was normal. Wall thickness was normal. Systolic function was normal.  ------------------------------------------------------------------- Pulmonic valve:    Doppler:  Transvalvular velocity was within the normal range. There was no evidence for stenosis.  ------------------------------------------------------------------- Tricuspid valve:   Structurally normal valve.    Doppler: Transvalvular velocity was within the normal range. There was mild regurgitation.  ------------------------------------------------------------------- Pulmonary artery:   The main pulmonary artery was normal-sized. Systolic pressure was within the normal range.  ------------------------------------------------------------------- Right atrium:  The atrium was normal in size.  ------------------------------------------------------------------- Pericardium:  There was no pericardial effusion.  ------------------------------------------------------------------- Systemic veins: Inferior vena cava: The vessel was normal in size. The respirophasic diameter changes were in the normal range (= 50%), consistent with normal central venous pressure. Diameter: 14 mm.  ------------------------------------------------------------------- Measurements   IVC                                      Value           Reference  ID                                       14    mm       ----------    Left ventricle                           Value          Reference  LV ID, ED, PLAX chordal                  45    mm       43 - 52  LV ID, ES, PLAX chordal                  37    mm       23 - 38  LV fx shortening, PLAX chordal   (L)     18    %        >=29  LV PW thickness, ED                      14    mm       ----------  IVS/LV PW ratio, ED                      0.93           <=1.3  Stroke volume, 2D                        75    ml       ----------  Stroke volume/bsa, 2D                    40    ml/m^2   ----------  LV e&', lateral                           4.79  cm/s     ----------  LV E/e&', lateral                         20.46          ----------  LV e&', medial                            5.66  cm/s     ----------  LV E/e&', medial                          17.31          ----------  LV e&', average                           5.23  cm/s     ----------  LV E/e&', average                         18.76          ----------    Ventricular septum                       Value          Reference  IVS thickness, ED                        13    mm       ----------    LVOT                                     Value          Reference  LVOT ID, S                               23    mm       ----------  LVOT area                                4.15  cm^2     ----------  LVOT peak velocity, S                    71    cm/s     ----------  LVOT mean velocity, S  43.8  cm/s     ----------  LVOT VTI, S                              18.1  cm       ----------    Aortic valve                             Value          Reference  Aortic valve peak velocity, S            407   cm/s     ----------  Aortic valve mean velocity, S            290   cm/s     ----------  Aortic valve VTI, S                      113   cm       ----------  Aortic mean gradient, S                  39    mm Hg    ----------   Aortic peak gradient, S                  66    mm Hg    ----------  VTI ratio, LVOT/AV                       0.16           ----------  Aortic valve area, VTI                   0.66  cm^2     ----------  Aortic valve area/bsa, VTI               0.35  cm^2/m^2 ----------  Velocity ratio, peak, LVOT/AV            0.17           ----------  Aortic valve area, peak velocity         0.72  cm^2     ----------  Aortic valve area/bsa, peak              0.38  cm^2/m^2 ----------  velocity  Velocity ratio, mean, LVOT/AV            0.15           ----------  Aortic valve area, mean velocity         0.63  cm^2     ----------  Aortic valve area/bsa, mean              0.33  cm^2/m^2 ----------  velocity    Aorta                                    Value          Reference  Aortic root ID, ED                       33    mm       ----------    Left atrium  Value          Reference  LA ID, A-P, ES                           43    mm       ----------  LA ID/bsa, A-P                   (H)     2.28  cm/m^2   <=2.2  LA volume, S                             75    ml       ----------  LA volume/bsa, S                         39.8  ml/m^2   ----------  LA volume, ES, 1-p A4C                   79.8  ml       ----------  LA volume/bsa, ES, 1-p A4C               42.4  ml/m^2   ----------  LA volume, ES, 1-p A2C                   69.4  ml       ----------  LA volume/bsa, ES, 1-p A2C               36.9  ml/m^2   ----------    Mitral valve                             Value          Reference  Mitral E-wave peak velocity              98    cm/s     ----------  Mitral A-wave peak velocity              129   cm/s     ----------  Mitral mean velocity, D                  74.8  cm/s     ----------  Mitral deceleration time                 180   ms       150 - 230  Mitral pressure half-time                81    ms       ----------  Mitral mean gradient, D                  3     mm Hg     ----------  Mitral peak gradient, D                  4     mm Hg    ----------  Mitral E/A ratio, peak                   0.8            ----------  Mitral valve area, PHT, DP  3.28  cm^2     ----------  Mitral valve area/bsa, PHT, DP           1.74  cm^2/m^2 ----------  Mitral valve area, LVOT                  1.98  cm^2     ----------  continuity  Mitral valve area/bsa, LVOT              1.05  cm^2/m^2 ----------  continuity  Mitral annulus VTI, D                    37.9  cm       ----------    Pulmonary arteries                       Value          Reference  PA pressure, S, DP                       15    mm Hg    <=30    Tricuspid valve                          Value          Reference  Tricuspid regurg peak velocity           170   cm/s     ----------  Tricuspid peak RV-RA gradient            12    mm Hg    ----------  Tricuspid maximal regurg                 170   cm/s     ----------  velocity, PISA    Right atrium                             Value          Reference  RA ID, S-I, ES, A4C              (H)     52    mm       34 - 49  RA area, ES, A4C                         18.3  cm^2     8.3 - 19.5  RA volume, ES, A/L                       53.5  ml       ----------  RA volume/bsa, ES, A/L                   28.4  ml/m^2   ----------    Systemic veins                           Value          Reference  Estimated CVP                            3     mm Hg    ----------    Right ventricle  Value          Reference  TAPSE                                    25.7  mm       ----------  RV pressure, S, DP                       15    mm Hg    <=30  RV s&', lateral, S                        13.2  cm/s     ----------  Legend: (L)  and  (H)  mark values outside specified reference range.  ------------------------------------------------------------------- Prepared and Electronically Authenticated by  Chilton Si,  MD 2018-09-10T13:22:39   Physicians   Panel Physicians Referring Physician Case Authorizing Physician  Marykay Lex, MD (Primary)    Procedures   RIGHT/LEFT HEART CATH AND CORONARY ANGIOGRAPHY  Conclusion     LV end diastolic pressure is normal.  Prox RCA lesion, 30 %stenosed. Ost RPDA lesion, 40 %stenosed.  1st Diag lesion, 45 %stenosed. Dist LAD lesion, 45 %stenosed.  There is severe aortic valve stenosis. Mean Gradient 40.5 mmHg, AVA ~0.76 cm   Severe aortic stenosis by mean gradient. Mild nonobstructive CAD.  Plan:  The patient will be return to short stay holding area for monitoring and TR band removal once the brachial sheath has been pulled.  She'll be discharged from short stay to follow-up with Dr. Lester [place], M.D., M.S. Interventional Cardiologist   Pager # [phone] Phone # [phone] [address] [place], [place] [address]    Indications   Severe aortic stenosis by prior echocardiography [I35.0 (ICD-10-CM)]  SOB (shortness of breath) [R06.02 (ICD-10-CM)]  Procedural Details/Technique   Technical Details PCP: Laurann Montana, MD  Cardiologist: Dr. Mayford Knife Referring Provider: Ronie Spies, PA-C  Becky Mccall is a 83 y.o. female with history of COPD, AS, carotid artery disease (1-39% 04/2013), hard of hearing, hyperlipidemia, TIA, HTN, hypothyroidism, GERD, ?CKD (updated stage unknown), significant anemia 04/2013 who recently presented back to Dr. Mayford Knife to re-establish care. She was previously seen 4 years ago but lost to follow-up. She was recently referred back to Dr. Mayford Knife for evaluation of pre-syncope and DOE. She was also planning to have knee replacement surgery sometime this fall and her PCP wanted her AV reevaluated. She reported labile BP, sometimes normal, high, or dropping into the 90s. She was asked to use compression hose. 2D echo 11/01/16 showed moderate LVH, normal LV function, grade 1 diastolic  dysfunction, high ventricular filling pressure, severe aortic stenosis, mild mitral regurgitation, severely dilated left atrium, mild TR. Dr. Mayford Knife recommended she return for follow-up visit to arrange and discuss right and left heart cath (R&LHC) last week. She now presents for R&LHC.  Time Out: Verified patient identification, verified procedure, site/side was marked, verified correct patient position, special equipment/implants available, medications/allergies/relevent history reviewed, required imaging and test results available. Performed.   Access:  Right Radial Artery: 6 Fr sheath -- Seldinger technique using Angiocath Micropuncture Kit -- 10 mL radial cocktail IA; 4000 Units IV Heparin * Right Brachial/Antecubital Vein: unable to place IV with Ultrasound Access - converted to Femoral * Right Common Femoral Vein: 7 Fr Sheath - Seldinger Technique..  Right Heart Catheterization: 7 Fr  Theone Murdoch catheter advanced under fluoroscopy with balloon inflated to the RA, RV, then PCWP-PA for hemodynamic measurement.  * Simultaneous FA & PA blood gases checked for SaO2% to calculate FICK CO/CI  * Thermodilution Injections performed to calculate CO/CI  * Catheter removed completely out of the body with balloon deflated.  Left Heart Catheterization: 5 Fr Catheters advanced or exchanged over a J-wire under direct fluoroscopic guidance into the ascending aorta; TIG 4.0 catheter advanced first.  * LV Hemodynamics (LV Gram): AL-1 (using straight wire) * Left Coronary Artery Cineangiography: JL3.5 Catheter  * Right Coronary Artery Cineangiography: TIG 4.0 Catheter   Upon completion of Angiogaphy, the catheter was removed completely out of the body over a wire, without complication.  Femoral Sheath(s) removed in the PACU Holding Area with manual pressure for hemostasis.   Radial sheath removed in the Cardiac Catheterization lab with TR Band placed for hemostasis.  TR Band: 1345 Hours; 13 mL  air  MEDICATIONS * SQ Lidocaine 12mL; 15 mL for Femoral Venous Access. * Radial Cocktail: 3 mg Verapmil in 10 mL NS * Isovue Contrast: 90 mL * Heparin: 4000 Units  Fluoro time: 8.3 minutes. Dose Area Product: 08868 mGycm2. Cumulative Air Kerma: 306.7 mGy.   Estimated blood loss <50 mL.  During this procedure the patient was administered the following to achieve and maintain moderate conscious sedation: Versed 1 mg, Dilaudid 25 mg, while the patient's heart rate, blood pressure, and oxygen saturation were continuously monitored. The period of conscious sedation was 64 minutes, of which I was present face-to-face 100% of this time.    Complications   Complications documented before study signed (11/15/2016 2:07 PM EDT)    No complications were associated with this study.  Documented by Marykay Lex, MD - 11/15/2016 1:56 PM EDT    Coronary Findings   Dominance: Co-dominant  Left Main  Vessel was injected. Vessel is large. Vessel is angiographically normal.  Left Anterior Descending  Vessel was injected. Vessel is normal in caliber. Vessel is angiographically normal.  Dist LAD lesion, 45% stenosed. The lesion is tubular, eccentric and irregular.  First Diagonal Branch  Vessel was injected. Vessel is moderate in size. Vessel is angiographically normal.  1st Diag lesion, 45% stenosed. The lesion is tubular, concentric and smooth.  Lateral First Diagonal Branch  Vessel was injected. Vessel is small in size. Vessel is angiographically normal.  First Septal Branch  Vessel was injected. Vessel is small in size. Vessel is angiographically normal.  Second Septal Branch  Vessel was injected. Vessel is small in size. Vessel is angiographically normal.  Third Diagonal Branch  Vessel was injected. Vessel is small in size. Vessel is angiographically normal.  Third Septal Branch  Vessel was injected. Vessel is small in size. Vessel is angiographically normal.  Ramus Intermedius  Vessel  was injected. Vessel is normal in caliber. Vessel is angiographically normal.  Lateral Ramus Intermedius  Vessel was injected. Vessel is moderate in size. Vessel is angiographically normal.  Left Circumflex  Vessel was injected. Vessel is normal in caliber. Vessel is angiographically normal.  First Obtuse Marginal Branch  Vessel was injected. Vessel is normal in size. Vessel is angiographically normal.  Lateral First Obtuse Marginal Branch  Vessel was injected. Vessel is moderate in size. Vessel is angiographically normal.  Third Obtuse Marginal Branch  Vessel was injected. Vessel is moderate in size. Vessel is angiographically normal.  Lateral Third Obtuse Marginal Branch  Vessel was injected. Vessel is small in size. Vessel is angiographically  normal.  Right Coronary Artery  Vessel was injected. Vessel is large. Vessel is angiographically normal.  Prox RCA lesion, 30% stenosed. The lesion is concentric and irregular.  Acute Marginal Branch  Vessel was injected. Vessel is small in size. Vessel is angiographically normal.  Right Posterior Descending Artery  Vessel was injected. Vessel is moderate in size. Vessel is angiographically normal.  Ost RPDA lesion, 40% stenosed. The lesion is tubular.  Inferior Septal  Vessel was injected. Vessel is small in size. Vessel is angiographically normal.  Right Heart   Right Heart Pressures PAP/mean: 28/7/14 mmHg PCWP: 3-5 mmHg LV EDP is normal. LVP/EDP: 220/4/13 mmHg AoP/MAP: 167/62/106 mmHg AO sat% 98%; PA sat% 79% Cardiac Output/Index: Thermodilution-6.41/3.48; Fick 7.63/4.15    Right Atrium Right atrial pressure is decreased. 1-2 mmHg    Right Ventricle RVP/EDP: 30/0/2 mmHg    Wall Motion   Not assessed.        Left Heart   Aortic Valve There is severe aortic valve stenosis. P-P Gradient: 48-52 mmHg Mean Gradient: 40.5 mmHg AVA ~0.76 cm; AVA Index 0.41 cm/BSA    Coronary Diagrams   Diagnostic Diagram       Implants      No implant documentation for this case.  MERGE Images   Show images for CARDIAC CATHETERIZATION   Link to Procedure Log   Procedure Log    Hemo Data    Most Recent Value  Fick Cardiac Output 7.63 L/min  Fick Cardiac Output Index 4.15 (L/min)/BSA  Thermal Cardiac Output 6.41 L/min  Thermal Cardiac Output Index 3.48 (L/min)/BSA  Aortic Mean Gradient 40.5 mmHg  Aortic Peak Gradient 48 mmHg  Aortic Valve Area 0.76  Aortic Value Area Index 0.41 cm2/BSA  RA A Wave 2 mmHg  RA V Wave 1 mmHg  RA Mean 0 mmHg  RV Systolic Pressure 30 mmHg  RV Diastolic Pressure -1 mmHg  RV EDP 2 mmHg  PA Systolic Pressure 28 mmHg  PA Diastolic Pressure 7 mmHg  PA Mean 14 mmHg  PW A Wave 7 mmHg  PW V Wave 5 mmHg  PW Mean 3 mmHg  AO Systolic Pressure 173 mmHg  AO Diastolic Pressure 67 mmHg  AO Mean 112 mmHg  LV Systolic Pressure 220 mmHg  LV Diastolic Pressure 4 mmHg  LV EDP 13 mmHg  Arterial Occlusion Pressure Extended Systolic Pressure 167 mmHg  Arterial Occlusion Pressure Extended Diastolic Pressure 62 mmHg  Arterial Occlusion Pressure Extended Mean Pressure 106 mmHg  Left Ventricular Apex Extended Systolic Pressure 215 mmHg  Left Ventricular Apex Extended Diastolic Pressure 0 mmHg  Left Ventricular Apex Extended EDP Pressure 13 mmHg  TPVR Index 4.01 HRUI  TSVR Index 32.15 HRUI  TPVR/TSVR Ratio 0.13    CT CORONARY MORPH W/CTA COR W/SCORE W/CA W/CM &/OR WO/CM (Accession [id number]) (Order [id number])  Imaging  Date: 11/24/2016 Department: [facility] CT IMAGING Released By: Venita Lick Authorizing: Janetta Hora, PA-C  Exam Information   Status Exam Begun  Exam Ended   Final [99] 11/24/2016 10:41 AM 11/24/2016 11:01 AM  PACS Images   Show images for CT CORONARY MORPH W/CTA COR W/SCORE W/CA W/CM &/OR WO/CM  Addendum   ADDENDUM REPORT: 11/24/2016 11:25  CLINICAL DATA:  Aortic stenosis  EXAM: Cardiac TAVR CT  TECHNIQUE: The patient was scanned  on a Siemens [id number] scanner. A 120 kV retrospective scan was triggered in the ascending thoracic aorta at 140 HU's. Gantry rotation speed was 250 msecs and collimation was .6 mm.  No beta blockade or nitro were given. The 3D data set was reconstructed in 5% intervals of the R-R cycle. Systolic and diastolic phases were analyzed on a dedicated work station using MPR, MIP and VRT modes. The patient received 80 cc of contrast.  FINDINGS: Aortic Valve: Tri leaflet and calcified with restricted leaflet motion  Aorta: Marked mixed and calcific atherosclerosis of the entire aortic root, arch and descending thoracic aorta normal arch vessel origins  Sinotubular Junction:  26 mm  Ascending Thoracic Aorta:  30 mm  Aortic Arch:  27 mm  Descending Thoracic Aorta:  27 mm  Sinus of Valsalva Measurements:  Non-coronary:  31 mm  Right - coronary:  29.5 mm  Left - coronary:  30 mm  Coronary Artery Height above Annulus:  Left Main:  12.4 mm  Right Coronary:  14.6 mm  Virtual Basal Annulus Measurements:  Maximum/Minimum Diameter:  28.3 mm x 22.2 mm  Perimeter:  81.4 mm  Area:  491.4 mm2  Coronary Arteries:  Sufficient  height above annulus for deployment  Optimum Fluoroscopic Angle for Delivery: LAO 15 degrees Caudal 6 degrees  IMPRESSION: 1) Calcified tri leaflet aortic valve with annular area 491 mm2 and perimeter 81.4 mm suitable for a 26 mm Sapien 3 valve or a 29 mm Evolut Pro valve  2) Optimum angle for deployment LAO 15 degrees Caudal 6 degrees  3) Coronary arteries sufficient height above annulus for deployment  4) No LAA thrombus  5) STJ calcified around approximately 30% of its circumference  6) Fairly marked calcific atherosclerosis of the entire aortic root, arch and descending thoracic aorta  Jenkins Rouge   Electronically Signed   By: Jenkins Rouge M.D.   On: 11/24/2016 11:25   Addended by Josue Hector, MD on 11/24/2016  11:28 AM    Study Result   EXAM: OVER-READ INTERPRETATION  CT CHEST  The following report is an over-read performed by radiologist Dr. Rebekah Chesterfield [facility] on 11/24/2016. This over-read does not include interpretation of cardiac or coronary anatomy or pathology. The coronary calcium score/coronary CTA interpretation by the cardiologist is attached.  COMPARISON:  None.  FINDINGS: Relevant extracardiac findings will be described separately under dictation for contemporaneously obtained CTA of the chest, abdomen and pelvis.  IMPRESSION: Please see separate dictation for contemporaneously obtained CTA of the chest, abdomen and pelvis 11/24/2016 for full description of relevant extracardiac findings.  Electronically Signed: By: Vinnie Langton M.D. On: 11/24/2016 11:05       CT Angio Abd/Pel w/ and/or w/o (Accession [id number]) (Order [id number])  Imaging  Date: 11/24/2016 Department: [facility] CT IMAGING Released By: Reggy Eye Authorizing: Eileen Stanford, PA-C  Exam Information   Status Exam Begun  Exam Ended   Final [99] 11/24/2016 10:28 AM 11/24/2016 11:02 AM  PACS Images   Show images for CT Angio Abd/Pel w/ and/or w/o  Study Result   CLINICAL DATA:  83 year old female with history of severe aortic stenosis. Preprocedural study prior to potential transcatheter aortic valve replacement (TAVR) procedure.  EXAM: CT ANGIOGRAPHY CHEST, ABDOMEN AND PELVIS  TECHNIQUE: Multidetector CT imaging through the chest, abdomen and pelvis was performed using the standard protocol during bolus administration of intravenous contrast. Multiplanar reconstructed images and MIPs were obtained and reviewed to evaluate the vascular anatomy.  CONTRAST:  100 mL of Isovue 370.  COMPARISON:  No priors.  FINDINGS: CTA CHEST FINDINGS  Cardiovascular: Heart size is mildly enlarged with biatrial dilatation. There is no  significant pericardial fluid, thickening or pericardial calcification. There is aortic atherosclerosis, as well as atherosclerosis of the great vessels of the mediastinum and the coronary arteries, including calcified atherosclerotic plaque in the left anterior descending, left circumflex and right coronary arteries. Severe thickening calcification of the aortic valve. Moderate calcification of the posterior leaflet of the mitral valve and mitral annulus.  Mediastinum/Lymph Nodes: Borderline enlarged AP window lymph node measuring 9 mm in short axis is nonspecific. No other definite mediastinal or hilar lymphadenopathy is noted. Esophagus is unremarkable in appearance. No axillary lymphadenopathy.  Lungs/Pleura: Diffuse bronchial wall thickening with mild centrilobular and paraseptal emphysema. Calcified granuloma in the right upper lobe. No other definite suspicious appearing pulmonary nodules or masses are noted. No acute consolidative airspace disease. No pleural effusions. Areas of linear scarring are noted in the lungs bilaterally, most evident in the inferior segment of the lingula and inferior aspect of the left lower lobe.  Musculoskeletal/Soft Tissues: There are no aggressive appearing lytic or blastic lesions noted in the visualized portions of the skeleton.  CTA ABDOMEN AND PELVIS FINDINGS  Hepatobiliary: Multiple well-defined low-attenuation lesions in the liver are noted, compatible with simple cysts, measuring up to 1.5 cm in segment 6. No suspicious hepatic lesions. No intra or extrahepatic biliary ductal dilatation. Gallbladder is normal in appearance.  Pancreas: No pancreatic mass. No pancreatic ductal dilatation. No pancreatic or peripancreatic fluid or inflammatory changes.  Spleen: Unremarkable.  Adrenals/Urinary Tract: Bilateral kidneys and bilateral adrenal glands are normal in appearance. No hydroureteronephrosis. Urinary bladder is normal in  appearance.  Stomach/Bowel: Normal appearance of the stomach. No pathologic dilatation of small bowel or colon. Several colonic diverticulae are noted, without surrounding inflammatory changes to suggest an acute diverticulitis at this time. The appendix is not confidently identified and may be surgically absent. Regardless, there are no inflammatory changes noted adjacent to the cecum to suggest the presence of an acute appendicitis at this time.  Vascular/Lymphatic: Aortic atherosclerosis, with vascular findings and measurements pertinent to potential TAVR procedure, as detailed below. Fusiform ectasia of the infrarenal abdominal aorta which measures up to 2.7 x 2.6 cm. No aneurysm or dissection noted in the abdominal or pelvic vasculature. Celiac axis, superior mesenteric artery and inferior mesenteric artery are all widely patent without hemodynamically significant stenosis. One left and 2 right renal artery is are both patent without hemodynamically significant stenosis. No lymphadenopathy noted in the abdomen or pelvis.  Reproductive: Status posthysterectomy. Ovaries are not confidently identified may be surgically absent or atrophic.  Other: Trace volume of ascites in the low anatomic pelvis. No pneumoperitoneum.  Musculoskeletal: Status post PLIF from L2-L5 with interbody cages at L2-L3, L3-L4 and L4-L5. Status post laminectomy from L2-L4. There are no aggressive appearing lytic or blastic lesions noted in the visualized portions of the skeleton.  VASCULAR MEASUREMENTS PERTINENT TO TAVR:  AORTA:  Minimal Aortic Diameter -  13 x 10 mm  Severity of Aortic Calcification -  severe  RIGHT PELVIS:  Right Common Iliac Artery -  Minimal Diameter - 6.6 x 6.2 mm  Tortuosity - mild  Calcification - severe  Right External Iliac Artery -  Minimal Diameter - 4.2 x 5.0 mm  Tortuosity - mild  Calcification - moderate to severe  Right Common Femoral  Artery -  Minimal Diameter - 4.8 x 4.3 mm  Tortuosity - mild  Calcification - moderate to severe  LEFT PELVIS:  Left Common Iliac Artery -  Minimal Diameter - 7.8 x 6.3 mm  Tortuosity -  mild  Calcification - severe  Left External Iliac Artery -  Minimal Diameter - 5.5 x 3.9 mm  Tortuosity - mild  Calcification - moderate to severe  Left Common Femoral Artery -  Minimal Diameter - 7.2 x 2.3 mm  Tortuosity - mild  Calcification - moderate to severe  Review of the MIP images confirms the above findings.  IMPRESSION: 1. Vascular findings and measurements pertinent to potential TAVR procedure, as detailed above. This patient does not appear to have suitable pelvic arterial access on either side. 2. Severe thickening calcification of the aortic valve, compatible with reported clinical history of severe aortic stenosis. 3. Aortic atherosclerosis, in addition to three-vessel coronary artery disease. 4. Mild cardiomegaly with biatrial dilatation. 5. Mild colonic diverticulosis without evidence of acute diverticulitis at this time. 6. Trace ascites. 7. Additional incidental findings, as above. Aortic Atherosclerosis (ICD10-I70.0).   Electronically Signed   By: Vinnie Langton M.D.   On: 11/24/2016 11:58    RISK SCORES About the STS Risk Calculator Procedure: AV Replacement  Risk of Mortality: 7.726%  Morbidity or Mortality: 29.352%  Long Length of Stay: 16.137%  Short Length of Stay: 11.106%  Permanent Stroke: 3.823%  Prolonged Ventilation: 21.303%  DSW Infection: 0.37%  Renal Failure: 8.827%  Reoperation: 10.819%   Impression:  This 83 year old woman has stage D, severe, symptomatic aortic stenosis with NYHA class II symptoms of exertional shortness of breath and fatigue consistent with chronic diastolic heart failure. She also has some chest pressure with exertion and has had occasional episodes of dizziness with position changes,  although with her history of chronic vertigo it is hard to tell the significance of that. I have personally reviewed her echo, cath and CTA studies. Her echo shows a trileaflet aortic valve with severe thickening and calcification of the leaflets with reduced mobility. The mean gradient is 40 mm Hg consistent with severe AS. LV systolic function is normal. Her cath shows mild non-obstructive CAD. I agree that AVR is indicated in this active woman to improve her symptoms and maintain her high level of functioning. Her operative risk with open surgical AVR is moderate given her advanced age and comorbid medical conditions. I think TAVR would be the best treatment for her. Her gated cardiac CT shows anatomy suitable for a 26 mm Sapien 3 valve. Her abdominal and pelvic CT shows significant calcific atherosclerosis of the iliac and femoral vessels but she may have adequate transfemoral access. Alternative access would be possible through the left subclavian artery. She does have some plaque in her carotid arteries bilaterally on duplex exam but no significant stenosis.   The patient and her daughter were counseled at length regarding treatment alternatives for management of severe symptomatic aortic stenosis. The risks and benefits of surgical intervention has been discussed in detail. Long-term prognosis with medical therapy was discussed. Alternative approaches such as conventional surgical aortic valve replacement, transcatheter aortic valve replacement, and palliative medical therapy were compared and contrasted at length. This discussion was placed in the context of the patient's own specific clinical presentation and past medical history. All of their questions been addressed.   The patient has been advised of a variety of complications that might develop including but not limited to risks of death, stroke, paravalvular leak, aortic dissection or other major vascular complications, aortic annulus rupture,  device embolization, cardiac rupture or perforation, mitral regurgitation, acute myocardial infarction, arrhythmia, heart block or bradycardia requiring permanent pacemaker placement, congestive heart failure, respiratory failure, renal failure,  pneumonia, infection, other late complications related to structural valve deterioration or migration, or other complications that might ultimately cause a temporary or permanent loss of functional independence or other long term morbidity. The patient provides full informed consent for the procedure as described and all questions were answered.    Plan:  She will return for a second surgical evaluation by Dr. Roxy Manns before making a final decision about proceeding with TAVR. She is also scheduled for PT evaluation.   I spent 60 minutes performing this consultation and > 50% of this time was spent face to face counseling and coordinating the care of this patient's severe aortic stenosis    Gaye Pollack, MD 12/02/2016

## 2016-12-05 NOTE — Therapy (Signed)
[place], [place], [address] Phone: [phone]   Fax:  [phone]  Physical Therapy Evaluation  Patient Details  Name: Becky Mccall MRN: [id number] Date of Birth: 12/13/1929 Referring Provider: Angelena Form, PA  Encounter Date: 12/03/2016  Late entry Documentation for services provided 12/03/16.      PT End of Session - 12/05/16 0939    Visit Number 1   PT Start Time 0920   PT Stop Time 0950   PT Time Calculation (min) 30 min      Past Medical History:  Diagnosis Date  . Abrasion of left lower extremity 12/2012   draining wound on LLL;  . Anemia    a. Hgb 8.4 (2015), previously 13.4 in 2014.  . Arthritis    OA of hands and knees  . CAD in native artery    a.  [facility] 11/15/16 showing normal LVEDP, mild nonobstructive CAD (30% prox RCA, 40% ostial RPDA, 45% D1, 45% dLAD), severe AS with mean gradient 40.78mmHg, AVA 0.76cm2.   . Carotid stenosis    1-39% bilateral in 2015  . Cervical spondylolysis   . CKD (chronic kidney disease), stage III ([place])   . Colon polyp   . Complication of anesthesia    difficulty waking up after surgery  . COPD (chronic obstructive pulmonary disease) (HCC)    Spiriva inhaler daily  . DDD (degenerative disc disease), lumbar   . Fibrocystic breast disease   . GERD (gastroesophageal reflux disease)   . Hard of hearing    wears hearing aides both ears  . History of blood transfusion    with a back surgery  . Hyperlipidemia    can't take the meds so takes Fish Oil  . Hypertension   . Hypothyroidism   . IBS (irritable bowel syndrome)   . Intermittent dysphagia    for pills and solids  . OSA (obstructive sleep apnea)    patient stated that her sleep study was negative  . Osteopenia   . Severe aortic stenosis   . Squamous cell skin cancer   . TIA (transient ischemic attack)   . Vitamin D deficiency     Past Surgical History:  Procedure Laterality  Date  . ABDOMINAL HYSTERECTOMY    . APPENDECTOMY    . BACK SURGERY  2009, 2014  . CATARACT EXTRACTION W/ INTRAOCULAR LENS  IMPLANT, BILATERAL  1999,2000  . EYE SURGERY    . HEMORROIDECTOMY    . LUMBAR LAMINECTOMY  1999  . RIGHT/LEFT HEART CATH AND CORONARY ANGIOGRAPHY N/A 11/15/2016   Procedure: RIGHT/LEFT HEART CATH AND CORONARY ANGIOGRAPHY;  Surgeon: [name], MD;  Location: [facility];  Service: Cardiovascular;  Laterality: N/A;  . TONSILLECTOMY      There were no vitals filed for this visit.       Subjective Assessment - 12/05/16 0939    Subjective Pt reports shortness of breath with walking very much - such as walking into [employer], taking out the trash, and yardwork.    Patient Stated Goals to fix heart and then get L knee replaced   Currently in Pain? No/denies            [facility] PT Assessment - 12/05/16 0001      Assessment   Medical Diagnosis severe aortic stenosis   Referring Provider Angelena Form, PA   Onset Date/Surgical Date --  6 months     Precautions   Precautions None  Restrictions   Weight Bearing Restrictions No     Balance Screen   Has the patient fallen in the past 6 months No   Has the patient had a decrease in activity level because of a fear of falling?  No   Is the patient reluctant to leave their home because of a fear of falling?  No     Home Environment   Living Environment Private residence   [place] to enter   Entrance Stairs-Number of Steps 1   Entrance Stairs-Rails --  holds to gate   Home Layout One level     Posture/Postural Control   Posture/Postural Control Postural limitations   Postural Limitations Rounded Shoulders;Forward head     ROM / Strength   AROM / PROM / Strength AROM;Strength     AROM   Overall AROM Comments grossly WNL     Strength   Overall Strength Comments grossly 4 to 5/5 throughout   Strength Assessment Site Hand   Right/Left hand  Right;Left   Right Hand Grip (lbs) 34  R hand dominant   Left Hand Grip (lbs) 40     Ambulation/Gait   Gait Comments Pt ambulates with short step length bil and slow speed, mild unsteadiness but no LOB. Pt's gait distance was limited by 55% for her age/gender in 6 minute walk test.      6 Minute Walk- Baseline   6 Minute Walk- Baseline yes   BP (mmHg) 148/64   HR (bpm) 70   02 Sat (%RA) 93 %   Modified Borg Scale for Dyspnea 0- Nothing at all   Perceived Rate of Exertion (Borg) 6-     6 Minute walk- Post Test   6 Minute Walk Post Test yes   BP (mmHg) (!)  154/109   HR (bpm) 78   02 Sat (%RA) 90 %   Modified Borg Scale for Dyspnea 2- Mild shortness of breath   Perceived Rate of Exertion (Borg) 13- Somewhat hard     6 minute walk test results    Aerobic Endurance Distance Walked 575          [facility] Pre-Surgical Assessment - 12/05/16 0001    5 Meter Walk Test- trial 1 8 sec   5 Meter Walk Test- trial 2 8 sec.    5 Meter Walk Test- trial 3 6 sec.   5 meter walk test average 7.33 sec   4 Stage Balance Test tolerated for:  10 sec.   4 Stage Balance Test Position 2   Sit To Stand Test- trial 1 25 sec.   ADL/IADL Independent with: Bathing;Dressing;Meal prep;Finances   ADL/IADL Needs Assistance with: [place] work   ADL/IADL Fraility Index Vulnerable          Objective measurements completed on examination: See above findings.                               Plan - 12/05/16 0939    Clinical Impression Statement see below   PT Frequency One time visit     Clinical Impression Statement: Pt is an 83 yo female presenting to OP PT for evaluation prior to possible TAVR surgery due to severe aortic stenosis. Pt reports onset of shortness of breath approximately 6 months ago. Symptoms are limiting her ability to walk community distances, take out her trash or maintain yard independently. Pt presents with good ROM and  strength, poor balance and is at high fall  risk 4 stage balance test, slow walking speed and poor aerobic endurance per 6 minute walk test. Pt ambulated 370 feet in 3:00 before requesting a seated rest beak lasting 1:30. Pt able to resume after rest and ambulate an additional 205 feet. Pt ambulated a total of 575 feet in 6 minute walk. BP increased significantly with 6 minute walk test. Based on the Short Physical Performance Battery, patient has a frailty rating of 5/12 with </= 5/12 considered frail.   Patient demonstrated the following deficits and impairments:     Visit Diagnosis: Other abnormalities of gait and mobility  Abnormal posture     Problem List Patient Active Problem List   Diagnosis Date Noted  . SOB (shortness of breath) 10/28/2016  . Fibrocystic breast disease   . IBS (irritable bowel syndrome)   . Cervical spondylolysis   . Osteopenia   . Vitamin D deficiency   . DDD (degenerative disc disease), lumbar   . Squamous cell skin cancer   . Carotid stenosis   . OSA (obstructive sleep apnea)   . Colon polyp   . TIA (transient ischemic attack) 08/17/2013  . Vertigo 05/04/2013  . Hard of hearing   . Hyperlipidemia   . Heart murmur   . Severe aortic stenosis by prior echocardiography   . COPD (chronic obstructive pulmonary disease) ([place])   . Hypothyroidism   . GERD (gastroesophageal reflux disease)   . Spinal stenosis in cervical region 02/19/2013    [facility], PT 12/05/2016, 9:40 AM  [place], [place], [address] Phone: [phone]   Fax:  [phone]  Name: HAILY CALEY MRN: [id number] Date of Birth: 1929-11-03

## 2016-12-06 MED ORDER — SODIUM CHLORIDE 0.9 % IV SOLN
INTRAVENOUS | Status: DC
  Filled 2016-12-06: qty 1

## 2016-12-06 MED ORDER — MAGNESIUM SULFATE 50 % IJ SOLN
40.0000 meq | INTRAMUSCULAR | Status: DC
  Filled 2016-12-06: qty 10

## 2016-12-06 MED ORDER — DEXMEDETOMIDINE HCL IN NACL 400 MCG/100ML IV SOLN
0.1000 ug/kg/h | INTRAVENOUS | Status: AC
  Administered 2016-12-07: .5 ug/kg/h via INTRAVENOUS
  Filled 2016-12-06: qty 100

## 2016-12-06 MED ORDER — NITROGLYCERIN IN D5W 200-5 MCG/ML-% IV SOLN
2.0000 ug/min | INTRAVENOUS | Status: DC
  Filled 2016-12-06: qty 250

## 2016-12-06 MED ORDER — SODIUM CHLORIDE 0.9 % IV SOLN
30.0000 ug/min | INTRAVENOUS | Status: DC
  Filled 2016-12-06: qty 2

## 2016-12-06 MED ORDER — VANCOMYCIN HCL 10 G IV SOLR
1250.0000 mg | INTRAVENOUS | Status: AC
  Administered 2016-12-07: 1250 mg via INTRAVENOUS
  Filled 2016-12-06: qty 1250

## 2016-12-06 MED ORDER — POTASSIUM CHLORIDE 2 MEQ/ML IV SOLN
80.0000 meq | INTRAVENOUS | Status: DC
  Filled 2016-12-06: qty 40

## 2016-12-06 MED ORDER — DOPAMINE-DEXTROSE 3.2-5 MG/ML-% IV SOLN
0.0000 ug/kg/min | INTRAVENOUS | Status: DC
  Filled 2016-12-06: qty 250

## 2016-12-06 MED ORDER — DEXTROSE 5 % IV SOLN
1.5000 g | INTRAVENOUS | Status: AC
  Administered 2016-12-07: 1.5 g via INTRAVENOUS
  Filled 2016-12-06: qty 1.5

## 2016-12-06 MED ORDER — DEXTROSE 5 % IV SOLN
0.0000 ug/min | INTRAVENOUS | Status: DC
  Filled 2016-12-06: qty 4

## 2016-12-06 MED ORDER — SODIUM CHLORIDE 0.9 % IV SOLN
INTRAVENOUS | Status: DC
  Filled 2016-12-06: qty 30

## 2016-12-06 MED ORDER — EPINEPHRINE PF 1 MG/ML IJ SOLN
0.0000 ug/min | INTRAVENOUS | Status: DC
  Filled 2016-12-06: qty 4

## 2016-12-07 ENCOUNTER — Inpatient Hospital Stay (HOSPITAL_COMMUNITY): Payer: Medicare Other | Admitting: Emergency Medicine

## 2016-12-07 ENCOUNTER — Inpatient Hospital Stay (HOSPITAL_COMMUNITY): Payer: Medicare Other

## 2016-12-07 ENCOUNTER — Inpatient Hospital Stay (HOSPITAL_COMMUNITY)
Admission: RE | Admit: 2016-12-07 | Discharge: 2016-12-07 | Disposition: A | Discharge status: Home / Self Care | Payer: Medicare Other | Source: Ambulatory Visit | Attending: Cardiovascular Disease | Admitting: Cardiovascular Disease

## 2016-12-07 ENCOUNTER — Encounter (HOSPITAL_COMMUNITY): Payer: Self-pay | Admitting: General Practice

## 2016-12-07 ENCOUNTER — Inpatient Hospital Stay (HOSPITAL_COMMUNITY): Payer: Medicare Other | Admitting: Anesthesiology

## 2016-12-07 ENCOUNTER — Inpatient Hospital Stay (HOSPITAL_COMMUNITY)
Admission: RE | Admit: 2016-12-07 | Discharge: 2016-12-13 | DRG: 267 | Disposition: A | Discharge status: Home / Self Care | Payer: Medicare Other | Source: Ambulatory Visit | Attending: Thoracic Surgery (Cardiothoracic Vascular Surgery) | Admitting: Thoracic Surgery (Cardiothoracic Vascular Surgery)

## 2016-12-07 ENCOUNTER — Other Ambulatory Visit: Payer: Self-pay

## 2016-12-07 ENCOUNTER — Encounter (HOSPITAL_COMMUNITY)
Admission: RE | Disposition: A | Discharge status: Home / Self Care | Payer: Self-pay | Source: Ambulatory Visit | Attending: Thoracic Surgery (Cardiothoracic Vascular Surgery)

## 2016-12-07 DIAGNOSIS — Z7982 Long term (current) use of aspirin: Secondary | ICD-10-CM

## 2016-12-07 DIAGNOSIS — Z8673 Personal history of transient ischemic attack (TIA), and cerebral infarction without residual deficits: Secondary | ICD-10-CM | POA: Diagnosis not present

## 2016-12-07 DIAGNOSIS — K589 Irritable bowel syndrome without diarrhea: Secondary | ICD-10-CM | POA: Diagnosis present

## 2016-12-07 DIAGNOSIS — Z87891 Personal history of nicotine dependence: Secondary | ICD-10-CM

## 2016-12-07 DIAGNOSIS — Z8249 Family history of ischemic heart disease and other diseases of the circulatory system: Secondary | ICD-10-CM | POA: Diagnosis not present

## 2016-12-07 DIAGNOSIS — N183 Chronic kidney disease, stage 3 (moderate): Secondary | ICD-10-CM | POA: Diagnosis present

## 2016-12-07 DIAGNOSIS — Z952 Presence of prosthetic heart valve: Secondary | ICD-10-CM | POA: Diagnosis not present

## 2016-12-07 DIAGNOSIS — Z888 Allergy status to other drugs, medicaments and biological substances status: Secondary | ICD-10-CM | POA: Diagnosis not present

## 2016-12-07 DIAGNOSIS — E785 Hyperlipidemia, unspecified: Secondary | ICD-10-CM | POA: Diagnosis not present

## 2016-12-07 DIAGNOSIS — J90 Pleural effusion, not elsewhere classified: Secondary | ICD-10-CM | POA: Diagnosis not present

## 2016-12-07 DIAGNOSIS — Z79899 Other long term (current) drug therapy: Secondary | ICD-10-CM

## 2016-12-07 DIAGNOSIS — Z90710 Acquired absence of both cervix and uterus: Secondary | ICD-10-CM

## 2016-12-07 DIAGNOSIS — M79601 Pain in right arm: Secondary | ICD-10-CM | POA: Diagnosis not present

## 2016-12-07 DIAGNOSIS — M19041 Primary osteoarthritis, right hand: Secondary | ICD-10-CM | POA: Diagnosis not present

## 2016-12-07 DIAGNOSIS — M5136 Other intervertebral disc degeneration, lumbar region: Secondary | ICD-10-CM | POA: Diagnosis not present

## 2016-12-07 DIAGNOSIS — I5032 Chronic diastolic (congestive) heart failure: Secondary | ICD-10-CM | POA: Diagnosis not present

## 2016-12-07 DIAGNOSIS — I130 Hypertensive heart and chronic kidney disease with heart failure and stage 1 through stage 4 chronic kidney disease, or unspecified chronic kidney disease: Secondary | ICD-10-CM | POA: Diagnosis not present

## 2016-12-07 DIAGNOSIS — I350 Nonrheumatic aortic (valve) stenosis: Secondary | ICD-10-CM | POA: Diagnosis not present

## 2016-12-07 DIAGNOSIS — Z9842 Cataract extraction status, left eye: Secondary | ICD-10-CM | POA: Diagnosis not present

## 2016-12-07 DIAGNOSIS — J9811 Atelectasis: Secondary | ICD-10-CM

## 2016-12-07 DIAGNOSIS — D62 Acute posthemorrhagic anemia: Secondary | ICD-10-CM | POA: Diagnosis not present

## 2016-12-07 DIAGNOSIS — I2510 Atherosclerotic heart disease of native coronary artery without angina pectoris: Secondary | ICD-10-CM | POA: Diagnosis present

## 2016-12-07 DIAGNOSIS — I10 Essential (primary) hypertension: Secondary | ICD-10-CM | POA: Diagnosis not present

## 2016-12-07 DIAGNOSIS — M79609 Pain in unspecified limb: Secondary | ICD-10-CM | POA: Diagnosis not present

## 2016-12-07 DIAGNOSIS — I352 Nonrheumatic aortic (valve) stenosis with insufficiency: Secondary | ICD-10-CM | POA: Diagnosis not present

## 2016-12-07 DIAGNOSIS — E039 Hypothyroidism, unspecified: Secondary | ICD-10-CM | POA: Diagnosis not present

## 2016-12-07 DIAGNOSIS — R4182 Altered mental status, unspecified: Secondary | ICD-10-CM | POA: Diagnosis not present

## 2016-12-07 DIAGNOSIS — G4733 Obstructive sleep apnea (adult) (pediatric): Secondary | ICD-10-CM | POA: Diagnosis not present

## 2016-12-07 DIAGNOSIS — Z9841 Cataract extraction status, right eye: Secondary | ICD-10-CM | POA: Diagnosis not present

## 2016-12-07 DIAGNOSIS — M19042 Primary osteoarthritis, left hand: Secondary | ICD-10-CM | POA: Diagnosis not present

## 2016-12-07 DIAGNOSIS — I6529 Occlusion and stenosis of unspecified carotid artery: Secondary | ICD-10-CM | POA: Diagnosis present

## 2016-12-07 DIAGNOSIS — Z961 Presence of intraocular lens: Secondary | ICD-10-CM | POA: Diagnosis not present

## 2016-12-07 DIAGNOSIS — Z006 Encounter for examination for normal comparison and control in clinical research program: Secondary | ICD-10-CM | POA: Diagnosis not present

## 2016-12-07 DIAGNOSIS — R0602 Shortness of breath: Secondary | ICD-10-CM | POA: Diagnosis present

## 2016-12-07 DIAGNOSIS — J439 Emphysema, unspecified: Secondary | ICD-10-CM | POA: Diagnosis not present

## 2016-12-07 DIAGNOSIS — R531 Weakness: Secondary | ICD-10-CM | POA: Diagnosis not present

## 2016-12-07 DIAGNOSIS — J449 Chronic obstructive pulmonary disease, unspecified: Secondary | ICD-10-CM | POA: Diagnosis not present

## 2016-12-07 DIAGNOSIS — K219 Gastro-esophageal reflux disease without esophagitis: Secondary | ICD-10-CM | POA: Diagnosis present

## 2016-12-07 DIAGNOSIS — Z79890 Hormone replacement therapy (postmenopausal): Secondary | ICD-10-CM

## 2016-12-07 DIAGNOSIS — Z48812 Encounter for surgical aftercare following surgery on the circulatory system: Secondary | ICD-10-CM | POA: Diagnosis not present

## 2016-12-07 DIAGNOSIS — Z954 Presence of other heart-valve replacement: Secondary | ICD-10-CM | POA: Diagnosis not present

## 2016-12-07 DIAGNOSIS — I639 Cerebral infarction, unspecified: Secondary | ICD-10-CM | POA: Diagnosis not present

## 2016-12-07 HISTORY — DX: Other chronic pain: G89.29

## 2016-12-07 HISTORY — PX: CARDIAC VALVE REPLACEMENT: SHX585

## 2016-12-07 HISTORY — DX: Low back pain, unspecified: M54.50

## 2016-12-07 HISTORY — PX: TEE WITHOUT CARDIOVERSION: SHX5443

## 2016-12-07 HISTORY — PX: TRANSCATHETER AORTIC VALVE REPLACEMENT, TRANSFEMORAL: SHX6400

## 2016-12-07 HISTORY — DX: Low back pain: M54.5

## 2016-12-07 HISTORY — DX: Presence of prosthetic heart valve: Z95.2

## 2016-12-07 LAB — POCT I-STAT, CHEM 8
BUN: 15 mg/dL (ref 6–20)
BUN: 14 mg/dL (ref 6–20)
BUN: 15 mg/dL (ref 6–20)
CALCIUM ION: 1.15 mmol/L (ref 1.15–1.40)
CALCIUM ION: 1.17 mmol/L (ref 1.15–1.40)
CHLORIDE: 106 mmol/L (ref 101–111)
CHLORIDE: 105 mmol/L (ref 101–111)
CREATININE: 0.8 mg/dL (ref 0.44–1.00)
CREATININE: 0.9 mg/dL (ref 0.44–1.00)
CREATININE: 1 mg/dL (ref 0.44–1.00)
Calcium, Ion: 1.14 mmol/L — ABNORMAL LOW (ref 1.15–1.40)
Chloride: 105 mmol/L (ref 101–111)
GLUCOSE: 91 mg/dL (ref 65–99)
GLUCOSE: 107 mg/dL — AB (ref 65–99)
Glucose, Bld: 111 mg/dL — ABNORMAL HIGH (ref 65–99)
HCT: 34 % — ABNORMAL LOW (ref 36.0–46.0)
HCT: 33 % — ABNORMAL LOW (ref 36.0–46.0)
HCT: 34 % — ABNORMAL LOW (ref 36.0–46.0)
Hemoglobin: 11.6 g/dL — ABNORMAL LOW (ref 12.0–15.0)
Hemoglobin: 11.2 g/dL — ABNORMAL LOW (ref 12.0–15.0)
Hemoglobin: 11.6 g/dL — ABNORMAL LOW (ref 12.0–15.0)
POTASSIUM: 4.1 mmol/L (ref 3.5–5.1)
Potassium: 3.7 mmol/L (ref 3.5–5.1)
Potassium: 3.9 mmol/L (ref 3.5–5.1)
Sodium: 140 mmol/L (ref 135–145)
Sodium: 141 mmol/L (ref 135–145)
Sodium: 142 mmol/L (ref 135–145)
TCO2: 28 mmol/L (ref 22–32)
TCO2: 26 mmol/L (ref 22–32)
TCO2: 27 mmol/L (ref 22–32)

## 2016-12-07 LAB — POCT I-STAT 3, ART BLOOD GAS (G3+)
Acid-Base Excess: 3 mmol/L — ABNORMAL HIGH (ref 0.0–2.0)
BICARBONATE: 29.4 mmol/L — AB (ref 20.0–28.0)
O2 Saturation: 96 %
PCO2 ART: 49.3 mmHg — AB (ref 32.0–48.0)
PH ART: 7.381 (ref 7.350–7.450)
PO2 ART: 87 mmHg (ref 83.0–108.0)
Patient temperature: 97.8
TCO2: 31 mmol/L (ref 22–32)

## 2016-12-07 LAB — APTT: aPTT: 37 seconds — ABNORMAL HIGH (ref 24–36)

## 2016-12-07 LAB — POCT I-STAT 4, (NA,K, GLUC, HGB,HCT)
Glucose, Bld: 99 mg/dL (ref 65–99)
HCT: 33 % — ABNORMAL LOW (ref 36.0–46.0)
Hemoglobin: 11.2 g/dL — ABNORMAL LOW (ref 12.0–15.0)
Potassium: 3.9 mmol/L (ref 3.5–5.1)
Sodium: 144 mmol/L (ref 135–145)

## 2016-12-07 LAB — GLUCOSE, CAPILLARY: Glucose-Capillary: 56 mg/dL — ABNORMAL LOW (ref 65–99)

## 2016-12-07 LAB — CBC
HCT: 35.6 % — ABNORMAL LOW (ref 36.0–46.0)
Hemoglobin: 11.3 g/dL — ABNORMAL LOW (ref 12.0–15.0)
MCH: 28.4 pg (ref 26.0–34.0)
MCHC: 31.7 g/dL (ref 30.0–36.0)
MCV: 89.4 fL (ref 78.0–100.0)
PLATELETS: 152 10*3/uL (ref 150–400)
RBC: 3.98 MIL/uL (ref 3.87–5.11)
RDW: 14.6 % (ref 11.5–15.5)
WBC: 5.1 10*3/uL (ref 4.0–10.5)

## 2016-12-07 LAB — PROTIME-INR
INR: 1.17
PROTHROMBIN TIME: 14.8 s (ref 11.4–15.2)

## 2016-12-07 SURGERY — IMPLANTATION, AORTIC VALVE, TRANSCATHETER, CAROTID ARTERY APPROACH
Anesthesia: General

## 2016-12-07 MED ORDER — ONDANSETRON HCL 4 MG/2ML IJ SOLN
INTRAMUSCULAR | Status: AC
  Filled 2016-12-07: qty 2

## 2016-12-07 MED ORDER — SODIUM CHLORIDE 0.9 % IV SOLN
INTRAVENOUS | Status: DC
  Administered 2016-12-07: 13:00:00 via INTRAVENOUS

## 2016-12-07 MED ORDER — SODIUM CHLORIDE 0.9% FLUSH: 10.0000 mL | INTRAVENOUS | Status: DC | PRN

## 2016-12-07 MED ORDER — CHLORHEXIDINE GLUCONATE 0.12 % MT SOLN
15.0000 mL | Freq: Once | OROMUCOSAL | Status: AC
  Administered 2016-12-07: 15 mL via OROMUCOSAL

## 2016-12-07 MED ORDER — ALBUMIN HUMAN 5 % IV SOLN: 250.0000 mL | INTRAVENOUS | Status: DC | PRN

## 2016-12-07 MED ORDER — SUGAMMADEX SODIUM 200 MG/2ML IV SOLN
INTRAVENOUS | Status: AC
  Filled 2016-12-07: qty 2

## 2016-12-07 MED ORDER — ROCURONIUM BROMIDE 50 MG/5ML IV SOLN
INTRAVENOUS | Status: AC
  Filled 2016-12-07: qty 1

## 2016-12-07 MED ORDER — LACTATED RINGERS IV SOLN
INTRAVENOUS | Status: DC | PRN
  Administered 2016-12-07: 13:00:00 via INTRAVENOUS

## 2016-12-07 MED ORDER — FENTANYL CITRATE (PF) 100 MCG/2ML IJ SOLN
INTRAMUSCULAR | Status: AC
  Administered 2016-12-07: 100 ug via INTRAVENOUS
  Filled 2016-12-07: qty 2

## 2016-12-07 MED ORDER — ROCURONIUM BROMIDE 100 MG/10ML IV SOLN
INTRAVENOUS | Status: DC | PRN
  Administered 2016-12-07: 50 mg via INTRAVENOUS

## 2016-12-07 MED ORDER — FENTANYL CITRATE (PF) 250 MCG/5ML IJ SOLN
INTRAMUSCULAR | Status: AC
  Filled 2016-12-07: qty 5

## 2016-12-07 MED ORDER — ASPIRIN EC 81 MG PO TBEC
81.0000 mg | DELAYED_RELEASE_TABLET | Freq: Every day | ORAL | Status: DC
  Administered 2016-12-08 – 2016-12-13 (×6): 81 mg via ORAL
  Filled 2016-12-07 (×6): qty 1

## 2016-12-07 MED ORDER — NITROGLYCERIN IN D5W 200-5 MCG/ML-% IV SOLN
0.0000 ug/min | INTRAVENOUS | Status: DC
  Administered 2016-12-07: 5 ug/min via INTRAVENOUS

## 2016-12-07 MED ORDER — FAMOTIDINE IN NACL 20-0.9 MG/50ML-% IV SOLN
20.0000 mg | Freq: Two times a day (BID) | INTRAVENOUS | Status: AC
Start: 2016-12-07 — End: 2016-12-08
  Administered 2016-12-07 – 2016-12-08 (×2): 20 mg via INTRAVENOUS
  Filled 2016-12-07 (×2): qty 50

## 2016-12-07 MED ORDER — MECLIZINE HCL 12.5 MG PO TABS: 12.5000 mg | ORAL_TABLET | Freq: Four times a day (QID) | ORAL | Status: DC | PRN

## 2016-12-07 MED ORDER — MIDAZOLAM HCL 2 MG/2ML IJ SOLN
INTRAMUSCULAR | Status: AC
  Administered 2016-12-07: 1 mg via INTRAVENOUS
  Filled 2016-12-07: qty 2

## 2016-12-07 MED ORDER — IODIXANOL 320 MG/ML IV SOLN
INTRAVENOUS | Status: DC | PRN
  Administered 2016-12-07: 80.8 mL via INTRA_ARTERIAL

## 2016-12-07 MED ORDER — CHLORHEXIDINE GLUCONATE 4 % EX LIQD: 60.0000 mL | Freq: Once | CUTANEOUS | Status: DC

## 2016-12-07 MED ORDER — SODIUM CHLORIDE 0.9% FLUSH
3.0000 mL | Freq: Two times a day (BID) | INTRAVENOUS | Status: DC
  Administered 2016-12-08: 3 mL via INTRAVENOUS

## 2016-12-07 MED ORDER — ONDANSETRON HCL 4 MG/2ML IJ SOLN
4.0000 mg | Freq: Four times a day (QID) | INTRAMUSCULAR | Status: DC | PRN
  Filled 2016-12-07: qty 2

## 2016-12-07 MED ORDER — MIDAZOLAM HCL 2 MG/2ML IJ SOLN
1.0000 mg | Freq: Once | INTRAMUSCULAR | Status: AC
  Administered 2016-12-07: 1 mg via INTRAVENOUS

## 2016-12-07 MED ORDER — OXYCODONE HCL 5 MG PO TABS
5.0000 mg | ORAL_TABLET | ORAL | Status: DC | PRN
  Administered 2016-12-07: 5 mg via ORAL
  Administered 2016-12-08 – 2016-12-09 (×5): 10 mg via ORAL
  Filled 2016-12-07: qty 2
  Filled 2016-12-07: qty 1
  Filled 2016-12-07 (×4): qty 2

## 2016-12-07 MED ORDER — SODIUM CHLORIDE 0.9% FLUSH
10.0000 mL | Freq: Two times a day (BID) | INTRAVENOUS | Status: DC
  Administered 2016-12-07 – 2016-12-08 (×2): 10 mL

## 2016-12-07 MED ORDER — LACTATED RINGERS IV SOLN
INTRAVENOUS | Status: DC | PRN
  Administered 2016-12-07: 12:00:00 via INTRAVENOUS

## 2016-12-07 MED ORDER — SODIUM CHLORIDE 0.9% FLUSH: 3.0000 mL | INTRAVENOUS | Status: DC | PRN

## 2016-12-07 MED ORDER — NOREPINEPHRINE BITARTRATE 1 MG/ML IV SOLN
INTRAVENOUS | Status: DC | PRN
  Administered 2016-12-07: 4 ug/min via INTRAVENOUS

## 2016-12-07 MED ORDER — CHLORHEXIDINE GLUCONATE CLOTH 2 % EX PADS
6.0000 | MEDICATED_PAD | Freq: Every day | CUTANEOUS | Status: DC
  Administered 2016-12-08 – 2016-12-10 (×3): 6 via TOPICAL

## 2016-12-07 MED ORDER — MIDAZOLAM HCL 2 MG/2ML IJ SOLN: 2.0000 mg | INTRAMUSCULAR | Status: DC | PRN

## 2016-12-07 MED ORDER — MORPHINE SULFATE (PF) 4 MG/ML IV SOLN
2.0000 mg | INTRAVENOUS | Status: DC | PRN
  Administered 2016-12-07: 3 mg via INTRAVENOUS
  Administered 2016-12-08: 4 mg via INTRAVENOUS
  Administered 2016-12-08: 2 mg via INTRAVENOUS
  Administered 2016-12-09: 4 mg via INTRAVENOUS
  Filled 2016-12-07 (×4): qty 1

## 2016-12-07 MED ORDER — CHLORHEXIDINE GLUCONATE 0.12 % MT SOLN
15.0000 mL | OROMUCOSAL | Status: AC
  Administered 2016-12-07: 15 mL via OROMUCOSAL

## 2016-12-07 MED ORDER — PHENYLEPHRINE 40 MCG/ML (10ML) SYRINGE FOR IV PUSH (FOR BLOOD PRESSURE SUPPORT)
PREFILLED_SYRINGE | INTRAVENOUS | Status: AC
  Filled 2016-12-07: qty 10

## 2016-12-07 MED ORDER — MAGNESIUM OXIDE 400 (241.3 MG) MG PO TABS
800.0000 mg | ORAL_TABLET | Freq: Every day | ORAL | Status: DC
  Administered 2016-12-07 – 2016-12-11 (×5): 800 mg via ORAL
  Filled 2016-12-07 (×5): qty 2

## 2016-12-07 MED ORDER — CHLORHEXIDINE GLUCONATE 4 % EX LIQD: 30.0000 mL | CUTANEOUS | Status: DC

## 2016-12-07 MED ORDER — MAGNESIUM 500 MG PO TABS: 1000.0000 mg | ORAL_TABLET | Freq: Every day | ORAL | Status: DC

## 2016-12-07 MED ORDER — VANCOMYCIN HCL IN DEXTROSE 1-5 GM/200ML-% IV SOLN
1000.0000 mg | Freq: Once | INTRAVENOUS | Status: AC
  Administered 2016-12-07: 1000 mg via INTRAVENOUS
  Filled 2016-12-07: qty 200

## 2016-12-07 MED ORDER — FENTANYL CITRATE (PF) 100 MCG/2ML IJ SOLN
100.0000 ug | Freq: Once | INTRAMUSCULAR | Status: AC
  Administered 2016-12-07: 100 ug via INTRAVENOUS

## 2016-12-07 MED ORDER — TRAMADOL HCL 50 MG PO TABS
50.0000 mg | ORAL_TABLET | Freq: Three times a day (TID) | ORAL | Status: DC | PRN
  Administered 2016-12-07: 50 mg via ORAL
  Filled 2016-12-07: qty 1

## 2016-12-07 MED ORDER — METOPROLOL TARTRATE 5 MG/5ML IV SOLN
2.5000 mg | INTRAVENOUS | Status: DC | PRN
  Administered 2016-12-09 (×2): 2.5 mg via INTRAVENOUS
  Administered 2016-12-11: 5 mg via INTRAVENOUS
  Filled 2016-12-07 (×3): qty 5

## 2016-12-07 MED ORDER — SODIUM CHLORIDE 0.9 % IV SOLN: 250.0000 mL | INTRAVENOUS | Status: DC | PRN

## 2016-12-07 MED ORDER — ONDANSETRON HCL 4 MG/2ML IJ SOLN
INTRAMUSCULAR | Status: DC | PRN
  Administered 2016-12-07: 4 mg via INTRAVENOUS

## 2016-12-07 MED ORDER — TURMERIC 500 MG PO CAPS: 500.0000 mg | ORAL_CAPSULE | Freq: Two times a day (BID) | ORAL | Status: DC

## 2016-12-07 MED ORDER — LEVOTHYROXINE SODIUM 25 MCG PO TABS
137.0000 ug | ORAL_TABLET | Freq: Every day | ORAL | Status: DC
  Administered 2016-12-08 – 2016-12-13 (×6): 137 ug via ORAL
  Filled 2016-12-07 (×6): qty 1

## 2016-12-07 MED ORDER — PROPOFOL 10 MG/ML IV BOLUS
INTRAVENOUS | Status: AC
  Filled 2016-12-07: qty 20

## 2016-12-07 MED ORDER — LACTATED RINGERS IV SOLN: 500.0000 mL | Freq: Once | INTRAVENOUS | Status: DC | PRN

## 2016-12-07 MED ORDER — PHENYLEPHRINE HCL 10 MG/ML IJ SOLN
0.0000 ug/min | INTRAMUSCULAR | Status: DC
  Filled 2016-12-07: qty 2

## 2016-12-07 MED ORDER — PROPOFOL 10 MG/ML IV BOLUS
INTRAVENOUS | Status: DC | PRN
  Administered 2016-12-07: 50 mg via INTRAVENOUS
  Administered 2016-12-07: 20 mg via INTRAVENOUS

## 2016-12-07 MED ORDER — LIDOCAINE 2% (20 MG/ML) 5 ML SYRINGE
INTRAMUSCULAR | Status: AC
  Filled 2016-12-07: qty 5

## 2016-12-07 MED ORDER — VITAMIN D 1000 UNITS PO TABS
6000.0000 [IU] | ORAL_TABLET | Freq: Every day | ORAL | Status: DC
  Administered 2016-12-08 – 2016-12-13 (×6): 6000 [IU] via ORAL
  Filled 2016-12-07 (×6): qty 6

## 2016-12-07 MED ORDER — SODIUM CHLORIDE 0.9 % IV SOLN
1.0000 mL/kg/h | INTRAVENOUS | Status: DC
  Administered 2016-12-07: 1 mL/kg/h via INTRAVENOUS

## 2016-12-07 MED ORDER — PANTOPRAZOLE SODIUM 40 MG PO TBEC
40.0000 mg | DELAYED_RELEASE_TABLET | Freq: Every day | ORAL | Status: DC
  Administered 2016-12-09 – 2016-12-13 (×5): 40 mg via ORAL
  Filled 2016-12-07 (×5): qty 1

## 2016-12-07 MED ORDER — HEPARIN SODIUM (PORCINE) 1000 UNIT/ML IJ SOLN
INTRAMUSCULAR | Status: DC | PRN
  Administered 2016-12-07: 11000 [IU] via INTRAVENOUS

## 2016-12-07 MED ORDER — PROTAMINE SULFATE 10 MG/ML IV SOLN
INTRAVENOUS | Status: DC | PRN
  Administered 2016-12-07: 110 mg via INTRAVENOUS

## 2016-12-07 MED ORDER — CEFUROXIME SODIUM 1.5 G IV SOLR
1.5000 g | Freq: Two times a day (BID) | INTRAVENOUS | Status: AC
  Administered 2016-12-07 – 2016-12-09 (×4): 1.5 g via INTRAVENOUS
  Filled 2016-12-07 (×4): qty 1.5

## 2016-12-07 MED ORDER — 0.9 % SODIUM CHLORIDE (POUR BTL) OPTIME
TOPICAL | Status: DC | PRN
  Administered 2016-12-07: 5000 mL

## 2016-12-07 MED ORDER — CHLORHEXIDINE GLUCONATE 0.12 % MT SOLN
OROMUCOSAL | Status: AC
  Administered 2016-12-07: 15 mL via OROMUCOSAL
  Filled 2016-12-07: qty 15

## 2016-12-07 MED ORDER — AMLODIPINE BESYLATE 2.5 MG PO TABS
2.5000 mg | ORAL_TABLET | Freq: Every day | ORAL | Status: DC
  Administered 2016-12-07 – 2016-12-08 (×2): 2.5 mg via ORAL
  Filled 2016-12-07 (×2): qty 1

## 2016-12-07 MED ORDER — CALCIUM CARB-CHOLECALCIFEROL 1000-800 MG-UNIT PO TABS: ORAL_TABLET | Freq: Every day | ORAL | Status: DC

## 2016-12-07 MED ORDER — CALCIUM CHLORIDE 10 % IV SOLN
INTRAVENOUS | Status: AC
  Filled 2016-12-07: qty 10

## 2016-12-07 MED ORDER — CLOPIDOGREL BISULFATE 75 MG PO TABS
75.0000 mg | ORAL_TABLET | Freq: Every day | ORAL | Status: DC
  Administered 2016-12-08 – 2016-12-13 (×6): 75 mg via ORAL
  Filled 2016-12-07 (×6): qty 1

## 2016-12-07 MED ORDER — HEPARIN SODIUM (PORCINE) 1000 UNIT/ML IJ SOLN
INTRAMUSCULAR | Status: AC
  Filled 2016-12-07: qty 1

## 2016-12-07 MED ORDER — LIDOCAINE HCL (CARDIAC) 20 MG/ML IV SOLN
INTRAVENOUS | Status: DC | PRN
  Administered 2016-12-07: 20 mg via INTRAVENOUS

## 2016-12-07 MED ORDER — SODIUM CHLORIDE 0.9 % IV SOLN
INTRAVENOUS | Status: DC | PRN
  Administered 2016-12-07: 500 mL

## 2016-12-07 MED ORDER — CALCIUM CARBONATE-VITAMIN D 500-200 MG-UNIT PO TABS
1.0000 | ORAL_TABLET | Freq: Every day | ORAL | Status: DC
  Administered 2016-12-08 – 2016-12-13 (×6): 1 via ORAL
  Filled 2016-12-07 (×6): qty 1

## 2016-12-07 MED ORDER — FENTANYL CITRATE (PF) 100 MCG/2ML IJ SOLN
INTRAMUSCULAR | Status: DC | PRN
  Administered 2016-12-07 (×2): 100 ug via INTRAVENOUS

## 2016-12-07 MED ORDER — OMEGA-3-ACID ETHYL ESTERS 1 G PO CAPS
1.0000 g | ORAL_CAPSULE | Freq: Three times a day (TID) | ORAL | Status: DC
  Administered 2016-12-07 – 2016-12-13 (×17): 1 g via ORAL
  Filled 2016-12-07 (×17): qty 1

## 2016-12-07 MED ORDER — ADULT MULTIVITAMIN W/MINERALS CH
1.0000 | ORAL_TABLET | Freq: Every day | ORAL | Status: DC
  Administered 2016-12-08 – 2016-12-13 (×6): 1 via ORAL
  Filled 2016-12-07 (×6): qty 1

## 2016-12-07 MED ORDER — DOXYLAMINE SUCCINATE (SLEEP) 25 MG PO TABS: 50.0000 mg | ORAL_TABLET | Freq: Every day | ORAL | Status: DC

## 2016-12-07 SURGICAL SUPPLY — 115 items
ADAPTER UNIV SWAN GANZ BIP (ADAPTER) ×2 IMPLANT
ADAPTER UNV SWAN GANZ BIP (ADAPTER) ×1
ADH SKN CLS APL DERMABOND .7 (GAUZE/BANDAGES/DRESSINGS) ×2
ADPR CATH UNV NS SG CATH (ADAPTER) ×2
ATTRACTOMAT 16X20 MAGNETIC DRP (DRAPES) IMPLANT
BAG BANDED W/RUBBER/TAPE 36X54 (MISCELLANEOUS) ×3 IMPLANT
BAG DECANTER FOR FLEXI CONT (MISCELLANEOUS) IMPLANT
BAG EQP BAND 135X91 W/RBR TAPE (MISCELLANEOUS) ×2
BAG SNAP BAND KOVER 36X36 (MISCELLANEOUS) ×6 IMPLANT
BLADE 10 SAFETY STRL DISP (BLADE) ×3 IMPLANT
BLADE CLIPPER SURG (BLADE) IMPLANT
BLADE STERNUM SYSTEM 6 (BLADE) ×3 IMPLANT
CABLE ADAPT CONN TEMP 6FT (ADAPTER) ×3 IMPLANT
CABLE PACING FASLOC BIEGE (MISCELLANEOUS) ×3 IMPLANT
CABLE PACING FASLOC BLUE (MISCELLANEOUS) ×3 IMPLANT
CANISTER SUCT 3000ML PPV (MISCELLANEOUS) IMPLANT
CANNULA FEM VENOUS REMOTE 22FR (CANNULA) IMPLANT
CANNULA OPTISITE PERFUSION 16F (CANNULA) IMPLANT
CANNULA OPTISITE PERFUSION 18F (CANNULA) IMPLANT
CATH ACCU-VU SIZ PIG 5F 70CM (CATHETERS) ×4 IMPLANT
CATH ANGIO 5F BER2 65CM (CATHETERS) ×2 IMPLANT
CATH DIAG EXPO 6F VENT PIG 145 (CATHETERS) ×4 IMPLANT
CATH EXPO 5FR AL1 (CATHETERS) ×1 IMPLANT
CATH JR4 65CM 5FR (CATHETERS) ×2 IMPLANT
CATH S G BIP PACING (SET/KITS/TRAYS/PACK) ×4 IMPLANT
CLIP TI MEDIUM 6 (CLIP) ×2 IMPLANT
CLIP TI WIDE RED SMALL 6 (CLIP) ×4 IMPLANT
CLIP VESOCCLUDE MED 24/CT (CLIP) ×3 IMPLANT
CLIP VESOCCLUDE SM WIDE 24/CT (CLIP) ×3 IMPLANT
CONT SPEC 4OZ CLIKSEAL STRL BL (MISCELLANEOUS) ×6 IMPLANT
COVER BACK TABLE 60X90IN (DRAPES) ×3 IMPLANT
COVER BACK TABLE 80X110 HD (DRAPES) ×3 IMPLANT
COVER DOME SNAP 22 D (MISCELLANEOUS) ×3 IMPLANT
COVER MAYO STAND STRL (DRAPES) ×3 IMPLANT
COVER PROBE W GEL 5X96 (DRAPES) ×2 IMPLANT
CRADLE DONUT ADULT HEAD (MISCELLANEOUS) ×3 IMPLANT
DERMABOND ADVANCED (GAUZE/BANDAGES/DRESSINGS) ×1
DERMABOND ADVANCED .7 DNX12 (GAUZE/BANDAGES/DRESSINGS) ×2 IMPLANT
DEVICE CLOSURE PERCLS PRGLD 6F (VASCULAR PRODUCTS) IMPLANT
DRAPE INCISE IOBAN 66X45 STRL (DRAPES) IMPLANT
DRAPE SLUSH MACHINE 52X66 (DRAPES) ×3 IMPLANT
DRSG COVADERM 4X6 (GAUZE/BANDAGES/DRESSINGS) ×2 IMPLANT
DRSG TEGADERM 4X4.75 (GAUZE/BANDAGES/DRESSINGS) ×3 IMPLANT
ELECT REM PT RETURN 9FT ADLT (ELECTROSURGICAL) ×6
ELECTRODE REM PT RTRN 9FT ADLT (ELECTROSURGICAL) ×4 IMPLANT
EMERALD GUIDEWIRE .035 X 150CM STRAIGHT TIP ×2 IMPLANT
FELT TEFLON 1X6 (MISCELLANEOUS) ×2 IMPLANT
FELT TEFLON 6X6 (MISCELLANEOUS) ×3 IMPLANT
FEMORAL VENOUS CANN RAP (CANNULA) IMPLANT
GAUZE SPONGE 4X4 12PLY STRL (GAUZE/BANDAGES/DRESSINGS) ×3 IMPLANT
GAUZE SPONGE 4X4 12PLY STRL LF (GAUZE/BANDAGES/DRESSINGS) ×2 IMPLANT
GLOVE BIO SURGEON STRL SZ7.5 (GLOVE) ×3 IMPLANT
GLOVE BIO SURGEON STRL SZ8 (GLOVE) ×6 IMPLANT
GLOVE BIOGEL PI IND STRL 6.5 (GLOVE) ×1 IMPLANT
GLOVE BIOGEL PI INDICATOR 6.5 (GLOVE) ×1
GLOVE EUDERMIC 7 POWDERFREE (GLOVE) ×3 IMPLANT
GLOVE ORTHO TXT STRL SZ7.5 (GLOVE) ×3 IMPLANT
GOWN STRL REUS W/ TWL LRG LVL3 (GOWN DISPOSABLE) ×6 IMPLANT
GOWN STRL REUS W/ TWL XL LVL3 (GOWN DISPOSABLE) ×12 IMPLANT
GOWN STRL REUS W/TWL LRG LVL3 (GOWN DISPOSABLE) ×9
GOWN STRL REUS W/TWL XL LVL3 (GOWN DISPOSABLE) ×18
GUIDEWIRE SAF TJ AMPL .035X180 (WIRE) ×5 IMPLANT
GUIDEWIRE SAFE TJ AMPLATZ EXST (WIRE) ×1 IMPLANT
GUIDEWIRE STRAIGHT .035 260CM (WIRE) ×1 IMPLANT
INSERT FOGARTY 61MM (MISCELLANEOUS) ×3 IMPLANT
INSERT FOGARTY SM (MISCELLANEOUS) IMPLANT
INSERT FOGARTY XLG (MISCELLANEOUS) IMPLANT
KIT BASIN OR (CUSTOM PROCEDURE TRAY) ×3 IMPLANT
KIT DILATOR VASC 18G NDL (KITS) IMPLANT
KIT HEART LEFT (KITS) ×3 IMPLANT
KIT ROOM TURNOVER OR (KITS) ×3 IMPLANT
KIT SUCTION CATH 14FR (SUCTIONS) ×6 IMPLANT
NDL PERC 18GX7CM (NEEDLE) ×1 IMPLANT
NEEDLE PERC 18GX7CM (NEEDLE) ×3 IMPLANT
NS IRRIG 1000ML POUR BTL (IV SOLUTION) ×9 IMPLANT
PACK AORTA (CUSTOM PROCEDURE TRAY) ×3 IMPLANT
PAD ARMBOARD 7.5X6 YLW CONV (MISCELLANEOUS) ×6 IMPLANT
PAD ELECT DEFIB RADIOL ZOLL (MISCELLANEOUS) ×3 IMPLANT
PATCH TACHOSII LRG 9.5X4.8 (VASCULAR PRODUCTS) IMPLANT
PERCLOSE PROGLIDE 6F (VASCULAR PRODUCTS)
SET MICROPUNCTURE 5F STIFF (MISCELLANEOUS) ×1 IMPLANT
SHEATH AVANTI 11CM 8FR (MISCELLANEOUS) ×1 IMPLANT
SHEATH PINNACLE 6F 10CM (SHEATH) ×9 IMPLANT
SLEEVE REPOSITIONING LENGTH 30 (MISCELLANEOUS) ×3 IMPLANT
SPONGE LAP 4X18 X RAY DECT (DISPOSABLE) ×3 IMPLANT
STOPCOCK MORSE 400PSI 3WAY (MISCELLANEOUS) ×18 IMPLANT
SUT ETHIBOND X763 2 0 SH 1 (SUTURE) IMPLANT
SUT GORETEX CV 4 TH 22 36 (SUTURE) ×2 IMPLANT
SUT GORETEX CV4 TH-18 (SUTURE) IMPLANT
SUT GORETEX TH-18 36 INCH (SUTURE) ×4 IMPLANT
SUT MNCRL AB 3-0 PS2 18 (SUTURE) IMPLANT
SUT PROLENE 3 0 SH1 36 (SUTURE) IMPLANT
SUT PROLENE 4 0 RB 1 (SUTURE)
SUT PROLENE 4-0 RB1 .5 CRCL 36 (SUTURE) IMPLANT
SUT PROLENE 5 0 C 1 36 (SUTURE) IMPLANT
SUT PROLENE 6 0 C 1 30 (SUTURE) IMPLANT
SUT SILK  1 MH (SUTURE) ×1
SUT SILK 1 MH (SUTURE) ×2 IMPLANT
SUT SILK 2 0 SH CR/8 (SUTURE) IMPLANT
SUT VIC AB 2-0 CT1 27 (SUTURE)
SUT VIC AB 2-0 CT1 TAPERPNT 27 (SUTURE) IMPLANT
SUT VIC AB 2-0 CTX 36 (SUTURE) IMPLANT
SUT VIC AB 3-0 SH 8-18 (SUTURE) ×3 IMPLANT
SYR 10ML LL (SYRINGE) ×9 IMPLANT
SYR 30ML LL (SYRINGE) ×6 IMPLANT
SYR 50ML LL SCALE MARK (SYRINGE) ×3 IMPLANT
SYRINGE 20CC LL (MISCELLANEOUS) ×4 IMPLANT
TOWEL OR 17X26 10 PK STRL BLUE (TOWEL DISPOSABLE) ×6 IMPLANT
TRANSDUCER W/STOPCOCK (MISCELLANEOUS) ×6 IMPLANT
TRAY FOLEY SILVER 14FR TEMP (SET/KITS/TRAYS/PACK) ×3 IMPLANT
TUBE SUCT INTRACARD DLP 20F (MISCELLANEOUS) IMPLANT
TUBING HIGH PRESSURE 120CM (CONNECTOR) ×3 IMPLANT
VALVE HRT TRANSCATH CERT 26MM (Valve) ×2 IMPLANT
WIRE AMPLATZ SS-J .035X180CM (WIRE) ×1 IMPLANT
WIRE BENTSON .035X145CM (WIRE) ×1 IMPLANT

## 2016-12-07 NOTE — Progress Notes (Signed)
Patient's blood sugar checked by nurse tech on arrival and was 58.  Patient is not a diabetic and is asymptomatic.  Dr. Glennon Mac notified and made aware.

## 2016-12-07 NOTE — Transfer of Care (Signed)
Immediate Anesthesia Transfer of Care Note  Patient: Nilsa Nutting  Procedure(s) Performed: Possible, TRANSCATHETER AORTIC VALVE REPLACEMENT,CAROTID (N/A ) TRANSESOPHAGEAL ECHOCARDIOGRAM (TEE) (N/A )  Patient Location: SICU  Anesthesia Type:General  Level of Consciousness: awake, alert , oriented and patient cooperative  Airway & Oxygen Therapy: Patient Spontanous Breathing and Patient connected to nasal cannula oxygen  Post-op Assessment: Report given to RN, Post -op Vital signs reviewed and stable, Patient moving all extremities X 4 and Patient able to stick tongue midline  Post vital signs: Reviewed and stable  Last Vitals:  Vitals:   12/07/16 1308 12/07/16 1309  BP:    Pulse: 72 73  Resp: 15 15  Temp:    SpO2: 99% 99%    Last Pain:  Vitals:   12/07/16 1154  TempSrc: Oral      Patients Stated Pain Goal: 3 (00/51/10 2111)  Complications: No apparent anesthesia complications

## 2016-12-07 NOTE — Anesthesia Procedure Notes (Signed)
Procedure Name: Intubation Date/Time: 12/07/2016 2:24 PM Performed by: Rebekah Chesterfield L Pre-anesthesia Checklist: Patient identified, Emergency Drugs available, Suction available and Patient being monitored Patient Re-evaluated:Patient Re-evaluated prior to induction Oxygen Delivery Method: Circle System Utilized Preoxygenation: Pre-oxygenation with 100% oxygen Induction Type: IV induction Ventilation: Mask ventilation without difficulty Laryngoscope Size: Mac and 3 Grade View: Grade I Tube type: Oral Tube size: 7.5 mm Number of attempts: 1 Airway Equipment and Method: Stylet Placement Confirmation: ETT inserted through vocal cords under direct vision,  positive ETCO2 and breath sounds checked- equal and bilateral Secured at: 21 cm Tube secured with: Tape Dental Injury: Teeth and Oropharynx as per pre-operative assessment

## 2016-12-07 NOTE — Progress Notes (Signed)
Patient presented to short stay with a skin tear on left lower leg and patient states that she received it last night from a laundry basket.  Wound was still bleeding upon patients arrival to short stay.  Paged Dr. Roxy Manns to look at wound.  Instructions from Dr. Roxy Manns to clean and apply a non stick dressing and cover.

## 2016-12-07 NOTE — Interval H&P Note (Signed)
History and Physical Interval Note:  12/07/2016 11:15 AM  Becky Mccall  has presented today for surgery, with the diagnosis of severe aortic stenosis  The various methods of treatment have been discussed with the patient and family. After consideration of risks, benefits and other options for treatment, the patient has consented to  Procedure(s): TRANSCATHETER AORTIC VALVE REPLACEMENT, TRANSFEMORAL (N/A) Possible, TRANSCATHETER AORTIC VALVE REPLACEMENT,CAROTID (N/A) Possible, TRANSCATHETER AORTIC VALVE REPLACEMENT,SUBCLAVIAN (N/A) TRANSESOPHAGEAL ECHOCARDIOGRAM (TEE) (N/A) as a surgical intervention .  The patient's history has been reviewed, patient examined, no change in status, stable for surgery.  I have reviewed the patient's chart and labs.  Questions were answered to the patient's satisfaction.     Rexene Alberts

## 2016-12-07 NOTE — Anesthesia Postprocedure Evaluation (Signed)
Anesthesia Post Note  Patient: Becky Mccall  Procedure(s) Performed: Possible, TRANSCATHETER AORTIC VALVE REPLACEMENT,CAROTID (N/A ) TRANSESOPHAGEAL ECHOCARDIOGRAM (TEE) (N/A )     Patient location during evaluation: SICU Anesthesia Type: General Level of consciousness: awake and alert, oriented and patient cooperative Pain management: pain level controlled Vital Signs Assessment: post-procedure vital signs reviewed and stable Respiratory status: spontaneous breathing, nonlabored ventilation, respiratory function stable and patient connected to nasal cannula oxygen Cardiovascular status: blood pressure returned to baseline and stable Postop Assessment: no apparent nausea or vomiting Anesthetic complications: no    Last Vitals:  Vitals:   12/07/16 1711 12/07/16 1718  BP:    Pulse: 71   Resp: 13   Temp:  36.6 C  SpO2: 99%     Last Pain:  Vitals:   12/07/16 1718  TempSrc: Oral                 Taysia Rivere,E. Chrishawna Farina

## 2016-12-07 NOTE — Anesthesia Procedure Notes (Signed)
Arterial Line Insertion Performed by: Annye Asa, anesthesiologist  Preanesthetic checklist: patient identified, IV checked, risks and benefits discussed, surgical consent, monitors and equipment checked, pre-op evaluation, timeout performed and anesthesia consent Lidocaine 1% used for infiltration Right, radial was placed Catheter size: 20 G Hand hygiene performed  and maximum sterile barriers used   Attempts: 2 (after several attempts by CRNA) Procedure performed without using ultrasound guided technique. Following insertion, dressing applied and Biopatch. Post procedure assessment: normal  Patient tolerated the procedure well with no immediate complications.

## 2016-12-07 NOTE — Op Note (Signed)
CARDIOTHORACIC SURGERY OPERATIVE NOTE  Date of Procedure:  12/07/2016  Preoperative Diagnosis: Severe Aortic Stenosis   Postoperative Diagnosis: Same   Procedure:    Transcatheter Aortic Valve Replacement - Open Left Transcarotid Approach  Edwards Sapien 3 THV (size 26 mm, model # 9600TFX, serial # [id number])   Co-Surgeons:  Valentina Gu. Roxy Manns, MD and [name], MD  Anesthesiologist:  Midge Minium, MD  Echocardiographer:  Ena Dawley  Pre-operative Echo Findings:  Severe aortic stenosis  Normal left ventricular systolic function  Post-operative Echo Findings:  Mild paravalvular leak  Normal left ventricular systolic function   BRIEF CLINICAL NOTE AND INDICATIONS FOR SURGERY  Patient is an 83 year old recently widowed white female with history of aortic stenosis, hypertension, chronic diastolic congestive heart failure, COPD, hypothyroidism, GE reflux, and hyperlipidemia who has been referred for second surgical opinion to discuss treatment options for management of severe symptomatic aortic stenosis. Patient was initially diagnosed with congestive heart failure several years ago by her primary care physician. She was noted to complain of some exertional shortness breath and lower extremity edema with chronic vertigo. Chest x-ray at that time suggested mild congestive heart failure. Echocardiogram revealed normal left ventricular systolic function with what was felt to be mild to moderate aortic stenosis. She was evaluated by Dr. [name] lost to follow-up for several years because of patient did not return for appointments.  During this period time the patient's husband became ill and ultimately passed away following a bout with cancer last spring.  She has become progressively less mobile because of pain in her left knee related to degenerative arthritis. She returned to see Dr. [name] in early September at which time she admitted to symptoms of chronic exertional  shortness of breath with occasional chest tightness and dizzy spells. Follow-up echocardiogram revealed severe aortic stenosis with preserved left ventricular systolic function. The patient underwent diagnostic cardiac catheterization 11/15/2016 revealing mild nonobstructive coronary artery disease. CT angiography was performed and the patient was referred for elective surgical consultation. She was evaluated by Dr. [name] and felt the patient would be at least moderate risk for conventional surgical aortic valve replacement. The patient does have severe atherosclerotic disease and may not have adequate pelvic vascular access for a transfemoral approach for transcatheter aortic valve replacement. A second surgical consultation was requested.  Following the decision to proceed with transcatheter aortic valve replacement, a discussion has been held regarding what types of management strategies would be attempted intraoperatively in the event of life-threatening complications, including whether or not the patient would be considered a candidate for the use of cardiopulmonary bypass and/or conversion to open sternotomy for attempted surgical intervention.  The patient has been advised of a variety of complications that might develop peculiar to this approach including but not limited to risks of death, stroke, paravalvular leak, aortic dissection or other major vascular complications, aortic annulus rupture, device embolization, cardiac rupture or perforation, acute myocardial infarction, arrhythmia, heart block or bradycardia requiring permanent pacemaker placement, congestive heart failure, respiratory failure, renal failure, pneumonia, infection, other late complications related to structural valve deterioration or migration, or other complications that might ultimately cause a temporary or permanent loss of functional independence or other long term morbidity.  The patient provides full informed consent for the  procedure as described and all questions were answered preoperatively.    DETAILS OF THE OPERATIVE PROCEDURE  PREPARATION:    The patient is brought to the operating room on the above mentioned date and central monitoring  was established by the anesthesia team including placement of Swan-Ganz catheter and radial arterial line. The patient is placed in the supine position on the operating table.  Intravenous antibiotics are administered. General endotracheal anesthesia is induced uneventfully. A Foley catheter is placed.  Transcutaneous cerebral oximetry is monitored throughout the procedure.  Baseline transesophageal echocardiogram was performed.  Findings were notable for severe aortic stenosis with normal left ventricular systolic function.  The patient's chest, abdomen, both groins, and both lower extremities are prepared and draped in a sterile manner. A time out procedure is performed.   PERIPHERAL ACCESS:    Femoral arterial and venous access is obtained with placement of 6 Fr arterial and venous sheaths on the right side.  A pigtail diagnostic catheter is passed through the right arterial sheath under fluoroscopic guidance into the aortic root.  A temporary transvenous pacemaker catheter is passed through the right femoral venous sheath under fluoroscopic guidance into the right ventricle.  The pacemaker is tested to ensure stable lead placement and pacemaker capture.  Aortic root angiography is performed to ascertain appropriate fluoroscopic angle for valve deployment.   TRANSCAROTID ACCESS:   A 2 cm transverse cervical incision is made 2 finger breadths above the sternal notch on the left neck. The incision is completed through the platysma muscle with electrocautery. The anterior border of the sternocleidomastoid muscle is identified and mobilized. Using sharp dissection only the proximal left common carotid artery is gently dissected away from associated structures. Vessel loop was  placed around the carotid artery. 2 concentric pursestring sutures using CV 4 Gore-Tex are placed on the anterior surface of the proximal left common carotid artery.  The patient is heparinized systemically and ACT verified > 250 seconds.  The left common carotid artery is punctured using an 18 gauge needle and a guidewire advanced under fluoroscopic guidance into the ascending aorta. A 6 Fr sheath is placed over the guidewire into the carotid artery.  A short angled tip directional catheters passed through the sheath into the proximal aorta. A straight tip guidewire is passed through the directional catheter and advanced across the aortic valve into the left ventricle.The directional catheter is removed and replaced for a pigtail catheter.  An Amplatz extra stiff guidewire is passed through the pigtail catheter and positioned into the left ventricle.  An Dimmit introducer sheath is passed over the guidewire into the aorta.  The sheath position is continuously monitored and secured by the surgical assistant.     TRANSCATHETER HEART VALVE DEPLOYMENT:  An Edwards Sapien 3 transcatheter heart valve (size 26 mm, model # 9600TFX, serial [id number]) is prepared and crimped per manufacturer's guidelines, and the proper orientation of the valve is confirmed on the EdwardsCertitude delivery system.  The delivery system loader is advanced into the introducing sheath.  The valve and delivery system are advanced through the loader into the sheath and all air is evacuated.  The valve and balloon are advanced into the proximal aorta and part way through the aortic valve.  The valve is then finely positioned in the aortic valve and its position verified using an aortogram while temporarily holding ventilation and rapid pacing.  Valve position is also confirmed using TEE, and care is taken to make certain there is no sign of entanglement in the mitral apparatus.  Once final position of the valve has been  confirmed, the valve is deployed while temporarily holding ventilation and during rapid ventricular pacing to maintain systolic blood pressure < 50 mmHg  and pulse pressure < 10 mmHg.  The balloon inflation is held for >3 seconds after reaching full deployment volume.  Once the balloon has fully deflated the balloon is retracted into the left ventricle and valve function is assessed using TEE.   There is felt to be mild paravalvular leak and no central aortic insufficiency.  Left ventricular function is unchanged from preoperatively.  There is trivial mitral regurgitation.  The patient's hemodynamic recovery following valve deployment is rapid and uneventful.  The deployment balloon and guidewire are both removed.   PROCEDURE COMPLETION:  The carotid sheath is removed and pursestring sutures secured.  Protamine is administered.    Once hemostasis has been ascertained, the cervical incision is closed in layers and the skin incision closed using a subcuticular skin closure.   The temporary pacemaker, pigtail catheters and all femoral sheaths are removed.  The patient tolerated the procedure well and is transported to the surgical intensive care in stable condition. There are no intraoperative complications. All sponge instrument and needle counts are verified correct at completion of the operation.  No blood products were administered during the operation.  The patient received a total of 80.8 mL of intravenous contrast during the procedure.    Valentina Gu. Roxy Manns MD 12/07/2016 4:42 PM

## 2016-12-07 NOTE — Op Note (Signed)
  [facility]   MULTIDISCIPLINARY HEART VALVE TEAM   TAVR OPERATIVE NOTE   Date of Procedure:  12/07/2016  Preoperative Diagnosis: Severe Aortic Stenosis   Postoperative Diagnosis: Same   Procedure:  Transcatheter Aortic Valve Replacement - Left carotid approach  Edwards Sapien 3 THV (size 26 mm, model # 9600TFX, serial # [id number])   Co-Surgeons:  Valentina Gu. Roxy Manns, MD and [name], MD  Anesthesiologist:  Dr Glennon Mac  Echocardiographer:  Dr Meda Coffee  Pre-operative Echo Findings:  Severe severe aortic stenosis  Normal left ventricular systolic function  Post-operative Echo Findings:  Trace paravalvular leak   Normal left ventricular systolic function  DETAILS OF THE OPERATIVE PROCEDURE PERIPHERAL ACCESS:   Using ultrasound guidance, femoral arterial and venous access is obtained with placement of 6 Fr sheaths on the right side.  A pigtail diagnostic catheter was passed through the femoral arterial sheath under fluoroscopic guidance into the aortic root.  A temporary transvenous pacemaker catheter was passed through the femoral venous sheath under fluoroscopic guidance into the right ventricle.  The pacemaker was tested to ensure stable lead placement and pacemaker capture. Aortic root angiography was performed in order to determine the optimal angiographic angle for valve deployment.  PROCEDURE COMPLETION:  The temporary pacemaker, pigtail catheters and femoral sheaths were removed with manual pressure used for hemostasis.   The patient tolerated the procedure well and is transported to the surgical intensive care in stable condition. There were no immediate intraoperative complications. All sponge instrument and needle counts are verified correct at completion of the operation.   The patient received a total of 81 mL of intravenous contrast during the procedure.  Please see complete operative note of Dr Roxy Manns for full history, indication, and details of the  operative procedure.   [name], MD 12/07/2016 6:06 PM

## 2016-12-07 NOTE — Interval H&P Note (Signed)
History and Physical Interval Note:  12/07/2016 2:02 PM  Becky Mccall  has presented today for surgery, with the diagnosis of severe aortic stenosis  The various methods of treatment have been discussed with the patient and family. After consideration of risks, benefits and other options for treatment, the patient has consented to  Procedure(s): TRANSCATHETER AORTIC VALVE REPLACEMENT, TRANSFEMORAL (N/A) Possible, TRANSCATHETER AORTIC VALVE REPLACEMENT,CAROTID (N/A) Possible, TRANSCATHETER AORTIC VALVE REPLACEMENT,SUBCLAVIAN (N/A) TRANSESOPHAGEAL ECHOCARDIOGRAM (TEE) (N/A) as a surgical intervention .  The patient's history has been reviewed, patient examined, no change in status, stable for surgery.  I have reviewed the patient's chart and labs.  Questions were answered to the patient's satisfaction.     [name]

## 2016-12-07 NOTE — Anesthesia Preprocedure Evaluation (Addendum)
Anesthesia Evaluation  Patient identified by MRN, date of birth, ID band Patient awake    Reviewed: Allergy & Precautions, NPO status , Patient's Chart, lab work & pertinent test results  History of Anesthesia Complications Negative for: history of anesthetic complications  Airway Mallampati: II  TM Distance: >3 FB Neck ROM: Full    Dental  (+) Edentulous Upper, Edentulous Lower   Pulmonary COPD, former smoker,    breath sounds clear to auscultation       Cardiovascular hypertension, Pt. on medications (-) angina+ CAD (non-obstructive by cath)  Valvular problems/murmurs:  severe AS with mean gradient 40.83mmHg, AVA 0.76cm2.  AS  Rhythm:Regular Rate:Normal  9/18 ECHO: normal LVF,  Severe Aortic stenosis. Peak velocity (S): 407 cm/s. Mean gradient (S): 39 mm Hg. Valve area (VTI): 0.66 cm^2. Valve area (Vmax): 0.72 cm^2. Mild MR    Neuro/Psych DDD TIA   GI/Hepatic Neg liver ROS, GERD  Controlled,  Endo/Other  Hypothyroidism   Renal/GU Renal InsufficiencyRenal disease     Musculoskeletal  (+) Arthritis , Osteoarthritis,    Abdominal   Peds  Hematology negative hematology ROS (+)   Anesthesia Other Findings   Reproductive/Obstetrics                           Anesthesia Physical Anesthesia Plan  ASA: III  Anesthesia Plan: General   Post-op Pain Management:    Induction: Intravenous  PONV Risk Score and Plan: 3 and Ondansetron, Dexamethasone and Midazolam  Airway Management Planned: Oral ETT  Additional Equipment: Arterial line, CVP and Ultrasound Guidance Line Placement  Intra-op Plan:   Post-operative Plan: Extubation in OR  Informed Consent: I have reviewed the patients History and Physical, chart, labs and discussed the procedure including the risks, benefits and alternatives for the proposed anesthesia with the patient or authorized representative who has indicated his/her  understanding and acceptance.   Dental advisory given  Plan Discussed with: CRNA and Surgeon  Anesthesia Plan Comments: (Plan routine monitors, A line, Central line, GETA with TEE)        Anesthesia Quick Evaluation

## 2016-12-07 NOTE — Progress Notes (Addendum)
[facility] BRIEF SICU PROGRESS NOTE  Day of Surgery  S/P Procedure(s) (LRB): Possible, TRANSCATHETER AORTIC VALVE REPLACEMENT,CAROTID (N/A) TRANSESOPHAGEAL ECHOCARDIOGRAM (TEE) (N/A)   Sleepy but easily arousable, follows commands and moves all 4 extremities, tongue midline.  Reports mild soreness in neck NSR w/ stable BP O2 sats 99% Neck soft, no hematoma  Plan: Continue routine early post TAVR  Rexene Alberts, MD 12/07/2016 5:25 PM

## 2016-12-07 NOTE — Anesthesia Procedure Notes (Signed)
Central Venous Catheter Insertion Performed by: Annye Asa, anesthesiologist Start/End10/16/2018 1:27 PM, 12/07/2016 1:34 PM Patient location: Pre-op. Preanesthetic checklist: patient identified, IV checked, risks and benefits discussed, surgical consent, monitors and equipment checked, pre-op evaluation, timeout performed and anesthesia consent Position: supine Lidocaine 1% used for infiltration and patient sedated Hand hygiene performed , maximum sterile barriers used  and Seldinger technique used Catheter size: 8 Fr Total catheter length 16. Central line was placed.Double lumen Procedure performed using ultrasound guided technique. Ultrasound Notes:anatomy identified, needle tip was noted to be adjacent to the nerve/plexus identified, no ultrasound evidence of intravascular and/or intraneural injection and image(s) printed for medical record Attempts: 1 Following insertion, line sutured, dressing applied and Biopatch. Post procedure assessment: free fluid flow, blood return through all ports and no air  Patient tolerated the procedure well with no immediate complications. Additional procedure comments: CVP: Timeout, sterile prep, drape, FBP R neck.  Trendelenburg position.  1% lido local, finder and trocar RIJ 1st pass with US guidance.  2 lumen placed over J wire. Biopatch and sterile dressing on.  Patient tolerated well.  VSS.  Jenita Seashore, MD.

## 2016-12-07 NOTE — H&P (View-Only) (Signed)
[facility]  MULTIDISCIPLINARY HEART VALVE CLINIC  CARDIOTHORACIC SURGERY CONSULTATION REPORT  Referring Provider is [name], MD PCP is [name], MD  Chief Complaint  Patient presents with  . TAVR Evaluation #2    HPI:  Patient is an 83 year old recently widowed white female with history of aortic stenosis, hypertension, chronic diastolic congestive heart failure, COPD, hypothyroidism, GE reflux, and hyperlipidemia who has been referred for second surgical opinion to discuss treatment options for management of severe symptomatic aortic stenosis. Patient was initially diagnosed with congestive heart failure several years ago by her primary care physician. She was noted to complain of some exertional shortness breath and lower extremity edema with chronic vertigo. Chest x-ray at that time suggested mild congestive heart failure. Echocardiogram revealed normal left ventricular systolic function with what was felt to be mild to moderate aortic stenosis. She was evaluated by Dr. [name] lost to follow-up for several years because of patient did not return for appointments.  During this period time the patient's husband became ill and ultimately passed away following a bout with cancer last spring.  She has become progressively less mobile because of pain in her left knee related to degenerative arthritis. She returned to see Dr. [name] in early September at which time she admitted to symptoms of chronic exertional shortness of breath with occasional chest tightness and dizzy spells. Follow-up echocardiogram revealed severe aortic stenosis with preserved left ventricular systolic function. The patient underwent diagnostic cardiac catheterization 11/15/2016 revealing mild nonobstructive coronary artery disease. CT angiography was performed and the patient was referred for elective surgical consultation. She was evaluated by Dr. [name] and felt the patient would be at least  moderate risk for conventional surgical aortic valve replacement. The patient does have severe atherosclerotic disease and may not have adequate pelvic vascular access for a transfemoral approach for transcatheter aortic valve replacement. A second surgical consultation was requested.  The patient is widowed and lives locally in [place] by herself. She has 2 adult children. Her daughter is very supportive and accompanies her for her office consultation visit today. 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 She has remained functionally independent and drives an automobile. She admits to a long history of symptoms of exertional shortness of breath and chest tightness. She gets tired easily. She sometimes gets dizzy, particularly when she has been working out in her garden. She denies any symptoms of resting shortness of breath, PND, orthopnea, or lower extremity edema. She has occasional tightness across her chest usually associated with activity. She recently had her left knee injected and at present her pain in her knee has been much improved. She is able to ambulate without use of a cane or other mechanical assistance.  She does get some pain in both calf with ambulation, more so on the left than the right.  Past Medical History:  Diagnosis Date  . Abrasion of left lower extremity 12/2012   draining wound on LLL;  . Anemia    a. Hgb 8.4 (2015), previously 13.4 in 2014.  . Arthritis    OA of hands and knees  . CAD in native artery    a.  [facility] 11/15/16 showing normal LVEDP, mild nonobstructive CAD (30% prox RCA, 40% ostial RPDA, 45% D1, 45% dLAD), severe AS with mean gradient 40.18mHg, AVA 0.76cm2.   . Carotid stenosis    1-39% bilateral in 2015  . Cervical spondylolysis   . CKD (chronic  kidney disease), stage III (HCC)   . Colon polyp   . COPD (chronic obstructive pulmonary disease) (HCC)    Spiriva inhaler daily  . DDD (degenerative disc  disease), lumbar   . Fibrocystic breast disease   . GERD (gastroesophageal reflux disease)   . Hard of hearing    wears hearing aides both ears  . Hyperlipidemia    can't take the meds so takes Fish Oil  . Hypertension   . Hypothyroidism   . IBS (irritable bowel syndrome)   . Intermittent dysphagia    for pills and solids  . OSA (obstructive sleep apnea)   . Osteopenia   . Severe aortic stenosis   . Squamous cell skin cancer   . TIA (transient ischemic attack)   . Vitamin D deficiency     Past Surgical History:  Procedure Laterality Date  . ABDOMINAL HYSTERECTOMY    . BACK SURGERY  2009  . CATARACT EXTRACTION W/ INTRAOCULAR LENS  IMPLANT, BILATERAL  1999,2000  . EYE SURGERY    . HEMORROIDECTOMY    . LUMBAR LAMINECTOMY  1999  . RIGHT/LEFT HEART CATH AND CORONARY ANGIOGRAPHY N/A 11/15/2016   Procedure: RIGHT/LEFT HEART CATH AND CORONARY ANGIOGRAPHY;  Surgeon: Marykay Lex, MD;  Location: [facility] INVASIVE CV LAB;  Service: Cardiovascular;  Laterality: N/A;    Family History  Problem Relation Age of Onset  . Cancer - Lung Mother   . Heart attack Father   . Cancer - Lung Brother   . Cancer - Lung Maternal Aunt     Social History   Social History  . Marital status: Widowed    Spouse name: irvin  . Number of children: 2  . Years of education: 10th   Occupational History  . retired    Social History Main Topics  . Smoking status: Former Smoker    Packs/day: 3.00    Years: 40.00    Types: Cigarettes  . Smokeless tobacco: Never Used     Comment: quit in June 11,1999  . Alcohol use 3.5 oz/week    7 Standard drinks or equivalent per week  . Drug use: No  . Sexual activity: No   Other Topics Concern  . Not on file   Social History Narrative   Patient lives at home with her husband   Patient drinks coffee, sodas   Patient has 2 children    Patient is right handed    Patient is retired          Current Outpatient Prescriptions  Medication Sig Dispense  Refill  . amLODipine (NORVASC) 2.5 MG tablet Take 1 tablet (2.5 mg total) by mouth daily. 90 tablet 1  . aspirin EC 81 MG tablet Take 1 tablet (81 mg total) by mouth daily. 90 tablet 3  . Calcium Carb-Cholecalciferol (CALCIUM 1000 + D PO) Take 1 tablet by mouth daily.     . Cholecalciferol (VITAMIN D) 2000 UNITS tablet Take 6,000 Units by mouth daily.     . Cinnamon 500 MG capsule Take 500 mg by mouth 2 (two) times daily.     Marland Kitchen doxylamine, Sleep, (SLEEP AID) 25 MG tablet Take 50 mg by mouth at bedtime.    Marland Kitchen levothyroxine (SYNTHROID, LEVOTHROID) 137 MCG tablet Take 137 mcg by mouth daily before breakfast.    . Magnesium 500 MG TABS Take 1,000 mg by mouth at bedtime.     . meclizine (ANTIVERT) 25 MG tablet Take 12.5-25 mg by mouth every 6 (six) hours as needed  for dizziness.    . Multiple Vitamin (MULTIVITAMIN WITH MINERALS) TABS tablet Take 1 tablet by mouth daily.    Marland Kitchen omega-3 acid ethyl esters (LOVAZA) 1 g capsule Take 1 g by mouth 3 (three) times daily.    . traMADol (ULTRAM) 50 MG tablet Take 1 tablet (50 mg total) by mouth every 8 (eight) hours as needed (pain not relieved by tylenol). 45 tablet 0  . Turmeric 500 MG CAPS Take 500 mg by mouth 2 (two) times daily.    . vitamin B-12 (CYANOCOBALAMIN) 1000 MCG tablet Take 1,000 mcg by mouth daily.    . vitamin C (ASCORBIC ACID) 500 MG tablet Take 500 mg by mouth daily.    . vitamin E 400 UNIT capsule Take 400 Units by mouth 2 (two) times daily.      No current facility-administered medications for this visit.     Allergies  Allergen Reactions  . Lisinopril Cough  . Relafen [Nabumetone] Other (See Comments)    Unsure of exact reaction.  . Statins     Muscle pain  . Tape     Developed blisters from clear tape used over cath site 10/2016  . Welchol [Colesevelam] Other (See Comments)    Muscle pain  . Zetia [Ezetimibe] Other (See Comments)    Muscle pain.  . Neosporin [Neomycin-Bacitracin Zn-Polymyx] Other (See Comments)    Cause  wound/scratch to worsen  . Zinc Oxide Rash      Review of Systems:   General:  normal appetite, decreased energy, no weight gain, no weight loss, no fever  Cardiac:  + chest pain with exertion, no chest pain at rest, + SOB with exertion, no resting SOB, no PND, no orthopnea, + palpitations, no arrhythmia, no atrial fibrillation, no LE edema, + dizzy spells, no syncope  Respiratory:  + shortness of breath, no home oxygen, no productive cough, no dry cough, no bronchitis, no wheezing, no hemoptysis, no asthma, no pain with inspiration or cough, no sleep apnea, no CPAP at night  GI:   no difficulty swallowing, + reflux, no frequent heartburn, no hiatal hernia, no abdominal pain, no constipation, no diarrhea, no hematochezia, no hematemesis, no melena  GU:   no dysuria,  + frequency, no urinary tract infection, no hematuria, no kidney stones, no kidney disease  Vascular:  + pain suggestive of claudication, no pain in feet, no leg cramps, no varicose veins, no DVT, no non-healing foot ulcer  Neuro:   no stroke, no TIA's, no seizures, no headaches, no temporary blindness one eye,  no slurred speech, + peripheral neuropathy, no chronic pain, no instability of gait, no memory/cognitive dysfunction  Musculoskeletal: + arthritis, + joint swelling, no myalgias, + difficulty walking, moderately reduced mobility   Skin:   no rash, no itching, no skin infections, no pressure sores or ulcerations  Psych:   + anxiety, no depression, no nervousness, no unusual recent stress  Eyes:   no blurry vision, no floaters, no recent vision changes, + wears glasses or contacts  ENT:   + hearing loss, no loose or painful teeth, + full dentures  Hematologic:  + easy bruising, no abnormal bleeding, no clotting disorder, no frequent epistaxis  Endocrine:  no diabetes, does not check CBG's at home        Physical Exam:   BP (!) 141/74   Pulse 72   Ht '5\' 6"'$  (1.676 m)   Wt 165 lb (74.8 kg)   LMP  (LMP Unknown)   SpO2  96%   BMI 26.63 kg/m   General:  Elderly but o/w well-appearing  HEENT:  Unremarkable   Neck:   no JVD, no bruits, no adenopathy   Chest:   clear to auscultation, symmetrical breath sounds, no wheezes, no rhonchi   CV:   RRR, grade III/VI crescendo/decrescendo murmur heard best at RSB,  no diastolic murmur  Abdomen:  soft, non-tender, no masses   Extremities:  warm, well-perfused, pulses diminished - not palpable left groin, no LE edema  Rectal/GU  Deferred  Neuro:   Grossly non-focal and symmetrical throughout  Skin:   Clean and dry, no rashes, no breakdown   Diagnostic Tests:  Transthoracic Echocardiography  Patient:    Marilouise, Densmore MR #:       [id number] Study Date: 11/01/2016 Gender:     F Age:        56 Height:     167.6 cm Weight:     74.8 kg BSA:        1.88 m^2 Pt. Status: Room:   ORDERING     Armanda Magic, MD  REFERRING    Armanda Magic, MD  SONOGRAPHER  Clearence Ped, RCS  PERFORMING   [facility], Outpatient  ATTENDING    Chilton Si, MD  cc:  -------------------------------------------------------------------  ------------------------------------------------------------------- Indications:      Aortic Stenosis (I35.0).  ------------------------------------------------------------------- History:   PMH:  HLD.  Dyspnea.  Chronic obstructive pulmonary disease.  Risk factors:  Hypertension.  ------------------------------------------------------------------- Study Conclusions  - Left ventricle: The cavity size was normal. There was moderate   concentric hypertrophy. Systolic function was normal. Wall motion   was normal; there were no regional wall motion abnormalities.   Doppler parameters are consistent with abnormal left ventricular   relaxation (grade 1 diastolic dysfunction). Doppler parameters   are consistent with high ventricular filling pressure. - Aortic valve: Valve mobility was restricted. There was severe   stenosis. Peak  velocity (S): 407 cm/s. Mean gradient (S): 39 mm   Hg. Valve area (VTI): 0.66 cm^2. Valve area (Vmax): 0.72 cm^2.   Valve area (Vmean): 0.63 cm^2. - Mitral valve: Calcified annulus. Mildly thickened, mildly   calcified leaflets . Transvalvular velocity was within the normal   range. There was no evidence for stenosis. There was mild   regurgitation. Valve area by continuity equation (using LVOT   flow): 1.98 cm^2. - Left atrium: The atrium was severely dilated. - Right ventricle: The cavity size was normal. Wall thickness was   normal. Systolic function was normal. - Tricuspid valve: There was mild regurgitation. - Pulmonary arteries: Systolic pressure was within the normal   range. PA peak pressure: 15 mm Hg (S).  ------------------------------------------------------------------- Study data:  Comparison was made to the study of 10/08/2013.  Study status:  Routine.  Procedure:  The patient reported no pain pre or post test. Transthoracic echocardiography. Image quality was adequate.          Transthoracic echocardiography.  M-mode, complete 2D, spectral Doppler, and color Doppler.  Birthdate: Patient birthdate: Aug 05, 1929.  Age:  Patient is 83 yr old.  Sex: Gender: female.    BMI: 26.6 kg/m^2.  Patient status:  Outpatient. Study date:  Study date: 11/01/2016. Study time: 09:57 AM. Location:  [facility]  -------------------------------------------------------------------  ------------------------------------------------------------------- Left ventricle:  The cavity size was normal. There was moderate concentric hypertrophy. Systolic function was normal. Wall motion was normal; there were no regional wall motion abnormalities. Doppler parameters are consistent with abnormal left ventricular relaxation (grade  1 diastolic dysfunction). Doppler parameters are consistent with high ventricular filling  pressure.  ------------------------------------------------------------------- Aortic valve:   Trileaflet; moderately thickened, moderately calcified leaflets. Valve mobility was restricted.  Doppler: There was severe stenosis.   There was no regurgitation.    VTI ratio of LVOT to aortic valve: 0.16. Valve area (VTI): 0.66 cm^2. Indexed valve area (VTI): 0.35 cm^2/m^2. Peak velocity ratio of LVOT to aortic valve: 0.17. Valve area (Vmax): 0.72 cm^2. Indexed valve area (Vmax): 0.38 cm^2/m^2. Mean velocity ratio of LVOT to aortic valve: 0.15. Valve area (Vmean): 0.63 cm^2. Indexed valve area (Vmean): 0.33 cm^2/m^2.    Mean gradient (S): 39 mm Hg. Peak gradient (S): 66 mm Hg.  ------------------------------------------------------------------- Aorta:  Aortic root: The aortic root was normal in size.  ------------------------------------------------------------------- Mitral valve:   Calcified annulus. Mildly thickened, mildly calcified leaflets . Mobility was not restricted.  Doppler: Transvalvular velocity was within the normal range. There was no evidence for stenosis. There was mild regurgitation.    Valve area by pressure half-time: 3.28 cm^2. Indexed valve area by pressure half-time: 1.74 cm^2/m^2. Valve area by continuity equation (using LVOT flow): 1.98 cm^2. Indexed valve area by continuity equation (using LVOT flow): 1.05 cm^2/m^2.    Mean gradient (D): 3 mm Hg. Peak gradient (D): 4 mm Hg.  ------------------------------------------------------------------- Left atrium:  The atrium was severely dilated.  ------------------------------------------------------------------- Right ventricle:  The cavity size was normal. Wall thickness was normal. Systolic function was normal.  ------------------------------------------------------------------- Pulmonic valve:    Doppler:  Transvalvular velocity was within the normal range. There was no evidence for  stenosis.  ------------------------------------------------------------------- Tricuspid valve:   Structurally normal valve.    Doppler: Transvalvular velocity was within the normal range. There was mild regurgitation.  ------------------------------------------------------------------- Pulmonary artery:   The main pulmonary artery was normal-sized. Systolic pressure was within the normal range.  ------------------------------------------------------------------- Right atrium:  The atrium was normal in size.  ------------------------------------------------------------------- Pericardium:  There was no pericardial effusion.  ------------------------------------------------------------------- Systemic veins: Inferior vena cava: The vessel was normal in size. The respirophasic diameter changes were in the normal range (= 50%), consistent with normal central venous pressure. Diameter: 14 mm.  ------------------------------------------------------------------- Measurements   IVC                                      Value          Reference  ID                                       14    mm       ----------    Left ventricle                           Value          Reference  LV ID, ED, PLAX chordal                  45    mm       43 - 52  LV ID, ES, PLAX chordal                  37    mm       23 - 38  LV fx shortening, PLAX chordal   (L)  18    %        >=29  LV PW thickness, ED                      14    mm       ----------  IVS/LV PW ratio, ED                      0.93           <=1.3  Stroke volume, 2D                        75    ml       ----------  Stroke volume/bsa, 2D                    40    ml/m^2   ----------  LV e&', lateral                           4.79  cm/s     ----------  LV E/e&', lateral                         20.46          ----------  LV e&', medial                            5.66  cm/s     ----------  LV E/e&', medial                           17.31          ----------  LV e&', average                           5.23  cm/s     ----------  LV E/e&', average                         18.76          ----------    Ventricular septum                       Value          Reference  IVS thickness, ED                        13    mm       ----------    LVOT                                     Value          Reference  LVOT ID, S                               23    mm       ----------  LVOT area  4.15  cm^2     ----------  LVOT peak velocity, S                    71    cm/s     ----------  LVOT mean velocity, S                    43.8  cm/s     ----------  LVOT VTI, S                              18.1  cm       ----------    Aortic valve                             Value          Reference  Aortic valve peak velocity, S            407   cm/s     ----------  Aortic valve mean velocity, S            290   cm/s     ----------  Aortic valve VTI, S                      113   cm       ----------  Aortic mean gradient, S                  39    mm Hg    ----------  Aortic peak gradient, S                  66    mm Hg    ----------  VTI ratio, LVOT/AV                       0.16           ----------  Aortic valve area, VTI                   0.66  cm^2     ----------  Aortic valve area/bsa, VTI               0.35  cm^2/m^2 ----------  Velocity ratio, peak, LVOT/AV            0.17           ----------  Aortic valve area, peak velocity         0.72  cm^2     ----------  Aortic valve area/bsa, peak              0.38  cm^2/m^2 ----------  velocity  Velocity ratio, mean, LVOT/AV            0.15           ----------  Aortic valve area, mean velocity         0.63  cm^2     ----------  Aortic valve area/bsa, mean              0.33  cm^2/m^2 ----------  velocity    Aorta                                    Value  Reference  Aortic root ID, ED                       33    mm       ----------    Left atrium                               Value          Reference  LA ID, A-P, ES                           43    mm       ----------  LA ID/bsa, A-P                   (H)     2.28  cm/m^2   <=2.2  LA volume, S                             75    ml       ----------  LA volume/bsa, S                         39.8  ml/m^2   ----------  LA volume, ES, 1-p A4C                   79.8  ml       ----------  LA volume/bsa, ES, 1-p A4C               42.4  ml/m^2   ----------  LA volume, ES, 1-p A2C                   69.4  ml       ----------  LA volume/bsa, ES, 1-p A2C               36.9  ml/m^2   ----------    Mitral valve                             Value          Reference  Mitral E-wave peak velocity              98    cm/s     ----------  Mitral A-wave peak velocity              129   cm/s     ----------  Mitral mean velocity, D                  74.8  cm/s     ----------  Mitral deceleration time                 180   ms       150 - 230  Mitral pressure half-time                81    ms       ----------  Mitral mean gradient, D                  3     mm Hg    ----------  Mitral peak gradient, D  4     mm Hg    ----------  Mitral E/A ratio, peak                   0.8            ----------  Mitral valve area, PHT, DP               3.28  cm^2     ----------  Mitral valve area/bsa, PHT, DP           1.74  cm^2/m^2 ----------  Mitral valve area, LVOT                  1.98  cm^2     ----------  continuity  Mitral valve area/bsa, LVOT              1.05  cm^2/m^2 ----------  continuity  Mitral annulus VTI, D                    37.9  cm       ----------    Pulmonary arteries                       Value          Reference  PA pressure, S, DP                       15    mm Hg    <=30    Tricuspid valve                          Value          Reference  Tricuspid regurg peak velocity           170   cm/s     ----------  Tricuspid peak RV-RA gradient            12    mm Hg    ----------  Tricuspid maximal  regurg                 170   cm/s     ----------  velocity, PISA    Right atrium                             Value          Reference  RA ID, S-I, ES, A4C              (H)     52    mm       34 - 49  RA area, ES, A4C                         18.3  cm^2     8.3 - 19.5  RA volume, ES, A/L                       53.5  ml       ----------  RA volume/bsa, ES, A/L                   28.4  ml/m^2   ----------    Systemic veins  Value          Reference  Estimated CVP                            3     mm Hg    ----------    Right ventricle                          Value          Reference  TAPSE                                    25.7  mm       ----------  RV pressure, S, DP                       15    mm Hg    <=30  RV s&', lateral, S                        13.2  cm/s     ----------  Legend: (L)  and  (H)  mark values outside specified reference range.  ------------------------------------------------------------------- Prepared and Electronically Authenticated by  Chilton Si, MD 2018-09-10T13:22:39   RIGHT/LEFT HEART CATH AND CORONARY ANGIOGRAPHY  Conclusion     LV end diastolic pressure is normal.  Prox RCA lesion, 30 %stenosed. Ost RPDA lesion, 40 %stenosed.  1st Diag lesion, 45 %stenosed. Dist LAD lesion, 45 %stenosed.  There is severe aortic valve stenosis. Mean Gradient 40.5 mmHg, AVA ~0.76 cm   Severe aortic stenosis by mean gradient. Mild nonobstructive CAD.  Plan:  The patient will be return to short stay holding area for monitoring and TR band removal once the brachial sheath has been pulled.  She'll be discharged from short stay to follow-up with Dr. Lester McCracken, M.D., M.S. Interventional Cardiologist   Pager # [phone] Phone # [phone] [address] [place], [place] [address]    Indications   Severe aortic stenosis by prior echocardiography [I35.0 (ICD-10-CM)]  SOB (shortness of  breath) [R06.02 (ICD-10-CM)]  Procedural Details/Technique   Technical Details PCP: Laurann Montana, MD  Cardiologist: Dr. Mayford Knife Referring Provider: Ronie Spies, PA-C  SKYELYNN RAMBEAU is a 83 y.o. female with history of COPD, AS, carotid artery disease (1-39% 04/2013), hard of hearing, hyperlipidemia, TIA, HTN, hypothyroidism, GERD, ?CKD (updated stage unknown), significant anemia 04/2013 who recently presented back to Dr. Mayford Knife to re-establish care. She was previously seen 4 years ago but lost to follow-up. She was recently referred back to Dr. Mayford Knife for evaluation of pre-syncope and DOE. She was also planning to have knee replacement surgery sometime this fall and her PCP wanted her AV reevaluated. She reported labile BP, sometimes normal, high, or dropping into the 90s. She was asked to use compression hose. 2D echo 11/01/16 showed moderate LVH, normal LV function, grade 1 diastolic dysfunction, high ventricular filling pressure, severe aortic stenosis, mild mitral regurgitation, severely dilated left atrium, mild TR. Dr. Mayford Knife recommended she return for follow-up visit to arrange and discuss right and left heart cath (R&LHC) last week. She now presents for R&LHC.  Time Out: Verified patient identification, verified procedure, site/side was marked, verified correct patient position, special equipment/implants available, medications/allergies/relevent history reviewed, required imaging and test results available. Performed.  Access:  Right Radial Artery: 6 Fr sheath -- Seldinger technique using Angiocath Micropuncture Kit -- 10 mL radial cocktail IA; 4000 Units IV Heparin * Right Brachial/Antecubital Vein: unable to place IV with Ultrasound Access - converted to Femoral * Right Common Femoral Vein: 7 Fr Sheath - Seldinger Technique..  Right Heart Catheterization: 7 Fr Gordy Councilman catheter advanced under fluoroscopy with balloon inflated to the RA, RV, then PCWP-PA for hemodynamic measurement.   * Simultaneous FA & PA blood gases checked for SaO2% to calculate FICK CO/CI  * Thermodilution Injections performed to calculate CO/CI  * Catheter removed completely out of the body with balloon deflated.  Left Heart Catheterization: 5 Fr Catheters advanced or exchanged over a J-wire under direct fluoroscopic guidance into the ascending aorta; TIG 4.0 catheter advanced first.  * LV Hemodynamics (LV Gram): AL-1 (using straight wire) * Left Coronary Artery Cineangiography: JL3.5 Catheter  * Right Coronary Artery Cineangiography: TIG 4.0 Catheter   Upon completion of Angiogaphy, the catheter was removed completely out of the body over a wire, without complication.  Femoral Sheath(s) removed in the PACU Holding Area with manual pressure for hemostasis.   Radial sheath removed in the Cardiac Catheterization lab with TR Band placed for hemostasis.  TR Band: 1345 Hours; 13 mL air  MEDICATIONS * SQ Lidocaine 78m; 15 mL for Femoral Venous Access. * Radial Cocktail: 3 mg Verapmil in 10 mL NS * Isovue Contrast: 90 mL * Heparin: 4000 Units  Fluoro time: 8.3 minutes. Dose Area Product: 108144mGycm2. Cumulative Air Kerma: 306.7 mGy.   Estimated blood loss <50 mL.  During this procedure the patient was administered the following to achieve and maintain moderate conscious sedation: Versed 1 mg, Dilaudid 25 mg, while the patient's heart rate, blood pressure, and oxygen saturation were continuously monitored. The period of conscious sedation was 64 minutes, of which I was present face-to-face 100% of this time.    Complications   Complications documented before study signed (11/15/2016 2:07 PM EDT)    No complications were associated with this study.  Documented by HLeonie Man MD - 11/15/2016 1:56 PM EDT    Coronary Findings   Dominance: Co-dominant  Left Main  Vessel was injected. Vessel is large. Vessel is angiographically normal.  Left Anterior Descending  Vessel was injected.  Vessel is normal in caliber. Vessel is angiographically normal.  Dist LAD lesion, 45% stenosed. The lesion is tubular, eccentric and irregular.  First Diagonal Branch  Vessel was injected. Vessel is moderate in size. Vessel is angiographically normal.  1st Diag lesion, 45% stenosed. The lesion is tubular, concentric and smooth.  Lateral First Diagonal Branch  Vessel was injected. Vessel is small in size. Vessel is angiographically normal.  First Septal Branch  Vessel was injected. Vessel is small in size. Vessel is angiographically normal.  Second Septal Branch  Vessel was injected. Vessel is small in size. Vessel is angiographically normal.  Third Diagonal Branch  Vessel was injected. Vessel is small in size. Vessel is angiographically normal.  Third Septal Branch  Vessel was injected. Vessel is small in size. Vessel is angiographically normal.  Ramus Intermedius  Vessel was injected. Vessel is normal in caliber. Vessel is angiographically normal.  Lateral Ramus Intermedius  Vessel was injected. Vessel is moderate in size. Vessel is angiographically normal.  Left Circumflex  Vessel was injected. Vessel is normal in caliber. Vessel is angiographically normal.  First Obtuse Marginal Branch  Vessel was injected. Vessel is normal in size. Vessel is  angiographically normal.  Lateral First Obtuse Marginal Branch  Vessel was injected. Vessel is moderate in size. Vessel is angiographically normal.  Third Obtuse Marginal Branch  Vessel was injected. Vessel is moderate in size. Vessel is angiographically normal.  Lateral Third Obtuse Marginal Branch  Vessel was injected. Vessel is small in size. Vessel is angiographically normal.  Right Coronary Artery  Vessel was injected. Vessel is large. Vessel is angiographically normal.  Prox RCA lesion, 30% stenosed. The lesion is concentric and irregular.  Acute Marginal Branch  Vessel was injected. Vessel is small in size. Vessel is angiographically  normal.  Right Posterior Descending Artery  Vessel was injected. Vessel is moderate in size. Vessel is angiographically normal.  Ost RPDA lesion, 40% stenosed. The lesion is tubular.  Inferior Septal  Vessel was injected. Vessel is small in size. Vessel is angiographically normal.  Right Heart   Right Heart Pressures PAP/mean: 28/7/14 mmHg PCWP: 3-5 mmHg LV EDP is normal. LVP/EDP: 220/4/13 mmHg AoP/MAP: 167/62/106 mmHg AO sat% 98%; PA sat% 79% Cardiac Output/Index: Thermodilution-6.41/3.48; Fick 7.63/4.15    Right Atrium Right atrial pressure is decreased. 1-2 mmHg    Right Ventricle RVP/EDP: 30/0/2 mmHg    Wall Motion   Not assessed.        Left Heart   Aortic Valve There is severe aortic valve stenosis. P-P Gradient: 48-52 mmHg Mean Gradient: 40.5 mmHg AVA ~0.76 cm; AVA Index 0.41 cm/BSA    Coronary Diagrams   Diagnostic Diagram       Implants     No implant documentation for this case.  MERGE Images   Show images for CARDIAC CATHETERIZATION   Link to Procedure Log   Procedure Log    Hemo Data    Most Recent Value  Fick Cardiac Output 7.63 L/min  Fick Cardiac Output Index 4.15 (L/min)/BSA  Thermal Cardiac Output 6.41 L/min  Thermal Cardiac Output Index 3.48 (L/min)/BSA  Aortic Mean Gradient 40.5 mmHg  Aortic Peak Gradient 48 mmHg  Aortic Valve Area 0.76  Aortic Value Area Index 0.41 cm2/BSA  RA A Wave 2 mmHg  RA V Wave 1 mmHg  RA Mean 0 mmHg  RV Systolic Pressure 30 mmHg  RV Diastolic Pressure -1 mmHg  RV EDP 2 mmHg  PA Systolic Pressure 28 mmHg  PA Diastolic Pressure 7 mmHg  PA Mean 14 mmHg  PW A Wave 7 mmHg  PW V Wave 5 mmHg  PW Mean 3 mmHg  AO Systolic Pressure 173 mmHg  AO Diastolic Pressure 67 mmHg  AO Mean 112 mmHg  LV Systolic Pressure 220 mmHg  LV Diastolic Pressure 4 mmHg  LV EDP 13 mmHg  Arterial Occlusion Pressure Extended Systolic Pressure 167 mmHg  Arterial Occlusion Pressure Extended Diastolic Pressure 62 mmHg  Arterial  Occlusion Pressure Extended Mean Pressure 106 mmHg  Left Ventricular Apex Extended Systolic Pressure 215 mmHg  Left Ventricular Apex Extended Diastolic Pressure 0 mmHg  Left Ventricular Apex Extended EDP Pressure 13 mmHg  TPVR Index 4.01 HRUI  TSVR Index 32.15 HRUI  TPVR/TSVR Ratio 0.13    Cardiac TAVR CT  TECHNIQUE: The patient was scanned on a Siemens [id number] scanner. A 120 kV retrospective scan was triggered in the ascending thoracic aorta at 140 HU's. Gantry rotation speed was 250 msecs and collimation was .6 mm. No beta blockade or nitro were given. The 3D data set was reconstructed in 5% intervals of the R-R cycle. Systolic and diastolic phases were analyzed on a dedicated work station using MPR, MIP  and VRT modes. The patient received 80 cc of contrast.  FINDINGS: Aortic Valve: Tri leaflet and calcified with restricted leaflet motion  Aorta: Marked mixed and calcific atherosclerosis of the entire aortic root, arch and descending thoracic aorta normal arch vessel origins  Sinotubular Junction:  26 mm  Ascending Thoracic Aorta:  30 mm  Aortic Arch:  27 mm  Descending Thoracic Aorta:  27 mm  Sinus of Valsalva Measurements:  Non-coronary:  31 mm  Right - coronary:  29.5 mm  Left - coronary:  30 mm  Coronary Artery Height above Annulus:  Left Main:  12.4 mm  Right Coronary:  14.6 mm  Virtual Basal Annulus Measurements:  Maximum/Minimum Diameter:  28.3 mm x 22.2 mm  Perimeter:  81.4 mm  Area:  491.4 mm2  Coronary Arteries:  Sufficient  height above annulus for deployment  Optimum Fluoroscopic Angle for Delivery: LAO 15 degrees Caudal 6 degrees  IMPRESSION: 1) Calcified tri leaflet aortic valve with annular area 491 mm2 and perimeter 81.4 mm suitable for a 26 mm Sapien 3 valve or a 29 mm Evolut Pro valve  2) Optimum angle for deployment LAO 15 degrees Caudal 6 degrees  3) Coronary arteries sufficient height above annulus for  deployment  4) No LAA thrombus  5) STJ calcified around approximately 30% of its circumference  6) Fairly marked calcific atherosclerosis of the entire aortic root, arch and descending thoracic aorta  Jenkins Rouge   Electronically Signed   By: Jenkins Rouge M.D.   On: 11/24/2016 11:25    STS Risk Calculator Procedure: AV Replacement  Risk of Mortality: 5.698%  Morbidity or Mortality: 22.898%  Long Length of Stay: 12.005%  Short Length of Stay: 17.377%  Permanent Stroke: 2.76%  Prolonged Ventilation: 14.761%  DSW Infection: 0.292%  Renal Failure: 5.471%  Reoperation: 9.122%       Impression:  Patient has stage D severe symptomatic aortic stenosis. She describes a long history of slowly progressive symptoms of exertional shortness of breath, chest tightness, and fatigue consistent with chronic diastolic congestive heart failure, New York Heart Association functional class IIb.  She has also been having some symptoms of dizziness without syncope. I have personally reviewed the patient's recent transthoracic echocardiogram, diagnostic cardiac catheterization, and CT angiograms. Echocardiogram performed 11/01/2016 reveals severe aortic stenosis with preserved left ventricular systolic function. Aortic valve is trileaflet with severe thickening, calcification, and restricted leaflet mobility involving all 3 leaflets. The peak velocity across aortic valve measured nearly 4.1 m/s corresponding to mean transvalvular gradient estimated 39 mmHg. The DVI was reported 0.17.  Left and right heart catheterization performed 11/15/2016 revealed mild nonobstructive coronary artery disease and confirms the presence of severe aortic stenosis. Mean transvalvular gradient measured at catheterization was 40.5 mmHg corresponding to aortic valve area calculated 0.76 cm.  Right-sided pressures were normal.  I agree the patient needs aortic valve replacement. Risks associated with conventional  surgery would be at least moderately elevated because of the patient's advanced age and somewhat limited mobility. Furthermore, risks may be considerably higher than that predicted using the STS risk calculator due to the presence of significant calcification in the aortic root and proximal ascending thoracic aorta.  Cardiac-gated CTA of the heart reveals anatomical characteristics consistent with aortic stenosis suitable for treatment by transcatheter aortic valve replacement without any significant complicating features other than some nearly circumferential calcification at the sino tubular junction.  CTA of the aorta and iliac vessels demonstrate what appears to be severe aortoiliac occlusive disease inadequate  pelvic vascular access to facilitate a transfemoral approach.  Either the left subclavian or left carotid arteries appear to be adequate for alternative access.    Plan:  The patient and her daughter were counseled at length regarding treatment alternatives for management of severe symptomatic aortic stenosis. Alternative approaches such as conventional aortic valve replacement, transcatheter aortic valve replacement, and palliative medical therapy were compared and contrasted at length.  The risks associated with conventional surgical aortic valve replacement were been discussed in detail, as were expectations for post-operative convalescence, and why I would be reluctant to consider this patient a candidate for conventional surgery.  Issues specific to transcatheter aortic valve replacement were discussed including questions about long term valve durability, the potential for paravalvular leak, possible increased risk of need for permanent pacemaker placement, and other technical complications related to the procedure itself.  Long-term prognosis with medical therapy was discussed. This discussion was placed in the context of the patient's own specific clinical presentation and past medical  history.  Potential for problems with vascular access was discussed including the risks and benefits of both trans-carotid and transit subclavian approach.  All of their questions been addressed.  Following the decision to proceed with transcatheter aortic valve replacement, a discussion has been held regarding what types of management strategies would be attempted intraoperatively in the event of life-threatening complications, including whether or not the patient would be considered a candidate for the use of cardiopulmonary bypass and/or conversion to open sternotomy for attempted surgical intervention.  The patient has been advised of a variety of complications that might develop including but not limited to risks of death, stroke, paravalvular leak, aortic dissection or other major vascular complications, aortic annulus rupture, device embolization, cardiac rupture or perforation, mitral regurgitation, acute myocardial infarction, arrhythmia, heart block or bradycardia requiring permanent pacemaker placement, congestive heart failure, respiratory failure, renal failure, pneumonia, infection, other late complications related to structural valve deterioration or migration, or other complications that might ultimately cause a temporary or permanent loss of functional independence or other long term morbidity.  The patient provides full informed consent for the procedure as described and all questions were answered.   I spent in excess of 90 minutes during the conduct of this office consultation and >50% of this time involved direct face-to-face encounter with the patient for counseling and/or coordination of their care.     Valentina Gu. Roxy Manns, MD 12/03/2016 11:20 AM

## 2016-12-08 ENCOUNTER — Inpatient Hospital Stay (HOSPITAL_COMMUNITY): Payer: Medicare Other

## 2016-12-08 ENCOUNTER — Other Ambulatory Visit: Payer: Self-pay

## 2016-12-08 DIAGNOSIS — Z952 Presence of prosthetic heart valve: Secondary | ICD-10-CM

## 2016-12-08 DIAGNOSIS — I350 Nonrheumatic aortic (valve) stenosis: Secondary | ICD-10-CM

## 2016-12-08 LAB — BASIC METABOLIC PANEL
ANION GAP: 7 (ref 5–15)
BUN: 12 mg/dL (ref 6–20)
CO2: 26 mmol/L (ref 22–32)
Calcium: 8.3 mg/dL — ABNORMAL LOW (ref 8.9–10.3)
Chloride: 104 mmol/L (ref 101–111)
Creatinine, Ser: 0.92 mg/dL (ref 0.44–1.00)
GFR calc Af Amer: >60 mL/min (ref >60)
GFR calc non Af Amer: 55 mL/min — ABNORMAL LOW (ref >60)
GLUCOSE: 89 mg/dL (ref 65–99)
POTASSIUM: 3.9 mmol/L (ref 3.5–5.1)
Sodium: 137 mmol/L (ref 135–145)

## 2016-12-08 LAB — CBC
HEMATOCRIT: 34.7 % — AB (ref 36.0–46.0)
Hemoglobin: 10.9 g/dL — ABNORMAL LOW (ref 12.0–15.0)
MCH: 28.2 pg (ref 26.0–34.0)
MCHC: 31.4 g/dL (ref 30.0–36.0)
MCV: 89.9 fL (ref 78.0–100.0)
Platelets: 153 10*3/uL (ref 150–400)
RBC: 3.86 MIL/uL — AB (ref 3.87–5.11)
RDW: 15.1 % (ref 11.5–15.5)
WBC: 5.9 10*3/uL (ref 4.0–10.5)

## 2016-12-08 LAB — ECHOCARDIOGRAM LIMITED
AO mean calculated velocity dopler: 134 cm/s
AOPV: 0.68 m/s
AOVTI: 38 cm
AV Area VTI index: 1.38 cm2/m2
AV Area VTI: 2.36 cm2
AV Peak grad: 16 mmHg
AV VEL mean LVOT/AV: 0.74
AV vel: 2.57
AVAREAMEANV: 2.56 cm2
AVAREAMEANVIN: 1.37 cm2/m2
AVG: 9 mmHg
AVLVOTPG: 7 mmHg
AVPKVEL: 199 cm/s
CHL CUP AV PEAK INDEX: 1.27
CHL CUP DOP CALC LVOT VTI: 28.2 cm
CHL CUP MV DEC (S): 271
EERAT: 15.19
EWDT: 271 ms
Height: 66 in
LV E/e' medial: 15.19
LV TDI E'MEDIAL: 5.66
LV e' LATERAL: 6.53 cm/s
LVEEAVG: 15.19
LVOT area: 3.46 cm2
LVOT diameter: 21 mm
LVOT peak VTI: 0.74 cm
LVOTPV: 136 cm/s
LVOTSV: 98 mL
MV pk E vel: 99.2 m/s
MVPG: 4 mmHg
MVPKAVEL: 131 m/s
TDI e' lateral: 6.53
Valve area index: 1.38
Valve area: 2.57 cm2
Weight: 2704 oz

## 2016-12-08 LAB — MAGNESIUM: Magnesium: 1.9 mg/dL (ref 1.7–2.4)

## 2016-12-08 MED ORDER — SODIUM CHLORIDE 0.9 % IV SOLN: 250.0000 mL | INTRAVENOUS | Status: DC | PRN

## 2016-12-08 MED ORDER — SODIUM CHLORIDE 0.9% FLUSH
3.0000 mL | Freq: Two times a day (BID) | INTRAVENOUS | Status: DC
  Administered 2016-12-08: 3 mL via INTRAVENOUS

## 2016-12-08 MED ORDER — SODIUM CHLORIDE 0.9% FLUSH: 3.0000 mL | INTRAVENOUS | Status: DC | PRN

## 2016-12-08 MED FILL — Magnesium Sulfate Inj 50%: INTRAMUSCULAR | Qty: 10 | Status: AC

## 2016-12-08 MED FILL — Potassium Chloride Inj 2 mEq/ML: INTRAVENOUS | Qty: 40 | Status: AC

## 2016-12-08 MED FILL — Phenylephrine HCl Inj 10 MG/ML: INTRAMUSCULAR | Qty: 2 | Status: AC

## 2016-12-08 MED FILL — Heparin Sodium (Porcine) Inj 1000 Unit/ML: INTRAMUSCULAR | Qty: 30 | Status: AC

## 2016-12-08 MED FILL — Sodium Chloride IV Soln 0.9%: INTRAVENOUS | Qty: 250 | Status: AC

## 2016-12-08 NOTE — Progress Notes (Signed)
[address]       [place] [address]             [phone]        CARDIOTHORACIC SURGERY PROGRESS NOTE   R1 Day Post-Op Procedure(s) (LRB): Possible, TRANSCATHETER AORTIC VALVE REPLACEMENT,CAROTID (N/A) TRANSESOPHAGEAL ECHOCARDIOGRAM (TEE) (N/A)  Subjective: No complaints except for pain right thumb assoc with Aline  Objective: Vital signs: BP Readings from Last 1 Encounters:  12/08/16 (!) 150/55   Pulse Readings from Last 1 Encounters:  12/08/16 74   Resp Readings from Last 1 Encounters:  12/08/16 15   Temp Readings from Last 1 Encounters:  12/08/16 98 F (36.7 C) (Oral)    Hemodynamics:    Physical Exam:  Rhythm:   sinus  Breath sounds: clear  Heart sounds:  RRR w/out murmur  Incisions:  Dressing dry, intact  Abdomen:  Soft, non-distended, non-tender  Extremities:  Warm, well-perfused    Intake/Output from previous day: 10/16 0701 - 10/17 0700 In: 1727.8 [I.V.:1627.8; IV Piggyback:100] Out: 1620 [Urine:1520; Blood:100] Intake/Output this shift: No intake/output data recorded.  Lab Results:  CBC: Recent Labs  12/07/16 1706 12/07/16 1720 12/08/16 0454  WBC 5.1  --  5.9  HGB 11.3* 11.2* 10.9*  HCT 35.6* 33.0* 34.7*  PLT 152  --  153    BMET:  Recent Labs  12/07/16 1613 12/07/16 1720 12/08/16 0454  NA 142 144 137  K 3.9 3.9 3.9  CL 105  --  104  CO2  --   --  26  GLUCOSE 111* 99 89  BUN 15  --  12  CREATININE 1.00  --  0.92  CALCIUM  --   --  8.3*     PT/INR:   Recent Labs  12/07/16 1706  LABPROT 14.8  INR 1.17    CBG (last 3)   Recent Labs  12/07/16 1150  GLUCAP 56*    ABG    Component Value Date/Time   PHART 7.381 12/07/2016 1754   PCO2ART 49.3 (H) 12/07/2016 1754   PO2ART 87.0 12/07/2016 1754   HCO3 29.4 (H) 12/07/2016 1754   TCO2 31 12/07/2016 1754   O2SAT 96.0 12/07/2016 1754    CXR: PORTABLE CHEST 1 VIEW  COMPARISON:  Chest x-ray of December 07, 2016  FINDINGS: The lungs  are well-expanded. The interstitial markings are coarse likely reflecting the patient's smoking history. There is no alveolar infiltrate. A trace of pleural fluid at the left lung base is suspected. The heart is mildly enlarged. The pulmonary vascularity is not engorged. The aortic valve cage is in stable position. The right internal jugular venous catheter tip projects over the junction of the middle and distal thirds of the SVC. There is thoracic aortic atherosclerosis  IMPRESSION: Fairly stable appearance of the chest since yesterday's study. Stable cardiomegaly without pulmonary vascular congestion. Tiny left pleural effusion.  Thoracic aortic atherosclerosis.   Electronically Signed   By: David  Martinique M.D.   On: 12/08/2016 08:09  EKG: NSR w/out acute ischemic changes, 1st degree AV block   Assessment/Plan: S/P Procedure(s) (LRB): Possible, TRANSCATHETER AORTIC VALVE REPLACEMENT,CAROTID (N/A) TRANSESOPHAGEAL ECHOCARDIOGRAM (TEE) (N/A)  Doing well POD1 TAVR Maintaining NSR w/ stable BP Expected post op acute blood loss anemia with h/o chronic anemia, mild   Mobilize  DAPT  Routine ECHO POD1  Resume preop meds including Norvasc for HTN  Transfer tele  Anticipate likely d/c home tomorrow   Rexene Alberts, MD 12/08/2016 8:40 AM

## 2016-12-08 NOTE — Progress Notes (Signed)
CARDIAC REHAB PHASE I   PRE:  Rate/Rhythm: 82 SR    BP: sitting 174/53    SaO2: 94 RA  MODE:  Ambulation: 84  ft   POST:  Rate/Rhythm: 92  SR    BP: sitting 179/61     SaO2: 98 RA  Pt c/o significant pain in her right wrist. Hurts to move it. She is able to press on it. She has weak legs/knees, even PTA, and uses her arms for support. Used gait belt to transfer to [facility]. We wrapped her wrist in a splint to try to stabilize it while using the RW. However she felt more comfortable holding her wrist up in the air while walking. Therefore we used gait belt, assist x2, handheld assist on left side for support. She did not use RW. Her legs and knees are weak and wobbly. Return to bed with right wrist elevated. She needs PT c/s and she is interested in going to SNF as she lives alone and her daughter just had wrist surgery herself. Pt not strong enough at this time to be independent. Will f/u tomorrow as x2.  [place], ACSM 12/08/2016 12:32 PM

## 2016-12-08 NOTE — Plan of Care (Signed)
Problem: Pain Managment: Goal: General experience of comfort will improve Outcome: Progressing Chronic neck arthritic neck pain worsened by positioning during surgery. Pain meds given to address

## 2016-12-08 NOTE — Progress Notes (Signed)
  Echocardiogram 2D Echocardiogram has been performed.  Darlina Sicilian M 12/08/2016, 2:30 PM

## 2016-12-09 ENCOUNTER — Inpatient Hospital Stay (HOSPITAL_COMMUNITY): Payer: Medicare Other

## 2016-12-09 ENCOUNTER — Encounter (HOSPITAL_COMMUNITY): Payer: Self-pay | Admitting: Physician Assistant

## 2016-12-09 DIAGNOSIS — Z954 Presence of other heart-valve replacement: Secondary | ICD-10-CM

## 2016-12-09 DIAGNOSIS — R531 Weakness: Secondary | ICD-10-CM

## 2016-12-09 DIAGNOSIS — M79609 Pain in unspecified limb: Secondary | ICD-10-CM

## 2016-12-09 DIAGNOSIS — I350 Nonrheumatic aortic (valve) stenosis: Secondary | ICD-10-CM

## 2016-12-09 LAB — BASIC METABOLIC PANEL
Anion gap: 8 (ref 5–15)
BUN: 11 mg/dL (ref 6–20)
CALCIUM: 8.4 mg/dL — AB (ref 8.9–10.3)
CHLORIDE: 102 mmol/L (ref 101–111)
CO2: 28 mmol/L (ref 22–32)
CREATININE: 0.92 mg/dL (ref 0.44–1.00)
GFR calc Af Amer: >60 mL/min (ref >60)
GFR calc non Af Amer: 55 mL/min — ABNORMAL LOW (ref >60)
GLUCOSE: 130 mg/dL — AB (ref 65–99)
Potassium: 3.7 mmol/L (ref 3.5–5.1)
Sodium: 138 mmol/L (ref 135–145)

## 2016-12-09 LAB — URINALYSIS, COMPLETE (UACMP) WITH MICROSCOPIC
Bacteria, UA: NONE SEEN
Bilirubin Urine: NEGATIVE
GLUCOSE, UA: NEGATIVE mg/dL
Hgb urine dipstick: NEGATIVE
Ketones, ur: NEGATIVE mg/dL
Leukocytes, UA: NEGATIVE
Nitrite: NEGATIVE
PROTEIN: NEGATIVE mg/dL
SPECIFIC GRAVITY, URINE: 1.023 (ref 1.005–1.030)
pH: 5 (ref 5.0–8.0)

## 2016-12-09 LAB — VAS US CAROTID
LCCADSYS: 221 cm/s
LCCAPSYS: 147 cm/s
LEFT ECA DIAS: -19 cm/s
LEFT VERTEBRAL DIAS: -13 cm/s
LICADDIAS: -28 cm/s
LICADSYS: -141 cm/s
LICAPDIAS: 19 cm/s
Left CCA dist dias: 22 cm/s
Left CCA prox dias: 16 cm/s
Left ICA prox sys: 150 cm/s

## 2016-12-09 LAB — CBC
HCT: 36.2 % (ref 36.0–46.0)
Hemoglobin: 11.5 g/dL — ABNORMAL LOW (ref 12.0–15.0)
MCH: 28.8 pg (ref 26.0–34.0)
MCHC: 31.8 g/dL (ref 30.0–36.0)
MCV: 90.5 fL (ref 78.0–100.0)
PLATELETS: 138 10*3/uL — AB (ref 150–400)
RBC: 4 MIL/uL (ref 3.87–5.11)
RDW: 15 % (ref 11.5–15.5)
WBC: 8.7 10*3/uL (ref 4.0–10.5)

## 2016-12-09 MED ORDER — ACETAMINOPHEN 325 MG PO TABS
650.0000 mg | ORAL_TABLET | Freq: Four times a day (QID) | ORAL | Status: DC | PRN
  Administered 2016-12-09 – 2016-12-10 (×2): 650 mg via ORAL
  Filled 2016-12-09 (×3): qty 2

## 2016-12-09 MED ORDER — AMLODIPINE BESYLATE 5 MG PO TABS
5.0000 mg | ORAL_TABLET | Freq: Every day | ORAL | Status: DC
  Administered 2016-12-09 – 2016-12-11 (×3): 5 mg via ORAL
  Filled 2016-12-09 (×3): qty 1

## 2016-12-09 NOTE — Progress Notes (Signed)
PT Cancellation Note  Patient Details Name: GLORA HULGAN MRN: [id number] DOB: [birth year]   Cancelled Treatment:    Reason Eval/Treat Not Completed: Other (comment) (Pt sleepy and confused. Nurse asked to HOLD today. )   Denice Paradise 12/09/2016, 12:54 PM  Henry Utsey,PT Acute Rehabilitation [phone] (pager)

## 2016-12-09 NOTE — Care Management Note (Signed)
Case Management Note  Patient Details  Name: Becky Mccall MRN: [id number] Date of Birth: 26-Dec-1929  Subjective/Objective:    From home  Alone, s/p TAVR, she has a mild temp today, and right arm pain and swelling, with cardiac rehab she needed ast x 2 and weak, pt eval has been ordered.                  Action/Plan: NCM will follow for dc needs, may need SNF.  Expected Discharge Date:                  Expected Discharge Plan:     In-House Referral:     Discharge planning Services  CM Consult  Post Acute Care Choice:    Choice offered to:     DME Arranged:    DME Agency:     HH Arranged:    HH Agency:     Status of Service:  In process, will continue to follow  If discussed at Long Length of Stay Meetings, dates discussed:    Additional Comments:  Zenon Mayo, RN 12/09/2016, 10:19 AM

## 2016-12-09 NOTE — Progress Notes (Signed)
[place] VALVE TEAM  Patient Name: Becky Mccall Date of Encounter: 12/09/2016  Primary Cardiologist: Dr. [name] / Dr Burt Knack (TAVR)  Hospital Problem List     Principal Problem:   S/P TAVR (transcatheter aortic valve replacement) Active Problems:   Hyperlipidemia   Severe aortic stenosis by prior echocardiography   COPD (chronic obstructive pulmonary disease) (HCC)   Hypothyroidism   GERD (gastroesophageal reflux disease)   OSA (obstructive sleep apnea)   SOB (shortness of breath)   Severe aortic stenosis     Subjective   Complains of right arm pain and swelling.   Inpatient Medications    Scheduled Meds: . amLODipine  2.5 mg Oral Daily  . aspirin EC  81 mg Oral Daily  . calcium-vitamin D  1 tablet Oral Q breakfast  . Chlorhexidine Gluconate Cloth  6 each Topical Daily  . cholecalciferol  6,000 Units Oral Daily  . clopidogrel  75 mg Oral Q breakfast  . levothyroxine  137 mcg Oral QAC breakfast  . magnesium oxide  800 mg Oral QHS  . multivitamin with minerals  1 tablet Oral Daily  . omega-3 acid ethyl esters  1 g Oral TID  . pantoprazole  40 mg Oral Daily   Continuous Infusions: . cefUROXime (ZINACEF)  IV Stopped (12/08/16 2148)   PRN Meds: meclizine, metoprolol tartrate, morphine injection, ondansetron (ZOFRAN) IV, oxyCODONE, traMADol   Vital Signs    Vitals:   12/09/16 0500 12/09/16 0600 12/09/16 0700 12/09/16 0836  BP:  (!) 144/50  (!) 154/50  Pulse: 75 75 78 86  Resp: 15 13 14 18   Temp:    100.2 F (37.9 C)  TempSrc:    Oral  SpO2: 95% 96% 97% 97%  Weight:      Height:        Intake/Output Summary (Last 24 hours) at 12/09/16 0838 Last data filed at 12/09/16 0400  Gross per 24 hour  Intake              710 ml  Output              975 ml  Net             -265 ml   Filed Weights   12/07/16 1154 12/08/16 2000 12/09/16 0423  Weight: 169 lb (76.7 kg) 169 lb 1.5 oz (76.7 kg) 166 lb 10.7 oz (75.6 kg)     Physical Exam   GEN: Well nourished, well developed, in no acute distress. Elderly and frail. HOH HEENT: Grossly normal.  Neck: Supple, no JVD, carotid bruits, or masses. Cardiac: RRR, no murmurs, rubs, or gallops. No clubbing, cyanosis, LE edema.  Radials/DP/PT 2+ and equal bilaterally.  Respiratory:  Respirations regular and unlabored, clear to auscultation bilaterally. GI: Soft, nontender, nondistended, BS + x 4. MS: no deformity or atrophy. Skin: warm and dry, no rash. Right arm with mild erythema just above wrist and 1+ edema. Tender to palpation and movement. Neuro:  Strength and sensation are intact. Psych: AAOx3.  Normal affect.  Labs    CBC  Recent Labs  12/07/16 1706 12/07/16 1720 12/08/16 0454  WBC 5.1  --  5.9  HGB 11.3* 11.2* 10.9*  HCT 35.6* 33.0* 34.7*  MCV 89.4  --  89.9  PLT 152  --  182   Basic Metabolic Panel  Recent Labs  12/08/16 0454 12/09/16 0550  NA 137 138  K 3.9 3.7  CL 104 102  CO2 26  28  GLUCOSE 89 130*  BUN 12 11  CREATININE 0.92 0.92  CALCIUM 8.3* 8.4*  MG 1.9  --    Liver Function Tests No results for input(s): AST, ALT, ALKPHOS, BILITOT, PROT, ALBUMIN in the last 72 hours. No results for input(s): LIPASE, AMYLASE in the last 72 hours. Cardiac Enzymes No results for input(s): CKTOTAL, CKMB, CKMBINDEX, TROPONINI in the last 72 hours. BNP Invalid input(s): POCBNP D-Dimer No results for input(s): DDIMER in the last 72 hours. Hemoglobin A1C No results for input(s): HGBA1C in the last 72 hours. Fasting Lipid Panel No results for input(s): CHOL, HDL, LDLCALC, TRIG, CHOLHDL, LDLDIRECT in the last 72 hours. Thyroid Function Tests No results for input(s): TSH, T4TOTAL, T3FREE, THYROIDAB in the last 72 hours.  Invalid input(s): FREET3  Telemetry    Sinus with few PVCs - Personally Reviewed  ECG     sinus with 1st AV block. - Personally Reviewed  Radiology    Dg Chest Port 1 View  Result Date: 12/08/2016 CLINICAL  DATA:  Status post transcatheter aortic valve replacement for aortic stenosis. History of COPD EXAM: PORTABLE CHEST 1 VIEW COMPARISON:  Chest x-ray of December 07, 2016 FINDINGS: The lungs are well-expanded. The interstitial markings are coarse likely reflecting the patient's smoking history. There is no alveolar infiltrate. A trace of pleural fluid at the left lung base is suspected. The heart is mildly enlarged. The pulmonary vascularity is not engorged. The aortic valve cage is in stable position. The right internal jugular venous catheter tip projects over the junction of the middle and distal thirds of the SVC. There is thoracic aortic atherosclerosis IMPRESSION: Fairly stable appearance of the chest since yesterday's study. Stable cardiomegaly without pulmonary vascular congestion. Tiny left pleural effusion. Thoracic aortic atherosclerosis. Electronically Signed   By: David  Martinique M.D.   On: 12/08/2016 08:09   Dg Chest Port 1 View  Result Date: 12/07/2016 CLINICAL DATA:  Status post aortic valve replacement.  Hypertension. EXAM: PORTABLE CHEST 1 VIEW COMPARISON:  December 03, 2016 FINDINGS: Central catheter tip is in the superior vena cava near the cavoatrial junction. No pneumothorax. There is no edema or consolidation. Heart size and pulmonary vascularity are normal. There is aortic atherosclerosis. No adenopathy. There is an aortic valve replacement. No evident bone lesions. IMPRESSION: No edema or consolidation. Central catheter tip in superior vena cava. No pneumothorax. Status post aortic valve replacement. There is aortic atherosclerosis. Aortic Atherosclerosis (ICD10-I70.0). Electronically Signed   By: Lowella Grip III M.D.   On: 12/07/2016 17:54    Cardiac Studies   Postoperative echo 12/08/16 Study Conclusions - Left ventricle: The cavity size was normal. Systolic function was   normal. The estimated ejection fraction was in the range of 60%   to 65%. Wall motion was normal; there  were no regional wall   motion abnormalities. Doppler parameters are consistent with   abnormal left ventricular relaxation (grade 1 diastolic   dysfunction). Doppler parameters are consistent with high   ventricular filling pressure. - Aortic valve: A bioprosthesis was present. Valve area (VTI): 2.57   cm^2. Valve area (Vmax): 2.36 cm^2. Valve area (Vmean): 2.56   cm^2. - Mitral valve: Calcified annulus. Mildly thickened leaflets . Impressions: - Limited study post TAVR; normal LV systolic function; mild   diastolic dysfunction with elevated LV filling pressure; s/p AVR   with normal mean gradient of 9 mmHg and no AI.  Patient Profile     SARAHGRACE BROMAN is a 83 y.o. female  with a history of HTN, chronic diastolic CHF, COPD, hypothyroidism, GE reflux, HLD, and severe aortic stenosis who presented to [facility] on 12/07/16 for planned TAVR.   Assessment & Plan    Severe AS: s/p successful TAVR with a 26 mm Edwards Sapien 3 THV placed via open left transcarotid approach. Post operative echo showed good valve placement with no PVL. ECG with sinus with 1st AV block. Carotid site is stable. Continue ASA/plavix   Right arm pain and swelling: possibly related to A line. Given left carotid access during TAVR we will do a non contrast CT to rule out acute CVA. She does have a mildly elevated temp to 100.9. Will re check CBC today.   Post op anemia: expected, repeat CBC pending today.   HTN: BP has been elevated. May be related to pain in arm. Will increase home amlodipine from 2.5 mg daily to 5mg  daily.   Hypothyroidism: continue synthroid  GERD: continue PPI    Signed, Angelena Form, PA-C  12/09/2016, 8:38 AM  Pager [phone]  I have seen and examined the patient and agree with the assessment and plan as outlined.  Will get CT head just to be certain right arm symptoms are not due to left hemispheric stroke, but right hand and forearm are severely swollen and tender around and distal to  sight of right radial arterial line.  Rexene Alberts, MD 12/09/2016 8:56 AM

## 2016-12-09 NOTE — Progress Notes (Signed)
TAVR team note: Called to see patient for somnolence/confusion. The patien received oxycodone at 6:30 AM and 10:30 AM today for pain in her right arm. She did have a CAT scan this morning which showed no acute abnormalities. On my exam, she is somnolent but easily arousable. Her neuro exam is nonfocal and she moves all extremities to command. There are no cranial nerve deficits. The plethysmography signal in her right fingers is normal and she continues to have pain in her right wrist area with mild ecchymosis and edema but no firm hematoma.  Suspect the patient is somnolent from receiving narcotic analgesia. Will discontinue oxycodone and use Tylenol for pain control.  [name] 12/09/2016 1:12 PM

## 2016-12-09 NOTE — Progress Notes (Signed)
TCTS BRIEF SICU PROGRESS NOTE  2 Days Post-Op  S/P Procedure(s) (LRB): Possible, TRANSCATHETER AORTIC VALVE REPLACEMENT,CAROTID (N/A) TRANSESOPHAGEAL ECHOCARDIOGRAM (TEE) (N/A)   Still somewhat somnolent but arousable.  Follows simple commands and moves all 4 extremities.  Still c/o significant pain in right arm and wrist but squeezes fingers appropriately.  Denies other complaints.  NSR w/ stable BP, breathing comfortably on room air.  Temp up 102  Plan: Continue to hold all narcotic pain relievers.  Check carotid duplex.  Mobilize as much as possible, check UA and cultures.  Keep in ICU and monitor closely.  Discussed w/ patient's daughter at bedside.  Becky Alberts, MD 12/09/2016 4:29 PM

## 2016-12-09 NOTE — Progress Notes (Signed)
.  CARDIAC REHAB PHASE I   PRE:  Rate/Rhythm: 86 SR  BP:  Supine: 165/55  Sitting:   Standing:    SaO2: 96% 2L  1100-1125 Came to see pt to walk. Was going to get pt to wheelchair to transfer to 4E. Pt is lethargic and slow to respond. Tried to get pt to lift her left arm and she could not keep it up. Encouraged her to move her legs without response. Pt knew she was in hospital but said she was here because her legs hurt. Daughter in room also and stated that she feels that pt is a lot more lethargic. Pt looks at you as if she is not seeing you and hard to comprehend. RN aware. We were able to walk pt 84 ft yesterday. We will continue to follow. Has PT consult.   Graylon Good, RN BSN  12/09/2016 11:24 AM

## 2016-12-09 NOTE — Progress Notes (Signed)
*  PRELIMINARY RESULTS* Vascular Ultrasound Carotid Duplex (Doppler) has been completed.  Preliminary findings: Left carotid duplex evaluation demonstrates a 1-39% ICA stenosis with elevated peak systolic values throughout. No obvious thrombosis or hemodynamically significant stenosis noted.  Left vertebral artery appears patent and antegrade.  Everrett Coombe 12/09/2016, 5:08 PM

## 2016-12-09 NOTE — Progress Notes (Signed)
Patient more sleepy and confused, but follows commands. NP called and made aware. Will hold pain medications for now and monitor.

## 2016-12-10 ENCOUNTER — Inpatient Hospital Stay (HOSPITAL_COMMUNITY): Payer: Medicare Other

## 2016-12-10 ENCOUNTER — Encounter (HOSPITAL_COMMUNITY): Payer: Self-pay | Admitting: Physician Assistant

## 2016-12-10 LAB — BASIC METABOLIC PANEL
ANION GAP: 8 (ref 5–15)
BUN: 12 mg/dL (ref 6–20)
CALCIUM: 8.8 mg/dL — AB (ref 8.9–10.3)
CO2: 27 mmol/L (ref 22–32)
CREATININE: 0.92 mg/dL (ref 0.44–1.00)
Chloride: 103 mmol/L (ref 101–111)
GFR, EST NON AFRICAN AMERICAN: 55 mL/min — AB (ref >60)
Glucose, Bld: 131 mg/dL — ABNORMAL HIGH (ref 65–99)
Potassium: 4 mmol/L (ref 3.5–5.1)
Sodium: 138 mmol/L (ref 135–145)

## 2016-12-10 MED ORDER — AMLODIPINE BESYLATE 5 MG PO TABS: 5.0000 mg | ORAL_TABLET | Freq: Every day | ORAL | 1 refills | Status: DC

## 2016-12-10 MED ORDER — CLOPIDOGREL BISULFATE 75 MG PO TABS: 75.0000 mg | ORAL_TABLET | Freq: Every day | ORAL | 6 refills | Status: DC

## 2016-12-10 MED ORDER — TRAMADOL HCL 50 MG PO TABS
50.0000 mg | ORAL_TABLET | Freq: Three times a day (TID) | ORAL | Status: DC | PRN
  Administered 2016-12-10 – 2016-12-13 (×6): 50 mg via ORAL
  Filled 2016-12-10 (×6): qty 1

## 2016-12-10 MED ORDER — PANTOPRAZOLE SODIUM 40 MG PO TBEC: 40.0000 mg | DELAYED_RELEASE_TABLET | Freq: Every day | ORAL | 6 refills | Status: DC

## 2016-12-10 NOTE — Progress Notes (Addendum)
[place] VALVE TEAM  Patient Name: Becky Mccall Date of Encounter: 12/10/2016  Primary Cardiologist: Dr. [name] / Dr Burt Knack (TAVR)  Hospital Problem List     Principal Problem:   S/P TAVR (transcatheter aortic valve replacement) Active Problems:   Hyperlipidemia   Severe aortic stenosis by prior echocardiography   COPD (chronic obstructive pulmonary disease) (HCC)   Hypothyroidism   GERD (gastroesophageal reflux disease)   OSA (obstructive sleep apnea)   SOB (shortness of breath)   Severe aortic stenosis     Subjective   Complains of right arm pain and swelling.   Inpatient Medications    Scheduled Meds: . amLODipine  5 mg Oral Daily  . aspirin EC  81 mg Oral Daily  . calcium-vitamin D  1 tablet Oral Q breakfast  . Chlorhexidine Gluconate Cloth  6 each Topical Daily  . cholecalciferol  6,000 Units Oral Daily  . clopidogrel  75 mg Oral Q breakfast  . levothyroxine  137 mcg Oral QAC breakfast  . magnesium oxide  800 mg Oral QHS  . multivitamin with minerals  1 tablet Oral Daily  . omega-3 acid ethyl esters  1 g Oral TID  . pantoprazole  40 mg Oral Daily   Continuous Infusions:  PRN Meds: acetaminophen, meclizine, metoprolol tartrate, ondansetron (ZOFRAN) IV   Vital Signs    Vitals:   12/10/16 0347 12/10/16 0400 12/10/16 0500 12/10/16 0621  BP:  (!) 151/57  (!) 135/59  Pulse:  81  80  Resp:  18  16  Temp: 98.4 F (36.9 C)     TempSrc: Oral     SpO2:  92%  94%  Weight:   166 lb 14.2 oz (75.7 kg)   Height:        Intake/Output Summary (Last 24 hours) at 12/10/16 0830 Last data filed at 12/10/16 2542  Gross per 24 hour  Intake              300 ml  Output              775 ml  Net             -475 ml   Filed Weights   12/08/16 2000 12/09/16 0423 12/10/16 0500  Weight: 169 lb 1.5 oz (76.7 kg) 166 lb 10.7 oz (75.6 kg) 166 lb 14.2 oz (75.7 kg)    Physical Exam   GEN: Well nourished, well developed, in  no acute distress. Elderly and frail. HOH HEENT: Grossly normal.  Neck: Supple, no JVD, carotid bruits, or masses. Cardiac: RRR, no murmurs, rubs, or gallops. No clubbing, cyanosis, LE edema.  Radials/DP/PT 2+ and equal bilaterally.  Respiratory:  Respirations regular and unlabored, clear to auscultation bilaterally. GI: Soft, nontender, nondistended, BS + x 4. MS: no deformity or atrophy. Skin: warm and dry, no rash. Right arm with no ecchymosis and improving edema. Tender to palpation and movement. Neuro:  Strength and sensation are intact. Psych: AAOx3.  Normal affect.  Labs    CBC  Recent Labs  12/08/16 0454 12/09/16 0936  WBC 5.9 8.7  HGB 10.9* 11.5*  HCT 34.7* 36.2  MCV 89.9 90.5  PLT 153 706*   Basic Metabolic Panel  Recent Labs  12/08/16 0454 12/09/16 0550  NA 137 138  K 3.9 3.7  CL 104 102  CO2 26 28  GLUCOSE 89 130*  BUN 12 11  CREATININE 0.92 0.92  CALCIUM 8.3* 8.4*  MG 1.9  --  Liver Function Tests No results for input(s): AST, ALT, ALKPHOS, BILITOT, PROT, ALBUMIN in the last 72 hours. No results for input(s): LIPASE, AMYLASE in the last 72 hours. Cardiac Enzymes No results for input(s): CKTOTAL, CKMB, CKMBINDEX, TROPONINI in the last 72 hours. BNP Invalid input(s): POCBNP D-Dimer No results for input(s): DDIMER in the last 72 hours. Hemoglobin A1C No results for input(s): HGBA1C in the last 72 hours. Fasting Lipid Panel No results for input(s): CHOL, HDL, LDLCALC, TRIG, CHOLHDL, LDLDIRECT in the last 72 hours. Thyroid Function Tests No results for input(s): TSH, T4TOTAL, T3FREE, THYROIDAB in the last 72 hours.  Invalid input(s): FREET3  Telemetry    Sinus - Personally Reviewed  ECG     sinus with 1st AV block. - Personally Reviewed  Radiology    Ct Head Wo Contrast  Result Date: 12/09/2016 CLINICAL DATA:  83 year old female with right upper extremity swelling. Query left hemisphere stroke. EXAM: CT HEAD WITHOUT CONTRAST TECHNIQUE:  Contiguous axial images were obtained from the base of the skull through the vertex without intravenous contrast. COMPARISON:  Brain MRI and head CT 05/04/2013. FINDINGS: Brain: Cerebral volume remains normal for age. Gray-white matter differentiation remains normal for age. No midline shift, ventriculomegaly, mass effect, evidence of mass lesion, intracranial hemorrhage or evidence of cortically based acute infarction. No cortical encephalomalacia identified. Vascular: Calcified atherosclerosis at the skull base. No suspicious intracranial vascular hyperdensity. Skull: Negative.  No acute osseous abnormality identified. Sinuses/Orbits: Visualized paranasal sinuses and mastoids are stable and well pneumatized. Other: No acute orbit or scalp soft tissue findings. IMPRESSION: Noncontrast noncontrast CT appearance of the brain is within normal limits for age and stable since 41. Electronically Signed   By: Genevie Ann M.D.   On: 12/09/2016 09:44   Dg Chest Port 1 View  Result Date: 12/10/2016 CLINICAL DATA:  Patient status post transcatheter aortic valve replacement 12/07/2016. EXAM: PORTABLE CHEST 1 VIEW COMPARISON:  Single-view of the chest 12/08/2016 and 12/07/2016. FINDINGS: Right IJ catheter has been removed Mild left basilar atelectasis seen on yesterday's examination is improved. The lungs are emphysematous but clear. Heart size is mildly enlarged. No pneumothorax or pleural effusion. Aortic atherosclerosis noted. IMPRESSION: No acute disease. Emphysema. Electronically Signed   By: Inge Rise M.D.   On: 12/10/2016 07:26    Cardiac Studies   Postoperative echo 12/08/16 Study Conclusions - Left ventricle: The cavity size was normal. Systolic function was   normal. The estimated ejection fraction was in the range of 60%   to 65%. Wall motion was normal; there were no regional wall   motion abnormalities. Doppler parameters are consistent with   abnormal left ventricular relaxation (grade 1  diastolic   dysfunction). Doppler parameters are consistent with high   ventricular filling pressure. - Aortic valve: A bioprosthesis was present. Valve area (VTI): 2.57   cm^2. Valve area (Vmax): 2.36 cm^2. Valve area (Vmean): 2.56   cm^2. - Mitral valve: Calcified annulus. Mildly thickened leaflets . Impressions: - Limited study post TAVR; normal LV systolic function; mild   diastolic dysfunction with elevated LV filling pressure; s/p AVR   with normal mean gradient of 9 mmHg and no AI.  Carotid artery dopplers: 12/09/16 Summary: - The left vertebral artery appears patent with antegrade flow. - Findings consistent with a 1- 33 percent stenosis involving the left internal carotid artery. Elevated systolic velocities noted throughout left carotid system.  Patient Profile     Becky Mccall is a 83 y.o. female with a history  of HTN, chronic diastolic CHF, COPD, hypothyroidism, GE reflux, HLD, and severe aortic stenosis who presented to [facility] on 12/07/16 for planned TAVR.   Assessment & Plan    Severe AS: s/p successful TAVR with a 26 mm Edwards Sapien 3 THV placed via open left transcarotid approach. Post operative echo showed good valve placement with no PVL. ECG with sinus with 1st AV block. Carotid site is stable. Continue ASA/plavix. She is very deconditioned and has mobility issues with right arm pain and swelling. Her daughter who will be staying with her recently had arm surgery and is worried about taking care of her at home. Will consult care management and PT to see if she qualifies for short term SNF placement. She will be transferred to tele today.   Right arm pain and swelling: improving. Likely related to arterial line.   Altered mental status: now resolved. Head CT yesterday was non acute and carotid doppler showed stable 1-39% stenosis without thrombosis. Will avoid narcotics   Post op anemia: expected post operatively. Improving.   HTN: BP better controlled on  increased Amlodipine 5mg  daily.   Hypothyroidism: continue synthroid  GERD: continue PPI    Signed, Angelena Form, PA-C  12/10/2016, 8:30 AM  Pager [phone]  I have seen and examined the patient and agree with the assessment and plan as outlined.  Doing much better today.  Still slow to mobilize.  Will need short term SNF placement - anticipate possible d/c tomorrow.   I spent in excess of 15 minutes during the conduct of this hospital encounter and >50% of this time involved direct face-to-face encounter with the patient for counseling and/or coordination of their care.    Rexene Alberts, MD 12/10/2016 8:40 AM

## 2016-12-10 NOTE — Plan of Care (Signed)
Problem: Activity: Goal: Risk for activity intolerance will decrease Outcome: Progressing Ambulated approx. 382ft this morning with RN, using front wheel walker for assist device. Tolerated well, maintained O2 sats on room air. Repositioned in chair after walk, right arm elevated and ice pack placed.

## 2016-12-10 NOTE — Discharge Summary (Signed)
[place] VALVE TEAM   Discharge Summary    Patient ID: Becky Mccall,  MRN: [id number], DOB/AGE: 1929-06-16 83 y.o.  Admit date: 12/07/2016 Discharge date: 12/13/2016  Primary Care Provider: [name] Primary Cardiologist: Dr. [name] / Dr Burt Knack (TAVR)   Discharge Diagnoses    Principal Problem:   S/P TAVR (transcatheter aortic valve replacement) Active Problems:   Hyperlipidemia   COPD (chronic obstructive pulmonary disease) (HCC)   Hypothyroidism   GERD (gastroesophageal reflux disease)   IBS (irritable bowel syndrome)   Carotid stenosis   OSA (obstructive sleep apnea)   Severe aortic stenosis   Allergies Allergies  Allergen Reactions  . Lisinopril Cough  . Relafen [Nabumetone] Other (See Comments)    Unsure of exact reaction.  . Statins     Muscle pain  . Tape     Developed blisters from clear tape used over cath site 10/2016  . Welchol [Colesevelam] Other (See Comments)    Muscle pain  . Zetia [Ezetimibe] Other (See Comments)    Muscle pain.  . Neosporin [Neomycin-Bacitracin Zn-Polymyx] Other (See Comments)    Cause wound/scratch to worsen  . Zinc Oxide Rash     History of Present Illness     Becky Mccall is a 83 y.o. female with a history of HTN, chronic diastolic CHF, COPD, hypothyroidism, GE reflux, HLD, and severe aortic stenosis who presented to [facility] on 12/07/16 for planned TAVR.   Patient was initially diagnosed with congestive heart failure several years ago by her primary care physician. She was noted to complain of some exertional shortness breath and lower extremity edema with chronic vertigo. Chest x-ray at that time suggested mild congestive heart failure. Echocardiogram revealed normal left ventricular systolic function with what was felt to be mild to moderate aortic stenosis. She was evaluated by Dr. [name] lost to follow-up for several years because of patient did not return for  appointments.  During this period time the patient's husband became ill and ultimately passed away following a bout with cancer last spring.  She has become progressively less mobile because of pain in her left knee related to degenerative arthritis. She returned to see Dr. [name] in early September at which time she admitted to symptoms of chronic exertional shortness of breath with occasional chest tightness and dizzy spells. Follow-up echocardiogram revealed severe aortic stenosis with preserved left ventricular systolic function. The patient underwent diagnostic cardiac catheterization 11/15/2016 revealing mild nonobstructive coronary artery disease. CT angiography was performed and the patient was referred for elective surgical consultation. She was evaluated by Dr. [name] and felt the patient would be at least moderate risk for conventional surgical aortic valve replacement. The patient has severe atherosclerotic disease and was not felt to have adequate pelvic vascular access for a transfemoral approach for transcatheter aortic valve replacement.  She was ultimately referred for TAVR via the left carotid approach, which was set up for 12/07/16.  Hospital Course     Consultants: none  Severe AS: s/p successful TAVR with a 26 mm Edwards Sapien 3 THV placed via open left transcarotid approach. Post operative echo showed good valve placement with no PVL. ECG with sinus with 1st AV block. Carotid site is stable. Continue ASA/plavix.    Right arm pain and swelling: improving. Likely related to arterial line. She can take tramadol and tylenol for pain.  Altered mental status: now resolved. Head CT 10/18 was non acute and carotid doppler 10/18 showed stable 1-39% stenosis  without thrombosis. All narcotics were discontinued   Post op anemia: expected post operatively   HTN: BP better controlled on increased amlodipine 10mg  daily. Will follow at follow up appointment  Hypothyroidism: continue  synthroid  GERD: continue PPI    Disposition: she is very deconditioned and has mobility issues complicated by her new right arm pain and swelling. Her daughter who will be staying with her recently had arm surgery and is worried about taking care of her at home. Therefore, short term SNF placement was recommended and she will go to [facility] in [place] today.   The patient has had an uncomplicated hospital course and is recovering well. The femoral catheter sites are stable. She has been seen by Dr. Roxy Manns today and deemed ready for discharge home. All follow-up appointments have been scheduled. Discharge medications are listed below.  _____________  Discharge Vitals Blood pressure (!) 140/58, pulse 71, temperature 98.7 F (37.1 C), temperature source Oral, resp. rate 16, height 5\' 6"  (1.676 m), weight 175 lb 0.7 oz (79.4 kg), SpO2 96 %.  Filed Weights   12/12/16 0422 12/12/16 0500 12/13/16 0533  Weight: 78 lb 1.6 oz (35.4 kg) 77 lb 14.4 oz (35.3 kg) 175 lb 0.7 oz (79.4 kg)    Labs & Radiologic Studies     CBC No results for input(s): WBC, NEUTROABS, HGB, HCT, MCV, PLT in the last 72 hours. Basic Metabolic Panel  Recent Labs  12/12/16 0400 12/13/16 0227  NA 137 137  K 3.9 4.0  CL 101 101  CO2 27 28  GLUCOSE 99 113*  BUN 16 13  CREATININE 0.88 0.90  CALCIUM 8.6* 8.4*   Liver Function Tests No results for input(s): AST, ALT, ALKPHOS, BILITOT, PROT, ALBUMIN in the last 72 hours. No results for input(s): LIPASE, AMYLASE in the last 72 hours. Cardiac Enzymes No results for input(s): CKTOTAL, CKMB, CKMBINDEX, TROPONINI in the last 72 hours. BNP Invalid input(s): POCBNP D-Dimer No results for input(s): DDIMER in the last 72 hours. Hemoglobin A1C No results for input(s): HGBA1C in the last 72 hours. Fasting Lipid Panel No results for input(s): CHOL, HDL, LDLCALC, TRIG, CHOLHDL, LDLDIRECT in the last 72 hours. Thyroid Function Tests No results for input(s): TSH, T4TOTAL,  T3FREE, THYROIDAB in the last 72 hours.  Invalid input(s): FREET3  Dg Chest 2 View  Result Date: 12/03/2016 CLINICAL DATA:  Pre-op for transcatheter aortic valve replacement, transfemoral on 10/16. Pt denies all chest complaints at this time. Hx multiple heart and lung conditions. EXAM: CHEST  2 VIEW COMPARISON:  05/04/2013 FINDINGS: Heart size is normal. The lungs are free of focal consolidations and pleural effusions. No pulmonary edema. Interval lumbar fusion. IMPRESSION: No evidence for acute cardiopulmonary abnormality. Electronically Signed   By: Nolon Nations M.D.   On: 12/03/2016 14:00   Ct Head Wo Contrast  Result Date: 12/09/2016 CLINICAL DATA:  83 year old female with right upper extremity swelling. Query left hemisphere stroke. EXAM: CT HEAD WITHOUT CONTRAST TECHNIQUE: Contiguous axial images were obtained from the base of the skull through the vertex without intravenous contrast. COMPARISON:  Brain MRI and head CT 05/04/2013. FINDINGS: Brain: Cerebral volume remains normal for age. Gray-white matter differentiation remains normal for age. No midline shift, ventriculomegaly, mass effect, evidence of mass lesion, intracranial hemorrhage or evidence of cortically based acute infarction. No cortical encephalomalacia identified. Vascular: Calcified atherosclerosis at the skull base. No suspicious intracranial vascular hyperdensity. Skull: Negative.  No acute osseous abnormality identified. Sinuses/Orbits: Visualized paranasal sinuses and mastoids are stable  and well pneumatized. Other: No acute orbit or scalp soft tissue findings. IMPRESSION: Noncontrast noncontrast CT appearance of the brain is within normal limits for age and stable since 33. Electronically Signed   By: Genevie Ann M.D.   On: 12/09/2016 09:44   Ct Coronary Morph W/cta Cor W/score W/ca W/cm &/or Wo/cm  Addendum Date: 11/24/2016   ADDENDUM REPORT: 11/24/2016 11:25 CLINICAL DATA:  Aortic stenosis EXAM: Cardiac TAVR CT  TECHNIQUE: The patient was scanned on a Siemens [id number] scanner. A 120 kV retrospective scan was triggered in the ascending thoracic aorta at 140 HU's. Gantry rotation speed was 250 msecs and collimation was .6 mm. No beta blockade or nitro were given. The 3D data set was reconstructed in 5% intervals of the R-R cycle. Systolic and diastolic phases were analyzed on a dedicated work station using MPR, MIP and VRT modes. The patient received 80 cc of contrast. FINDINGS: Aortic Valve: Tri leaflet and calcified with restricted leaflet motion Aorta: Marked mixed and calcific atherosclerosis of the entire aortic root, arch and descending thoracic aorta normal arch vessel origins Sinotubular Junction:  26 mm Ascending Thoracic Aorta:  30 mm Aortic Arch:  27 mm Descending Thoracic Aorta:  27 mm Sinus of Valsalva Measurements: Non-coronary:  31 mm Right - coronary:  29.5 mm Left - coronary:  30 mm Coronary Artery Height above Annulus: Left Main:  12.4 mm Right Coronary:  14.6 mm Virtual Basal Annulus Measurements: Maximum/Minimum Diameter:  28.3 mm x 22.2 mm Perimeter:  81.4 mm Area:  491.4 mm2 Coronary Arteries:  Sufficient  height above annulus for deployment Optimum Fluoroscopic Angle for Delivery: LAO 15 degrees Caudal 6 degrees IMPRESSION: 1) Calcified tri leaflet aortic valve with annular area 491 mm2 and perimeter 81.4 mm suitable for a 26 mm Sapien 3 valve or a 29 mm Evolut Pro valve 2) Optimum angle for deployment LAO 15 degrees Caudal 6 degrees 3) Coronary arteries sufficient height above annulus for deployment 4) No LAA thrombus 5) STJ calcified around approximately 30% of its circumference 6) Fairly marked calcific atherosclerosis of the entire aortic root, arch and descending thoracic aorta Jenkins Rouge Electronically Signed   By: Jenkins Rouge M.D.   On: 11/24/2016 11:25   Result Date: 11/24/2016 EXAM: OVER-READ INTERPRETATION  CT CHEST The following report is an over-read performed by radiologist Dr. Rebekah Chesterfield [facility] on 11/24/2016. This over-read does not include interpretation of cardiac or coronary anatomy or pathology. The coronary calcium score/coronary CTA interpretation by the cardiologist is attached. COMPARISON:  None. FINDINGS: Relevant extracardiac findings will be described separately under dictation for contemporaneously obtained CTA of the chest, abdomen and pelvis. IMPRESSION: Please see separate dictation for contemporaneously obtained CTA of the chest, abdomen and pelvis 11/24/2016 for full description of relevant extracardiac findings. Electronically Signed: By: Vinnie Langton M.D. On: 11/24/2016 11:05   Dg Chest Port 1 View  Result Date: 12/10/2016 CLINICAL DATA:  Patient status post transcatheter aortic valve replacement 12/07/2016. EXAM: PORTABLE CHEST 1 VIEW COMPARISON:  Single-view of the chest 12/08/2016 and 12/07/2016. FINDINGS: Right IJ catheter has been removed Mild left basilar atelectasis seen on yesterday's examination is improved. The lungs are emphysematous but clear. Heart size is mildly enlarged. No pneumothorax or pleural effusion. Aortic atherosclerosis noted. IMPRESSION: No acute disease. Emphysema. Electronically Signed   By: Inge Rise M.D.   On: 12/10/2016 07:26   Dg Chest Port 1 View  Result Date: 12/08/2016 CLINICAL DATA:  Status post transcatheter aortic valve replacement  for aortic stenosis. History of COPD EXAM: PORTABLE CHEST 1 VIEW COMPARISON:  Chest x-ray of December 07, 2016 FINDINGS: The lungs are well-expanded. The interstitial markings are coarse likely reflecting the patient's smoking history. There is no alveolar infiltrate. A trace of pleural fluid at the left lung base is suspected. The heart is mildly enlarged. The pulmonary vascularity is not engorged. The aortic valve cage is in stable position. The right internal jugular venous catheter tip projects over the junction of the middle and distal thirds of the SVC. There is  thoracic aortic atherosclerosis IMPRESSION: Fairly stable appearance of the chest since yesterday's study. Stable cardiomegaly without pulmonary vascular congestion. Tiny left pleural effusion. Thoracic aortic atherosclerosis. Electronically Signed   By: David  Martinique M.D.   On: 12/08/2016 08:09   Dg Chest Port 1 View  Result Date: 12/07/2016 CLINICAL DATA:  Status post aortic valve replacement.  Hypertension. EXAM: PORTABLE CHEST 1 VIEW COMPARISON:  December 03, 2016 FINDINGS: Central catheter tip is in the superior vena cava near the cavoatrial junction. No pneumothorax. There is no edema or consolidation. Heart size and pulmonary vascularity are normal. There is aortic atherosclerosis. No adenopathy. There is an aortic valve replacement. No evident bone lesions. IMPRESSION: No edema or consolidation. Central catheter tip in superior vena cava. No pneumothorax. Status post aortic valve replacement. There is aortic atherosclerosis. Aortic Atherosclerosis (ICD10-I70.0). Electronically Signed   By: Lowella Grip III M.D.   On: 12/07/2016 17:54   Ct Angio Chest Aorta W/cm &/or Wo/cm  Result Date: 11/24/2016 CLINICAL DATA:  83 year old female with history of severe aortic stenosis. Preprocedural study prior to potential transcatheter aortic valve replacement (TAVR) procedure. EXAM: CT ANGIOGRAPHY CHEST, ABDOMEN AND PELVIS TECHNIQUE: Multidetector CT imaging through the chest, abdomen and pelvis was performed using the standard protocol during bolus administration of intravenous contrast. Multiplanar reconstructed images and MIPs were obtained and reviewed to evaluate the vascular anatomy. CONTRAST:  100 mL of Isovue 370. COMPARISON:  No priors. FINDINGS: CTA CHEST FINDINGS Cardiovascular: Heart size is mildly enlarged with biatrial dilatation. There is no significant pericardial fluid, thickening or pericardial calcification. There is aortic atherosclerosis, as well as atherosclerosis of the great vessels  of the mediastinum and the coronary arteries, including calcified atherosclerotic plaque in the left anterior descending, left circumflex and right coronary arteries. Severe thickening calcification of the aortic valve. Moderate calcification of the posterior leaflet of the mitral valve and mitral annulus. Mediastinum/Lymph Nodes: Borderline enlarged AP window lymph node measuring 9 mm in short axis is nonspecific. No other definite mediastinal or hilar lymphadenopathy is noted. Esophagus is unremarkable in appearance. No axillary lymphadenopathy. Lungs/Pleura: Diffuse bronchial wall thickening with mild centrilobular and paraseptal emphysema. Calcified granuloma in the right upper lobe. No other definite suspicious appearing pulmonary nodules or masses are noted. No acute consolidative airspace disease. No pleural effusions. Areas of linear scarring are noted in the lungs bilaterally, most evident in the inferior segment of the lingula and inferior aspect of the left lower lobe. Musculoskeletal/Soft Tissues: There are no aggressive appearing lytic or blastic lesions noted in the visualized portions of the skeleton. CTA ABDOMEN AND PELVIS FINDINGS Hepatobiliary: Multiple well-defined low-attenuation lesions in the liver are noted, compatible with simple cysts, measuring up to 1.5 cm in segment 6. No suspicious hepatic lesions. No intra or extrahepatic biliary ductal dilatation. Gallbladder is normal in appearance. Pancreas: No pancreatic mass. No pancreatic ductal dilatation. No pancreatic or peripancreatic fluid or inflammatory changes. Spleen: Unremarkable. Adrenals/Urinary Tract: Bilateral kidneys and  bilateral adrenal glands are normal in appearance. No hydroureteronephrosis. Urinary bladder is normal in appearance. Stomach/Bowel: Normal appearance of the stomach. No pathologic dilatation of small bowel or colon. Several colonic diverticulae are noted, without surrounding inflammatory changes to suggest an acute  diverticulitis at this time. The appendix is not confidently identified and may be surgically absent. Regardless, there are no inflammatory changes noted adjacent to the cecum to suggest the presence of an acute appendicitis at this time. Vascular/Lymphatic: Aortic atherosclerosis, with vascular findings and measurements pertinent to potential TAVR procedure, as detailed below. Fusiform ectasia of the infrarenal abdominal aorta which measures up to 2.7 x 2.6 cm. No aneurysm or dissection noted in the abdominal or pelvic vasculature. Celiac axis, superior mesenteric artery and inferior mesenteric artery are all widely patent without hemodynamically significant stenosis. One left and 2 right renal artery is are both patent without hemodynamically significant stenosis. No lymphadenopathy noted in the abdomen or pelvis. Reproductive: Status posthysterectomy. Ovaries are not confidently identified may be surgically absent or atrophic. Other: Trace volume of ascites in the low anatomic pelvis. No pneumoperitoneum. Musculoskeletal: Status post PLIF from L2-L5 with interbody cages at L2-L3, L3-L4 and L4-L5. Status post laminectomy from L2-L4. There are no aggressive appearing lytic or blastic lesions noted in the visualized portions of the skeleton. VASCULAR MEASUREMENTS PERTINENT TO TAVR: AORTA: Minimal Aortic Diameter -  13 x 10 mm Severity of Aortic Calcification -  severe RIGHT PELVIS: Right Common Iliac Artery - Minimal Diameter - 6.6 x 6.2 mm Tortuosity - mild Calcification - severe Right External Iliac Artery - Minimal Diameter - 4.2 x 5.0 mm Tortuosity - mild Calcification - moderate to severe Right Common Femoral Artery - Minimal Diameter - 4.8 x 4.3 mm Tortuosity - mild Calcification - moderate to severe LEFT PELVIS: Left Common Iliac Artery - Minimal Diameter - 7.8 x 6.3 mm Tortuosity - mild Calcification - severe Left External Iliac Artery - Minimal Diameter - 5.5 x 3.9 mm Tortuosity - mild Calcification -  moderate to severe Left Common Femoral Artery - Minimal Diameter - 7.2 x 2.3 mm Tortuosity - mild Calcification - moderate to severe Review of the MIP images confirms the above findings. IMPRESSION: 1. Vascular findings and measurements pertinent to potential TAVR procedure, as detailed above. This patient does not appear to have suitable pelvic arterial access on either side. 2. Severe thickening calcification of the aortic valve, compatible with reported clinical history of severe aortic stenosis. 3. Aortic atherosclerosis, in addition to three-vessel coronary artery disease. 4. Mild cardiomegaly with biatrial dilatation. 5. Mild colonic diverticulosis without evidence of acute diverticulitis at this time. 6. Trace ascites. 7. Additional incidental findings, as above. Aortic Atherosclerosis (ICD10-I70.0). Electronically Signed   By: Vinnie Langton M.D.   On: 11/24/2016 11:58   Ct Angio Abd/pel W/ And/or W/o  Result Date: 11/24/2016 CLINICAL DATA:  83 year old female with history of severe aortic stenosis. Preprocedural study prior to potential transcatheter aortic valve replacement (TAVR) procedure. EXAM: CT ANGIOGRAPHY CHEST, ABDOMEN AND PELVIS TECHNIQUE: Multidetector CT imaging through the chest, abdomen and pelvis was performed using the standard protocol during bolus administration of intravenous contrast. Multiplanar reconstructed images and MIPs were obtained and reviewed to evaluate the vascular anatomy. CONTRAST:  100 mL of Isovue 370. COMPARISON:  No priors. FINDINGS: CTA CHEST FINDINGS Cardiovascular: Heart size is mildly enlarged with biatrial dilatation. There is no significant pericardial fluid, thickening or pericardial calcification. There is aortic atherosclerosis, as well as atherosclerosis of the great vessels of the  mediastinum and the coronary arteries, including calcified atherosclerotic plaque in the left anterior descending, left circumflex and right coronary arteries. Severe  thickening calcification of the aortic valve. Moderate calcification of the posterior leaflet of the mitral valve and mitral annulus. Mediastinum/Lymph Nodes: Borderline enlarged AP window lymph node measuring 9 mm in short axis is nonspecific. No other definite mediastinal or hilar lymphadenopathy is noted. Esophagus is unremarkable in appearance. No axillary lymphadenopathy. Lungs/Pleura: Diffuse bronchial wall thickening with mild centrilobular and paraseptal emphysema. Calcified granuloma in the right upper lobe. No other definite suspicious appearing pulmonary nodules or masses are noted. No acute consolidative airspace disease. No pleural effusions. Areas of linear scarring are noted in the lungs bilaterally, most evident in the inferior segment of the lingula and inferior aspect of the left lower lobe. Musculoskeletal/Soft Tissues: There are no aggressive appearing lytic or blastic lesions noted in the visualized portions of the skeleton. CTA ABDOMEN AND PELVIS FINDINGS Hepatobiliary: Multiple well-defined low-attenuation lesions in the liver are noted, compatible with simple cysts, measuring up to 1.5 cm in segment 6. No suspicious hepatic lesions. No intra or extrahepatic biliary ductal dilatation. Gallbladder is normal in appearance. Pancreas: No pancreatic mass. No pancreatic ductal dilatation. No pancreatic or peripancreatic fluid or inflammatory changes. Spleen: Unremarkable. Adrenals/Urinary Tract: Bilateral kidneys and bilateral adrenal glands are normal in appearance. No hydroureteronephrosis. Urinary bladder is normal in appearance. Stomach/Bowel: Normal appearance of the stomach. No pathologic dilatation of small bowel or colon. Several colonic diverticulae are noted, without surrounding inflammatory changes to suggest an acute diverticulitis at this time. The appendix is not confidently identified and may be surgically absent. Regardless, there are no inflammatory changes noted adjacent to the  cecum to suggest the presence of an acute appendicitis at this time. Vascular/Lymphatic: Aortic atherosclerosis, with vascular findings and measurements pertinent to potential TAVR procedure, as detailed below. Fusiform ectasia of the infrarenal abdominal aorta which measures up to 2.7 x 2.6 cm. No aneurysm or dissection noted in the abdominal or pelvic vasculature. Celiac axis, superior mesenteric artery and inferior mesenteric artery are all widely patent without hemodynamically significant stenosis. One left and 2 right renal artery is are both patent without hemodynamically significant stenosis. No lymphadenopathy noted in the abdomen or pelvis. Reproductive: Status posthysterectomy. Ovaries are not confidently identified may be surgically absent or atrophic. Other: Trace volume of ascites in the low anatomic pelvis. No pneumoperitoneum. Musculoskeletal: Status post PLIF from L2-L5 with interbody cages at L2-L3, L3-L4 and L4-L5. Status post laminectomy from L2-L4. There are no aggressive appearing lytic or blastic lesions noted in the visualized portions of the skeleton. VASCULAR MEASUREMENTS PERTINENT TO TAVR: AORTA: Minimal Aortic Diameter -  13 x 10 mm Severity of Aortic Calcification -  severe RIGHT PELVIS: Right Common Iliac Artery - Minimal Diameter - 6.6 x 6.2 mm Tortuosity - mild Calcification - severe Right External Iliac Artery - Minimal Diameter - 4.2 x 5.0 mm Tortuosity - mild Calcification - moderate to severe Right Common Femoral Artery - Minimal Diameter - 4.8 x 4.3 mm Tortuosity - mild Calcification - moderate to severe LEFT PELVIS: Left Common Iliac Artery - Minimal Diameter - 7.8 x 6.3 mm Tortuosity - mild Calcification - severe Left External Iliac Artery - Minimal Diameter - 5.5 x 3.9 mm Tortuosity - mild Calcification - moderate to severe Left Common Femoral Artery - Minimal Diameter - 7.2 x 2.3 mm Tortuosity - mild Calcification - moderate to severe Review of the MIP images confirms the  above findings. IMPRESSION:  1. Vascular findings and measurements pertinent to potential TAVR procedure, as detailed above. This patient does not appear to have suitable pelvic arterial access on either side. 2. Severe thickening calcification of the aortic valve, compatible with reported clinical history of severe aortic stenosis. 3. Aortic atherosclerosis, in addition to three-vessel coronary artery disease. 4. Mild cardiomegaly with biatrial dilatation. 5. Mild colonic diverticulosis without evidence of acute diverticulitis at this time. 6. Trace ascites. 7. Additional incidental findings, as above. Aortic Atherosclerosis (ICD10-I70.0). Electronically Signed   By: Vinnie Langton M.D.   On: 11/24/2016 11:58     Diagnostic Studies/Procedures     TAVR OPERATIVE NOTE   Date of Procedure:                12/07/2016  Preoperative Diagnosis:      Severe Aortic Stenosis   Postoperative Diagnosis:    Same   Procedure:      Transcatheter Aortic Valve Replacement - Left carotid approach             Edwards Sapien 3 THV (size 26 mm, model # 9600TFX, serial # [id number])              Co-Surgeons:                        Valentina Gu. Roxy Manns, MD and [name], MD  Pre-operative Echo Findings: ? Severe severe aortic stenosis ? Normal left ventricular systolic function  Post-operative Echo Findings: ? Trace paravalvular leak ?  Normal left ventricular systolic function  _____________  Postoperative echo 12/08/16 Study Conclusions - Left ventricle: The cavity size was normal. Systolic function was normal. The estimated ejection fraction was in the range of 60% to 65%. Wall motion was normal; there were no regional wall motion abnormalities. Doppler parameters are consistent with abnormal left ventricular relaxation (grade 1 diastolic dysfunction). Doppler parameters are consistent with high ventricular filling pressure. - Aortic valve: A bioprosthesis was present. Valve  area (VTI): 2.57 cm^2. Valve area (Vmax): 2.36 cm^2. Valve area (Vmean): 2.56 cm^2. - Mitral valve: Calcified annulus. Mildly thickened leaflets . Impressions: - Limited study post TAVR; normal LV systolic function; mild diastolic dysfunction with elevated LV filling pressure; s/p AVR with normal mean gradient of 9 mmHg and no AI.  Carotid artery dopplers: 12/09/16 Summary: - The left vertebral artery appears patent with antegrade flow. - Findings consistent with a 1- 21 percent stenosis involving the left internal carotid artery. Elevated systolic velocities noted throughout left carotid system.  Disposition   Pt is being discharged home today in good condition.  Follow-up Plans & Appointments     Contact information for follow-up providers    Eileen Stanford, PA-C. Go on 12/22/2016.   Specialties:  Cardiology, Radiology Why:  @ 2:30pm  Contact information: [place] [id number] [phone]            Contact information for after-discharge care    Destination    [facility] Follow up.   Specialty:  [place] information: [place] [phone]                 Discharge Instructions    Amb Referral to Cardiac Rehabilitation    Complete by:  As directed    Diagnosis:  Valve Replacement   Valve:  Aortic      Discharge Medications     Medication List  TAKE these medications   amLODipine 10 MG tablet Commonly known as:  NORVASC Take 1 tablet (10 mg total) by mouth daily. What changed:  medication strength  how much to take   aspirin EC 81 MG tablet Take 1 tablet (81 mg total) by mouth daily.   CALCIUM 1000 + D PO Take 1 tablet by mouth daily.   Cinnamon 500 MG capsule Take 500 mg by mouth 2 (two) times daily.   clopidogrel 75 MG tablet Commonly known as:  PLAVIX Take 1 tablet (75 mg total) by mouth daily  with breakfast.   levothyroxine 137 MCG tablet Commonly known as:  SYNTHROID, LEVOTHROID Take 137 mcg by mouth daily before breakfast.   Magnesium 500 MG Tabs Take 1,000 mg by mouth at bedtime.   meclizine 25 MG tablet Commonly known as:  ANTIVERT Take 12.5-25 mg by mouth every 6 (six) hours as needed for dizziness.   multivitamin with minerals Tabs tablet Take 1 tablet by mouth daily.   omega-3 acid ethyl esters 1 g capsule Commonly known as:  LOVAZA Take 1 g by mouth 3 (three) times daily.   pantoprazole 40 MG tablet Commonly known as:  PROTONIX Take 1 tablet (40 mg total) by mouth daily.   SLEEP AID 25 MG tablet Generic drug:  doxylamine (Sleep) Take 50 mg by mouth at bedtime.   traMADol 50 MG tablet Commonly known as:  ULTRAM Take 1 tablet (50 mg total) by mouth every 8 (eight) hours as needed (pain not relieved by tylenol).   Turmeric 500 MG Caps Take 500 mg by mouth 2 (two) times daily.   vitamin B-12 1000 MCG tablet Commonly known as:  CYANOCOBALAMIN Take 1,000 mcg by mouth daily.   vitamin C 500 MG tablet Commonly known as:  ASCORBIC ACID Take 500 mg by mouth daily.   Vitamin D 2000 units tablet Take 6,000 Units by mouth daily.   vitamin E 400 UNIT capsule Take 400 Units by mouth 2 (two) times daily.         Outstanding Labs/Studies   none  Duration of Discharge Encounter   Greater than 30 minutes including physician time.  Signed, Angelena Form PA-C 12/13/2016, 1:13 PM

## 2016-12-10 NOTE — Discharge Instructions (Signed)

## 2016-12-10 NOTE — Evaluation (Signed)
Physical Therapy Evaluation Patient Details Name: Becky Mccall MRN: [id number] DOB: 1929-12-09 Today's Date: 12/10/2016   History of Present Illness  AVALEY Mccall is a 83 y.o. female with a history of HTN, chronic diastolic CHF, COPD, hypothyroidism, GE reflux, HLD, and severe aortic stenosis who presented to [facility]. s/p  TAVR 12/07/16  Clinical Impression  Pt admitted with above diagnosis. Pt currently with functional limitations due to the deficits listed below (see PT Problem List). Pt is limited in her mobility by pain and swelling in her R UE that decreases ability to use AD for safe ambulation. Pt currently minA for transfers and minA for ambulation of 150 with RW. Pt will benefit from skilled PT to increase their independence and safety with mobility to allow discharge to the venue listed below.       Follow Up Recommendations SNF    Equipment Recommendations  Other (comment) (to be determined at next venue)    Recommendations for Other Services OT consult     Precautions / Restrictions Precautions Precautions: None Restrictions Weight Bearing Restrictions: No      Mobility  Bed Mobility               General bed mobility comments: up in recliner on entry  Transfers Overall transfer level: Needs assistance Equipment used: Rolling walker (2 wheeled) Transfers: Sit to/from Stand Sit to Stand: Min assist         General transfer comment: minA for powerup, good steadying in standing, vc for hand placement, required assist getting R arm up to RW  Ambulation/Gait Ambulation/Gait assistance: Min guard;Min assist Ambulation Distance (Feet): 150 Feet Assistive device: Rolling walker (2 wheeled) Gait Pattern/deviations: Step-through pattern;Decreased step length - right;Decreased step length - left;Shuffle Gait velocity: slowed Gait velocity interpretation: Below normal speed for age/gender General Gait Details: initiated gait with RW and min guard  assist as pain in R hand and arm increased no longer able to push RW with R UE and required minA for RW management and steadying, vc for upright posture and looking up and out with gait      Balance Overall balance assessment: Needs assistance Sitting-balance support: Feet supported Sitting balance-Leahy Scale: Good     Standing balance support: Single extremity supported Standing balance-Leahy Scale: Fair                               Pertinent Vitals/Pain Pain Assessment: 0-10 Pain Score: 7  Pain Location: R wrist and thumb, cervical spine. bilateral knees with ambulation Pain Descriptors / Indicators: Aching Pain Intervention(s): Limited activity within patient's tolerance;Monitored during session    Home Living Family/patient expects to be discharged to:: Skilled nursing facility Living Arrangements: Alone                    Prior Function Level of Independence: Independent (occasional use of AD for ambulation)                  Extremity/Trunk Assessment   Upper Extremity Assessment Upper Extremity Assessment: Defer to OT evaluation    Lower Extremity Assessment Lower Extremity Assessment: Generalized weakness    Cervical / Trunk Assessment Cervical / Trunk Assessment: Kyphotic  Communication   Communication: No difficulties  Cognition Arousal/Alertness: Awake/alert Behavior During Therapy: WFL for tasks assessed/performed Overall Cognitive Status: Within Functional Limits for tasks assessed  General Comments General comments (skin integrity, edema, etc.): VSS throughout session      Assessment/Plan    PT Assessment Patient needs continued PT services  PT Problem List Decreased strength;Decreased range of motion;Decreased activity tolerance;Decreased mobility;Cardiopulmonary status limiting activity;Pain       PT Treatment Interventions DME instruction;Gait training;Stair  training;Functional mobility training;Therapeutic activities;Therapeutic exercise;Balance training;Patient/family education    PT Goals (Current goals can be found in the Care Plan section)  Acute Rehab PT Goals Patient Stated Goal: have less pain in her arm PT Goal Formulation: With patient Time For Goal Achievement: 12/24/16 Potential to Achieve Goals: Fair    Frequency Min 3X/week   Barriers to discharge Decreased caregiver support         AM-PAC PT "6 Clicks" Daily Activity  Outcome Measure Difficulty turning over in bed (including adjusting bedclothes, sheets and blankets)?: Unable Difficulty moving from lying on back to sitting on the side of the bed? : Unable Difficulty sitting down on and standing up from a chair with arms (e.g., wheelchair, bedside commode, etc,.)?: Unable Help needed moving to and from a bed to chair (including a wheelchair)?: A Little Help needed walking in hospital room?: A Little Help needed climbing 3-5 steps with a railing? : A Lot 6 Click Score: 11    End of Session Equipment Utilized During Treatment: Gait belt Activity Tolerance: Patient limited by pain Patient left: in chair;with call bell/phone within reach Nurse Communication: Mobility status PT Visit Diagnosis: Other abnormalities of gait and mobility (R26.89);Muscle weakness (generalized) (M62.81);Difficulty in walking, not elsewhere classified (R26.2);Pain Pain - Right/Left: Right Pain - part of body: Hand;Arm    Time: 1520-1540 PT Time Calculation (min) (ACUTE ONLY): 20 min   Charges:   PT Evaluation $PT Eval Low Complexity: 1 Low     PT G Codes:        Andres Vest B. Migdalia Dk PT, DPT Acute Rehabilitation  [phone] Pager [phone]    [place] 12/10/2016, 4:00 PM

## 2016-12-11 LAB — BASIC METABOLIC PANEL
ANION GAP: 8 (ref 5–15)
BUN: 16 mg/dL (ref 6–20)
CHLORIDE: 100 mmol/L — AB (ref 101–111)
CO2: 27 mmol/L (ref 22–32)
Calcium: 8.3 mg/dL — ABNORMAL LOW (ref 8.9–10.3)
Creatinine, Ser: 0.91 mg/dL (ref 0.44–1.00)
GFR, EST NON AFRICAN AMERICAN: 56 mL/min — AB (ref >60)
Glucose, Bld: 101 mg/dL — ABNORMAL HIGH (ref 65–99)
POTASSIUM: 3.7 mmol/L (ref 3.5–5.1)
SODIUM: 135 mmol/L (ref 135–145)

## 2016-12-11 NOTE — Progress Notes (Signed)
CARDIAC REHAB PHASE I   PRE:  Rate/Rhythm: 80  BP:  Sitting: 149/57     SaO2: 98%  MODE:  Ambulation: 120 ft   POST:  Rate/Rhythm: 87  BP:  Sitting: 137/64     SaO2: 90%  10:11-10:35am Patient ambulated with x1 assist using walker. She stated that her legs felt very tired. Did not need to stop for rest breaks. Educated on Cardiac Rehab. Patient seemed indifferent. Back in chair with daughter at side.  [place], [place] 12/11/2016 10:30 AM

## 2016-12-11 NOTE — Plan of Care (Signed)
Problem: Safety: Goal: Ability to remain free from injury will improve Outcome: Progressing Discussed with patient proper safety precautions and to use call bell. Call bell within reach with patient in bed at this time.

## 2016-12-11 NOTE — Progress Notes (Addendum)
      [address]       [place] [address]             [phone]      4 Days Post-Op Procedure(s) (LRB): Possible, TRANSCATHETER AORTIC VALVE REPLACEMENT,CAROTID (N/A) TRANSESOPHAGEAL ECHOCARDIOGRAM (TEE) (N/A)   Subjective:  No new complaints.  Continues to complain of right hand swelling, stating its tender.  No BM  Objective: Vital signs in last 24 hours: Temp:  [98.8 F (37.1 C)-99.5 F (37.5 C)] 99.2 F (37.3 C) (10/20 0445) Pulse Rate:  [78-88] 78 (10/20 0445) Cardiac Rhythm: Normal sinus rhythm (10/20 0700) Resp:  [17-19] 19 (10/20 0445) BP: (134-154)/(57-58) 138/57 (10/20 0445) SpO2:  [93 %-98 %] 93 % (10/20 0445) Weight:  [166 lb 3.2 oz (75.4 kg)] 166 lb 3.2 oz (75.4 kg) (10/20 0445)  Intake/Output from previous day: 10/19 0701 - 10/20 0700 In: 480 [P.O.:480] Out: -   General appearance: alert, cooperative and no distress Heart: regular rate and rhythm Lungs: clear to auscultation bilaterally Abdomen: soft, non-tender; bowel sounds normal; no masses,  no organomegaly Extremities: RUE with ecchymosis along palmar aspect of wrist, mild edema of hand, no erythema present  Lab Results:  Recent Labs  12/09/16 0936  WBC 8.7  HGB 11.5*  HCT 36.2  PLT 138*   BMET:  Recent Labs  12/10/16 0909 12/11/16 0255  NA 138 135  K 4.0 3.7  CL 103 100*  CO2 27 27  GLUCOSE 131* 101*  BUN 12 16  CREATININE 0.92 0.91  CALCIUM 8.8* 8.3*    PT/INR: No results for input(s): LABPROT, INR in the last 72 hours. ABG    Component Value Date/Time   PHART 7.381 12/07/2016 1754   HCO3 29.4 (H) 12/07/2016 1754   TCO2 31 12/07/2016 1754   O2SAT 96.0 12/07/2016 1754   CBG (last 3)  No results for input(s): GLUCAP in the last 72 hours.  Assessment/Plan: S/P Procedure(s) (LRB): Possible, TRANSCATHETER AORTIC VALVE REPLACEMENT,CAROTID (N/A) TRANSESOPHAGEAL ECHOCARDIOGRAM (TEE) (N/A)  1. Severe AS- S/P TAVR, post operative Echo shows valve to be  functioning properly continue Plavix 2. CV- NSR with 1st AV block, BP controlled- on Norvasc 3. Hypothyroidism continue synthroid 4. RUE pain, ecchymosis, swelling- likely trauma from arterial line-- no infection present 5. Deconditioning- SNF placement 6. Dispo- patient stable, to SNF once bed is available and insurance approval is obtained   LOS: 4 days    Ahmed Prima, ERIN 12/11/2016   Chart reviewed, patient examined, agree with above.

## 2016-12-12 LAB — BASIC METABOLIC PANEL
ANION GAP: 9 (ref 5–15)
BUN: 16 mg/dL (ref 6–20)
CALCIUM: 8.6 mg/dL — AB (ref 8.9–10.3)
CO2: 27 mmol/L (ref 22–32)
Chloride: 101 mmol/L (ref 101–111)
Creatinine, Ser: 0.88 mg/dL (ref 0.44–1.00)
GFR, EST NON AFRICAN AMERICAN: 58 mL/min — AB (ref >60)
Glucose, Bld: 99 mg/dL (ref 65–99)
POTASSIUM: 3.9 mmol/L (ref 3.5–5.1)
Sodium: 137 mmol/L (ref 135–145)

## 2016-12-12 MED ORDER — MAGNESIUM GLUCONATE 500 MG PO TABS
1000.0000 mg | ORAL_TABLET | Freq: Every day | ORAL | Status: DC
  Administered 2016-12-12: 1000 mg via ORAL
  Filled 2016-12-12: qty 2

## 2016-12-12 MED ORDER — FLEET ENEMA 7-19 GM/118ML RE ENEM
1.0000 | ENEMA | Freq: Once | RECTAL | Status: AC
  Administered 2016-12-12: 1 via RECTAL
  Filled 2016-12-12: qty 1

## 2016-12-12 MED ORDER — AMLODIPINE BESYLATE 10 MG PO TABS
10.0000 mg | ORAL_TABLET | Freq: Every day | ORAL | Status: DC
  Administered 2016-12-12 – 2016-12-13 (×2): 10 mg via ORAL
  Filled 2016-12-12 (×2): qty 1

## 2016-12-12 NOTE — Clinical Social Work Note (Signed)
Clinical Social Work Assessment  Patient Details  Name: Becky Mccall MRN: [id number] Date of Birth: May 14, 1929  Date of referral:  12/12/16               Reason for consult:  Discharge Planning, Facility Placement                Permission sought to share information with:  Family Supports Permission granted to share information::  Yes, Verbal Permission Granted  Name::     Venia Carbon  Agency::  SNF  Relationship::  daughter  Contact Information:  [phone]  Housing/Transportation Living arrangements for the past 2 months:  [place] of Information:  Patient Patient Interpreter Needed:  None Criminal Activity/Legal Involvement Pertinent to Current Situation/Hospitalization:    Significant Relationships:  Adult Children, Other Family Members Lives with:  Self Do you feel safe going back to the place where you live?  Yes Need for family participation in patient care:  Yes (Comment)  Care giving concerns:  Patient had daughter and niece at bedside. Patient has support from family  Social Worker assessment / plan: Holiday representative met patient at bedside and patients family was present during assessment. Patient stated she is agreeable to discharge to SNF. Family stated they would prefer [facility] in [place] because patients daughter-in-law works at facility. Patient stated [facility] in [place] is holding a bed for her and to reach out to them for confirmation. CSW to follow up with family once facility has verified patients statement.  Employment status:  Retired Nurse, adult PT Recommendations:  [place] / Referral to community resources:  [place]  Patient/Family's Response to care:  Family supportive of patient and her needs  Patient/Family's Understanding of and Emotional Response to Diagnosis, Current Treatment, and Prognosis:  Patient agreeable to SNF discharge. Family would  like to take patient themselves to cut down on the cost.  Emotional Assessment Appearance:  Appears stated age Attitude/Demeanor/Rapport:  Other (appropriate) Affect (typically observed):  Pleasant, Calm, Appropriate Orientation:  Oriented to  Time, Oriented to Place, Oriented to Situation, Oriented to Self Alcohol / Substance use:  Not Applicable Psych involvement (Current and /or in the community):  No (Comment)  Discharge Needs  Concerns to be addressed:  No discharge needs identified Readmission within the last 30 days:  No Current discharge risk:  None Barriers to Discharge:  No Barriers Identified   Wende Neighbors, LCSW 12/12/2016, 5:17 PM

## 2016-12-12 NOTE — Evaluation (Signed)
Occupational Therapy Evaluation Patient Details Name: Becky Mccall MRN: [id number] DOB: [birth year] Today's Date: 12/12/2016    History of Present Illness Becky Mccall is a 83 y.o. female with a history of HTN, chronic diastolic CHF, COPD, hypothyroidism, GE reflux, HLD, and severe aortic stenosis who presented to [facility]. s/p  TAVR 12/07/16   Clinical Impression   PTA Pt was independent in ADL and mobility (PRN use of AD). Pt is currently mod A for ADL, and min A for mobility with RW. Pt is limited by pain and function of RUE due to wrist. Please see OT problem list below. Pt will benefit from skilled OT in the acute setting and afterwards at the SNF level to maximize safety and independence in ADL/functional transfers, safety with DME, and functionality of RUE. Next session to establish HEP for RUE to assist with edema management as well as reinforce elevation and ice.     Follow Up Recommendations  SNF    Equipment Recommendations  Other (comment) (defer to next venue)    Recommendations for Other Services       Precautions / Restrictions Precautions Precautions: None Restrictions Weight Bearing Restrictions: No      Mobility Bed Mobility Overal bed mobility: Needs Assistance Bed Mobility: Sit to Supine       Sit to supine: Min guard   General bed mobility comments: HOB elevated, no assist for BLE into bed  Transfers Overall transfer level: Needs assistance Equipment used: Rolling walker (2 wheeled) Transfers: Sit to/from Stand Sit to Stand: Min assist         General transfer comment: minA for powerup, good steadying in standing, vc for hand placement    Balance Overall balance assessment: Needs assistance Sitting-balance support: Feet supported Sitting balance-Leahy Scale: Good     Standing balance support: Single extremity supported Standing balance-Leahy Scale: Fair Standing balance comment: Pt able to static stand with no support for sink  level grooming                           ADL either performed or assessed with clinical judgement   ADL Overall ADL's : Needs assistance/impaired Eating/Feeding: Set up;Sitting   Grooming: Wash/dry hands;Min guard;Standing;Moderate assistance Grooming Details (indicate cue type and reason): sink level, Pt requires mod A for tasks that require BUE  Upper Body Bathing: Minimal assistance   Lower Body Bathing: Moderate assistance   Upper Body Dressing : Minimal assistance   Lower Body Dressing: Moderate assistance   Toilet Transfer: Min guard;Ambulation;RW Toilet Transfer Details (indicate cue type and reason): Pt does not put RUE on RW despite cues, so OT pushed walker for Pt during ambulation Toileting- Clothing Manipulation and Hygiene: Min guard;Sit to/from stand Toileting - Clothing Manipulation Details (indicate cue type and reason): Pt uses grab bars for stability     Functional mobility during ADLs: Cueing for sequencing;Rolling walker;Cueing for safety;Minimal assistance (min A to assist with RW as Pt will not WB through RUE) General ADL Comments: bilateral hand tasks are very difficult right now as well as Pt is R hand dominant.     Vision Patient Visual Report: No change from baseline       Perception     Praxis      Pertinent Vitals/Pain Pain Assessment: 0-10 Pain Score: 4  (with movement) Pain Location: R wrist and thumb Pain Descriptors / Indicators: Aching;Discomfort;Grimacing Pain Intervention(s): Monitored during session;Repositioned     Hand Dominance Right  Extremity/Trunk Assessment Upper Extremity Assessment Upper Extremity Assessment: RUE deficits/detail RUE Deficits / Details: edema noted (improved per RN), Pt grossly 3/5 strength in digits, approx 10 degrees wrist flexion and then pain is too much. elbow and shoulder WFL - generalized weakness RUE: Unable to fully assess due to pain RUE Coordination: decreased fine motor;decreased  gross motor   Lower Extremity Assessment Lower Extremity Assessment: Defer to PT evaluation   Cervical / Trunk Assessment Cervical / Trunk Assessment: Kyphotic   Communication Communication Communication: No difficulties   Cognition Arousal/Alertness: Awake/alert Behavior During Therapy: WFL for tasks assessed/performed Overall Cognitive Status: Within Functional Limits for tasks assessed                                     General Comments  Pt educated in elevation of RUE as well as ice, and finger movement for edema management    Exercises     Shoulder Instructions      Home Living Family/patient expects to be discharged to:: Skilled nursing facility Living Arrangements: Alone                                      Prior Functioning/Environment Level of Independence: Independent (occasional use of AD for ambulation)                 OT Problem List: Decreased strength;Decreased range of motion;Decreased activity tolerance;Impaired balance (sitting and/or standing);Decreased knowledge of use of DME or AE;Impaired UE functional use;Pain      OT Treatment/Interventions: Self-care/ADL training;DME and/or AE instruction;Therapeutic activities;Patient/family education;Balance training    OT Goals(Current goals can be found in the care plan section) Acute Rehab OT Goals Patient Stated Goal: go to rehab before home to maximize independence OT Goal Formulation: With patient Time For Goal Achievement: 12/26/16 Potential to Achieve Goals: Good ADL Goals Pt Will Perform Grooming: with supervision;standing Pt Will Transfer to Toilet: with supervision;ambulating (safely with DME) Pt Will Perform Toileting - Clothing Manipulation and hygiene: with modified independence;sit to/from stand Pt/caregiver will Perform Home Exercise Program: Right Upper extremity;Independently;With written HEP provided Additional ADL Goal #1: Pt will verbalize and  demonstrate the importance of elevation, exercise, and ice in edema management of R wrist/hand  OT Frequency: Min 2X/week   Barriers to D/C: Decreased caregiver support  Pt lives alone - daughter had recent hand surgery       Co-evaluation              AM-PAC PT "6 Clicks" Daily Activity     Outcome Measure Help from another person eating meals?: A Little Help from another person taking care of personal grooming?: A Little Help from another person toileting, which includes using toliet, bedpan, or urinal?: A Little Help from another person bathing (including washing, rinsing, drying)?: A Little Help from another person to put on and taking off regular upper body clothing?: A Little Help from another person to put on and taking off regular lower body clothing?: A Little 6 Click Score: 18   End of Session Equipment Utilized During Treatment: Rolling walker Nurse Communication: Mobility status  Activity Tolerance: Patient tolerated treatment well Patient left: in bed;with call bell/phone within reach;with bed alarm set  OT Visit Diagnosis: Unsteadiness on feet (R26.81);Muscle weakness (generalized) (M62.81);Pain Pain - Right/Left: Right Pain - part of body: Arm;Hand  Time: 8478-4128 OT Time Calculation (min): 16 min Charges:  OT General Charges $OT Visit: 1 Visit OT Evaluation $OT Eval Low Complexity: 1 Low G-Codes:     Hulda Humphrey OTR/L [place] 12/12/2016, 11:08 AM

## 2016-12-12 NOTE — Progress Notes (Addendum)
      [address]       [place] [address]             [phone]      5 Days Post-Op Procedure(s) (LRB): Possible, TRANSCATHETER AORTIC VALVE REPLACEMENT,CAROTID (N/A) TRANSESOPHAGEAL ECHOCARDIOGRAM (TEE) (N/A)   Subjective:  Complains of constipation.  Thinks the magnesium tablets she is receiving here dont work for her.  She continues to have discomfort in her right wrist.  + flatus, no BM  Objective: Vital signs in last 24 hours: Temp:  [98.2 F (36.8 C)-99.5 F (37.5 C)] 98.2 F (36.8 C) (10/21 0805) Pulse Rate:  [68-88] 72 (10/21 0805) Cardiac Rhythm: Heart block;Normal sinus rhythm (10/21 0700) Resp:  [12-20] 12 (10/21 0805) BP: (128-174)/(56-87) 153/70 (10/21 0805) SpO2:  [95 %-99 %] 99 % (10/21 0805) Weight:  [77 lb 14.4 oz (35.3 kg)-78 lb 1.6 oz (35.4 kg)] 77 lb 14.4 oz (35.3 kg) (10/21 0500)  Intake/Output from previous day: 10/20 0701 - 10/21 0700 In: 120 [P.O.:120] Out: -   General appearance: alert, cooperative and no distress Heart: regular rate and rhythm Lungs: clear to auscultation bilaterally Abdomen: soft, non-tender; bowel sounds normal; no masses,  no organomegaly Extremities: no edema, laceration on left leg stable, no evidence on infection Wound: clean and dry  Lab Results:  Recent Labs  12/09/16 0936  WBC 8.7  HGB 11.5*  HCT 36.2  PLT 138*   BMET:  Recent Labs  12/11/16 0255 12/12/16 0400  NA 135 137  K 3.7 3.9  CL 100* 101  CO2 27 27  GLUCOSE 101* 99  BUN 16 16  CREATININE 0.91 0.88  CALCIUM 8.3* 8.6*    PT/INR: No results for input(s): LABPROT, INR in the last 72 hours. ABG    Component Value Date/Time   PHART 7.381 12/07/2016 1754   HCO3 29.4 (H) 12/07/2016 1754   TCO2 31 12/07/2016 1754   O2SAT 96.0 12/07/2016 1754   CBG (last 3)  No results for input(s): GLUCAP in the last 72 hours.  Assessment/Plan: S/P Procedure(s) (LRB): Possible, TRANSCATHETER AORTIC VALVE REPLACEMENT,CAROTID  (N/A) TRANSESOPHAGEAL ECHOCARDIOGRAM (TEE) (N/A)  1. CV-NSR with 1st degree HB, + HTN- will increase home Norvasc to 10 mg daily 2. Pulm- no acute issues, continue IS 3. GI- constipation- will order Enema per patient's request, will change magnesium supplement to what patient is using at home to see if that provides bowel relief as well 4. Right wrist pain- ecchymosis from arterial line, no erythema or infection- continue symptomatic relief 5. Deconditioning- to SNF once insurance authorization obtained 6. Dispo- patient stable, increase Norvasc for HTN, enema for constipation, continue current care, ready for SNF once insurance authorization comes through   LOS: 5 days    Ahmed Prima, ERIN 12/12/2016   Chart reviewed, patient examined, agree with above. She has a lot of complaints but is doing well overall. Ready to go to SNF when bed available.

## 2016-12-13 DIAGNOSIS — I5032 Chronic diastolic (congestive) heart failure: Secondary | ICD-10-CM | POA: Diagnosis not present

## 2016-12-13 DIAGNOSIS — J449 Chronic obstructive pulmonary disease, unspecified: Secondary | ICD-10-CM | POA: Diagnosis not present

## 2016-12-13 DIAGNOSIS — R0989 Other specified symptoms and signs involving the circulatory and respiratory systems: Secondary | ICD-10-CM | POA: Diagnosis not present

## 2016-12-13 DIAGNOSIS — E039 Hypothyroidism, unspecified: Secondary | ICD-10-CM | POA: Diagnosis not present

## 2016-12-13 DIAGNOSIS — Z48812 Encounter for surgical aftercare following surgery on the circulatory system: Secondary | ICD-10-CM | POA: Diagnosis not present

## 2016-12-13 DIAGNOSIS — Z952 Presence of prosthetic heart valve: Secondary | ICD-10-CM | POA: Diagnosis not present

## 2016-12-13 DIAGNOSIS — R5381 Other malaise: Secondary | ICD-10-CM | POA: Diagnosis not present

## 2016-12-13 DIAGNOSIS — I352 Nonrheumatic aortic (valve) stenosis with insufficiency: Secondary | ICD-10-CM | POA: Diagnosis not present

## 2016-12-13 DIAGNOSIS — E785 Hyperlipidemia, unspecified: Secondary | ICD-10-CM | POA: Diagnosis not present

## 2016-12-13 LAB — BASIC METABOLIC PANEL
Anion gap: 8 (ref 5–15)
BUN: 13 mg/dL (ref 6–20)
CHLORIDE: 101 mmol/L (ref 101–111)
CO2: 28 mmol/L (ref 22–32)
CREATININE: 0.9 mg/dL (ref 0.44–1.00)
Calcium: 8.4 mg/dL — ABNORMAL LOW (ref 8.9–10.3)
GFR calc non Af Amer: 57 mL/min — ABNORMAL LOW (ref >60)
Glucose, Bld: 113 mg/dL — ABNORMAL HIGH (ref 65–99)
POTASSIUM: 4 mmol/L (ref 3.5–5.1)
SODIUM: 137 mmol/L (ref 135–145)

## 2016-12-13 MED ORDER — AMLODIPINE BESYLATE 10 MG PO TABS: 10.0000 mg | ORAL_TABLET | Freq: Every day | ORAL | 1 refills | Status: DC

## 2016-12-13 NOTE — Progress Notes (Signed)
Pt discharged per order. IV and telemetry box removed. Pt to be transported by daughter to [facility] in [place], per pt request. Pt's daughter received AVS and was instructed to give it to the facility at drop-off. Pt left with all of her belongings. Pt left via wheelchair and was accompanied by pt's nurse tech and pt's daughter.   Grant Fontana BSN, RN

## 2016-12-13 NOTE — NC FL2 (Signed)
[place] LEVEL OF CARE SCREENING TOOL     IDENTIFICATION  Patient Name: Becky Mccall Birthdate: 1930-01-06 Sex: female Admission Date (Current Location): 12/07/2016  [facility] and [place] Number:  Herbalist and Address:  [facility], [place] [address] [place], [place] [address]      Provider Number: [id number]  Attending Physician Name and Address:  Rexene Alberts, MD  Relative Name and Phone Number:  Venia Carbon, [phone]    Current Level of Care: Hospital Recommended Level of Care: [place] Prior Approval Number:    Date Approved/Denied:   PASRR Number: [id number]  Discharge Plan: SNF    Current Diagnoses: Patient Active Problem List   Diagnosis Date Noted  . S/P TAVR (transcatheter aortic valve replacement) 12/07/2016  . Severe aortic stenosis 12/07/2016  . Fibrocystic breast disease   . IBS (irritable bowel syndrome)   . Cervical spondylolysis   . Osteopenia   . Vitamin D deficiency   . DDD (degenerative disc disease), lumbar   . Squamous cell skin cancer   . Carotid stenosis   . OSA (obstructive sleep apnea)   . Colon polyp   . Hard of hearing   . Hyperlipidemia   . COPD (chronic obstructive pulmonary disease) ([place])   . Hypothyroidism   . GERD (gastroesophageal reflux disease)   . Spinal stenosis in cervical region 02/19/2013    Orientation RESPIRATION BLADDER Height & Weight     Self, Time, Situation, Place  Normal Continent Weight: 175 lb 0.7 oz (79.4 kg) Height:  5\' 6"  (167.6 cm)  BEHAVIORAL SYMPTOMS/MOOD NEUROLOGICAL BOWEL NUTRITION STATUS      Continent Diet (heart room)  AMBULATORY STATUS COMMUNICATION OF NEEDS Skin   Limited Assist Verbally Normal                       Personal Care Assistance Level of Assistance  Bathing, Feeding, Dressing Bathing Assistance: Limited assistance Feeding assistance: Independent Dressing Assistance: Limited assistance      Functional Limitations Info  Sight, Hearing, Speech Sight Info: Adequate Hearing Info: Impaired (wears hearing aide in both ears) Speech Info: Adequate    SPECIAL CARE FACTORS FREQUENCY  PT (By licensed PT), OT (By licensed OT)     PT Frequency: 5x wk OT Frequency: 5x wk            Contractures Contractures Info: Not present    Additional Factors Info  Code Status, Allergies Code Status Info: Full Code Allergies Info: LISINOPRIL, RELAFEN NABUMETONE, STATINS, TAPE, WELCHOL COLESEVELAM, ZETIA EZETIMIBE, NEOSPORIN NEOMYCIN-BACITRACIN ZN-POLYMYX, ZINC OXIDE           Current Medications (12/13/2016):  This is the current hospital active medication list Current Facility-Administered Medications  Medication Dose Route Frequency Provider Last Rate Last Dose  . acetaminophen (TYLENOL) tablet 650 mg  650 mg Oral Q6H PRN [name], MD   650 mg at 12/10/16 0600  . amLODipine (NORVASC) tablet 10 mg  10 mg Oral Daily Barrett, Erin R, PA-C   10 mg at 12/13/16 0829  . aspirin EC tablet 81 mg  81 mg Oral Daily [name], MD   81 mg at 12/13/16 3875  . calcium-vitamin D (OSCAL WITH D) 500-200 MG-UNIT per tablet 1 tablet  1 tablet Oral Q breakfast Rexene Alberts, MD   1 tablet at 12/13/16 0751  . cholecalciferol (VITAMIN D) tablet 6,000 Units  6,000 Units Oral Daily [name], MD  6,000 Units at 12/13/16 0827  . clopidogrel (PLAVIX) tablet 75 mg  75 mg Oral Q breakfast [name], MD   75 mg at 12/13/16 0751  . levothyroxine (SYNTHROID, LEVOTHROID) tablet 137 mcg  137 mcg Oral QAC breakfast [name], MD   137 mcg at 12/13/16 0751  . magnesium gluconate (MAGONATE) tablet 1,000 mg  1,000 mg Oral QHS Barrett, Erin R, PA-C   1,000 mg at 12/12/16 2101  . meclizine (ANTIVERT) tablet 12.5-25 mg  12.5-25 mg Oral Q6H PRN [name], MD      . metoprolol tartrate (LOPRESSOR) injection 2.5-5 mg  2.5-5 mg Intravenous Q2H PRN [name], MD   5 mg at 12/11/16  1930  . multivitamin with minerals tablet 1 tablet  1 tablet Oral Daily [name], MD   1 tablet at 12/13/16 [phone]  . omega-3 acid ethyl esters (LOVAZA) capsule 1 g  1 g Oral TID [name], MD   1 g at 12/13/16 6073  . ondansetron (ZOFRAN) injection 4 mg  4 mg Intravenous Q6H PRN [name], MD      . pantoprazole (PROTONIX) EC tablet 40 mg  40 mg Oral Daily [name], MD   40 mg at 12/13/16 7106  . traMADol (ULTRAM) tablet 50 mg  50 mg Oral Q8H PRN Eileen Stanford, PA-C   50 mg at 12/13/16 2694     Discharge Medications: Please see discharge summary for a list of discharge medications.  Relevant Imaging Results:  Relevant Lab Results:   Additional Information SS# [id number]  Wende Neighbors, LCSW

## 2016-12-13 NOTE — Progress Notes (Signed)
CARDIAC REHAB PHASE I   PRE:  Rate/Rhythm: 74 SR  BP:  Supine:   Sitting: 150/55  Standing:    SaO2: 96%RA  MODE:  Ambulation: 260 ft   POST:  Rate/Rhythm: 85 SR  BP:  Supine: 172/65  Sitting:   Standing:    SaO2: 97%RA 1030-1059 Pt walked 260 ft on RA with rolling walker, gait belt use and asst x 1. Pt tolerated well. Still cannot grip walker with right hand but hand is getting less painful. Encouraged pt to watch salt in diet and to walk with assistance. Daughter in room.   Graylon Good, RN BSN  12/13/2016 10:54 AM

## 2016-12-13 NOTE — Care Management Important Message (Signed)
Important Message  Patient Details  Name: Becky Mccall MRN: [id number] Date of Birth: [birth year]   Medicare Important Message Given:  Yes    Becky Mccall 12/13/2016, 11:23 AM

## 2016-12-13 NOTE — Progress Notes (Signed)
[facility] VALVE TEAM  Patient Name: Becky Mccall Date of Encounter: 12/13/2016  Primary Cardiologist: Dr. [name] / Dr Burt Knack (TAVR)  Hospital Problem List     Principal Problem:   S/P TAVR (transcatheter aortic valve replacement) Active Problems:   Hyperlipidemia   COPD (chronic obstructive pulmonary disease) (HCC)   Hypothyroidism   GERD (gastroesophageal reflux disease)   IBS (irritable bowel syndrome)   Carotid stenosis   OSA (obstructive sleep apnea)   Severe aortic stenosis     Subjective   No complaints this AM. She has been walking around and thinks her breathing has improved. Ready to go to SNF.  Inpatient Medications    Scheduled Meds: . amLODipine  10 mg Oral Daily  . aspirin EC  81 mg Oral Daily  . calcium-vitamin D  1 tablet Oral Q breakfast  . cholecalciferol  6,000 Units Oral Daily  . clopidogrel  75 mg Oral Q breakfast  . levothyroxine  137 mcg Oral QAC breakfast  . magnesium gluconate  1,000 mg Oral QHS  . multivitamin with minerals  1 tablet Oral Daily  . omega-3 acid ethyl esters  1 g Oral TID  . pantoprazole  40 mg Oral Daily   Continuous Infusions:  PRN Meds: acetaminophen, meclizine, metoprolol tartrate, ondansetron (ZOFRAN) IV, traMADol   Vital Signs    Vitals:   12/12/16 0805 12/12/16 1207 12/13/16 0227 12/13/16 0533  BP: (!) 153/70 133/61 (!) 146/61   Pulse: 72 77    Resp: 12 16 17    Temp: 98.2 F (36.8 C) 98.9 F (37.2 C) 98.7 F (37.1 C)   TempSrc: Oral Oral Oral   SpO2: 99% 98%    Weight:    175 lb 0.7 oz (79.4 kg)  Height:        Intake/Output Summary (Last 24 hours) at 12/13/16 0826 Last data filed at 12/12/16 1845  Gross per 24 hour  Intake              360 ml  Output                0 ml  Net              360 ml   Filed Weights   12/12/16 0422 12/12/16 0500 12/13/16 0533  Weight: 78 lb 1.6 oz (35.4 kg) 77 lb 14.4 oz (35.3 kg) 175 lb 0.7 oz (79.4 kg)    Physical  Exam   GEN: Well nourished, well developed, in no acute distress. Elderly and frail. HOH HEENT: Grossly normal.  Neck: Supple, no JVD, carotid bruits, or masses. Cardiac: RRR, no murmurs, rubs, or gallops. No clubbing, cyanosis, LE edema.  Radials/DP/PT 2+ and equal bilaterally.  Respiratory:  Respirations regular and unlabored, clear to auscultation bilaterally. GI: Soft, nontender, nondistended, BS + x 4. MS: no deformity or atrophy. Skin: warm and dry, no rash. Right arm with no ecchymosis and improving edema. Tender to palpation and movement. Neuro:  Strength and sensation are intact. Psych: AAOx3.  Normal affect.  Labs    CBC No results for input(s): WBC, NEUTROABS, HGB, HCT, MCV, PLT in the last 72 hours. Basic Metabolic Panel  Recent Labs  12/12/16 0400 12/13/16 0227  NA 137 137  K 3.9 4.0  CL 101 101  CO2 27 28  GLUCOSE 99 113*  BUN 16 13  CREATININE 0.88 0.90  CALCIUM 8.6* 8.4*   Liver Function Tests No results for input(s): AST, ALT,  ALKPHOS, BILITOT, PROT, ALBUMIN in the last 72 hours. No results for input(s): LIPASE, AMYLASE in the last 72 hours. Cardiac Enzymes No results for input(s): CKTOTAL, CKMB, CKMBINDEX, TROPONINI in the last 72 hours. BNP Invalid input(s): POCBNP D-Dimer No results for input(s): DDIMER in the last 72 hours. Hemoglobin A1C No results for input(s): HGBA1C in the last 72 hours. Fasting Lipid Panel No results for input(s): CHOL, HDL, LDLCALC, TRIG, CHOLHDL, LDLDIRECT in the last 72 hours. Thyroid Function Tests No results for input(s): TSH, T4TOTAL, T3FREE, THYROIDAB in the last 72 hours.  Invalid input(s): FREET3  Telemetry    Sinus - Personally Reviewed  ECG    sinus with 1st AV block. - Personally Reviewed  Radiology    No results found.  Cardiac Studies   Postoperative echo 12/08/16 Study Conclusions - Left ventricle: The cavity size was normal. Systolic function was   normal. The estimated ejection fraction was  in the range of 60%   to 65%. Wall motion was normal; there were no regional wall   motion abnormalities. Doppler parameters are consistent with   abnormal left ventricular relaxation (grade 1 diastolic   dysfunction). Doppler parameters are consistent with high   ventricular filling pressure. - Aortic valve: A bioprosthesis was present. Valve area (VTI): 2.57   cm^2. Valve area (Vmax): 2.36 cm^2. Valve area (Vmean): 2.56   cm^2. - Mitral valve: Calcified annulus. Mildly thickened leaflets . Impressions: - Limited study post TAVR; normal LV systolic function; mild   diastolic dysfunction with elevated LV filling pressure; s/p AVR   with normal mean gradient of 9 mmHg and no AI.  Carotid artery dopplers: 12/09/16 Summary: - The left vertebral artery appears patent with antegrade flow. - Findings consistent with a 1- 64 percent stenosis involving the left internal carotid artery. Elevated systolic velocities noted throughout left carotid system.  Patient Profile     Becky Mccall is a 83 y.o. female with a history of HTN, chronic diastolic CHF, COPD, hypothyroidism, GE reflux, HLD, and severe aortic stenosis who presented to [facility] on 12/07/16 for planned TAVR.   Assessment & Plan    Severe AS: s/p successful TAVR with a 26 mm Edwards Sapien 3 THV placed via open left transcarotid approach. Post operative echo showed good valve placement with no PVL. ECG with sinus with 1st AV block. Carotid site is stable. Continue ASA/plavix. She is very deconditioned and has mobility issues with right arm pain and swelling. She is ready for discharge pending insurance approval of SNF placement at [facility].   Right arm pain and swelling: improving. Likely related to arterial line.   Altered mental status: now resolved. Head CT 10/18 was non acute and carotid doppler showed stable 1-39% stenosis without thrombosis. All narcotics were discontinued.   Post op anemia: expected post operatively.     HTN: BP better controlled on increased amlodipine to 10mg  daily.   Hypothyroidism: continue synthroid  GERD: continue PPI    Signed, Angelena Form, PA-C  12/13/2016, 8:26 AM  Pager [phone]  I have seen and examined the patient and agree with the assessment and plan as outlined.  Rexene Alberts, MD 12/13/2016 9:22 AM

## 2016-12-13 NOTE — Progress Notes (Signed)
Physical Therapy Treatment Patient Details Name: Becky Mccall MRN: [id number] DOB: December 08, 1929 Today's Date: 12/13/2016    History of Present Illness Becky Mccall is a 83 y.o. female with a history of HTN, chronic diastolic CHF, COPD, hypothyroidism, GE reflux, HLD, and severe aortic stenosis who presented to [facility]. s/p  TAVR 12/07/16    PT Comments    Patient is making progress toward mobility goals. Pt continues to have R wrist and hand pain limiting ability to grip. Pt required assistance to manage RW and would likely benefit from R platform RW while R UE is still painful. Pt reported bilat LE heaviness and with decreased bilat step lengths/heel strike. Continue to progress as tolerated with anticipated d/c to SNF for further skilled PT services.     Follow Up Recommendations  SNF     Equipment Recommendations  Other (comment) (to be determined at next venue)    Recommendations for Other Services OT consult     Precautions / Restrictions Precautions Precautions: None Restrictions Weight Bearing Restrictions: No    Mobility  Bed Mobility               General bed mobility comments: pt OOB in chair upon arrival  Transfers Overall transfer level: Needs assistance Equipment used: Rolling walker (2 wheeled) Transfers: Sit to/from Stand Sit to Stand: Min guard         General transfer comment: min guard for safety  Ambulation/Gait Ambulation/Gait assistance: Min assist Ambulation Distance (Feet): 180 Feet Assistive device: Rolling walker (2 wheeled) Gait Pattern/deviations: Step-through pattern;Decreased step length - right;Decreased step length - left;Decreased dorsiflexion - right;Decreased dorsiflexion - left Gait velocity: decreased   General Gait Details: pt with decreased bilat step lengths and reported heaviness in bilat LE; pt required assistance to manage RW as she continues to have difficulty with R hand grip; cues for proximity of RW and  bilat heel strike   Stairs            Wheelchair Mobility    Modified Rankin (Stroke Patients Only)       Balance Overall balance assessment: Needs assistance Sitting-balance support: Feet supported Sitting balance-Leahy Scale: Good     Standing balance support: Single extremity supported Standing balance-Leahy Scale: Fair Standing balance comment: pt able to static stand without UE support                            Cognition Arousal/Alertness: Awake/alert Behavior During Therapy: WFL for tasks assessed/performed Overall Cognitive Status: Within Functional Limits for tasks assessed                                        Exercises Hand Exercises Forearm Supination: AROM;Right;10 reps Wrist Flexion: AROM;Right;5 reps Digit Composite Flexion: AROM;Right;10 reps Composite Extension: AROM;Right;10 reps    General Comments General comments (skin integrity, edema, etc.): R hand edema and erythema      Pertinent Vitals/Pain Pain Assessment: Faces Faces Pain Scale: Hurts little more Pain Location: R wrist and hand Pain Descriptors / Indicators: Aching;Discomfort;Grimacing;Guarding;Sore Pain Intervention(s): Limited activity within patient's tolerance;Monitored during session;Repositioned    Home Living                      Prior Function            PT Goals (current goals can now be  found in the care plan section) Acute Rehab PT Goals Patient Stated Goal: go to rehab before home to maximize independence PT Goal Formulation: With patient Time For Goal Achievement: 12/24/16 Potential to Achieve Goals: Fair Progress towards PT goals: Progressing toward goals    Frequency    Min 3X/week      PT Plan Current plan remains appropriate    Co-evaluation              AM-PAC PT "6 Clicks" Daily Activity  Outcome Measure  Difficulty turning over in bed (including adjusting bedclothes, sheets and blankets)?:  Unable Difficulty moving from lying on back to sitting on the side of the bed? : Unable Difficulty sitting down on and standing up from a chair with arms (e.g., wheelchair, bedside commode, etc,.)?: A Lot Help needed moving to and from a bed to chair (including a wheelchair)?: A Little Help needed walking in hospital room?: A Little Help needed climbing 3-5 steps with a railing? : A Lot 6 Click Score: 12    End of Session Equipment Utilized During Treatment: Gait belt Activity Tolerance: Patient limited by pain Patient left: in chair;with call bell/phone within reach;with family/visitor present Nurse Communication: Mobility status PT Visit Diagnosis: Other abnormalities of gait and mobility (R26.89);Muscle weakness (generalized) (M62.81);Difficulty in walking, not elsewhere classified (R26.2);Pain Pain - Right/Left: Right Pain - part of body: Hand;Arm     Time: 1333-1350 PT Time Calculation (min) (ACUTE ONLY): 17 min  Charges:  $Gait Training: 8-22 mins                    G Codes:       Earney Navy, PTA Pager: [phone]     Darliss Cheney 12/13/2016, 2:02 PM

## 2016-12-13 NOTE — Progress Notes (Signed)
Clinical Social Worker facilitated patient discharge including contacting patient family and facility to confirm patient discharge plans.  Clinical information faxed to facility and family agreeable with plan.  CSW arranged ambulance transport via [facility] to [facility] in [place] .  RN to call [phone] Ext [phone] (pt will go in rm [phone]) for report prior to discharge.  Clinical Social Worker will sign off for now as social work intervention is no longer needed. Please consult [place] again if new need arises.  Rhea Pink, MSW, [place]

## 2016-12-14 ENCOUNTER — Telehealth (HOSPITAL_COMMUNITY): Payer: Self-pay

## 2016-12-14 ENCOUNTER — Telehealth: Payer: Self-pay

## 2016-12-14 ENCOUNTER — Encounter: Payer: Self-pay | Admitting: Physician Assistant

## 2016-12-14 NOTE — Telephone Encounter (Signed)
I spoke with Nira Conn the nurse who is taking care of Becky Mccall at [facility], [phone] Ext [phone], regarding discharge from [facility] on 12/13/2016.  Nira Conn is aware that the pt has a pending appointment with provider Angelena Form PA-C on 12/22/2016 at 2:30 PM at [address] office. Patient understands discharge instructions? yes Patient understands medications and regiment? yes Patient understands to bring all medications to this visit? yes  Nira Conn has no additional questions about the pt's discharge paperwork or medications.  I have given her the phone number for structural heart team if any questions or concerns arise.

## 2016-12-14 NOTE — Telephone Encounter (Signed)
Patient insurance is active and benefits verified. Patient insurance is BCBS - no co-payment, no deductible, out of pocket $5500/$599.28 has been met, no co-insurance and no pre-authorization. Passport/reference [phone].   Patient is scheduled for a follow up appointment with the cardiologist on 12/22/16.   *Referral is currently on hold until further notice, patient was discharged to SNF - [facility] in [place].*

## 2016-12-15 ENCOUNTER — Encounter: Payer: Self-pay | Admitting: Thoracic Surgery (Cardiothoracic Vascular Surgery)

## 2016-12-15 LAB — CULTURE, BLOOD (ROUTINE X 2)
CULTURE: NO GROWTH
Culture: NO GROWTH
SPECIAL REQUESTS: ADEQUATE
Special Requests: ADEQUATE

## 2016-12-16 ENCOUNTER — Ambulatory Visit: Payer: Medicare Other | Admitting: Physician Assistant

## 2016-12-20 NOTE — Progress Notes (Signed)
[place] [facility] TEAM    Cardiology Office Note    Date:  12/22/2016   ID:  Becky Mccall, DOB March 21, 1929, MRN [id number]  PCP:  [name], MD  Cardiologist:  Dr. [name] / Dr Burt Knack (TAVR)  CC: TOC follow up s/p TAVR   History of Present Illness:  Becky Mccall is a 83 y.o. female with a history of HTN, chronic diastolic CHF, COPD, hypothyroidism, GE reflux, HLD, and severe aortic stenosis s/p TAVR (12/07/16) who presents to clinic for post hospital follow up.  Becky Mccall reported progressive dyspnea and chest tightness. 2D echo 11/01/16 showed moderate LVH, normal LV function, grade 1 diastolic dysfunction, high ventricular filling pressure, severe aortic stenosis, mild mitral regurgitation, severely dilated left atrium, mild TR. Due to her progression in aortic stenosis, she underwent [facility] 11/15/16 showing normal LVEDP, mild nonobstructive CAD (30% prox RCA, 40% ostial RPDA, 45% D1, 45% dLAD), severe AS with mean gradient 40.87mmHg, AVA 0.76cm2. She was evaluated by Dr. [name] and felt the patient would be at least moderate risk for conventional surgical aortic valve replacement. The patient has severe atherosclerotic disease and was not felt to have adequate pelvic vascular access for a transfemoral approach for TAVR.  She ultimately underwent successful TAVR with a 26 mm Edwards Sapien 3 THV placed via open left transcarotid approach. Post operative echo showed good valve placement with no PVL. Her post op course was complicated by AMS (head CT negative) and right arm pain and swelling felt to be related to arterial line placement. She was severely deconditioned and discharged to a SNF. She was discharged on ASA/plavix.    Today she presents to clinic for follow up. She is doing great. Feeling much better. Left [place] today. Right arm is feeling so much better. No CP or SOB. No LE edema, orthopnea or PND. No dizziness or syncope. No  blood in stool or urine. No palpitations. Has an infection on left shin that is being treated with Keflex. She hasn't done any dedicated walking but "isn;t a couch potato" and up doing all the things she wants to.    Past Medical History:  Diagnosis Date  . CAD in native artery    a.  [facility] 11/15/16 showing normal LVEDP, mild nonobstructive CAD (30% prox RCA, 40% ostial RPDA, 45% D1, 45% dLAD), severe AS with mean gradient 40.65mmHg, AVA 0.76cm2.   . Carotid stenosis   . Cervical spondylolysis   . Chronic lower back pain   . CKD (chronic kidney disease), stage III ([place])   . COPD (chronic obstructive pulmonary disease) ([place])    "mild" (12/09/2016)  . DDD (degenerative disc disease), lumbar   . GERD (gastroesophageal reflux disease)   . Hard of hearing    wears hearing aides both ears  . Hyperlipidemia    can't take the meds so takes Fish Oil  . Hypertension   . Hypothyroidism   . IBS (irritable bowel syndrome)   . Intermittent dysphagia    for pills and solids  . Osteopenia   . S/P TAVR (transcatheter aortic valve replacement) 12/07/2016   26 mm Edwards Sapien 3 transcatheter heart valve placed via open left transcarotid approach   . Severe aortic stenosis     Past Surgical History:  Procedure Laterality Date  . ABDOMINAL HYSTERECTOMY    . APPENDECTOMY    . BACK SURGERY  2009   "on her lower back; not sure what they did" (12/09/2016)  .  BLEPHAROPLASTY Bilateral   . CARDIAC CATHETERIZATION    . CARDIAC VALVE REPLACEMENT  12/07/2016   Edwards Sapien 3 THV (size 26 mm, model # 9600TFX, serial # [id number]  . CATARACT EXTRACTION W/ INTRAOCULAR LENS  IMPLANT, BILATERAL  1999,2000  . CATARACT EXTRACTION, BILATERAL Bilateral   . COLONOSCOPY W/ BIOPSIES AND POLYPECTOMY  11/2003   Archie Endo 07/08/2010  . EYE SURGERY    . HEMORROIDECTOMY    . LUMBAR LAMINECTOMY  1999  . LUMBAR LAMINECTOMY/DECOMPRESSION MICRODISCECTOMY  09/1999   Ar[facility] Endo 07/08/2010   . POSTERIOR FUSION LUMBAR SPINE  2014    . RIGHT/LEFT HEART CATH AND CORONARY ANGIOGRAPHY N/A 11/15/2016   Procedure: RIGHT/LEFT HEART CATH AND CORONARY ANGIOGRAPHY;  Surgeon: [name], MD;  Location: [place] [facility];  Service: Cardiovascular;  Laterality: N/A;  . SKIN GRAFT SPLIT THICKNESS LEG / FOOT Left 09/2000   Archie Endo 07/08/2010  . TEE WITHOUT CARDIOVERSION N/A 12/07/2016   Procedure: TRANSESOPHAGEAL ECHOCARDIOGRAM (TEE);  Surgeon: [name], MD;  Location: [facility];  Service: Open Heart Surgery;  Laterality: N/A;  . TONSILLECTOMY    . TRANSCATHETER AORTIC VALVE REPLACEMENT, TRANSFEMORAL  12/07/2016   Edwards Sapien 3 THV (size 26 mm, model # 9600TFX, serial # [id number]    Current Medications: Outpatient Medications Prior to Visit  Medication Sig Dispense Refill  . amLODipine (NORVASC) 10 MG tablet Take 1 tablet (10 mg total) by mouth daily. 90 tablet 1  . aspirin EC 81 MG tablet Take 1 tablet (81 mg total) by mouth daily. 90 tablet 3  . Calcium Carb-Cholecalciferol (CALCIUM 1000 + D PO) Take 1 tablet by mouth daily.     . Cholecalciferol (VITAMIN D) 2000 UNITS tablet Take 6,000 Units by mouth daily.     . Cinnamon 500 MG capsule Take 500 mg by mouth 2 (two) times daily.     . clopidogrel (PLAVIX) 75 MG tablet Take 1 tablet (75 mg total) by mouth daily with breakfast. 30 tablet 6  . doxylamine, Sleep, (SLEEP AID) 25 MG tablet Take 50 mg by mouth at bedtime.    Marland Kitchen levothyroxine (SYNTHROID, LEVOTHROID) 137 MCG tablet Take 137 mcg by mouth daily before breakfast.    . Magnesium 500 MG TABS Take 1,000 mg by mouth at bedtime.     . meclizine (ANTIVERT) 25 MG tablet Take 12.5-25 mg by mouth every 6 (six) hours as needed for dizziness.    . Multiple Vitamin (MULTIVITAMIN WITH MINERALS) TABS tablet Take 1 tablet by mouth daily.    . traMADol (ULTRAM) 50 MG tablet Take 1 tablet (50 mg total) by mouth every 8 (eight) hours as needed (pain not relieved by tylenol). 45 tablet 0  . Turmeric 500 MG CAPS Take 500 mg by mouth 2  (two) times daily.    . vitamin B-12 (CYANOCOBALAMIN) 1000 MCG tablet Take 1,000 mcg by mouth daily.    . vitamin C (ASCORBIC ACID) 500 MG tablet Take 500 mg by mouth daily.    . vitamin E 400 UNIT capsule Take 400 Units by mouth 2 (two) times daily.     Marland Kitchen omega-3 acid ethyl esters (LOVAZA) 1 g capsule Take 1 g by mouth 3 (three) times daily.    . pantoprazole (PROTONIX) 40 MG tablet Take 1 tablet (40 mg total) by mouth daily. 30 tablet 6   No facility-administered medications prior to visit.      Allergies:   Lisinopril; Relafen [nabumetone]; Statins; Tape; Welchol [colesevelam]; Zetia [ezetimibe]; Neosporin [neomycin-bacitracin zn-polymyx]; and Zinc  oxide   Social History   Social History  . Marital status: Widowed    Spouse name: irvin  . Number of children: 2  . Years of education: 10th   Occupational History  . retired    Social History Main Topics  . Smoking status: Former Smoker    Packs/day: 2.00    Years: 40.00    Types: Cigarettes    Quit date: 08/02/1989  . Smokeless tobacco: Never Used  . Alcohol use 8.4 oz/week    7 Standard drinks or equivalent, 7 Glasses of wine per week  . Drug use: No  . Sexual activity: No   Other Topics Concern  . None   Social History Narrative   Patient lives at home with her husband   Patient drinks coffee, sodas   Patient has 2 children    Patient is right handed    Patient is retired           Family History:  The patient's family history includes Cancer - Lung in her brother, maternal aunt, and mother; Heart attack in her father.     ROS:   Please see the history of present illness.    ROS All other systems reviewed and are negative.   PHYSICAL EXAM:   VS:  BP 130/60   Pulse 83   Ht 5\' 6"  (1.676 m)   Wt 165 lb 12.8 oz (75.2 kg)   LMP  (LMP Unknown)   SpO2 98%   BMI 26.76 kg/m    GEN: Well nourished, well developed, in no acute distress  HEENT: normal  Neck: no JVD, carotid bruits, or masses Cardiac: RRR; no  murmurs, rubs, or gallops,no edema  Respiratory:  clear to auscultation bilaterally, normal work of breathing GI: soft, nontender, nondistended, + BS MS: no deformity or atrophy  Skin: warm and dry, no rash. Carotid site healing well.  Neuro:  Alert and Oriented x 3, Strength and sensation are intact Psych: euthymic mood, full affect   Wt Readings from Last 3 Encounters:  12/22/16 165 lb 12.8 oz (75.2 kg)  12/13/16 175 lb 0.7 oz (79.4 kg)  12/03/16 169 lb (76.7 kg)      Studies/Labs Reviewed:   EKG:  EKG is ordered today. The ekg ordered today demonstrates NSR HR 81  Recent Labs: 11/10/2016: NT-Pro BNP 379; TSH 2.870 12/03/2016: ALT 10 12/08/2016: Magnesium 1.9 12/09/2016: Hemoglobin 11.5; Platelets 138 12/13/2016: BUN 13; Creatinine, Ser 0.90; Potassium 4.0; Sodium 137   Lipid Panel    Component Value Date/Time   CHOL 226 (H) 05/05/2013 0630   TRIG 149 05/05/2013 0630   HDL 51 05/05/2013 0630   CHOLHDL 4.4 05/05/2013 0630   VLDL 30 05/05/2013 0630   LDLCALC 145 (H) 05/05/2013 0630    Additional studies/ records that were reviewed today include:  TAVR OPERATIVE NOTE   Date of Procedure:12/07/2016  Preoperative Diagnosis:Severe Aortic Stenosis   Postoperative Diagnosis:Same   Procedure: Transcatheter Aortic Valve Replacement - Left carotid approach Edwards Sapien 3 THV (size 36mm, model # 9600TFX, serial # [id number])  Co-Surgeons:Clarence H. Roxy Manns, MD Dominga Ferry, MD  Pre-operative Echo Findings: ? Severesevere aortic stenosis ? Normalleft ventricular systolic function  Post-operative Echo Findings: ? Traceparavalvular leak ? Normalleft ventricular systolic function  _____________  Postoperative echo 12/08/16 Study Conclusions - Left ventricle: The cavity size was normal. Systolic function was normal. The estimated ejection fraction was in the range of 60% to  65%. Wall motion was normal; there were no regional wall  motion abnormalities. Doppler parameters are consistent with abnormal left ventricular relaxation (grade 1 diastolic dysfunction). Doppler parameters are consistent with high ventricular filling pressure. - Aortic valve: A bioprosthesis was present. Valve area (VTI): 2.57 cm^2. Valve area (Vmax): 2.36 cm^2. Valve area (Vmean): 2.56 cm^2. - Mitral valve: Calcified annulus. Mildly thickened leaflets . Impressions: - Limited study post TAVR; normal LV systolic function; mild diastolic dysfunction with elevated LV filling pressure; s/p AVR with normal mean gradient of 9 mmHg and no AI.   ASSESSMENT & PLAN:   Severe AS s/p TAVR: doing excellent. Left SNF today. Carotid surgical site healing well. Cleared to drive and resume all normal activities. Plan for 1 month echo and follow up on 11/8. Continue ASA/plavix.   Right arm pain/swelling: nearly resolved.   HTN: BP well controlled today  Deconditioning: she appears much stronger after her stay at [facility].   Chronic diastolic CHF: appears euvolemic. No changes.   Medication Adjustments/Labs and Tests Ordered: Current medicines are reviewed at length with the patient today.  Concerns regarding medicines are outlined above.  Medication changes, Labs and Tests ordered today are listed in the Patient Instructions below. Patient Instructions  Medication Instructions:  Your physician recommends that you continue on your current medications as directed. Please refer to the Current Medication list given to you today.   Labwork: none  Testing/Procedures: none  Follow-Up: Follow up with echo and appointment as planned on November 8,2018  Any Other Special Instructions Will Be Listed Below (If Applicable).     If you need a refill on your cardiac medications before your next appointment, please call your pharmacy.      Signed, Angelena Form, PA-C    12/22/2016 [facility] [place], [place], [place]  [address] Phone: [phone]; Fax: [phone]

## 2016-12-22 ENCOUNTER — Encounter: Payer: Self-pay | Admitting: Physician Assistant

## 2016-12-22 ENCOUNTER — Ambulatory Visit (INDEPENDENT_AMBULATORY_CARE_PROVIDER_SITE_OTHER): Payer: Medicare Other | Admitting: Physician Assistant

## 2016-12-22 ENCOUNTER — Telehealth: Payer: Self-pay | Admitting: *Deleted

## 2016-12-22 VITALS — BP 130/60 | HR 83 | Ht 66.0 in | Wt 165.8 lb

## 2016-12-22 DIAGNOSIS — Z952 Presence of prosthetic heart valve: Secondary | ICD-10-CM

## 2016-12-22 DIAGNOSIS — R5381 Other malaise: Secondary | ICD-10-CM

## 2016-12-22 DIAGNOSIS — I5032 Chronic diastolic (congestive) heart failure: Secondary | ICD-10-CM | POA: Diagnosis not present

## 2016-12-22 DIAGNOSIS — R0989 Other specified symptoms and signs involving the circulatory and respiratory systems: Secondary | ICD-10-CM

## 2016-12-22 NOTE — Patient Instructions (Addendum)
Medication Instructions:  Your physician recommends that you continue on your current medications as directed. Please refer to the Current Medication list given to you today.   Labwork: none  Testing/Procedures: none  Follow-Up: Follow up with echo and appointment as planned on November 8,2018  Any Other Special Instructions Will Be Listed Below (If Applicable).     If you need a refill on your cardiac medications before your next appointment, please call your pharmacy.

## 2016-12-24 DIAGNOSIS — S81802D Unspecified open wound, left lower leg, subsequent encounter: Secondary | ICD-10-CM | POA: Diagnosis not present

## 2016-12-24 DIAGNOSIS — J449 Chronic obstructive pulmonary disease, unspecified: Secondary | ICD-10-CM | POA: Diagnosis not present

## 2016-12-24 DIAGNOSIS — Z48812 Encounter for surgical aftercare following surgery on the circulatory system: Secondary | ICD-10-CM | POA: Diagnosis not present

## 2016-12-24 DIAGNOSIS — E039 Hypothyroidism, unspecified: Secondary | ICD-10-CM | POA: Diagnosis not present

## 2016-12-24 DIAGNOSIS — K219 Gastro-esophageal reflux disease without esophagitis: Secondary | ICD-10-CM | POA: Diagnosis not present

## 2016-12-24 DIAGNOSIS — M1712 Unilateral primary osteoarthritis, left knee: Secondary | ICD-10-CM | POA: Diagnosis not present

## 2016-12-24 DIAGNOSIS — H9193 Unspecified hearing loss, bilateral: Secondary | ICD-10-CM | POA: Diagnosis not present

## 2016-12-24 DIAGNOSIS — E785 Hyperlipidemia, unspecified: Secondary | ICD-10-CM | POA: Diagnosis not present

## 2016-12-24 DIAGNOSIS — I110 Hypertensive heart disease with heart failure: Secondary | ICD-10-CM | POA: Diagnosis not present

## 2016-12-24 DIAGNOSIS — K589 Irritable bowel syndrome without diarrhea: Secondary | ICD-10-CM | POA: Diagnosis not present

## 2016-12-24 DIAGNOSIS — G4733 Obstructive sleep apnea (adult) (pediatric): Secondary | ICD-10-CM | POA: Diagnosis not present

## 2016-12-24 DIAGNOSIS — M810 Age-related osteoporosis without current pathological fracture: Secondary | ICD-10-CM | POA: Diagnosis not present

## 2016-12-24 DIAGNOSIS — I5032 Chronic diastolic (congestive) heart failure: Secondary | ICD-10-CM | POA: Diagnosis not present

## 2016-12-27 ENCOUNTER — Telehealth (HOSPITAL_COMMUNITY): Payer: Self-pay

## 2016-12-27 NOTE — Telephone Encounter (Signed)
Called and spoke with patient in regards to Cardiac Rehab - patient stated she is not interested in the program. Closed referral. Paperwork in file cabinet

## 2016-12-29 LAB — ECHO TEE
AO mean calculated velocity dopler: 319 cm/s
AV Mean grad: 46 mmHg
AV Peak grad: 76 mmHg
AVPKVEL: 435 cm/s
LVOT area: 3.46 cm2
LVOT diameter: 21 mm
VTI: 104 cm

## 2016-12-30 ENCOUNTER — Ambulatory Visit (HOSPITAL_COMMUNITY): Payer: Medicare Other | Attending: Cardiology

## 2016-12-30 ENCOUNTER — Ambulatory Visit: Payer: Medicare Other | Admitting: Physician Assistant

## 2016-12-30 ENCOUNTER — Encounter: Payer: Self-pay | Admitting: Physician Assistant

## 2016-12-30 ENCOUNTER — Other Ambulatory Visit: Payer: Self-pay

## 2016-12-30 VITALS — BP 150/68 | HR 82 | Ht 66.0 in | Wt 169.8 lb

## 2016-12-30 DIAGNOSIS — I130 Hypertensive heart and chronic kidney disease with heart failure and stage 1 through stage 4 chronic kidney disease, or unspecified chronic kidney disease: Secondary | ICD-10-CM | POA: Diagnosis not present

## 2016-12-30 DIAGNOSIS — I350 Nonrheumatic aortic (valve) stenosis: Secondary | ICD-10-CM | POA: Diagnosis not present

## 2016-12-30 DIAGNOSIS — N189 Chronic kidney disease, unspecified: Secondary | ICD-10-CM | POA: Insufficient documentation

## 2016-12-30 DIAGNOSIS — Z952 Presence of prosthetic heart valve: Secondary | ICD-10-CM

## 2016-12-30 DIAGNOSIS — I509 Heart failure, unspecified: Secondary | ICD-10-CM | POA: Insufficient documentation

## 2016-12-30 DIAGNOSIS — R0989 Other specified symptoms and signs involving the circulatory and respiratory systems: Secondary | ICD-10-CM | POA: Diagnosis not present

## 2016-12-30 DIAGNOSIS — J449 Chronic obstructive pulmonary disease, unspecified: Secondary | ICD-10-CM | POA: Diagnosis not present

## 2016-12-30 DIAGNOSIS — Z953 Presence of xenogenic heart valve: Secondary | ICD-10-CM | POA: Diagnosis not present

## 2016-12-30 DIAGNOSIS — R5381 Other malaise: Secondary | ICD-10-CM | POA: Diagnosis not present

## 2016-12-30 DIAGNOSIS — I5032 Chronic diastolic (congestive) heart failure: Secondary | ICD-10-CM | POA: Diagnosis not present

## 2016-12-30 DIAGNOSIS — E785 Hyperlipidemia, unspecified: Secondary | ICD-10-CM | POA: Diagnosis not present

## 2016-12-30 NOTE — Progress Notes (Signed)
[place]                                       Cardiology Office Note    Date:  12/30/2016   ID:  PHYLLISTINE DOMINGOS, DOB 08-21-1929, MRN [id number]  PCP:  [name], MD  Cardiologist:   Dr. [name] / Dr Burt Knack (TAVR)  CC: 1 month follow up s/p TAVR  History of Present Illness:  Becky Mccall is a 83 y.o. female with a history of  HTN, chronic diastolic CHF, COPD, hypothyroidism, GE reflux, HLD, and severe aortic stenosis s/p TAVR (12/07/16) who presents to clinic for one month follow up.  Ms. Machi reported progressive dyspnea and chest tightness. 2D echo 11/01/16 showed moderate LVH, normal LV function, grade 1 diastolic dysfunction, high ventricular filling pressure, severe aortic stenosis, mild mitral regurgitation, severely dilated left atrium, mild TR. Due to her progression in aortic stenosis, she underwent [facility] 11/15/16 showing normal LVEDP, mild nonobstructive CAD (30% prox RCA, 40% ostial RPDA, 45% D1, 45% dLAD), severe AS with mean gradient 40.63mmHg, AVA 0.76cm2. She was evaluated by Dr. [name] and felt the patient would be at least moderate risk for conventional surgical aortic valve replacement. The patient hassevere atherosclerotic disease and was not felt to haveadequate pelvic vascular access for a transfemoral approach for TAVR.  She ultimately underwent successful TAVR with a 26 mm Edwards Sapien 3 THV placed via open left transcarotid approach. Post operative echo showed good valve placement with no PVL. Her post op course was complicated by AMS (head CT negative) and right arm pain and swelling felt to be related to arterial line placement. She was severely deconditioned and discharged to a SNF. She was discharged on ASA/plavix.   Today she presents to clinic for 1 month follow up. No CP.  SHe continue to has dyspnea on exertion. When she was out raking leaves she had to stop and rest. Also had some mild  dizziness. She has some mild LE edema in her left leg related to a cellulitis that has been treated. No orthopnea or PND. She has some occasional positional dizziness but no syncope. No blood in stool or urine. No palpitations.     Past Medical History:  Diagnosis Date  . CAD in native artery    a.  [facility] 11/15/16 showing normal LVEDP, mild nonobstructive CAD (30% prox RCA, 40% ostial RPDA, 45% D1, 45% dLAD), severe AS with mean gradient 40.62mmHg, AVA 0.76cm2.   . Carotid stenosis   . Cervical spondylolysis   . Chronic lower back pain   . CKD (chronic kidney disease), stage III ([place])   . COPD (chronic obstructive pulmonary disease) ([place])    "mild" (12/09/2016)  . DDD (degenerative disc disease), lumbar   . GERD (gastroesophageal reflux disease)   . Hard of hearing    wears hearing aides both ears  . Hyperlipidemia    can't take the meds so takes Fish Oil  . Hypertension   . Hypothyroidism   . IBS (irritable bowel syndrome)   . Intermittent dysphagia    for pills and solids  . Osteopenia   . S/P TAVR (transcatheter aortic valve replacement) 12/07/2016   26 mm Edwards Sapien 3 transcatheter heart valve placed via open left transcarotid approach   . Severe aortic stenosis     Past Surgical History:  Procedure Laterality  Date  . ABDOMINAL HYSTERECTOMY    . APPENDECTOMY    . BACK SURGERY  2009   "on her lower back; not sure what they did" (12/09/2016)  . BLEPHAROPLASTY Bilateral   . CARDIAC CATHETERIZATION    . CARDIAC VALVE REPLACEMENT  12/07/2016   Edwards Sapien 3 THV (size 26 mm, model # 9600TFX, serial # [id number]  . CATARACT EXTRACTION W/ INTRAOCULAR LENS  IMPLANT, BILATERAL  1999,2000  . CATARACT EXTRACTION, BILATERAL Bilateral   . COLONOSCOPY W/ BIOPSIES AND POLYPECTOMY  11/2003   Archie Endo 07/08/2010  . EYE SURGERY    . HEMORROIDECTOMY    . LUMBAR LAMINECTOMY  1999  . LUMBAR LAMINECTOMY/DECOMPRESSION MICRODISCECTOMY  09/1999   Archie Endo 07/08/2010   . POSTERIOR FUSION  LUMBAR SPINE  2014  . SKIN GRAFT SPLIT THICKNESS LEG / FOOT Left 09/2000   Archie Endo 07/08/2010  . TONSILLECTOMY    . TRANSCATHETER AORTIC VALVE REPLACEMENT, TRANSFEMORAL  12/07/2016   Edwards Sapien 3 THV (size 26 mm, model # 9600TFX, serial # [id number]    Current Medications: Outpatient Medications Prior to Visit  Medication Sig Dispense Refill  . amLODipine (NORVASC) 10 MG tablet Take 1 tablet (10 mg total) by mouth daily. 90 tablet 1  . aspirin EC 81 MG tablet Take 1 tablet (81 mg total) by mouth daily. 90 tablet 3  . Calcium Carb-Cholecalciferol (CALCIUM 1000 + D PO) Take 1 tablet by mouth daily.     . cephALEXin (KEFLEX) 500 MG capsule Take 500 mg as directed by mouth. One to three times a day    . Cholecalciferol (VITAMIN D) 2000 UNITS tablet Take 6,000 Units by mouth daily.     . Cinnamon 500 MG capsule Take 500 mg by mouth 2 (two) times daily.     . clopidogrel (PLAVIX) 75 MG tablet Take 1 tablet (75 mg total) by mouth daily with breakfast. 30 tablet 6  . doxylamine, Sleep, (SLEEP AID) 25 MG tablet Take 50 mg by mouth at bedtime.    Marland Kitchen levothyroxine (SYNTHROID, LEVOTHROID) 137 MCG tablet Take 137 mcg by mouth daily before breakfast.    . Magnesium 500 MG TABS Take 1,000 mg by mouth at bedtime.     . meclizine (ANTIVERT) 25 MG tablet Take 12.5-25 mg by mouth every 6 (six) hours as needed for dizziness.    . Multiple Vitamin (MULTIVITAMIN WITH MINERALS) TABS tablet Take 1 tablet by mouth daily.    . traMADol (ULTRAM) 50 MG tablet Take 1 tablet (50 mg total) by mouth every 8 (eight) hours as needed (pain not relieved by tylenol). 45 tablet 0  . Turmeric 500 MG CAPS Take 500 mg by mouth 2 (two) times daily.    . vitamin B-12 (CYANOCOBALAMIN) 1000 MCG tablet Take 1,000 mcg by mouth daily.    . vitamin C (ASCORBIC ACID) 500 MG tablet Take 500 mg by mouth daily.    . vitamin E 400 UNIT capsule Take 400 Units by mouth 2 (two) times daily.      No facility-administered medications prior to  visit.      Allergies:   Lisinopril; Relafen [nabumetone]; Statins; Tape; Welchol [colesevelam]; Zetia [ezetimibe]; Neosporin [neomycin-bacitracin zn-polymyx]; and Zinc oxide   Social History   Socioeconomic History  . Marital status: Widowed    Spouse name: irvin  . Number of children: 2  . Years of education: 10th  . Highest education level: None  Social Needs  . Financial resource strain: None  . Food insecurity - worry: None  .  Food insecurity - inability: None  . Transportation needs - medical: None  . Transportation needs - non-medical: None  Occupational History  . Occupation: retired  Tobacco Use  . Smoking status: Former Smoker    Packs/day: 2.00    Years: 40.00    Pack years: 80.00    Types: Cigarettes    Last attempt to quit: 08/02/1989    Years since quitting: 27.4  . Smokeless tobacco: Never Used  Substance and Sexual Activity  . Alcohol use: Yes    Alcohol/week: 8.4 oz    Types: 7 Standard drinks or equivalent, 7 Glasses of wine per week  . Drug use: No  . Sexual activity: No    Birth control/protection: Surgical  Other Topics Concern  . None  Social History Narrative   Patient lives at home with her husband   Patient drinks coffee, sodas   Patient has 2 children    Patient is right handed    Patient is retired           Family History:  The patient's family history includes Cancer - Lung in her brother, maternal aunt, and mother; Heart attack in her father.      ROS:   Please see the history of present illness.    ROS All other systems reviewed and are negative.   PHYSICAL EXAM:   VS:  BP (!) 150/68   Pulse 82   Ht 5\' 6"  (1.676 m)   Wt 169 lb 12.8 oz (77 kg)   LMP  (LMP Unknown)   SpO2 97%   BMI 27.41 kg/m    GEN: Well nourished, well developed, in no acute distress  HEENT: normal  Neck: no JVD, carotid bruits, or masses Cardiac: RRR; no murmurs, rubs, or gallops, mild LE edema in left leg around wound Respiratory:  clear to  auscultation bilaterally, normal work of breathing GI: soft, nontender, nondistended, + BS MS: no deformity or atrophy  Skin: warm and dry, no rash Neuro:  Alert and Oriented x 3, Strength and sensation are intact Psych: euthymic mood, full affect   Wt Readings from Last 3 Encounters:  12/30/16 169 lb 12.8 oz (77 kg)  12/22/16 165 lb 12.8 oz (75.2 kg)  12/13/16 175 lb 0.7 oz (79.4 kg)      Studies/Labs Reviewed:   EKG:  EKG is NOT ordered today.    Recent Labs: 11/10/2016: NT-Pro BNP 379; TSH 2.870 12/03/2016: ALT 10 12/08/2016: Magnesium 1.9 12/09/2016: Hemoglobin 11.5; Platelets 138 12/13/2016: BUN 13; Creatinine, Ser 0.90; Potassium 4.0; Sodium 137   Lipid Panel    Component Value Date/Time   CHOL 226 (H) 05/05/2013 0630   TRIG 149 05/05/2013 0630   HDL 51 05/05/2013 0630   CHOLHDL 4.4 05/05/2013 0630   VLDL 30 05/05/2013 0630   LDLCALC 145 (H) 05/05/2013 0630    Additional studies/ records that were reviewed today include:  TAVR OPERATIVE NOTE   Date of Procedure:12/07/2016  Preoperative Diagnosis:Severe Aortic Stenosis  Procedure: Transcatheter Aortic Valve Replacement - Left carotid approach Edwards Sapien 3 THV (size 32mm, model # 9600TFX, serial # [id number])  Co-Surgeons:Clarence H. Roxy Manns, MD Dominga Ferry, MD  Pre-operative Echo Findings: ? Severesevere aortic stenosis ? Normalleft ventricular systolic function  Post-operative Echo Findings: ? Traceparavalvular leak ? Normalleft ventricular systolic function  _____________  Postoperative echo 12/08/16 Study Conclusions - Left ventricle: The cavity size was normal. Systolic function was normal. The estimated ejection fraction was in the range of 60% to 65%. Wall motion  was normal; there were no regional wall motion abnormalities. Doppler parameters are consistent with abnormal left ventricular relaxation  (grade 1 diastolic dysfunction). Doppler parameters are consistent with high ventricular filling pressure. - Aortic valve: A bioprosthesis was present. Valve area (VTI): 2.57 cm^2. Valve area (Vmax): 2.36 cm^2. Valve area (Vmean): 2.56 cm^2. - Mitral valve: Calcified annulus. Mildly thickened leaflets . Impressions: - Limited study post TAVR; normal LV systolic function; mild diastolic dysfunction with elevated LV filling pressure; s/p AVR with normal mean gradient of 9 mmHg and no AI.   _____________  2D ECHO 12/30/16 Study Conclusions - Left ventricle: The cavity size was normal. There was mild   concentric hypertrophy. Systolic function was normal. The   estimated ejection fraction was in the range of 55% to 60%. Wall   motion was normal; there were no regional wall motion   abnormalities. Features are consistent with a pseudonormal left   ventricular filling pattern, with concomitant abnormal relaxation   and increased filling pressure (grade 2 diastolic dysfunction). - Aortic valve: A TAVR stent-valve bioprosthesis was present and   functioning normally. - Mitral valve: Calcified annulus. Valve area by continuity   equation (using LVOT flow): 1.67 cm^2. - Left atrium: The atrium was mildly dilated    ASSESSMENT & PLAN:   Severe AS s/p TAVR: she has NHYA class II symptoms. 2D ECHO today shows EF 55-60% and normally functioning TAVR valve with mean/peak gradient 11 and 22 mm Hg respectively. She has full dentures but we went over SBE prophylaxis. Continue ASA and plavix. She can stop plavix in mid April 2019 after 6 months of therapy.   HTN: BP is elevated today. She thinks it's just related to being at the Dr. Gabriel Carina. She will record her BP over the next week and call us if consistently over 140/90. If she has continued high BPs would resume Losartan which she has been on in the past.   Deconditioning: she is doing much better after her stay at [place]. She is  still having some dyspnea on exertion.   Chronic diastolic CHF: appears euvolemic off diuretic therapy   Medication Adjustments/Labs and Tests Ordered: Current medicines are reviewed at length with the patient today.  Concerns regarding medicines are outlined above.  Medication changes, Labs and Tests ordered today are listed in the Patient Instructions below. Patient Instructions  Medication Instructions:  Your physician has recommended you make the following change in your medication:  STOP Plavix June 07, 2017   Labwork: None ordered  Testing/Procedures: None ordered  Follow-Up: Your physician recommends that you schedule a follow-up appointment in: 3 months with Dr.Turner  Any Other Special Instructions Will Be Listed Below (If Applicable). Your physician has requested that you regularly monitor and record your blood pressure daily  readings at home for 1 week. Please use the same machine at the same time of day to check your readings and record them. Please call the office if your BP is consistently greater than 140/90     If you need a refill on your cardiac medications before your next appointment, please call your pharmacy.      Signed, Angelena Form, PA-C  12/30/2016 8:31 PM    [facility] [address], [place], [place]  [address] Phone: [phone]; Fax: [phone]

## 2016-12-30 NOTE — Patient Instructions (Signed)
Medication Instructions:  Your physician has recommended you make the following change in your medication:  STOP Plavix June 07, 2017   Labwork: None ordered  Testing/Procedures: None ordered  Follow-Up: Your physician recommends that you schedule a follow-up appointment in: 3 months with Dr.Turner  Any Other Special Instructions Will Be Listed Below (If Applicable). Your physician has requested that you regularly monitor and record your blood pressure daily  readings at home for 1 week. Please use the same machine at the same time of day to check your readings and record them. Please call the office if your BP is consistently greater than 140/90     If you need a refill on your cardiac medications before your next appointment, please call your pharmacy.

## 2017-01-03 DIAGNOSIS — L03116 Cellulitis of left lower limb: Secondary | ICD-10-CM | POA: Diagnosis not present

## 2017-01-03 DIAGNOSIS — Z9889 Other specified postprocedural states: Secondary | ICD-10-CM | POA: Diagnosis not present

## 2017-01-03 DIAGNOSIS — J438 Other emphysema: Secondary | ICD-10-CM | POA: Diagnosis not present

## 2017-01-04 ENCOUNTER — Encounter: Payer: Self-pay | Admitting: Thoracic Surgery (Cardiothoracic Vascular Surgery)

## 2017-01-18 DIAGNOSIS — M79605 Pain in left leg: Secondary | ICD-10-CM | POA: Diagnosis not present

## 2017-01-18 DIAGNOSIS — L03116 Cellulitis of left lower limb: Secondary | ICD-10-CM | POA: Diagnosis not present

## 2017-01-18 DIAGNOSIS — R300 Dysuria: Secondary | ICD-10-CM | POA: Diagnosis not present

## 2017-01-18 DIAGNOSIS — R600 Localized edema: Secondary | ICD-10-CM | POA: Diagnosis not present

## 2017-01-18 DIAGNOSIS — L539 Erythematous condition, unspecified: Secondary | ICD-10-CM | POA: Diagnosis not present

## 2017-01-18 DIAGNOSIS — M7989 Other specified soft tissue disorders: Secondary | ICD-10-CM | POA: Diagnosis not present

## 2017-01-18 DIAGNOSIS — I129 Hypertensive chronic kidney disease with stage 1 through stage 4 chronic kidney disease, or unspecified chronic kidney disease: Secondary | ICD-10-CM | POA: Diagnosis not present

## 2017-01-31 ENCOUNTER — Ambulatory Visit (INDEPENDENT_AMBULATORY_CARE_PROVIDER_SITE_OTHER): Payer: Medicare Other | Admitting: Orthopaedic Surgery

## 2017-02-04 DIAGNOSIS — R609 Edema, unspecified: Secondary | ICD-10-CM | POA: Diagnosis not present

## 2017-02-04 DIAGNOSIS — J449 Chronic obstructive pulmonary disease, unspecified: Secondary | ICD-10-CM | POA: Diagnosis not present

## 2017-02-04 DIAGNOSIS — R001 Bradycardia, unspecified: Secondary | ICD-10-CM | POA: Diagnosis not present

## 2017-02-07 ENCOUNTER — Ambulatory Visit (INDEPENDENT_AMBULATORY_CARE_PROVIDER_SITE_OTHER): Payer: Medicare Other | Admitting: Orthopaedic Surgery

## 2017-02-07 ENCOUNTER — Encounter (INDEPENDENT_AMBULATORY_CARE_PROVIDER_SITE_OTHER): Payer: Self-pay | Admitting: Orthopaedic Surgery

## 2017-02-07 DIAGNOSIS — M25562 Pain in left knee: Secondary | ICD-10-CM | POA: Diagnosis not present

## 2017-02-07 DIAGNOSIS — G8929 Other chronic pain: Secondary | ICD-10-CM

## 2017-02-07 MED ORDER — LIDOCAINE HCL 1 % IJ SOLN
3.0000 mL | INTRAMUSCULAR | Status: AC | PRN
  Administered 2017-02-07: 3 mL

## 2017-02-07 MED ORDER — METHYLPREDNISOLONE ACETATE 40 MG/ML IJ SUSP
40.0000 mg | INTRAMUSCULAR | Status: AC | PRN
  Administered 2017-02-07: 40 mg via INTRA_ARTICULAR

## 2017-02-07 NOTE — Progress Notes (Signed)
Office Visit Note   Patient: Becky Mccall           Date of Birth: 06-03-1929           MRN: [id number] Visit Date: 02/07/2017              Requested by: [name], MD [place],  [id number] PCP: [name], MD   Assessment & Plan: Visit Diagnoses:  1. Chronic pain of left knee     Plan: Per her wishes I did provide a steroid injection in her left knee.  I did recommend that she wear her compressive garments daily for the next month.  I will see her back in a month see how she is doing overall. Her thing we can recommend is arthroscopic intervention but I cannot say that that would make all of her swelling go away.  All questions and concerns were answered and addressed.  We will see how she is doing in a month.  Follow-Up Instructions: Return in about 4 weeks (around 03/07/2017).   Orders:  Orders Placed This Encounter  Procedures  . Large Joint Inj   No orders of the defined types were placed in this encounter.     Procedures: Large Joint Inj: L knee on 02/07/2017 10:08 AM Indications: diagnostic evaluation and pain Details: 22 G 1.5 in needle, superolateral approach  Arthrogram: No  Medications: 3 mL lidocaine 1 %; 40 mg methylPREDNISolone acetate 40 MG/ML Outcome: tolerated well, no immediate complications Procedure, treatment alternatives, risks and benefits explained, specific risks discussed. Consent was given by the patient. Immediately prior to procedure a time out was called to verify the correct patient, procedure, equipment, support staff and site/side marked as required. Patient was prepped and draped in the usual sterile fashion.       Clinical Data: No additional findings.   Subjective: Chief Complaint  Patient presents with  . Left Knee - Pain  The patient is here today for her left knee.  She is 83 years old and saw her back in September and found moderate arthritis in that knee.  We placed a steroid  injection and that did help for a while.  She developed some peripheral edema and swelling in that leg.  An ultrasound rule out DVT but did suggest a leaking  Baker's cyst.  She does have compressive hose at home but does not wear them.  She denies any locking catching in her knee but does wish to have a steroid injection in the knee because it does hurt.  HPI  Review of Systems She currently denies any headache, chest pain, shortness of breath, fever, chills, nausea, vomiting.  Objective: Vital Signs: LMP  (LMP Unknown)   Physical Exam She is alert and oriented x3 and in no acute distress Ortho Exam Examination of her left knee does show just some some mild swelling but is only mild.  I cannot palpate a Baker's cyst in the back in her knee and she does not hurt in the popliteal area.  She does have peripheral edema distally. Specialty Comments:  No specialty comments available.  Imaging: No results found.   PMFS History: Patient Active Problem List   Diagnosis Date Noted  . Chronic pain of left knee 02/07/2017  . S/P TAVR (transcatheter aortic valve replacement) 12/07/2016  . Severe aortic stenosis 12/07/2016  . Fibrocystic breast disease   . IBS (irritable bowel syndrome)   . Cervical spondylolysis   . Osteopenia   .  Vitamin D deficiency   . DDD (degenerative disc disease), lumbar   . Squamous cell skin cancer   . Carotid stenosis   . OSA (obstructive sleep apnea)   . Colon polyp   . Hard of hearing   . Hyperlipidemia   . COPD (chronic obstructive pulmonary disease) ([place])   . Hypothyroidism   . GERD (gastroesophageal reflux disease)   . Spinal stenosis in cervical region 02/19/2013   Past Medical History:  Diagnosis Date  . CAD in native artery    a.  [facility] 11/15/16 showing normal LVEDP, mild nonobstructive CAD (30% prox RCA, 40% ostial RPDA, 45% D1, 45% dLAD), severe AS with mean gradient 40.83mmHg, AVA 0.76cm2.   . Carotid stenosis   . Cervical spondylolysis   .  Chronic lower back pain   . CKD (chronic kidney disease), stage III ([place])   . COPD (chronic obstructive pulmonary disease) ([place])    "mild" (12/09/2016)  . DDD (degenerative disc disease), lumbar   . GERD (gastroesophageal reflux disease)   . Hard of hearing    wears hearing aides both ears  . Hyperlipidemia    can't take the meds so takes Fish Oil  . Hypertension   . Hypothyroidism   . IBS (irritable bowel syndrome)   . Intermittent dysphagia    for pills and solids  . Osteopenia   . S/P TAVR (transcatheter aortic valve replacement) 12/07/2016   26 mm Edwards Sapien 3 transcatheter heart valve placed via open left transcarotid approach   . Severe aortic stenosis     Family History  Problem Relation Age of Onset  . Cancer - Lung Mother   . Heart attack Father   . Cancer - Lung Brother   . Cancer - Lung Maternal Aunt     Past Surgical History:  Procedure Laterality Date  . ABDOMINAL HYSTERECTOMY    . APPENDECTOMY    . BACK SURGERY  2009   "on her lower back; not sure what they did" (12/09/2016)  . BLEPHAROPLASTY Bilateral   . CARDIAC CATHETERIZATION    . CARDIAC VALVE REPLACEMENT  12/07/2016   Edwards Sapien 3 THV (size 26 mm, model # 9600TFX, serial # [id number]  . CATARACT EXTRACTION W/ INTRAOCULAR LENS  IMPLANT, BILATERAL  1999,2000  . CATARACT EXTRACTION, BILATERAL Bilateral   . COLONOSCOPY W/ BIOPSIES AND POLYPECTOMY  11/2003   Archie Endo 07/08/2010  . EYE SURGERY    . HEMORROIDECTOMY    . LUMBAR LAMINECTOMY  1999  . LUMBAR LAMINECTOMY/DECOMPRESSION MICRODISCECTOMY  09/1999   Archie Endo 07/08/2010   . POSTERIOR FUSION LUMBAR SPINE  2014  . RIGHT/LEFT HEART CATH AND CORONARY ANGIOGRAPHY N/A 11/15/2016   Procedure: RIGHT/LEFT HEART CATH AND CORONARY ANGIOGRAPHY;  Surgeon: [name], MD;  Location: [facility];  Service: Cardiovascular;  Laterality: N/A;  . SKIN GRAFT SPLIT THICKNESS LEG / FOOT Left 09/2000   Archie Endo 07/08/2010  . TEE WITHOUT CARDIOVERSION N/A  12/07/2016   Procedure: TRANSESOPHAGEAL ECHOCARDIOGRAM (TEE);  Surgeon: [name], MD;  Location: [facility];  Service: Open Heart Surgery;  Laterality: N/A;  . TONSILLECTOMY    . TRANSCATHETER AORTIC VALVE REPLACEMENT, TRANSFEMORAL  12/07/2016   Edwards Sapien 3 THV (size 26 mm, model # 9600TFX, serial # [id number]   Social History   Occupational History  . Occupation: retired  Tobacco Use  . Smoking status: Former Smoker    Packs/day: 2.00    Years: 40.00    Pack years: 80.00    Types: Cigarettes  Last attempt to quit: 08/02/1989    Years since quitting: 27.5  . Smokeless tobacco: Never Used  Substance and Sexual Activity  . Alcohol use: Yes    Alcohol/week: 8.4 oz    Types: 7 Standard drinks or equivalent, 7 Glasses of wine per week  . Drug use: No  . Sexual activity: No    Birth control/protection: Surgical

## 2017-03-02 DIAGNOSIS — L821 Other seborrheic keratosis: Secondary | ICD-10-CM | POA: Diagnosis not present

## 2017-03-02 DIAGNOSIS — L814 Other melanin hyperpigmentation: Secondary | ICD-10-CM | POA: Diagnosis not present

## 2017-03-02 DIAGNOSIS — D1801 Hemangioma of skin and subcutaneous tissue: Secondary | ICD-10-CM | POA: Diagnosis not present

## 2017-03-02 DIAGNOSIS — D225 Melanocytic nevi of trunk: Secondary | ICD-10-CM | POA: Diagnosis not present

## 2017-03-07 ENCOUNTER — Ambulatory Visit (INDEPENDENT_AMBULATORY_CARE_PROVIDER_SITE_OTHER): Payer: Medicare Other | Admitting: Orthopaedic Surgery

## 2017-03-07 ENCOUNTER — Telehealth: Payer: Self-pay | Admitting: Cardiology

## 2017-03-07 ENCOUNTER — Ambulatory Visit (INDEPENDENT_AMBULATORY_CARE_PROVIDER_SITE_OTHER): Payer: Medicare Other

## 2017-03-07 VITALS — HR 78 | Resp 16 | Ht 66.0 in | Wt 166.6 lb

## 2017-03-07 DIAGNOSIS — R001 Bradycardia, unspecified: Secondary | ICD-10-CM | POA: Diagnosis not present

## 2017-03-07 NOTE — Progress Notes (Signed)
1.) Reason for visit: EKG due to bradycardia and dizziness  2.) Name of MD requesting visit: Dr. [name]   3.) H&P: Patient has hx of HTN, CHF, COPD, HLD, and severe aortic stenosis s/p TAVR on 12/07/16 who presents today with dizziness.   4.) ROS related to problem: Patient came in today in NAD with her daughter. She is ambulatory using a walker. Patient states dizziness when she turns her head. EKG completed. Patient in NSR, orthostatics were negative.   5.) Assessment and plan per MD: Obtained orthostatics. Patient orthostatic BP were negative. Patient is alert and oriented X4. EKG reviewed by Dr. [name]. Per Dr. [name] follow up with primary MD. Called Dr. Orest Dikes office, informed staff of Dr. Theodosia Blender recommendation to follow up today and informed her of EKG and orthostatic results. Per staff at office they will contact the patient for a follow up appointment. Patient and daughter in agreement with plan. Patient left with daughter in NAD.

## 2017-03-07 NOTE — Telephone Encounter (Signed)
Returned call to patient. Patient c/o of dizziness when she turns her head and with standing. Patient states these symptoms started yesterday, much worse today. Patient reports bradycardia with HR 40-50s. BP 132/81. Patient denies chest pain, nausea, syncope or palpitations. Per Dr. [name] have patient come in today for EKG at 12:30. Patient/daughter in agreement with plan and thanked me for the call.

## 2017-03-07 NOTE — Progress Notes (Signed)
Agree with note as outlined by Carey Bullocks, RN.  Patient has had dizziness which sounds like vertigo.  Patient's daughter states her mom has had HRs in the 40's.  HR in the 60's on EKG today.  Instructed patient to followup with PCP for dizziness as orthostatic BP was normal.  Please order an event monitor to try to correlate dizziness with arrhythmias

## 2017-03-07 NOTE — Patient Instructions (Addendum)
Medication Instructions:  Your physician recommends that you continue on your current medications as directed. Please refer to the Current Medication list given to you today.  If you need a refill on your cardiac medications, please contact your pharmacy first.  Labwork: None ordered   Testing/Procedures: None ordered   Follow-Up: Dr. [name] recommends that you follow up with your primary MD.   You have a follow up appt scheduled with Dr. [name] on 04/13/17 at 9:20AM.   Any Other Special Instructions Will Be Listed Below (If Applicable).     If you need a refill on your cardiac medications before your next appointment, please call your pharmacy.

## 2017-03-07 NOTE — Telephone Encounter (Signed)
Pt c/o Syncope: STAT if syncope occurred within 30 minutes and pt complains of lightheadedness High Priority if episode of passing out, completely, today or in last 24 hours   1. Did you pass out today? no  2. When is the last time you passed out? no  3. Has this occurred multiple times? no  4. Did you have any symptoms prior to passing out? No  Pt verbalized that she is very dizzy

## 2017-03-08 ENCOUNTER — Telehealth: Payer: Self-pay

## 2017-03-08 DIAGNOSIS — R42 Dizziness and giddiness: Secondary | ICD-10-CM | POA: Diagnosis not present

## 2017-03-08 DIAGNOSIS — R001 Bradycardia, unspecified: Secondary | ICD-10-CM

## 2017-03-08 NOTE — Telephone Encounter (Signed)
-----   Message from [name], MD sent at 03/07/2017  5:54 PM EST -----   ----- Message ----- From: Teressa Senter, RN Sent: 03/07/2017   1:15 PM To: [name], MD

## 2017-03-08 NOTE — Telephone Encounter (Signed)
Informed patient of Dr. Theodosia Blender recommendation for a 30 day event monitor. Patient in agreement with plan and thanked me for the call. Order placed to be scheduled

## 2017-03-16 ENCOUNTER — Ambulatory Visit (INDEPENDENT_AMBULATORY_CARE_PROVIDER_SITE_OTHER): Payer: Medicare Other

## 2017-03-16 DIAGNOSIS — R001 Bradycardia, unspecified: Secondary | ICD-10-CM

## 2017-03-17 ENCOUNTER — Ambulatory Visit (INDEPENDENT_AMBULATORY_CARE_PROVIDER_SITE_OTHER): Payer: Medicare Other | Admitting: Orthopaedic Surgery

## 2017-03-17 ENCOUNTER — Encounter (INDEPENDENT_AMBULATORY_CARE_PROVIDER_SITE_OTHER): Payer: Self-pay | Admitting: Orthopaedic Surgery

## 2017-03-17 DIAGNOSIS — M1711 Unilateral primary osteoarthritis, right knee: Secondary | ICD-10-CM | POA: Insufficient documentation

## 2017-03-17 DIAGNOSIS — M25561 Pain in right knee: Secondary | ICD-10-CM

## 2017-03-17 DIAGNOSIS — M1712 Unilateral primary osteoarthritis, left knee: Secondary | ICD-10-CM | POA: Diagnosis not present

## 2017-03-17 DIAGNOSIS — G8929 Other chronic pain: Secondary | ICD-10-CM | POA: Insufficient documentation

## 2017-03-17 DIAGNOSIS — M25562 Pain in left knee: Secondary | ICD-10-CM | POA: Diagnosis not present

## 2017-03-17 MED ORDER — METHYLPREDNISOLONE ACETATE 40 MG/ML IJ SUSP
40.0000 mg | INTRAMUSCULAR | Status: AC | PRN
  Administered 2017-03-17: 40 mg via INTRA_ARTICULAR

## 2017-03-17 MED ORDER — LIDOCAINE HCL 1 % IJ SOLN
3.0000 mL | INTRAMUSCULAR | Status: AC | PRN
  Administered 2017-03-17: 3 mL

## 2017-03-17 NOTE — Progress Notes (Signed)
The patient is well-known to [place].  She is 84 year old female with bilateral knee osteoarthritis.  Her left knee hurts of the worse than it does have valgus malalignment on exam today.  We were hoping a steroid ejection would help her back in December which we just did a month ago but has not helped much.  The used to help in the past but not now.  We talked with him about possibility of arthroscopic surgery on that knee but on review of x-rays and clinical exam in all planes would help her at all and no other surgical option would be a knee replacement.  Her right knee is been hurting as well now.  She like to try a steroid injection in her right knee today and then I do feel this is appropriate candidate for trying hyaluronic acid in both knees.  We will need to order this to her insurance company and get it authorized.  She is agreeable to this as well.  She tolerated steroid injection right knee well.  All questions were encouraged and answered.  Hopefully we see her back in 2 weeks we will be able to place a hyaluronic acid injection in both right and left knees to treat her moderate arthritic pain.

## 2017-03-17 NOTE — Progress Notes (Signed)
   Procedure Note  Patient: Becky Mccall             Date of Birth: 12-31-1929           MRN: [id number]             Visit Date: 03/17/2017  Procedures: Visit Diagnoses: Chronic pain of left knee  Chronic pain of right knee  Unilateral primary osteoarthritis, left knee  Unilateral primary osteoarthritis, right knee  Large Joint Inj: R knee on 03/17/2017 3:23 PM Indications: diagnostic evaluation and pain Details: 22 G 1.5 in needle, superolateral approach  Arthrogram: No  Medications: 3 mL lidocaine 1 %; 40 mg methylPREDNISolone acetate 40 MG/ML Outcome: tolerated well, no immediate complications Procedure, treatment alternatives, risks and benefits explained, specific risks discussed. Consent was given by the patient. Immediately prior to procedure a time out was called to verify the correct patient, procedure, equipment, support staff and site/side marked as required. Patient was prepped and draped in the usual sterile fashion.

## 2017-03-18 ENCOUNTER — Other Ambulatory Visit (INDEPENDENT_AMBULATORY_CARE_PROVIDER_SITE_OTHER): Payer: Self-pay | Admitting: Radiology

## 2017-03-31 ENCOUNTER — Ambulatory Visit (INDEPENDENT_AMBULATORY_CARE_PROVIDER_SITE_OTHER): Payer: Medicare Other | Admitting: Orthopaedic Surgery

## 2017-03-31 ENCOUNTER — Encounter (INDEPENDENT_AMBULATORY_CARE_PROVIDER_SITE_OTHER): Payer: Self-pay | Admitting: Orthopaedic Surgery

## 2017-03-31 DIAGNOSIS — M1711 Unilateral primary osteoarthritis, right knee: Secondary | ICD-10-CM | POA: Diagnosis not present

## 2017-03-31 DIAGNOSIS — M1712 Unilateral primary osteoarthritis, left knee: Secondary | ICD-10-CM

## 2017-03-31 NOTE — Progress Notes (Signed)
   Procedure Note  Patient: Becky Mccall             Date of Birth: Sep 18, 1929           MRN: [id number]             Visit Date: 03/31/2017  Procedures: Visit Diagnoses: Unilateral primary osteoarthritis, right knee  Unilateral primary osteoarthritis, left knee  Large Joint Inj: bilateral knee on 03/31/2017 2:57 PM Indications: pain and diagnostic evaluation Details: 22 G 1.5 in needle, superolateral approach  Arthrogram: No  Outcome: tolerated well, no immediate complications Procedure, treatment alternatives, risks and benefits explained, specific risks discussed. Consent was given by the patient. Immediately prior to procedure a time out was called to verify the correct patient, procedure, equipment, support staff and site/side marked as required. Patient was prepped and draped in the usual sterile fashion.     The patient is here today for scheduled hyaluronic acid injections in both knees to treat arthritis pain.  She is 84 years old and active.  This is been prearranged for bilateral knee Synvisc 1 injections.  Her daughter understand the rationale behind placing these injections in her knees.  All questions and concerns were answered and addressed.  She tolerated injections well.  She knows that she can get steroids again in her knees if needed in 3-4 months.  She will otherwise follow-up as needed.

## 2017-04-01 DIAGNOSIS — N183 Chronic kidney disease, stage 3 (moderate): Secondary | ICD-10-CM | POA: Diagnosis not present

## 2017-04-01 DIAGNOSIS — I129 Hypertensive chronic kidney disease with stage 1 through stage 4 chronic kidney disease, or unspecified chronic kidney disease: Secondary | ICD-10-CM | POA: Diagnosis not present

## 2017-04-01 DIAGNOSIS — E785 Hyperlipidemia, unspecified: Secondary | ICD-10-CM | POA: Diagnosis not present

## 2017-04-01 DIAGNOSIS — Z0000 Encounter for general adult medical examination without abnormal findings: Secondary | ICD-10-CM | POA: Diagnosis not present

## 2017-04-11 NOTE — Progress Notes (Deleted)
Cardiology Office Note:    Date:  04/11/2017   ID:  Becky Mccall, DOB April 08, 1929, MRN [id number]  PCP:  [name], MD  Cardiologist:  No primary care provider on file.    Referring MD: [name], MD   No chief complaint on file.   History of Present Illness:    Becky Mccall is a 84 y.o. female with a hx of ***  Past Medical History:  Diagnosis Date  . CAD in native artery    a.  [facility] 11/15/16 showing normal LVEDP, mild nonobstructive CAD (30% prox RCA, 40% ostial RPDA, 45% D1, 45% dLAD), severe AS with mean gradient 40.74mmHg, AVA 0.76cm2.   . Carotid stenosis   . Cervical spondylolysis   . Chronic lower back pain   . CKD (chronic kidney disease), stage III ([place])   . COPD (chronic obstructive pulmonary disease) ([place])    "mild" (12/09/2016)  . DDD (degenerative disc disease), lumbar   . GERD (gastroesophageal reflux disease)   . Hard of hearing    wears hearing aides both ears  . Hyperlipidemia    can't take the meds so takes Fish Oil  . Hypertension   . Hypothyroidism   . IBS (irritable bowel syndrome)   . Intermittent dysphagia    for pills and solids  . Osteopenia   . S/P TAVR (transcatheter aortic valve replacement) 12/07/2016   26 mm Edwards Sapien 3 transcatheter heart valve placed via open left transcarotid approach   . Severe aortic stenosis     Past Surgical History:  Procedure Laterality Date  . ABDOMINAL HYSTERECTOMY    . APPENDECTOMY    . BACK SURGERY  2009   "on her lower back; not sure what they did" (12/09/2016)  . BLEPHAROPLASTY Bilateral   . CARDIAC CATHETERIZATION    . CARDIAC VALVE REPLACEMENT  12/07/2016   Edwards Sapien 3 THV (size 26 mm, model # 9600TFX, serial # [id number]  . CATARACT EXTRACTION W/ INTRAOCULAR LENS  IMPLANT, BILATERAL  1999,2000  . CATARACT EXTRACTION, BILATERAL Bilateral   . COLONOSCOPY W/ BIOPSIES AND POLYPECTOMY  11/2003   Archie Endo 07/08/2010  . EYE SURGERY    . HEMORROIDECTOMY    . LUMBAR LAMINECTOMY   1999  . LUMBAR LAMINECTOMY/DECOMPRESSION MICRODISCECTOMY  09/1999   Archie Endo 07/08/2010   . POSTERIOR FUSION LUMBAR SPINE  2014  . RIGHT/LEFT HEART CATH AND CORONARY ANGIOGRAPHY N/A 11/15/2016   Procedure: RIGHT/LEFT HEART CATH AND CORONARY ANGIOGRAPHY;  Surgeon: [name], MD;  Location: [place] [facility];  Service: Cardiovascular;  Laterality: N/A;  . SKIN GRAFT SPLIT THICKNESS LEG / FOOT Left 09/2000   Archie Endo 07/08/2010  . TEE WITHOUT CARDIOVERSION N/A 12/07/2016   Procedure: TRANSESOPHAGEAL ECHOCARDIOGRAM (TEE);  Surgeon: [name], MD;  Location: [place];  Service: Open Heart Surgery;  Laterality: N/A;  . TONSILLECTOMY    . TRANSCATHETER AORTIC VALVE REPLACEMENT, TRANSFEMORAL  12/07/2016   Edwards Sapien 3 THV (size 26 mm, model # 9600TFX, serial # [id number]    Current Medications: No outpatient medications have been marked as taking for the 04/13/17 encounter (Appointment) with [name], MD.     Allergies:   Lisinopril; Relafen [nabumetone]; Statins; Tape; Welchol [colesevelam]; Zetia [ezetimibe]; Neosporin [neomycin-bacitracin zn-polymyx]; and Zinc oxide   Social History   Socioeconomic History  . Marital status: Widowed    Spouse name: irvin  . Number of children: 2  . Years of education: 10th  . Highest education level: Not on file  Social  Needs  . Financial resource strain: Not on file  . Food insecurity - worry: Not on file  . Food insecurity - inability: Not on file  . Transportation needs - medical: Not on file  . Transportation needs - non-medical: Not on file  Occupational History  . Occupation: retired  Tobacco Use  . Smoking status: Former Smoker    Packs/day: 2.00    Years: 40.00    Pack years: 80.00    Types: Cigarettes    Last attempt to quit: 08/02/1989    Years since quitting: 27.7  . Smokeless tobacco: Never Used  Substance and Sexual Activity  . Alcohol use: Yes    Alcohol/week: 8.4 oz    Types: 7 Standard drinks or equivalent, 7  Glasses of wine per week  . Drug use: No  . Sexual activity: No    Birth control/protection: Surgical  Other Topics Concern  . Not on file  Social History Narrative   Patient lives at home with her husband   Patient drinks coffee, sodas   Patient has 2 children    Patient is right handed    Patient is retired           Family History: The patient's ***family history includes Cancer - Lung in her brother, maternal aunt, and mother; Heart attack in her father.  ROS:   Please see the history of present illness.    ROS  All other systems reviewed and negative.   EKGs/Labs/Other Studies Reviewed:    The following studies were reviewed today: ***  EKG:  EKG is *** ordered today.  The ekg ordered today demonstrates ***  Recent Labs: 11/10/2016: NT-Pro BNP 379; TSH 2.870 12/03/2016: ALT 10 12/08/2016: Magnesium 1.9 12/09/2016: Hemoglobin 11.5; Platelets 138 12/13/2016: BUN 13; Creatinine, Ser 0.90; Potassium 4.0; Sodium 137   Recent Lipid Panel    Component Value Date/Time   CHOL 226 (H) 05/05/2013 0630   TRIG 149 05/05/2013 0630   HDL 51 05/05/2013 0630   CHOLHDL 4.4 05/05/2013 0630   VLDL 30 05/05/2013 0630   LDLCALC 145 (H) 05/05/2013 0630    Physical Exam:    VS:  LMP  (LMP Unknown)     Wt Readings from Last 3 Encounters:  03/07/17 166 lb 9.6 oz (75.6 kg)  12/30/16 169 lb 12.8 oz (77 kg)  12/22/16 165 lb 12.8 oz (75.2 kg)     GEN: *** Well nourished, well developed in no acute distress HEENT: Normal NECK: No JVD; No carotid bruits LYMPHATICS: No lymphadenopathy CARDIAC: ***RRR, no murmurs, rubs, gallops RESPIRATORY:  Clear to auscultation without rales, wheezing or rhonchi  ABDOMEN: Soft, non-tender, non-distended MUSCULOSKELETAL:  No edema; No deformity  SKIN: Warm and dry NEUROLOGIC:  Alert and oriented x 3 PSYCHIATRIC:  Normal affect   ASSESSMENT:    No diagnosis found. PLAN:    In order of problems listed above:  ***   Medication  Adjustments/Labs and Tests Ordered: Current medicines are reviewed at length with the patient today.  Concerns regarding medicines are outlined above.  No orders of the defined types were placed in this encounter.  No orders of the defined types were placed in this encounter.   Signed, Fransico Him, MD  04/11/2017 9:30 PM    [place]

## 2017-04-13 ENCOUNTER — Encounter: Payer: Self-pay | Admitting: Physician Assistant

## 2017-04-13 ENCOUNTER — Ambulatory Visit: Payer: Medicare Other | Admitting: Cardiology

## 2017-04-13 NOTE — Progress Notes (Signed)
Cardiology Office Note    Date:  04/14/2017  ID:  Becky Mccall, Becky Mccall 12/21/1929, MRN [id number] PCP:  [name], MD  Cardiologist:  Dr. [name] (also structural heart team)   Chief Complaint: f/u dizziness  History of Present Illness:  Becky Mccall is a 83 y.o. female with history of recently progressive severe AS s/p TAVR 11/2016, mild CAD by cath 10/2016, COPD, carotid artery disease (1-39% 2018), hard of hearing, hyperlipidemia, TIA, HTN, hypothyroidism, GERD, probable CKD stage II, anemia 04/2013 who presents for 3 month follow-up.   Earlier this year while undergoing workup for knee surgery she was found to have echo 10/2016 showing severe AS.  Due to her progression in aortic stenosis, she underwent [facility] 11/15/16 showing normal LVEDP, mild nonobstructive CAD (30% prox RCA, 40% ostial RPDA, 45% D1, 45% dLAD), severe AS with mean gradient 40.61mmHg, AVA 0.76cm2. She underwent TAVR 11/2016. Last seen 12/30/16 by Nell Range with DOE but otherwise felt to be doing well. The plan was to stop Plavix 05/2017 after 6 months of therapy. She was felt to have deconditioning. She also has chronic vertiginous dizziness which did not change with TAVR. This goes back for many years. She has seen her primary care provider for this as well and had bloodwork which patient and daughter state was recently OK. She was seen in our office for this 02/2017 with a nurse visit. Orthostatics were negative. Daughter had reported HR in the 40s at home but this was 73 on EKG. Event monitor was ordered which is still in process but preliminary download demonstrates NSR with nocturnal HR in the upper 50s but no significant bradycardia or tachycardia. The patient states the dizziness is worse in the morning, and worse with specific movements of her neck or looking a certain way. She has chronic DOE which is unchanged. Pulse ox is normal. She does have history of dizziness requiring hospital admission in 2015, which  was felt due to vertigo. She was originally placed on Plavix for possible TIA but neurology later changed this to aspirin only to allow for the patient to take naproxen PRN. MRA in 2015 showing small cerebral aneurysms ("2 mm aneurysm projecting laterally from the proximal carotid siphon on the left. Small infundibulae versus aneurysms (2-3 mm) at both superior hypophyseal artery or posterior communicating regions.") Neurology notes in 2015 indicate these were felt to be incidental findings and PRN follow-up only was advised.  Past Medical History:  Diagnosis Date  . CAD in native artery    a.  [facility] 11/15/16 showing normal LVEDP, mild nonobstructive CAD (30% prox RCA, 40% ostial RPDA, 45% D1, 45% dLAD), severe AS with mean gradient 40.33mmHg, AVA 0.76cm2.   . Carotid stenosis   . Cervical spondylolysis   . Chronic lower back pain   . CKD (chronic kidney disease), stage II   . COPD (chronic obstructive pulmonary disease) ([place])    "mild" (12/09/2016)  . DDD (degenerative disc disease), lumbar   . GERD (gastroesophageal reflux disease)   . Hard of hearing    wears hearing aides both ears  . Hyperlipidemia    can't take the meds so takes Fish Oil  . Hypertension   . Hypothyroidism   . IBS (irritable bowel syndrome)   . Intermittent dysphagia    for pills and solids  . Osteopenia   . S/P TAVR (transcatheter aortic valve replacement) 12/07/2016   26 mm Edwards Sapien 3 transcatheter heart valve placed via open left transcarotid approach   .  Severe aortic stenosis    a. s/p TAVR 11/2016.    Past Surgical History:  Procedure Laterality Date  . ABDOMINAL HYSTERECTOMY    . APPENDECTOMY    . BACK SURGERY  2009   "on her lower back; not sure what they did" (12/09/2016)  . BLEPHAROPLASTY Bilateral   . CARDIAC CATHETERIZATION    . CARDIAC VALVE REPLACEMENT  12/07/2016   Edwards Sapien 3 THV (size 26 mm, model # 9600TFX, serial # [id number]  . CATARACT EXTRACTION W/ INTRAOCULAR LENS  IMPLANT,  BILATERAL  1999,2000  . CATARACT EXTRACTION, BILATERAL Bilateral   . COLONOSCOPY W/ BIOPSIES AND POLYPECTOMY  11/2003   Archie Endo 07/08/2010  . EYE SURGERY    . HEMORROIDECTOMY    . LUMBAR LAMINECTOMY  1999  . LUMBAR LAMINECTOMY/DECOMPRESSION MICRODISCECTOMY  09/1999   Archie Endo 07/08/2010   . POSTERIOR FUSION LUMBAR SPINE  2014  . RIGHT/LEFT HEART CATH AND CORONARY ANGIOGRAPHY N/A 11/15/2016   Procedure: RIGHT/LEFT HEART CATH AND CORONARY ANGIOGRAPHY;  Surgeon: [name], MD;  Location: [place] [facility];  Service: Cardiovascular;  Laterality: N/A;  . SKIN GRAFT SPLIT THICKNESS LEG / FOOT Left 09/2000   Archie Endo 07/08/2010  . TEE WITHOUT CARDIOVERSION N/A 12/07/2016   Procedure: TRANSESOPHAGEAL ECHOCARDIOGRAM (TEE);  Surgeon: [name], MD;  Location: [place];  Service: Open Heart Surgery;  Laterality: N/A;  . TONSILLECTOMY    . TRANSCATHETER AORTIC VALVE REPLACEMENT, TRANSFEMORAL  12/07/2016   Edwards Sapien 3 THV (size 26 mm, model # 9600TFX, serial # [id number]    Current Medications: Current Meds  Medication Sig  . amLODipine (NORVASC) 2.5 MG tablet Take 2.5 mg by mouth daily.  Marland Kitchen aspirin EC 81 MG tablet Take 1 tablet (81 mg total) by mouth daily.  . Calcium Carb-Cholecalciferol (CALCIUM 1000 + D PO) Take 1 tablet by mouth daily.   . Cholecalciferol (VITAMIN D) 2000 UNITS tablet Take 6,000 Units by mouth daily.   . Cinnamon 500 MG capsule Take 500 mg by mouth 2 (two) times daily.   . clopidogrel (PLAVIX) 75 MG tablet Take 1 tablet (75 mg total) by mouth daily with breakfast.  . levothyroxine (SYNTHROID, LEVOTHROID) 137 MCG tablet Take 137 mcg by mouth daily before breakfast.  . Magnesium 500 MG TABS Take 1,000 mg by mouth at bedtime.   . meclizine (ANTIVERT) 25 MG tablet Take 12.5-25 mg by mouth every 6 (six) hours as needed for dizziness.  . Melatonin 3 MG CAPS Take 2 capsules by mouth at bedtime.  . Multiple Vitamin (MULTIVITAMIN WITH MINERALS) TABS tablet Take 1 tablet by mouth  daily.  . traMADol (ULTRAM) 50 MG tablet Take 1 tablet (50 mg total) by mouth every 8 (eight) hours as needed (pain not relieved by tylenol).  . Turmeric 500 MG CAPS Take 500 mg by mouth 2 (two) times daily.  . vitamin B-12 (CYANOCOBALAMIN) 1000 MCG tablet Take 1,000 mcg by mouth daily.  . vitamin C (ASCORBIC ACID) 500 MG tablet Take 500 mg by mouth daily.  . vitamin E 400 UNIT capsule Take 400 Units by mouth 2 (two) times daily.      Allergies:   Lisinopril; Relafen [nabumetone]; Statins; Tape; Welchol [colesevelam]; Zetia [ezetimibe]; Neosporin [neomycin-bacitracin zn-polymyx]; and Zinc oxide   Social History   Socioeconomic History  . Marital status: Widowed    Spouse name: irvin  . Number of children: 2  . Years of education: 10th  . Highest education level: None  Social Needs  . Financial resource strain:  None  . Food insecurity - worry: None  . Food insecurity - inability: None  . Transportation needs - medical: None  . Transportation needs - non-medical: None  Occupational History  . Occupation: retired  Tobacco Use  . Smoking status: Former Smoker    Packs/day: 2.00    Years: 40.00    Pack years: 80.00    Types: Cigarettes    Last attempt to quit: 08/02/1989    Years since quitting: 27.7  . Smokeless tobacco: Never Used  Substance and Sexual Activity  . Alcohol use: Yes    Alcohol/week: 8.4 oz    Types: 7 Standard drinks or equivalent, 7 Glasses of wine per week  . Drug use: No  . Sexual activity: No    Birth control/protection: Surgical  Other Topics Concern  . None  Social History Narrative   Patient lives at home with her husband   Patient drinks coffee, sodas   Patient has 2 children    Patient is right handed    Patient is retired           Family History:  Family History  Problem Relation Age of Onset  . Cancer - Lung Mother   . Heart attack Father   . Cancer - Lung Brother   . Cancer - Lung Maternal Aunt      ROS:   Please see the history  of present illness.  All other systems are reviewed and otherwise negative.    PHYSICAL EXAM:   VS:  BP 134/74   Pulse 73   Ht 5\' 6"  (1.676 m)   Wt 168 lb (76.2 kg)   LMP  (LMP Unknown)   SpO2 97%   BMI 27.12 kg/m   BMI: Body mass index is 27.12 kg/m. GEN: Well nourished, well developed WF, in no acute distress  HEENT: normocephalic, atraumatic Neck: no JVD, carotid bruits, or masses Cardiac: RRR; minimal SEM over precordium, no rubs or gallops, no edema  Respiratory:  clear to auscultation bilaterally, normal work of breathing GI: soft, nontender, nondistended, + BS MS: no deformity or atrophy  Skin: warm and dry, no rash Neuro:  Alert and Oriented x 3, Strength and sensation are intact, follows commands Psych: euthymic mood, full affect  Wt Readings from Last 3 Encounters:  04/14/17 168 lb (76.2 kg)  03/07/17 166 lb 9.6 oz (75.6 kg)  12/30/16 169 lb 12.8 oz (77 kg)      Studies/Labs Reviewed:   EKG:  EKG was not ordered today.  Recent Labs: 11/10/2016: NT-Pro BNP 379; TSH 2.870 12/03/2016: ALT 10 12/08/2016: Magnesium 1.9 12/09/2016: Hemoglobin 11.5; Platelets 138 12/13/2016: BUN 13; Creatinine, Ser 0.90; Potassium 4.0; Sodium 137   Lipid Panel    Component Value Date/Time   CHOL 226 (H) 05/05/2013 0630   TRIG 149 05/05/2013 0630   HDL 51 05/05/2013 0630   CHOLHDL 4.4 05/05/2013 0630   VLDL 30 05/05/2013 0630   LDLCALC 145 (H) 05/05/2013 0630    Additional studies/ records that were reviewed today include: Summarized above.    ASSESSMENT & PLAN:   1. Dizziness - chronic for at least 4-5 years' time. I do not suspect this to be cardiac in etiology. Carotid duplex 11/2016 was negative for hemodynamically significant stenosis. Preliminary event monitor tracings appear benign. She has not been able to correlate any low blood pressures with the dizziness. It is worse with head position changes. I think she may benefit from revisiting neurology. The family is  already established  with neurosurgeon Dr. Saintclair Halsted and had previous plans to follow up with him anyway. I am not sure if this would be within his scope but I told them they can call and speak to Dr. Windy Carina nurse to see whether this is something he could address, versus perhaps getting back into see neurology (saw Dr. [name] in 2015). 2. Severe AS s/p TAVR - f/u echo 12/2016 was stable. The patient already has a stop date for her Plavix in 05/2017. She appears clinically stable. 3. HTN - controlled on present regimen; do not believe this is related to chronic vertiginous dizziness. 4. Mild CAD - continue aspirin beyond discontinuation of Plavix as tolerated. No angina. She has chronic DOE but fortunately did not have any obstructive lesions by cath. She appears euvolemic.  Disposition: F/u with Dr. [name] in 6 months.   Medication Adjustments/Labs and Tests Ordered: Current medicines are reviewed at length with the patient today.  Concerns regarding medicines are outlined above. Medication changes, Labs and Tests ordered today are summarized above and listed in the Patient Instructions accessible in Encounters.   Signed, Charlie Pitter, PA-C  04/14/2017 4:08 PM    [facility] [place], [place], [place]  [address] Phone: [phone]; Fax: [phone]

## 2017-04-14 ENCOUNTER — Encounter: Payer: Self-pay | Admitting: Physician Assistant

## 2017-04-14 ENCOUNTER — Ambulatory Visit: Payer: Medicare Other | Admitting: Physician Assistant

## 2017-04-14 VITALS — BP 134/74 | HR 73 | Ht 66.0 in | Wt 168.0 lb

## 2017-04-14 DIAGNOSIS — I10 Essential (primary) hypertension: Secondary | ICD-10-CM | POA: Diagnosis not present

## 2017-04-14 DIAGNOSIS — I2510 Atherosclerotic heart disease of native coronary artery without angina pectoris: Secondary | ICD-10-CM | POA: Diagnosis not present

## 2017-04-14 DIAGNOSIS — Z952 Presence of prosthetic heart valve: Secondary | ICD-10-CM

## 2017-04-14 DIAGNOSIS — R42 Dizziness and giddiness: Secondary | ICD-10-CM

## 2017-04-14 NOTE — Patient Instructions (Signed)
Medication Instructions: Your physician recommends that you continue on your current medications as directed. Please refer to the Current Medication list given to you today.   Labwork: None ordered  Testing/Procedures: None ordered  Follow-Up: Your physician wants you to follow-up in: [place].  (CALL IN June AND MAKE THE APPT FOR AUGUST)   You will receive a reminder letter in the mail two months in advance. If you don't receive a letter, please call our office to schedule the follow-up appointment.   Any Other Special Instructions Will Be Listed Below (If Applicable).     If you need a refill on your cardiac medications before your next appointment, please call your pharmacy.

## 2017-04-15 ENCOUNTER — Telehealth: Payer: Self-pay | Admitting: *Deleted

## 2017-04-15 NOTE — Telephone Encounter (Signed)
Recent labs reviewed from PCP in setting of ongoing vertigo. Hgb and potassium and thyroid stable. No change in plan. Dayna Dunn PA-C

## 2017-04-15 NOTE — Telephone Encounter (Signed)
Patient's [facility] report shows the following labs:  Cholesterol, Total     266      04/01/17 HDL                          64        04/01/17 LDL                          171      04/01/17 Triglycerides            155      04/01/17 A1C                          5.2      12/03/16 Hgb                          13.1     04/01/17 Cr                             1.01    04/01/17 K+                            4.4      04/01/17 ALT                           6.00   04/01/17 TSH                          2.8     04/01/17 INR                           1.17   12/07/16 PLATELETS            138    10/18 18

## 2017-04-19 ENCOUNTER — Encounter: Payer: Self-pay | Admitting: Physical Therapy

## 2017-04-19 ENCOUNTER — Other Ambulatory Visit: Payer: Self-pay

## 2017-04-19 ENCOUNTER — Ambulatory Visit: Payer: Medicare Other | Attending: Family Medicine | Admitting: Physical Therapy

## 2017-04-19 DIAGNOSIS — R2681 Unsteadiness on feet: Secondary | ICD-10-CM | POA: Insufficient documentation

## 2017-04-19 DIAGNOSIS — M542 Cervicalgia: Secondary | ICD-10-CM | POA: Diagnosis not present

## 2017-04-19 DIAGNOSIS — R29898 Other symptoms and signs involving the musculoskeletal system: Secondary | ICD-10-CM

## 2017-04-19 DIAGNOSIS — R2689 Other abnormalities of gait and mobility: Secondary | ICD-10-CM | POA: Insufficient documentation

## 2017-04-19 DIAGNOSIS — R42 Dizziness and giddiness: Secondary | ICD-10-CM

## 2017-04-19 DIAGNOSIS — I10 Essential (primary) hypertension: Secondary | ICD-10-CM | POA: Diagnosis not present

## 2017-04-19 DIAGNOSIS — Z6826 Body mass index (BMI) 26.0-26.9, adult: Secondary | ICD-10-CM | POA: Diagnosis not present

## 2017-04-19 DIAGNOSIS — R293 Abnormal posture: Secondary | ICD-10-CM | POA: Diagnosis not present

## 2017-04-19 NOTE — Patient Instructions (Signed)
Instructed patient in gentle AROM for cervical rotation, lateral flexion, flexion and extension. Educated to go to point of mild discomfort or stretch and gently turn to other side. Daughter present for education.

## 2017-04-20 ENCOUNTER — Ambulatory Visit: Payer: Medicare Other | Admitting: Physical Therapy

## 2017-04-20 NOTE — Therapy (Signed)
[facility] [address] [place], [place], [address] Phone: [phone]   Fax:  [phone]  Physical Therapy Evaluation  Patient Details  Name: Becky Mccall MRN: [id number] Date of Birth: [birth year] Referring Provider: [name]   Encounter Date: 04/19/2017  PT End of Session - 04/19/17 1617    Visit Number  1    Number of Visits  9    Date for PT Re-Evaluation  06/18/17    Authorization Type  [employer] MCR    Authorization Time Period  04/19/17 to 06/18/17    PT Start Time  1448    PT Stop Time  1545    PT Time Calculation (min)  57 min    Activity Tolerance  Patient tolerated treatment well    Behavior During Therapy  [facility] for tasks assessed/performed       Past Medical History:  Diagnosis Date  . Brain aneurysm   . CAD in native artery    a.  [facility] 11/15/16 showing normal LVEDP, mild nonobstructive CAD (30% prox RCA, 40% ostial RPDA, 45% D1, 45% dLAD), severe AS with mean gradient 40.46mmHg, AVA 0.76cm2.   . Carotid stenosis    a. 1-39% by duplex 2018.  Marland Kitchen Cervical spondylolysis   . Chronic lower back pain   . CKD (chronic kidney disease), stage II   . COPD (chronic obstructive pulmonary disease) ([place])    "mild" (12/09/2016)  . DDD (degenerative disc disease), lumbar   . GERD (gastroesophageal reflux disease)   . Hard of hearing    wears hearing aides both ears  . Hyperlipidemia    can't take the meds so takes Fish Oil  . Hypertension   . Hypothyroidism   . IBS (irritable bowel syndrome)   . Intermittent dysphagia    for pills and solids  . Osteopenia   . S/P TAVR (transcatheter aortic valve replacement) 12/07/2016   26 mm Edwards Sapien 3 transcatheter heart valve placed via open left transcarotid approach   . Severe aortic stenosis    a. s/p TAVR 11/2016.  Marland Kitchen Vertigo     Past Surgical History:  Procedure Laterality Date  . ABDOMINAL HYSTERECTOMY    . APPENDECTOMY    . BACK SURGERY  2009   "on her lower back; not sure what they did" (12/09/2016)  . BLEPHAROPLASTY Bilateral   . CARDIAC CATHETERIZATION    . CARDIAC VALVE REPLACEMENT  12/07/2016   Edwards Sapien 3 THV (size 26 mm, model # 9600TFX, serial # [id number]  . CATARACT EXTRACTION W/ INTRAOCULAR LENS  IMPLANT, BILATERAL  1999,2000  . CATARACT EXTRACTION, BILATERAL Bilateral   . COLONOSCOPY W/ BIOPSIES AND POLYPECTOMY  11/2003   Archie Endo 07/08/2010  . EYE SURGERY    . HEMORROIDECTOMY    . LUMBAR LAMINECTOMY  1999  . LUMBAR LAMINECTOMY/DECOMPRESSION MICRODISCECTOMY  09/1999   Archie Endo 07/08/2010   . POSTERIOR FUSION LUMBAR SPINE  2014  . RIGHT/LEFT HEART CATH AND CORONARY ANGIOGRAPHY N/A 11/15/2016   Procedure: RIGHT/LEFT HEART CATH AND CORONARY ANGIOGRAPHY;  Surgeon: [name], MD;  Location: [facility];  Service: Cardiovascular;  Laterality: N/A;  . SKIN GRAFT SPLIT THICKNESS LEG / FOOT Left 09/2000   Archie Endo 07/08/2010  . TEE WITHOUT CARDIOVERSION N/A 12/07/2016   Procedure: TRANSESOPHAGEAL ECHOCARDIOGRAM (TEE);  Surgeon: [name], MD;  Location: [facility];  Service: Open Heart Surgery;  Laterality: N/A;  . TONSILLECTOMY    . TRANSCATHETER AORTIC VALVE REPLACEMENT, TRANSFEMORAL  12/07/2016   Oletta Lamas  Sapien 3 THV (size 26 mm, model # 9600TFX, serial # [id number]    There were no vitals filed for this visit.   Subjective Assessment - 04/19/17 1452    Subjective  I had one bad vertigo spell in 2014 or 2015 and went to the hospital because they thought I had a stroke. It turned out to be inner ear (pt describes treatment for BPPV that made her better). My dizziness has gotten worse for no reason. I think it has something to do with my neck. Saw neck MD (Dr. Saintclair Halsted) this morning and he doesn't think it's because of my neck. He did order an MRI.     Patient is accompained by:  Family member daughter, [name]    Pertinent History  progressive severe AS s/p TAVR 11/2016, mild CAD by cath 10/2016, COPD, hard of hearing,  TIA, HTN, probable CKD stage II, anemia     Patient Stated Goals  Not feel dizzy or drunk    Currently in Pain?  No/denies         [facility] PT Assessment - 04/19/17 1511      Assessment   Medical Diagnosis  BPPV    Referring Provider  [name]    Prior Therapy  years ago for vertigo      Precautions   Precautions  Fall    Precaution Comments  no falls in past 18 months      Restrictions   Weight Bearing Restrictions  No      Balance Screen   Has the patient fallen in the past 6 months  No been ~1.5 yrs since last fall;     Has the patient had a decrease in activity level because of a fear of falling?   Yes    Is the patient reluctant to leave their home because of a fear of falling?   No      Home Film/video editor residence    [place]  [place] - 2 wheels;[place] - single point      Prior Function   Level of Independence  Independent with household mobility with device;Independent with household mobility without device some days more dizzy and uses RW or cane      Cognition   Overall Cognitive Status  History of cognitive impairments - at baseline repeating stories, information      Observation/Other Assessments   Observations  moves rigidly with little to no head on body movement       Sensation   Light Touch  Impaired by gross assessment doctor recently tested her and told she had neuropathy      Posture/Postural Control   Posture/Postural Control  Postural limitations    Postural Limitations  Forward head      ROM / Strength   AROM / PROM / Strength  AROM      AROM   Overall AROM   Deficits    Overall AROM Comments  limited cervical ROM all planes; rt rotation ~45, lateral flexion ~35; Lt rotation ~50, lateral flexion 40; neck flexion ~30, extension ~30    AROM Assessment Site  --      Palpation   Palpation comment  in supine assessed bil SCM, trapezius, deep neck extensors with no excessive  tightness, tenderness or trigger points palpable; +rstricted joint ROM due to OA      Bed Mobility   Bed Mobility  Supine to Sit;Sit to Supine  Supine to Sit  4: Min assist;HOB flat    Sit to Supine  6: Modified independent (Device/Increase time)      Transfers   Transfers  Sit to Stand;Stand to Sit    Sit to Stand  4: Min guard    Stand to Sit  5: Supervision    Comments  reports dizziness upon standing and settles in <20 seconds      Ambulation/Gait   Ambulation/Gait  Yes    Ambulation/Gait Assistance  4: Min guard    Ambulation Distance (Feet)  60 Feet 100, 60    Assistive device  None    Gait Pattern  Step-through pattern;Decreased arm swing - right;Decreased arm swing - left;Decreased step length - right;Decreased step length - left;Right foot flat;Left foot flat;Shuffle;Wide base of support    Ambulation Surface  Level;Indoor      Balance   Balance Assessed  Yes      Static Standing Balance   Static Standing - Balance Support  No upper extremity supported    Static Standing - Level of Assistance  5: Stand by assistance    Static Standing - Comment/# of Minutes  wide BOS; hips in flexion, forward head      Dynamic Standing Balance   Dynamic Standing - Balance Support  Right upper extremity supported    Dynamic Standing - Level of Assistance  4: Min assist    Dynamic Standing - Balance Activities  Head turns;Head nods    Dynamic Standing - Comments  very guarded, cautious         Vestibular Assessment - 04/19/17 1500      Symptom Behavior   Type of Dizziness  -- feels drunk    Frequency of Dizziness  every day    Duration of Dizziness  usually a half day; mornings are worse (still some dizzy in the evening)     Aggravating Factors  Activity in general esp in the morning; sit to stand    Relieving Factors  Head stationary      Occulomotor Exam   Occulomotor Alignment  Normal    Spontaneous  Absent    Gaze-induced  Absent    Smooth Pursuits  Intact    Saccades   Intact      Vestibulo-Occular Reflex   VOR to Slow Head Movement  Normal    Comment  attempted HIT with pt unable to relax and allow fast enough head turns      Auditory   Comments  deaf in rt ear for years; lt has a hearing aid; no medical reason given for why one ear and not both      Other Tests   Comments  no pressure      Orthostatics   Orthostatics Comment  negative at cardiologists office         Objective measurements completed on examination: See above findings.              PT Education - 04/19/17 1614    Education provided  Yes    Education Details  results of PT eval including not BPPV; appears combination of moving head/neck less due to arthritis discomfort with less stimulation to vestibular system, then system cannot manage when she does make bigger movements AND her feeling off balance due to neuropathy    Person(s) Educated  Patient;Child(ren)    Methods  Explanation;Demonstration    Comprehension  Verbalized understanding;Returned demonstration;Need further instruction          PT  Long Term Goals - 04/20/17 0912      PT LONG TERM GOAL #1   Title  Patient will be independent with HEP for neck ROM and balance training. (Target for all LTGs 05/19/17)    Time  4    Period  Weeks    Status  New    Target Date  05/19/17      PT LONG TERM GOAL #2   Title  Patient will complete DGI with goal set as appropriate.     Time  1    Period  Weeks    Status  New      PT LONG TERM GOAL #3   Title  Patient will complete gait velocity assessment with goal set as appropriate.     Time  1    Period  Weeks    Status  New      PT LONG TERM GOAL #4   Title  Patient will demonstrate proper use of least restrictive assistive device (RW vs SPC) while ambulating modified independent 200 feet on level indoor surface.    Time  4    Period  Weeks    Status  New      PT LONG TERM GOAL #5   Title  Patient will demonstrate improved cervical rotation to at least  60 degrees both rt and left.     Time  4    Period  Weeks    Status  New             Plan - 04/19/17 1618    Clinical Impression Statement  Patient referred to PT due to h/o increasing dizziness and feeling "off balance" with MD suspecting BPPV and requesting vestibular evaluation. Patient reports her symptoms are present all day long, but definitely worse in the mornings. She describes feeling "drunk" and feels it is both "in her head" and due to being off-balance as she walks. Daughter reports a physician recently assessed patient's sensation in bil feet and reported she has neuropathy. She has limited cervical ROM due to arthritis with no muscle spasms or excessive tightness noted. She maintains very guarded posture with minimizing head movements. Her symptoms are not consistent with BPPV and she had difficulty allowing rapid head movements to definitively rule out a vestibular hypofunction. Anticipate that she can benefit from ROM exercises, balance training, and the interventions listed  below to reduce her fall risk.     History and Personal Factors relevant to plan of care:  PMH-progressive severe AS s/p TAVR 11/2016, mild CAD by cath 10/2016, COPD, hard of hearing, TIA, HTN, probable CKD stage II, anemia  Personal factors-age, expected progression (due to unclear etiology)    Clinical Presentation  Evolving    Clinical Presentation due to:  >3 co-morbidities and unclear diagnosis causing dizziness    Clinical Decision Making  Moderate    Rehab Potential  Good    Clinical Impairments Affecting Rehab Potential  bil LE neuropathy    PT Frequency  2x / week    PT Duration  4 weeks    PT Treatment/Interventions  ADLs/Self Care Home Management;Canalith Repostioning;Gait training;DME Instruction;Stair training;Functional mobility training;Therapeutic activities;Therapeutic exercise;Balance training;Neuromuscular re-education;Manual techniques;Patient/family education;Passive range of  motion;Vestibular    PT Next Visit Plan  Assess DGI and gait velocity to set goals; add to neck ROM HEP (lateral flexion) and balance for HEP (corner ex's vs [place]?) I think she needs to move more!    Consulted and Agree with Plan of Care  Patient;Family member/caregiver    Family Member Consulted  daughter, [name]       Patient will benefit from skilled therapeutic intervention in order to improve the following deficits and impairments:  Decreased knowledge of precautions, Abnormal gait, Decreased activity tolerance, Decreased balance, Decreased cognition, Decreased knowledge of use of DME, Decreased range of motion, Hypomobility, Dizziness, Increased fascial restricitons, Impaired flexibility, Impaired sensation, Postural dysfunction  Visit Diagnosis: Abnormal posture - Plan: PT plan of care cert/re-cert  Dizziness and giddiness - Plan: PT plan of care cert/re-cert  Unsteadiness on feet - Plan: PT plan of care cert/re-cert  Other abnormalities of gait and mobility - Plan: PT plan of care cert/re-cert  Other symptoms and signs involving the musculoskeletal system - Plan: PT plan of care cert/re-cert     Problem List Patient Active Problem List   Diagnosis Date Noted  . Chronic pain of right knee 03/17/2017  . Unilateral primary osteoarthritis, left knee 03/17/2017  . Unilateral primary osteoarthritis, right knee 03/17/2017  . Chronic pain of left knee 02/07/2017  . S/P TAVR (transcatheter aortic valve replacement) 12/07/2016  . Severe aortic stenosis 12/07/2016  . Fibrocystic breast disease   . IBS (irritable bowel syndrome)   . Cervical spondylolysis   . Osteopenia   . Vitamin D deficiency   . DDD (degenerative disc disease), lumbar   . Squamous cell skin cancer   . Carotid stenosis   . OSA (obstructive sleep apnea)   . Colon polyp   . Hard of hearing   . Hyperlipidemia   . COPD (chronic obstructive pulmonary disease) ([place])   . Hypothyroidism   . GERD (gastroesophageal  reflux disease)   . Spinal stenosis in cervical region 02/19/2013    Rexanne Mano, PT 04/20/2017, 9:25 AM  [facility] [address] [place], [place], [address] Phone: [phone]   Fax:  [phone]  Name: Becky Mccall MRN: [id number] Date of Birth: 05-01-1929

## 2017-04-25 ENCOUNTER — Encounter: Payer: Self-pay | Admitting: Physical Therapy

## 2017-04-25 ENCOUNTER — Ambulatory Visit: Payer: Medicare Other | Attending: Family Medicine | Admitting: Physical Therapy

## 2017-04-25 DIAGNOSIS — R42 Dizziness and giddiness: Secondary | ICD-10-CM | POA: Insufficient documentation

## 2017-04-25 DIAGNOSIS — R2681 Unsteadiness on feet: Secondary | ICD-10-CM | POA: Diagnosis present

## 2017-04-25 DIAGNOSIS — R2689 Other abnormalities of gait and mobility: Secondary | ICD-10-CM

## 2017-04-25 DIAGNOSIS — R293 Abnormal posture: Secondary | ICD-10-CM | POA: Insufficient documentation

## 2017-04-25 DIAGNOSIS — R29898 Other symptoms and signs involving the musculoskeletal system: Secondary | ICD-10-CM | POA: Diagnosis present

## 2017-04-25 NOTE — Patient Instructions (Signed)
  Use your heating pad for 5-10 minutes to warm up your neck before stretching.   Neck Rotation   Turn head slowly to look over one shoulder to the point of mild pain or stretch, then the other.  Repeat10 times per set. Do 1 sets per session. Do 2 sessions per day.  http://orth.exer.us/294   Copyright  VHI. All rights reserved.                       AROM: Lateral Neck Flexion   Slowly tilt head toward one shoulder, then the other to the point of mild pain or stretch, then tilt the other way.  Repeat 10 times per set. Do 1 sets per session. Do 2 sessions per day.  [email or web address]://[email or web address].us/296         AROM: Neck Flexion   Bend head forward to the point of mild pain or stretch, then return to looking forward.  Repeat 10 times per set. Do 1 sets per session. Do 2 sessions per day.  http://[email or web address].exer.us/298   Copyright  VHI. All rights reserved.

## 2017-04-25 NOTE — Therapy (Signed)
[facility] [address] [place], [place], [address] Phone: [phone]   Fax:  [phone]  Physical Therapy Treatment  Patient Details  Name: Becky Mccall MRN: [id number] Date of Birth: 08/09/1929 Referring Provider: [name]   Encounter Date: 04/25/2017  PT End of Session - 04/25/17 1327    Visit Number  2    Number of Visits  9    Date for PT Re-Evaluation  06/18/17    Authorization Type  BCBS MCR    Authorization Time Period  04/19/17 to 06/18/17    PT Start Time  0759    PT Stop Time  0845    PT Time Calculation (min)  46 min    Activity Tolerance  Patient tolerated treatment well    Behavior During Therapy  [facility] for tasks assessed/performed       Past Medical History:  Diagnosis Date  . Brain aneurysm   . CAD in native artery    a.  [facility] 11/15/16 showing normal LVEDP, mild nonobstructive CAD (30% prox RCA, 40% ostial RPDA, 45% D1, 45% dLAD), severe AS with mean gradient 40.28mmHg, AVA 0.76cm2.   . Carotid stenosis    a. 1-39% by duplex 2018.  Marland Kitchen Cervical spondylolysis   . Chronic lower back pain   . CKD (chronic kidney disease), stage II   . COPD (chronic obstructive pulmonary disease) ([place])    "mild" (12/09/2016)  . DDD (degenerative disc disease), lumbar   . GERD (gastroesophageal reflux disease)   . Hard of hearing    wears hearing aides both ears  . Hyperlipidemia    can't take the meds so takes Fish Oil  . Hypertension   . Hypothyroidism   . IBS (irritable bowel syndrome)   . Intermittent dysphagia    for pills and solids  . Osteopenia   . S/P TAVR (transcatheter aortic valve replacement) 12/07/2016   26 mm Edwards Sapien 3 transcatheter heart valve placed via open left transcarotid approach   . Severe aortic stenosis    a. s/p TAVR 11/2016.  Marland Kitchen Vertigo     Past Surgical History:  Procedure Laterality Date  . ABDOMINAL HYSTERECTOMY    . APPENDECTOMY    . BACK SURGERY  2009   "on her lower back; not sure what they did" (12/09/2016)  . BLEPHAROPLASTY Bilateral   . CARDIAC CATHETERIZATION    . CARDIAC VALVE REPLACEMENT  12/07/2016   Edwards Sapien 3 THV (size 26 mm, model # 9600TFX, serial # [id number]  . CATARACT EXTRACTION W/ INTRAOCULAR LENS  IMPLANT, BILATERAL  1999,2000  . CATARACT EXTRACTION, BILATERAL Bilateral   . COLONOSCOPY W/ BIOPSIES AND POLYPECTOMY  11/2003   Archie Endo 07/08/2010  . EYE SURGERY    . HEMORROIDECTOMY    . LUMBAR LAMINECTOMY  1999  . LUMBAR LAMINECTOMY/DECOMPRESSION MICRODISCECTOMY  09/1999   Archie Endo 07/08/2010   . POSTERIOR FUSION LUMBAR SPINE  2014  . RIGHT/LEFT HEART CATH AND CORONARY ANGIOGRAPHY N/A 11/15/2016   Procedure: RIGHT/LEFT HEART CATH AND CORONARY ANGIOGRAPHY;  Surgeon: [name], MD;  Location: [facility];  Service: Cardiovascular;  Laterality: N/A;  . SKIN GRAFT SPLIT THICKNESS LEG / FOOT Left 09/2000   Archie Endo 07/08/2010  . TEE WITHOUT CARDIOVERSION N/A 12/07/2016   Procedure: TRANSESOPHAGEAL ECHOCARDIOGRAM (TEE);  Surgeon: [name], MD;  Location: [facility];  Service: Open Heart Surgery;  Laterality: N/A;  . TONSILLECTOMY    . TRANSCATHETER AORTIC VALVE REPLACEMENT, TRANSFEMORAL  12/07/2016   Oletta Lamas  Sapien 3 THV (size 26 mm, model # 9600TFX, serial # [id number]    There were no vitals filed for this visit.  Subjective Assessment - 04/25/17 0801    Subjective  Dizzziness is not quite as bad as other mornings. Reports her neck muscles do not feel tight, "I think it's in my bones."    Patient is accompained by:  Family member daughter, [name]    Pertinent History  progressive severe AS s/p TAVR 11/2016, mild CAD by cath 10/2016, COPD, hard of hearing, TIA, HTN, probable CKD stage II, anemia     Patient Stated Goals  Not feel dizzy or drunk    Currently in Pain?  No/denies         [facility] PT Assessment - 04/25/17 0001      Functional Gait  Assessment   Gait assessed   Yes    Gait Level Surface  Walks 20 ft,  slow speed, abnormal gait pattern, evidence for imbalance or deviates 10-15 in outside of the 12 in walkway width. Requires more than 7 sec to ambulate 20 ft. 13.31 sec    Change in Gait Speed  Makes only minor adjustments to walking speed, or accomplishes a change in speed with significant gait deviations, deviates 10-15 in outside the 12 in walkway width, or changes speed but loses balance but is able to recover and continue walking.    Gait with Horizontal Head Turns  Performs head turns smoothly with slight change in gait velocity (eg, minor disruption to smooth gait path), deviates 6-10 in outside 12 in walkway width, or uses an assistive device.    Gait with Vertical Head Turns  Performs task with slight change in gait velocity (eg, minor disruption to smooth gait path), deviates 6 - 10 in outside 12 in walkway width or uses assistive device    Gait and Pivot Turn  Turns slowly, requires verbal cueing, or requires several small steps to catch balance following turn and stop    Step Over Obstacle  Is able to step over one shoe box (4.5 in total height) but must slow down and adjust steps to clear box safely. May require verbal cueing.    Gait with Narrow Base of Support  Ambulates less than 4 steps heel to toe or cannot perform without assistance. 2 steps    Gait with Eyes Closed  Walks 20 ft, slow speed, abnormal gait pattern, evidence for imbalance, deviates 10-15 in outside 12 in walkway width. Requires more than 9 sec to ambulate 20 ft.    Ambulating Backwards  Walks 20 ft, slow speed, abnormal gait pattern, evidence for imbalance, deviates 10-15 in outside 12 in walkway width.    Steps  Two feet to a stair, must use rail.    Total Score  11                  [facility] Adult PT Treatment/Exercise - 04/25/17 1318      Bed Mobility   Bed Mobility  Supine to Sit;Sit to Supine    Supine to Sit  4: Min assist;HOB flat    Sit to Supine  6: Modified independent (Device/Increase time)       Transfers   Transfers  Sit to Stand;Stand to Sit    Sit to Stand  4: Min guard;With upper extremity assist pt uses wide BOS    Stand to Sit  5: Supervision      Ambulation/Gait   Ambulation/Gait  Yes    Ambulation/Gait  Assistance  4: Min guard    Ambulation Distance (Feet)  80 Feet x 3    Assistive device  None    Gait Pattern  Step-through pattern;Decreased arm swing - right;Decreased arm swing - left;Decreased step length - right;Decreased step length - left;Right foot flat;Left foot flat;Shuffle;Wide base of support    Ambulation Surface  Level;Indoor    Gait velocity  20 ft/13.31 sec= 1.5 ft/sec      Manual Therapy   Manual Therapy  Passive ROM;Manual Traction;Soft tissue mobilization    Manual therapy comments  pt supine traction and soft tissue mobilization to allow increased PROM in cervical rotation and lateral flexion    Soft tissue mobilization  cervical paraspinals; bil trapezius    Passive ROM  cervical rotation, lateral flexion    Manual Traction  ~10 lbs (to pt comfort)             PT Education - 04/25/17 1326    Education provided  Yes    Education Details  FGA results 11/30 with high fall risk; rationale for getting her to move her head more and HEP for this;     Person(s) Educated  Patient;Child(ren)    Methods  Explanation;Demonstration;Tactile cues;Verbal cues;Handout    Comprehension  Verbalized understanding;Returned demonstration;Verbal cues required;Tactile cues required;Need further instruction          PT Long Term Goals - 04/25/17 1334      PT LONG TERM GOAL #1   Title  Patient will be independent with HEP for neck ROM and balance training. (Target for all LTGs 05/19/17)    Time  4    Period  Weeks    Status  New      PT LONG TERM GOAL #2   Title  Patient will complete DGI with goal set as appropriate.     Baseline  3/4 FGA completed (11/30)    Time  1    Period  Weeks    Status  Achieved      PT LONG TERM GOAL #3   Title  Patient will  complete gait velocity assessment with goal set as appropriate.     Baseline  3/4 1.5 ft/sec    Time  1    Period  Weeks    Status  Achieved      PT LONG TERM GOAL #4   Title  Patient will demonstrate proper use of least restrictive assistive device (RW vs SPC) while ambulating modified independent 200 feet on level indoor surface.    Time  4    Period  Weeks    Status  New      PT LONG TERM GOAL #5   Title  Patient will demonstrate improved cervical rotation to at least 60 degrees both rt and left.     Time  4    Period  Weeks    Status  New      Additional Long Term Goals   Additional Long Term Goals  Yes      PT LONG TERM GOAL #6   Title  Patient will demonstrate improved balance and lesser fall risk by improving gait velocity >=1.8 ft/sec.    Baseline  1.5 ft/sec    Time  3    Period  Weeks    Status  New      PT LONG TERM GOAL #7   Title  Patient will demonstrate improved balance and lesser fall risk by improving FGA to >=16/30    Baseline  3/4 11/30    Time  3    Period  Weeks    Status  New            Plan - 04/25/17 1328    Clinical Impression Statement  Patient reports she has been working on moving her head/neck more since initial PT evaluation. Reviewed formal HEP to address neck ROM with emphasis on patient moving only to the point of mild pain/stretch. As restrictions appear to be related to joints more than muscular, did not have pt do 30 second hold, but slowly continuously moving head/neck. Next session will focus on providing exercises for balance for HEP.     Rehab Potential  Good    Clinical Impairments Affecting Rehab Potential  bil LE neuropathy    PT Frequency  2x / week    PT Duration  4 weeks    PT Treatment/Interventions  ADLs/Self Care Home Management;Canalith Repostioning;Gait training;DME Instruction;Stair training;Functional mobility training;Therapeutic activities;Therapeutic exercise;Balance training;Neuromuscular re-education;Manual  techniques;Patient/family education;Passive range of motion;Vestibular    PT Next Visit Plan  add to balance for HEP (corner ex's vs [place]?) I think she needs to move more!    Consulted and Agree with Plan of Care  Patient;Family member/caregiver    Family Member Consulted  daughter, [name]       Patient will benefit from skilled therapeutic intervention in order to improve the following deficits and impairments:  Decreased knowledge of precautions, Abnormal gait, Decreased activity tolerance, Decreased balance, Decreased cognition, Decreased knowledge of use of DME, Decreased range of motion, Hypomobility, Dizziness, Increased fascial restricitons, Impaired flexibility, Impaired sensation, Postural dysfunction  Visit Diagnosis: Other symptoms and signs involving the musculoskeletal system  Other abnormalities of gait and mobility     Problem List Patient Active Problem List   Diagnosis Date Noted  . Chronic pain of right knee 03/17/2017  . Unilateral primary osteoarthritis, left knee 03/17/2017  . Unilateral primary osteoarthritis, right knee 03/17/2017  . Chronic pain of left knee 02/07/2017  . S/P TAVR (transcatheter aortic valve replacement) 12/07/2016  . Severe aortic stenosis 12/07/2016  . Fibrocystic breast disease   . IBS (irritable bowel syndrome)   . Cervical spondylolysis   . Osteopenia   . Vitamin D deficiency   . DDD (degenerative disc disease), lumbar   . Squamous cell skin cancer   . Carotid stenosis   . OSA (obstructive sleep apnea)   . Colon polyp   . Hard of hearing   . Hyperlipidemia   . COPD (chronic obstructive pulmonary disease) ([place])   . Hypothyroidism   . GERD (gastroesophageal reflux disease)   . Spinal stenosis in cervical region 02/19/2013    Rexanne Mano, PT 04/25/2017, 1:39 PM  [facility] [address] [place], [place], [address] Phone: [phone]   Fax:  [phone]  Name:  VIVIANN BROYLES MRN: [id number] Date of Birth: [birth year]

## 2017-04-27 ENCOUNTER — Ambulatory Visit: Payer: Medicare Other | Admitting: Physical Therapy

## 2017-05-05 DIAGNOSIS — M542 Cervicalgia: Secondary | ICD-10-CM | POA: Diagnosis not present

## 2017-05-05 DIAGNOSIS — I10 Essential (primary) hypertension: Secondary | ICD-10-CM | POA: Diagnosis not present

## 2017-05-05 DIAGNOSIS — Z6826 Body mass index (BMI) 26.0-26.9, adult: Secondary | ICD-10-CM | POA: Diagnosis not present

## 2017-05-06 ENCOUNTER — Ambulatory Visit: Payer: Medicare Other | Admitting: Physical Therapy

## 2017-05-06 ENCOUNTER — Encounter: Payer: Self-pay | Admitting: Physical Therapy

## 2017-05-06 DIAGNOSIS — R29898 Other symptoms and signs involving the musculoskeletal system: Secondary | ICD-10-CM

## 2017-05-06 DIAGNOSIS — R2681 Unsteadiness on feet: Secondary | ICD-10-CM

## 2017-05-06 DIAGNOSIS — R42 Dizziness and giddiness: Secondary | ICD-10-CM

## 2017-05-06 NOTE — Patient Instructions (Signed)
  Be sure to stand in a corner with your back to the walls, but not touching the walls. Place a chair back in front of you for safety (try not to hold onto it).   Feet Together, Head Motion - Eyes Open    With eyes open, feet together, move head slowly: looking up and down x 10, left and right x 10.  Repeat __1__ times per session. Do __1__ sessions per day.  Copyright  VHI. All rights reserved.   Feet Apart, Head Motion - Eyes closed    With eyes closed feet apart, hold for 30 seconds. Repeat __3__ times per session. Do ___1_ sessions per day.  Copyright  VHI. All rights reserved.

## 2017-05-06 NOTE — Therapy (Signed)
[facility] [address] [place], [place], [address] Phone: [phone]   Fax:  [phone]  Physical Therapy Treatment  Patient Details  Name: Becky Mccall MRN: [id number] Date of Birth: [birth year] Referring Provider: [name]   Encounter Date: 05/06/2017  PT End of Session - 05/06/17 1515    Visit Number  3    Number of Visits  9    Date for PT Re-Evaluation  06/18/17    Authorization Type  BCBS MCR    Authorization Time Period  04/19/17 to 06/18/17    PT Start Time  1402    PT Stop Time  1446    PT Time Calculation (min)  44 min    Activity Tolerance  Patient tolerated treatment well    Behavior During Therapy  [facility] for tasks assessed/performed       Past Medical History:  Diagnosis Date  . Brain aneurysm   . CAD in native artery    a.  [facility] 11/15/16 showing normal LVEDP, mild nonobstructive CAD (30% prox RCA, 40% ostial RPDA, 45% D1, 45% dLAD), severe AS with mean gradient 40.15mmHg, AVA 0.76cm2.   . Carotid stenosis    a. 1-39% by duplex 2018.  Marland Kitchen Cervical spondylolysis   . Chronic lower back pain   . CKD (chronic kidney disease), stage II   . COPD (chronic obstructive pulmonary disease) ([place])    "mild" (12/09/2016)  . DDD (degenerative disc disease), lumbar   . GERD (gastroesophageal reflux disease)   . Hard of hearing    wears hearing aides both ears  . Hyperlipidemia    can't take the meds so takes Fish Oil  . Hypertension   . Hypothyroidism   . IBS (irritable bowel syndrome)   . Intermittent dysphagia    for pills and solids  . Osteopenia   . S/P TAVR (transcatheter aortic valve replacement) 12/07/2016   26 mm Edwards Sapien 3 transcatheter heart valve placed via open left transcarotid approach   . Severe aortic stenosis    a. s/p TAVR 11/2016.  Marland Kitchen Vertigo     Past Surgical History:  Procedure Laterality Date  . ABDOMINAL HYSTERECTOMY    . APPENDECTOMY    . BACK SURGERY  2009    "on her lower back; not sure what they did" (12/09/2016)  . BLEPHAROPLASTY Bilateral   . CARDIAC CATHETERIZATION    . CARDIAC VALVE REPLACEMENT  12/07/2016   Edwards Sapien 3 THV (size 26 mm, model # 9600TFX, serial # [id number]  . CATARACT EXTRACTION W/ INTRAOCULAR LENS  IMPLANT, BILATERAL  1999,2000  . CATARACT EXTRACTION, BILATERAL Bilateral   . COLONOSCOPY W/ BIOPSIES AND POLYPECTOMY  11/2003   Archie Endo 07/08/2010  . EYE SURGERY    . HEMORROIDECTOMY    . LUMBAR LAMINECTOMY  1999  . LUMBAR LAMINECTOMY/DECOMPRESSION MICRODISCECTOMY  09/1999   Archie Endo 07/08/2010   . POSTERIOR FUSION LUMBAR SPINE  2014  . RIGHT/LEFT HEART CATH AND CORONARY ANGIOGRAPHY N/A 11/15/2016   Procedure: RIGHT/LEFT HEART CATH AND CORONARY ANGIOGRAPHY;  Surgeon: [name], MD;  Location: [facility];  Service: Cardiovascular;  Laterality: N/A;  . SKIN GRAFT SPLIT THICKNESS LEG / FOOT Left 09/2000   Archie Endo 07/08/2010  . TEE WITHOUT CARDIOVERSION N/A 12/07/2016   Procedure: TRANSESOPHAGEAL ECHOCARDIOGRAM (TEE);  Surgeon: [name], MD;  Location: [facility];  Service: Open Heart Surgery;  Laterality: N/A;  . TONSILLECTOMY    . TRANSCATHETER AORTIC VALVE REPLACEMENT, TRANSFEMORAL  12/07/2016  Edwards Sapien 3 THV (size 26 mm, model # 9600TFX, serial # [id number]    There were no vitals filed for this visit.  Subjective Assessment - 05/06/17 1406    Subjective  Had her neck MRI yesterday. They said I have "an ugly neck." Lots of arthritis and 2 areas that are fused. Stated got really dizzy with "things going around" when she sat up on table after MRI. "it didn't last long."    Patient is accompained by:  Family member daughter, [name]    Pertinent History  progressive severe AS s/p TAVR 11/2016, mild CAD by cath 10/2016, COPD, hard of hearing, TIA, HTN, probable CKD stage II, anemia     Patient Stated Goals  Not feel dizzy or drunk    Currently in Pain?  No/denies             Vestibular Assessment -  05/06/17 1501      Symptom Behavior   Type of Dizziness  Spinning    Frequency of Dizziness  every day    Duration of Dizziness  this dizziness goes away quickly    Aggravating Factors  Supine to sit;Forward bending looking down    Relieving Factors  Head stationary      Positional Testing   Dix-Hallpike  Dix-Hallpike Right;Dix-Hallpike Left      Dix-Hallpike Right   Dix-Hallpike Right Duration  10 sec    Dix-Hallpike Right Symptoms  No nystagmus +symptoms "things are moving"; closing eyes      Dix-Hallpike Left   Dix-Hallpike Left Duration  45 sec    Dix-Hallpike Left Symptoms  Other (comment) could not open eyes, but thru eyelids could see nystagmus              [facility] Adult PT Treatment/Exercise - 05/06/17 1508      Exercises   Exercises  Neck      Neck Exercises: Seated   Lateral Flexion  Right;Left;Limitations    Lateral Flexion Limitations  with hand holding edge of seat for shoulder depression/stability; began to have pain up into post scalp over her ear each side and stopped stretch; did not give for HEP at this time      Vestibular Treatment/Exercise - 05/06/17 0001      Vestibular Treatment/Exercise   Vestibular Treatment Provided  Canalith Repositioning    Canalith Repositioning  Epley Manuever Right;Epley Manuever Left       EPLEY MANUEVER RIGHT   Number of Reps   1    Overall Response  -- did not reassess due to neck/back pain    Response Details   +"eyes moving" at each step; limited neck extension so ?effectiveness       EPLEY MANUEVER LEFT   Number of Reps   1    Overall Response   -- did not reassess due to neck/back pain     RESPONSE DETAILS LEFT  +"eyes moving" at each step; limited neck extension so ?effectiveness         Balance Exercises - 05/06/17 1506      Balance Exercises: Standing   Standing Eyes Opened  Narrow base of support (BOS);Head turns;Solid surface lt<>rt x 10; up, down x 10    Standing Eyes Closed  Narrow base of  support (BOS);Wide (BOA);Solid surface feet apart min sway; feet together slightly more sway        PT Education - 05/06/17 1514    Education provided  Yes    Education Details  see additions to  HEP; what is BPPV, how to treat, how she will feel during and after treatment    Person(s) Educated  Patient;Child(ren)    Methods  Explanation;Demonstration;Handout    Comprehension  Verbalized understanding;Returned demonstration;Need further instruction          PT Long Term Goals - 04/25/17 1334      PT LONG TERM GOAL #1   Title  Patient will be independent with HEP for neck ROM and balance training. (Target for all LTGs 05/19/17)    Time  4    Period  Weeks    Status  New      PT LONG TERM GOAL #2   Title  Patient will complete DGI with goal set as appropriate.     Baseline  3/4 FGA completed (11/30)    Time  1    Period  Weeks    Status  Achieved      PT LONG TERM GOAL #3   Title  Patient will complete gait velocity assessment with goal set as appropriate.     Baseline  3/4 1.5 ft/sec    Time  1    Period  Weeks    Status  Achieved      PT LONG TERM GOAL #4   Title  Patient will demonstrate proper use of least restrictive assistive device (RW vs SPC) while ambulating modified independent 200 feet on level indoor surface.    Time  4    Period  Weeks    Status  New      PT LONG TERM GOAL #5   Title  Patient will demonstrate improved cervical rotation to at least 60 degrees both rt and left.     Time  4    Period  Weeks    Status  New      Additional Long Term Goals   Additional Long Term Goals  Yes      PT LONG TERM GOAL #6   Title  Patient will demonstrate improved balance and lesser fall risk by improving gait velocity >=1.8 ft/sec.    Baseline  1.5 ft/sec    Time  3    Period  Weeks    Status  New      PT LONG TERM GOAL #7   Title  Patient will demonstrate improved balance and lesser fall risk by improving FGA to >=16/30    Baseline  3/4 11/30    Time  3     Period  Weeks    Status  New            Plan - 05/06/17 1519    Clinical Impression Statement  Today pt described feeling she had getting up from MRI test (supine to sit) and very much sounded like BPPV. +bil Hallpike-Dix although she could not keep eyes open to see direction of nystagmus clearly. Performed bil Epley maneuvers with +symptoms at each step although less than ideal positioning due to limited neck extension. Added to HEP for balance training with daughter agreeing to be with patient when she attempts these exercises. Limited time to address neck this session and attempted to add lateral flexion stretch to HEP but caused referred pain up neck and over her ear (characteristic of trapezius referred pain pattern). Will continue to benefit from PT.     Rehab Potential  Good    Clinical Impairments Affecting Rehab Potential  bil LE neuropathy    PT Frequency  2x / week    PT Duration  4 weeks    PT Treatment/Interventions  ADLs/Self Care Home Management;Canalith Repostioning;Gait training;DME Instruction;Stair training;Functional mobility training;Therapeutic activities;Therapeutic exercise;Balance training;Neuromuscular re-education;Manual techniques;Patient/family education;Passive range of motion;Vestibular    PT Next Visit Plan  check for posterior BPPV bil and how she's felt since last session; try heat to neck prior to manual therapy for neck followed by AROM; add chin tucks in supine/sitting? add seated posterior shoulder rolls    Consulted and Agree with Plan of Care  Patient;Family member/caregiver    Family Member Consulted  daughter, [name]       Patient will benefit from skilled therapeutic intervention in order to improve the following deficits and impairments:  Decreased knowledge of precautions, Abnormal gait, Decreased activity tolerance, Decreased balance, Decreased cognition, Decreased knowledge of use of DME, Decreased range of motion, Hypomobility, Dizziness,  Increased fascial restricitons, Impaired flexibility, Impaired sensation, Postural dysfunction  Visit Diagnosis: Other symptoms and signs involving the musculoskeletal system  Dizziness and giddiness  Unsteadiness on feet     Problem List Patient Active Problem List   Diagnosis Date Noted  . Chronic pain of right knee 03/17/2017  . Unilateral primary osteoarthritis, left knee 03/17/2017  . Unilateral primary osteoarthritis, right knee 03/17/2017  . Chronic pain of left knee 02/07/2017  . S/P TAVR (transcatheter aortic valve replacement) 12/07/2016  . Severe aortic stenosis 12/07/2016  . Fibrocystic breast disease   . IBS (irritable bowel syndrome)   . Cervical spondylolysis   . Osteopenia   . Vitamin D deficiency   . DDD (degenerative disc disease), lumbar   . Squamous cell skin cancer   . Carotid stenosis   . OSA (obstructive sleep apnea)   . Colon polyp   . Hard of hearing   . Hyperlipidemia   . COPD (chronic obstructive pulmonary disease) ([place])   . Hypothyroidism   . GERD (gastroesophageal reflux disease)   . Spinal stenosis in cervical region 02/19/2013    Rexanne Mano, PT 05/06/2017, 3:26 PM  [facility] [address] [place], [place], [address] Phone: [phone]   Fax:  [phone]  Name: MAHASIN RIVIERE MRN: [id number] Date of Birth: 06/02/1929

## 2017-05-09 ENCOUNTER — Ambulatory Visit: Payer: Medicare Other | Admitting: Physical Therapy

## 2017-05-09 ENCOUNTER — Encounter: Payer: Self-pay | Admitting: Physical Therapy

## 2017-05-09 DIAGNOSIS — R29898 Other symptoms and signs involving the musculoskeletal system: Secondary | ICD-10-CM | POA: Diagnosis not present

## 2017-05-09 DIAGNOSIS — R293 Abnormal posture: Secondary | ICD-10-CM

## 2017-05-09 NOTE — Patient Instructions (Signed)
Shoulder Protraction and Retraction    With arms at side, elbow bent at 90, holding ends of red band. Push forward, as far as you can with shoulder blades pushing apart. Hold for 3 seconds. And relax arms. Repeat _5-10__ times. Do _1__ times per day.   Shoulder Roll    Move shoulders up hold for 2 seconds, back pinching shoulder blades together for 2 seconds, then down and relax. Continue circling shoulders backward _5__ times. Do __1-2_ times per day.  Copyright  VHI. All rights reserved.

## 2017-05-09 NOTE — Therapy (Signed)
[facility] [address] [place], [place], [address] Phone: [phone]   Fax:  [phone]  Physical Therapy Treatment  Patient Details  Name: Becky Mccall MRN: [id number] Date of Birth: 03/03/1929 Referring Provider: [name]   Encounter Date: 05/09/2017  PT End of Session - 05/09/17 1951    Visit Number  4    Number of Visits  9    Date for PT Re-Evaluation  06/18/17    Authorization Type  [employer] MCR    Authorization Time Period  04/19/17 to 06/18/17    PT Start Time  1405    PT Stop Time  1450    PT Time Calculation (min)  45 min    Activity Tolerance  Patient tolerated treatment well    Behavior During Therapy  [facility] for tasks assessed/performed       Past Medical History:  Diagnosis Date  . Brain aneurysm   . CAD in native artery    a.  [facility] 11/15/16 showing normal LVEDP, mild nonobstructive CAD (30% prox RCA, 40% ostial RPDA, 45% D1, 45% dLAD), severe AS with mean gradient 40.101mmHg, AVA 0.76cm2.   . Carotid stenosis    a. 1-39% by duplex 2018.  Marland Kitchen Cervical spondylolysis   . Chronic lower back pain   . CKD (chronic kidney disease), stage II   . COPD (chronic obstructive pulmonary disease) ([place])    "mild" (12/09/2016)  . DDD (degenerative disc disease), lumbar   . GERD (gastroesophageal reflux disease)   . Hard of hearing    wears hearing aides both ears  . Hyperlipidemia    can't take the meds so takes Fish Oil  . Hypertension   . Hypothyroidism   . IBS (irritable bowel syndrome)   . Intermittent dysphagia    for pills and solids  . Osteopenia   . S/P TAVR (transcatheter aortic valve replacement) 12/07/2016   26 mm Edwards Sapien 3 transcatheter heart valve placed via open left transcarotid approach   . Severe aortic stenosis    a. s/p TAVR 11/2016.  Marland Kitchen Vertigo     Past Surgical History:  Procedure Laterality Date  . ABDOMINAL HYSTERECTOMY    . APPENDECTOMY    . BACK SURGERY  2009   "on her lower back; not sure what they did" (12/09/2016)  . BLEPHAROPLASTY Bilateral   . CARDIAC CATHETERIZATION    . CARDIAC VALVE REPLACEMENT  12/07/2016   Edwards Sapien 3 THV (size 26 mm, model # 9600TFX, serial # [id number]  . CATARACT EXTRACTION W/ INTRAOCULAR LENS  IMPLANT, BILATERAL  1999,2000  . CATARACT EXTRACTION, BILATERAL Bilateral   . COLONOSCOPY W/ BIOPSIES AND POLYPECTOMY  11/2003   Archie Endo 07/08/2010  . EYE SURGERY    . HEMORROIDECTOMY    . LUMBAR LAMINECTOMY  1999  . LUMBAR LAMINECTOMY/DECOMPRESSION MICRODISCECTOMY  09/1999   Archie Endo 07/08/2010   . POSTERIOR FUSION LUMBAR SPINE  2014  . RIGHT/LEFT HEART CATH AND CORONARY ANGIOGRAPHY N/A 11/15/2016   Procedure: RIGHT/LEFT HEART CATH AND CORONARY ANGIOGRAPHY;  Surgeon: [name], MD;  Location: [facility];  Service: Cardiovascular;  Laterality: N/A;  . SKIN GRAFT SPLIT THICKNESS LEG / FOOT Left 09/2000   Archie Endo 07/08/2010  . TEE WITHOUT CARDIOVERSION N/A 12/07/2016   Procedure: TRANSESOPHAGEAL ECHOCARDIOGRAM (TEE);  Surgeon: [name], MD;  Location: [facility];  Service: Open Heart Surgery;  Laterality: N/A;  . TONSILLECTOMY    . TRANSCATHETER AORTIC VALVE REPLACEMENT, TRANSFEMORAL  12/07/2016   Oletta Lamas  Sapien 3 THV (size 26 mm, model # 9600TFX, serial # [id number]    There were no vitals filed for this visit.  Subjective Assessment - 05/09/17 1406    Subjective  Can't say that her dizziness is any better.  It varies.  Hard to tell.  Her neck is feeling a bit better    Patient is accompained by:  Family member daughter, [name]    Pertinent History  progressive severe AS s/p TAVR 11/2016, mild CAD by cath 10/2016, COPD, hard of hearing, TIA, HTN, probable CKD stage II, anemia     Patient Stated Goals  Not feel dizzy or drunk    Currently in Pain?  No/denies                      [facility] Adult PT Treatment/Exercise - 05/09/17 1948      Neck Exercises: Seated   Shoulder Rolls  Backwards;10 reps     Other Seated Exercise  shoulder protraction with red band x 5 reps x 2 sets      Neck Exercises: Supine   Neck Retraction  5 reps;5 secs    Cervical Rotation  Right;Left PROM to AROM    Lateral Flexion  Right;Left 2 reps with continued incr pain posterior scalp, no stretch      Palpation of cervical spine with no muscle spasms palpable except for suboccipital muscles. Light traction and PROM to relax these muscles        PT Education - 05/09/17 1950    Education Details  see additions to HEP    Person(s) Educated  Patient;Child(ren)    Methods  Explanation;Demonstration;Tactile cues;Verbal cues;Handout    Comprehension  Verbalized understanding;Returned demonstration;Verbal cues required;Tactile cues required;Need further instruction          PT Long Term Goals - 04/25/17 1334      PT LONG TERM GOAL #1   Title  Patient will be independent with HEP for neck ROM and balance training. (Target for all LTGs 05/19/17)    Time  4    Period  Weeks    Status  New      PT LONG TERM GOAL #2   Title  Patient will complete DGI with goal set as appropriate.     Baseline  3/4 FGA completed (11/30)    Time  1    Period  Weeks    Status  Achieved      PT LONG TERM GOAL #3   Title  Patient will complete gait velocity assessment with goal set as appropriate.     Baseline  3/4 1.5 ft/sec    Time  1    Period  Weeks    Status  Achieved      PT LONG TERM GOAL #4   Title  Patient will demonstrate proper use of least restrictive assistive device (RW vs SPC) while ambulating modified independent 200 feet on level indoor surface.    Time  4    Period  Weeks    Status  New      PT LONG TERM GOAL #5   Title  Patient will demonstrate improved cervical rotation to at least 60 degrees both rt and left.     Time  4    Period  Weeks    Status  New      Additional Long Term Goals   Additional Long Term Goals  Yes      PT LONG TERM GOAL #6  Title  Patient will demonstrate improved  balance and lesser fall risk by improving gait velocity >=1.8 ft/sec.    Baseline  1.5 ft/sec    Time  3    Period  Weeks    Status  New      PT LONG TERM GOAL #7   Title  Patient will demonstrate improved balance and lesser fall risk by improving FGA to >=16/30    Baseline  3/4 11/30    Time  3    Period  Weeks    Status  New            Plan - 05/09/17 1952    Clinical Impression Statement  Began session with pt in supine with hot pack under her neck and upper back while working on cervical PROM prgressing to AROM to cervical exercises (heat removed after initial 5 minutes). Instructed in scapular exercises to improve relaxation of bil trapezius and rhomboids. Patient reported neck feeling better at end of session.     Rehab Potential  Good    Clinical Impairments Affecting Rehab Potential  bil LE neuropathy    PT Frequency  2x / week    PT Duration  4 weeks    PT Treatment/Interventions  ADLs/Self Care Home Management;Canalith Repostioning;Gait training;DME Instruction;Stair training;Functional mobility training;Therapeutic activities;Therapeutic exercise;Balance training;Neuromuscular re-education;Manual techniques;Patient/family education;Passive range of motion;Vestibular    PT Next Visit Plan  try UE ergometer backwards for warm-up  prior to manual therapy for neck followed by AROM; add chin tucks in supine to HEP; after gettting neck moving, have pt up and ambulating with head turns; ?try SPC  ? try VORx1    Consulted and Agree with Plan of Care  Patient;Family member/caregiver    Family Member Consulted  daughter, [name]       Patient will benefit from skilled therapeutic intervention in order to improve the following deficits and impairments:  Decreased knowledge of precautions, Abnormal gait, Decreased activity tolerance, Decreased balance, Decreased cognition, Decreased knowledge of use of DME, Decreased range of motion, Hypomobility, Dizziness, Increased fascial  restricitons, Impaired flexibility, Impaired sensation, Postural dysfunction  Visit Diagnosis: Other symptoms and signs involving the musculoskeletal system  Abnormal posture     Problem List Patient Active Problem List   Diagnosis Date Noted  . Chronic pain of right knee 03/17/2017  . Unilateral primary osteoarthritis, left knee 03/17/2017  . Unilateral primary osteoarthritis, right knee 03/17/2017  . Chronic pain of left knee 02/07/2017  . S/P TAVR (transcatheter aortic valve replacement) 12/07/2016  . Severe aortic stenosis 12/07/2016  . Fibrocystic breast disease   . IBS (irritable bowel syndrome)   . Cervical spondylolysis   . Osteopenia   . Vitamin D deficiency   . DDD (degenerative disc disease), lumbar   . Squamous cell skin cancer   . Carotid stenosis   . OSA (obstructive sleep apnea)   . Colon polyp   . Hard of hearing   . Hyperlipidemia   . COPD (chronic obstructive pulmonary disease) ([place])   . Hypothyroidism   . GERD (gastroesophageal reflux disease)   . Spinal stenosis in cervical region 02/19/2013    Rexanne Mano, PT 05/09/2017, 7:59 PM  [facility] [address] [place], [place], [address] Phone: [phone]   Fax:  [phone]  Name: SHERY WAUNEKA MRN: [id number] Date of Birth: Jul 04, 1929

## 2017-05-13 ENCOUNTER — Ambulatory Visit: Payer: Medicare Other | Admitting: Physical Therapy

## 2017-05-16 ENCOUNTER — Ambulatory Visit: Payer: Medicare Other | Admitting: Physical Therapy

## 2017-05-17 ENCOUNTER — Telehealth (INDEPENDENT_AMBULATORY_CARE_PROVIDER_SITE_OTHER): Payer: Self-pay | Admitting: Orthopaedic Surgery

## 2017-05-17 NOTE — Telephone Encounter (Signed)
Patients daughter called and left vm to see if it has been enough time for her to receive another injection. She had a gel injection the last time and she said it didn't help her that much, she wants to try a different kind, maybe cortisone. Please advise when I can call and make this appt for her. # [phone]

## 2017-05-17 NOTE — Telephone Encounter (Signed)
Cortisone is fine, you can make an appt for her if you don't mind

## 2017-05-18 ENCOUNTER — Ambulatory Visit: Payer: Medicare Other | Admitting: Physical Therapy

## 2017-05-25 ENCOUNTER — Ambulatory Visit (INDEPENDENT_AMBULATORY_CARE_PROVIDER_SITE_OTHER): Payer: Medicare Other | Admitting: Orthopaedic Surgery

## 2017-05-25 ENCOUNTER — Encounter (INDEPENDENT_AMBULATORY_CARE_PROVIDER_SITE_OTHER): Payer: Self-pay | Admitting: Orthopaedic Surgery

## 2017-05-25 DIAGNOSIS — M1712 Unilateral primary osteoarthritis, left knee: Secondary | ICD-10-CM

## 2017-05-25 MED ORDER — METHYLPREDNISOLONE ACETATE 40 MG/ML IJ SUSP
40.0000 mg | INTRAMUSCULAR | Status: AC | PRN
  Administered 2017-05-25: 40 mg via INTRA_ARTICULAR

## 2017-05-25 MED ORDER — LIDOCAINE HCL 1 % IJ SOLN
3.0000 mL | INTRAMUSCULAR | Status: AC | PRN
  Administered 2017-05-25: 3 mL

## 2017-05-25 NOTE — Progress Notes (Signed)
   Procedure Note  Patient: Becky Mccall             Date of Birth: 02/09/1930           MRN: [id number]             Visit Date: 05/25/2017  Procedures: Visit Diagnoses: Unilateral primary osteoarthritis, left knee  Large Joint Inj: L knee on 05/25/2017 3:43 PM Indications: diagnostic evaluation and pain Details: 22 G 1.5 in needle, superolateral approach  Arthrogram: No  Medications: 3 mL lidocaine 1 %; 40 mg methylPREDNISolone acetate 40 MG/ML Outcome: tolerated well, no immediate complications Procedure, treatment alternatives, risks and benefits explained, specific risks discussed. Consent was given by the patient. Immediately prior to procedure a time out was called to verify the correct patient, procedure, equipment, support staff and site/side marked as required. Patient was prepped and draped in the usual sterile fashion.     The patient has known osteoarthritis and degenerative joint disease of her left knee.  She tried a hyaluronic acid injection and that is not helped at all.  She is on blood thinning medication due to recent aortic valve replacement.  He would like to have a steroid injection today.  She still having significant pain with her left knee.  On examination she does have patellofemoral crepitation of the left knee.  It is stiff with full range of motion.  There is medial and lateral joint line tenderness.  I placed a steroid injection in her left knee without difficulties.  All questions concerns were answered and addressed.  She knows to wait at least 3-4 months for repeat injection.

## 2017-08-29 ENCOUNTER — Telehealth: Payer: Self-pay | Admitting: Cardiology

## 2017-08-29 NOTE — Telephone Encounter (Signed)
[place]    Daughter states her mom was shucking corn and her hands turned blue. Daughter is concerned. She wanted mom seen sooner. I moved appt from Sept to Aug. but she wants her seen sooner than Aug.

## 2017-08-29 NOTE — Telephone Encounter (Signed)
I spoke with pt's daughter. She states patient was shucking corn and her hands turned blue. She states this only happened when she was shucking the corn. Pt denies numbness/tingling in her hands, she is able to move her arm and fingers, he states she has full sensation in her arm and hands. I informed her to continue to monitor. Pt is scheduled for f/u appt with Estella Husk, PA on 09/2017. She confirmed appt and thankful for the call

## 2017-09-22 ENCOUNTER — Other Ambulatory Visit: Payer: Self-pay | Admitting: Physician Assistant

## 2017-09-22 DIAGNOSIS — Z952 Presence of prosthetic heart valve: Secondary | ICD-10-CM

## 2017-09-23 ENCOUNTER — Encounter: Payer: Self-pay | Admitting: Physical Therapy

## 2017-09-23 NOTE — Therapy (Signed)
[facility] [address] [place], [place], [address] Phone: [phone]   Fax:  [phone]  Patient Details  Name: Becky Mccall MRN: [id number] Date of Birth: March 17, 1929 Referring Provider:  [name] MD  Encounter Date: 09/23/2017  PHYSICAL THERAPY DISCHARGE SUMMARY  Visits from Start of Care: 4  Current functional level related to goals / functional outcomes: Did not return to PT to assess   Remaining deficits: Did not return to PT to assess   Education / Equipment: Did not return to PT to assess  Plan: Patient agrees to discharge.  Patient goals were not met. Patient is being discharged due to lack of progress.  ?????      Rexanne Mano, PT 09/23/2017, 8:31 AM  [facility] [address] [place], [place], [address] Phone: [phone]   Fax:  [phone]

## 2017-09-30 DIAGNOSIS — N183 Chronic kidney disease, stage 3 (moderate): Secondary | ICD-10-CM | POA: Diagnosis not present

## 2017-09-30 DIAGNOSIS — I129 Hypertensive chronic kidney disease with stage 1 through stage 4 chronic kidney disease, or unspecified chronic kidney disease: Secondary | ICD-10-CM | POA: Diagnosis not present

## 2017-09-30 DIAGNOSIS — M79605 Pain in left leg: Secondary | ICD-10-CM | POA: Diagnosis not present

## 2017-09-30 DIAGNOSIS — M79604 Pain in right leg: Secondary | ICD-10-CM | POA: Diagnosis not present

## 2017-10-10 DIAGNOSIS — I10 Essential (primary) hypertension: Secondary | ICD-10-CM | POA: Insufficient documentation

## 2017-10-10 NOTE — Progress Notes (Signed)
Cardiology Office Note    Date:  10/11/2017   ID:  Becky, Mccall [birth year], MRN [id number]  PCP:  [name], MD  Cardiologist: Fransico Him, MD  Chief Complaint  Patient presents with  . Follow-up    History of Present Illness:  Becky Mccall is a 84 y.o. female with history of severe AS status post TAVR 11/2016 mild CAD by cath 10/2016, history of TIA, HTN, CKD stage II, HLD, COPD.  Patient last seen in the office 03/2017 complaining of dizziness.  She does have chronic vertigo.  They were concerned because her heart rates reported from home were in the 40s.  Carotid Dopplers were -11/2016.  Holter monitor 03/16/2017 normal sinus rhythm are average heart rate 73 bpm.  Patient comes in today accompanied by her daughter.  She said she was shucking corn in her hands turn blue but when she stopped the went back to being normal.  Also complains of pain running down her back down the back of her leg.  She was placed on steroids by PCP for sciatica.  Pain is worse in the morning and feels like a cramping.  She also has home health checking on her and they told her her heart was skipping so they were concerned.  She has no symptoms of this.  Chronic vertigo.   Past Medical History:  Diagnosis Date  . Brain aneurysm   . CAD in native artery    a.  [facility] 11/15/16 showing normal LVEDP, mild nonobstructive CAD (30% prox RCA, 40% ostial RPDA, 45% D1, 45% dLAD), severe AS with mean gradient 40.5mmHg, AVA 0.76cm2.   . Carotid stenosis    a. 1-39% by duplex 2018.  Marland Kitchen Cervical spondylolysis   . Chronic lower back pain   . CKD (chronic kidney disease), stage II   . COPD (chronic obstructive pulmonary disease) ([place])    "mild" (12/09/2016)  . DDD (degenerative disc disease), lumbar   . GERD (gastroesophageal reflux disease)   . Hard of hearing    wears hearing aides both ears  . Hyperlipidemia    can't take the meds so takes Fish Oil  . Hypertension   . Hypothyroidism   . IBS  (irritable bowel syndrome)   . Intermittent dysphagia    for pills and solids  . Osteopenia   . S/P TAVR (transcatheter aortic valve replacement) 12/07/2016   26 mm Edwards Sapien 3 transcatheter heart valve placed via open left transcarotid approach   . Severe aortic stenosis    a. s/p TAVR 11/2016.  Marland Kitchen Vertigo     Past Surgical History:  Procedure Laterality Date  . ABDOMINAL HYSTERECTOMY    . APPENDECTOMY    . BACK SURGERY  2009   "on her lower back; not sure what they did" (12/09/2016)  . BLEPHAROPLASTY Bilateral   . CARDIAC CATHETERIZATION    . CARDIAC VALVE REPLACEMENT  12/07/2016   Edwards Sapien 3 THV (size 26 mm, model # 9600TFX, serial # [id number]  . CATARACT EXTRACTION W/ INTRAOCULAR LENS  IMPLANT, BILATERAL  1999,2000  . CATARACT EXTRACTION, BILATERAL Bilateral   . COLONOSCOPY W/ BIOPSIES AND POLYPECTOMY  11/2003   Archie Endo 07/08/2010  . EYE SURGERY    . HEMORROIDECTOMY    . LUMBAR LAMINECTOMY  1999  . LUMBAR LAMINECTOMY/DECOMPRESSION MICRODISCECTOMY  09/1999   Archie Endo 07/08/2010   . POSTERIOR FUSION LUMBAR SPINE  2014  . RIGHT/LEFT HEART CATH AND CORONARY ANGIOGRAPHY N/A 11/15/2016   Procedure: RIGHT/LEFT HEART CATH AND  CORONARY ANGIOGRAPHY;  Surgeon: [name], MD;  Location: [facility];  Service: Cardiovascular;  Laterality: N/A;  . SKIN GRAFT SPLIT THICKNESS LEG / FOOT Left 09/2000   Archie Endo 07/08/2010  . TEE WITHOUT CARDIOVERSION N/A 12/07/2016   Procedure: TRANSESOPHAGEAL ECHOCARDIOGRAM (TEE);  Surgeon: [name], MD;  Location: [facility];  Service: Open Heart Surgery;  Laterality: N/A;  . TONSILLECTOMY    . TRANSCATHETER AORTIC VALVE REPLACEMENT, TRANSFEMORAL  12/07/2016   Edwards Sapien 3 THV (size 26 mm, model # 9600TFX, serial # [id number]    Current Medications: Current Meds  Medication Sig  . amLODipine (NORVASC) 2.5 MG tablet Take 2.5 mg by mouth daily.  Marland Kitchen aspirin EC 81 MG tablet Take 1 tablet (81 mg total) by mouth daily.  . Calcium  Carb-Cholecalciferol (CALCIUM 1000 + D PO) Take 1 tablet by mouth daily.   . Cholecalciferol (VITAMIN D) 2000 UNITS tablet Take 6,000 Units by mouth daily.   . Cinnamon 500 MG capsule Take 500 mg by mouth 2 (two) times daily.   Marland Kitchen levothyroxine (SYNTHROID, LEVOTHROID) 137 MCG tablet Take 137 mcg by mouth daily before breakfast.  . meclizine (ANTIVERT) 25 MG tablet Take 12.5-25 mg by mouth every 6 (six) hours as needed for dizziness.  . Multiple Vitamin (MULTIVITAMIN WITH MINERALS) TABS tablet Take 1 tablet by mouth daily.  . Omega-3 Fatty Acids (FISH OIL) 1000 MG CAPS Take 1,000 mg by mouth 2 (two) times daily.  . traMADol (ULTRAM) 50 MG tablet Take 1 tablet (50 mg total) by mouth every 8 (eight) hours as needed (pain not relieved by tylenol).  . Turmeric 500 MG CAPS Take 500 mg by mouth 2 (two) times daily.  . vitamin B-12 (CYANOCOBALAMIN) 1000 MCG tablet Take 1,000 mcg by mouth daily.  . vitamin C (ASCORBIC ACID) 500 MG tablet Take 500 mg by mouth daily.  . vitamin E 400 UNIT capsule Take 400 Units by mouth 2 (two) times daily.      Allergies:   Lisinopril; Relafen [nabumetone]; Statins; Tape; Welchol [colesevelam]; Zetia [ezetimibe]; Neosporin [neomycin-bacitracin zn-polymyx]; and Zinc oxide   Social History   Socioeconomic History  . Marital status: Widowed    Spouse name: irvin  . Number of children: 2  . Years of education: 10th  . Highest education level: Not on file  Occupational History  . Occupation: retired  Scientific laboratory technician  . Financial resource strain: Not on file  . Food insecurity:    Worry: Not on file    Inability: Not on file  . Transportation needs:    Medical: Not on file    Non-medical: Not on file  Tobacco Use  . Smoking status: Former Smoker    Packs/day: 2.00    Years: 40.00    Pack years: 80.00    Types: Cigarettes    Last attempt to quit: 08/02/1989    Years since quitting: 28.2  . Smokeless tobacco: Never Used  Substance and Sexual Activity  . Alcohol  use: Yes    Alcohol/week: 14.0 standard drinks    Types: 7 Standard drinks or equivalent, 7 Glasses of wine per week  . Drug use: No  . Sexual activity: Never    Birth control/protection: Surgical  Lifestyle  . Physical activity:    Days per week: Not on file    Minutes per session: Not on file  . Stress: Not on file  Relationships  . Social connections:    Talks on phone: Not on file  Gets together: Not on file    Attends religious service: Not on file    Active member of club or organization: Not on file    Attends meetings of clubs or organizations: Not on file    Relationship status: Not on file  Other Topics Concern  . Not on file  Social History Narrative   Patient lives at home with her husband   Patient drinks coffee, sodas   Patient has 2 children    Patient is right handed    Patient is retired           Family History:  The patient's family history includes Cancer - Lung in her brother, maternal aunt, and mother; Heart attack in her father.   ROS:   Please see the history of present illness.    Review of Systems  Constitution: Negative.  HENT: Positive for hearing loss.   Eyes: Negative.   Cardiovascular: Negative.   Respiratory: Negative.   Hematologic/Lymphatic: Bruises/bleeds easily.  Musculoskeletal: Positive for myalgias. Negative for joint pain.  Gastrointestinal: Negative.   Genitourinary: Negative.   Neurological: Positive for dizziness.   All other systems reviewed and are negative.   PHYSICAL EXAM:   VS:  BP 112/70   Pulse 74   Ht 5\' 6"  (1.676 m)   Wt 165 lb 6.4 oz (75 kg)   LMP  (LMP Unknown)   SpO2 95%   BMI 26.70 kg/m   Physical Exam  GEN: Well nourished, well developed, in no acute distress  Neck: no JVD, carotid bruits, or masses Cardiac:RRR; 1/6 systolic murmur with some skipping Respiratory:  clear to auscultation bilaterally, normal work of breathing GI: soft, nontender, nondistended, + BS Ext: without cyanosis, clubbing,  or edema, decreased but present distal pulses bilaterally Neuro:  Alert and Oriented x 3, Psych: euthymic mood, full affect  Wt Readings from Last 3 Encounters:  10/11/17 165 lb 6.4 oz (75 kg)  04/14/17 168 lb (76.2 kg)  03/07/17 166 lb 9.6 oz (75.6 kg)      Studies/Labs Reviewed:   EKG:  EKG is  ordered today.  The ekg ordered today demonstrates normal sinus rhythm with PVCs  Recent Labs: 11/10/2016: NT-Pro BNP 379; TSH 2.870 12/03/2016: ALT 10 12/08/2016: Magnesium 1.9 12/09/2016: Hemoglobin 11.5; Platelets 138 12/13/2016: BUN 13; Creatinine, Ser 0.90; Potassium 4.0; Sodium 137   Lipid Panel    Component Value Date/Time   CHOL 226 (H) 05/05/2013 0630   TRIG 149 05/05/2013 0630   HDL 51 05/05/2013 0630   CHOLHDL 4.4 05/05/2013 0630   VLDL 30 05/05/2013 0630   LDLCALC 145 (H) 05/05/2013 0630    Additional studies/ records that were reviewed today include:  Holter 03/16/2017 Study Highlights    Normal sinus rhythm with average heart rate 73bpm    2D echo 11/2018Study Conclusions   - Left ventricle: The cavity size was normal. There was mild   concentric hypertrophy. Systolic function was normal. The   estimated ejection fraction was in the range of 55% to 60%. Wall   motion was normal; there were no regional wall motion   abnormalities. Features are consistent with a pseudonormal left   ventricular filling pattern, with concomitant abnormal relaxation   and increased filling pressure (grade 2 diastolic dysfunction). - Aortic valve: A TAVR stent-valve bioprosthesis was present and   functioning normally. - Mitral valve: Calcified annulus. Valve area by continuity   equation (using LVOT flow): 1.67 cm^2. - Left atrium: The atrium was mildly dilated.  Carotid Dopplers 10/2018Summary:  - The left vertebral artery appears patent with antegrade flow. - Findings consistent with a 1- 83 percent stenosis involving the   left internal carotid artery. Elevated systolic  velocities noted   throughout left carotid system.  Other specific details can be found in the table(s) above. Prepared and Electronically Authenticated by  Adele Barthel, MD 2018-10-18T19:59:09    ASSESSMENT:    1. S/P TAVR (transcatheter aortic valve replacement)   2. Bilateral carotid artery stenosis   3. Mixed hyperlipidemia   4. Essential hypertension   5. Dizziness   6. PVC's (premature ventricular contractions)   7. Leg cramps      PLAN:  In order of problems listed above:  Status post TAVR for severe aortic stenosis 11/2016 has follow-up in October  Bilateral carotid artery stenosis 1 to 39% 11/2016  Mixed hyperlipidemia LDL 171 03/2017 is not on therapy  Essential hypertension blood pressure stable  Chronic dizziness secondary to vertigo  PVCs on EKG.  Patient is asymptomatic.  Would not treat.  Will check potassium and magnesium today.  Leg cramps felt secondary to sciatica.  Medication Adjustments/Labs and Tests Ordered: Current medicines are reviewed at length with the patient today.  Concerns regarding medicines are outlined above.  Medication changes, Labs and Tests ordered today are listed in the Patient Instructions below. Patient Instructions  Medication Instructions:  Your physician recommends that you continue on your current medications as directed. Please refer to the Current Medication list given to you today.   Labwork: TODAY: BMET, MAGNESIUM   Testing/Procedures: None ordered  Follow-Up: Your physician wants you to follow-up in: [place] ON October 16TH AT 2 PM   Any Other Special Instructions Will Be Listed Below (If Applicable).     If you need a refill on your cardiac medications before your next appointment, please call your pharmacy.      Sumner Boast, PA-C  10/11/2017 10:47 AM    [facility] [place], [place], [place]   [address] Phone: [phone]; Fax: [phone]

## 2017-10-11 ENCOUNTER — Ambulatory Visit: Payer: Medicare Other | Admitting: Physician Assistant

## 2017-10-11 ENCOUNTER — Encounter: Payer: Self-pay | Admitting: Physician Assistant

## 2017-10-11 VITALS — BP 112/70 | HR 74 | Ht 66.0 in | Wt 165.4 lb

## 2017-10-11 DIAGNOSIS — E782 Mixed hyperlipidemia: Secondary | ICD-10-CM

## 2017-10-11 DIAGNOSIS — I10 Essential (primary) hypertension: Secondary | ICD-10-CM

## 2017-10-11 DIAGNOSIS — Z952 Presence of prosthetic heart valve: Secondary | ICD-10-CM | POA: Diagnosis not present

## 2017-10-11 DIAGNOSIS — R42 Dizziness and giddiness: Secondary | ICD-10-CM

## 2017-10-11 DIAGNOSIS — R252 Cramp and spasm: Secondary | ICD-10-CM

## 2017-10-11 DIAGNOSIS — I6523 Occlusion and stenosis of bilateral carotid arteries: Secondary | ICD-10-CM

## 2017-10-11 DIAGNOSIS — I493 Ventricular premature depolarization: Secondary | ICD-10-CM

## 2017-10-11 LAB — BASIC METABOLIC PANEL
BUN/Creatinine Ratio: 19 (ref 12–28)
BUN: 20 mg/dL (ref 8–27)
CO2: 29 mmol/L (ref 20–29)
CREATININE: 1.07 mg/dL — AB (ref 0.57–1.00)
Calcium: 9.4 mg/dL (ref 8.7–10.3)
Chloride: 101 mmol/L (ref 96–106)
GFR calc Af Amer: 55 mL/min/{1.73_m2} — ABNORMAL LOW (ref >59)
GFR calc non Af Amer: 47 mL/min/{1.73_m2} — ABNORMAL LOW (ref >59)
GLUCOSE: 63 mg/dL — AB (ref 65–99)
POTASSIUM: 4.6 mmol/L (ref 3.5–5.2)
SODIUM: 135 mmol/L (ref 134–144)

## 2017-10-11 LAB — MAGNESIUM: MAGNESIUM: 2.5 mg/dL — AB (ref 1.6–2.3)

## 2017-10-11 NOTE — Patient Instructions (Addendum)
Medication Instructions:  Your physician recommends that you continue on your current medications as directed. Please refer to the Current Medication list given to you today.   Labwork: TODAY: BMET, MAGNESIUM   Testing/Procedures: None ordered  Follow-Up: Your physician wants you to follow-up in: [place] ON October 16TH AT 2 PM   Any Other Special Instructions Will Be Listed Below (If Applicable).     If you need a refill on your cardiac medications before your next appointment, please call your pharmacy.

## 2017-10-21 ENCOUNTER — Encounter (HOSPITAL_COMMUNITY): Payer: Self-pay | Admitting: Emergency Medicine

## 2017-10-21 ENCOUNTER — Observation Stay (HOSPITAL_COMMUNITY)
Admission: EM | Admit: 2017-10-21 | Discharge: 2017-10-23 | Disposition: A | Discharge status: Home / Self Care | Payer: Medicare Other | Attending: Internal Medicine | Admitting: Internal Medicine

## 2017-10-21 ENCOUNTER — Other Ambulatory Visit: Payer: Self-pay

## 2017-10-21 DIAGNOSIS — Z79899 Other long term (current) drug therapy: Secondary | ICD-10-CM | POA: Diagnosis not present

## 2017-10-21 DIAGNOSIS — I6523 Occlusion and stenosis of bilateral carotid arteries: Secondary | ICD-10-CM | POA: Diagnosis not present

## 2017-10-21 DIAGNOSIS — G4733 Obstructive sleep apnea (adult) (pediatric): Secondary | ICD-10-CM | POA: Diagnosis present

## 2017-10-21 DIAGNOSIS — I2510 Atherosclerotic heart disease of native coronary artery without angina pectoris: Secondary | ICD-10-CM | POA: Insufficient documentation

## 2017-10-21 DIAGNOSIS — Z87891 Personal history of nicotine dependence: Secondary | ICD-10-CM | POA: Diagnosis not present

## 2017-10-21 DIAGNOSIS — R42 Dizziness and giddiness: Secondary | ICD-10-CM

## 2017-10-21 DIAGNOSIS — E039 Hypothyroidism, unspecified: Secondary | ICD-10-CM | POA: Diagnosis present

## 2017-10-21 DIAGNOSIS — I10 Essential (primary) hypertension: Secondary | ICD-10-CM | POA: Insufficient documentation

## 2017-10-21 DIAGNOSIS — H538 Other visual disturbances: Secondary | ICD-10-CM | POA: Diagnosis not present

## 2017-10-21 DIAGNOSIS — H8113 Benign paroxysmal vertigo, bilateral: Principal | ICD-10-CM | POA: Insufficient documentation

## 2017-10-21 DIAGNOSIS — R0902 Hypoxemia: Secondary | ICD-10-CM | POA: Diagnosis not present

## 2017-10-21 DIAGNOSIS — H81399 Other peripheral vertigo, unspecified ear: Secondary | ICD-10-CM | POA: Diagnosis not present

## 2017-10-21 LAB — I-STAT CHEM 8, ED
BUN: 17 mg/dL (ref 8–23)
CHLORIDE: 102 mmol/L (ref 98–111)
Calcium, Ion: 1.15 mmol/L (ref 1.15–1.40)
Creatinine, Ser: 1.1 mg/dL — ABNORMAL HIGH (ref 0.44–1.00)
GLUCOSE: 144 mg/dL — AB (ref 70–99)
HCT: 38 % (ref 36.0–46.0)
Hemoglobin: 12.9 g/dL (ref 12.0–15.0)
POTASSIUM: 4 mmol/L (ref 3.5–5.1)
Sodium: 139 mmol/L (ref 135–145)
TCO2: 27 mmol/L (ref 22–32)

## 2017-10-21 MED ORDER — MECLIZINE HCL 25 MG PO TABS: 25.0000 mg | ORAL_TABLET | Freq: Three times a day (TID) | ORAL | 0 refills | Status: DC | PRN

## 2017-10-21 MED ORDER — LORAZEPAM 1 MG PO TABS
1.0000 mg | ORAL_TABLET | Freq: Once | ORAL | Status: AC
  Administered 2017-10-21: 1 mg via ORAL
  Filled 2017-10-21: qty 1

## 2017-10-21 MED ORDER — ONDANSETRON 4 MG PO TBDP
8.0000 mg | ORAL_TABLET | Freq: Once | ORAL | Status: AC
  Administered 2017-10-21: 8 mg via ORAL
  Filled 2017-10-21: qty 2

## 2017-10-21 MED ORDER — ONDANSETRON HCL 4 MG/2ML IJ SOLN
4.0000 mg | Freq: Once | INTRAMUSCULAR | Status: AC
  Administered 2017-10-22: 4 mg via INTRAVENOUS
  Filled 2017-10-21: qty 2

## 2017-10-21 MED ORDER — MECLIZINE HCL 25 MG PO TABS
25.0000 mg | ORAL_TABLET | Freq: Once | ORAL | Status: AC
  Administered 2017-10-21: 25 mg via ORAL
  Filled 2017-10-21: qty 1

## 2017-10-21 NOTE — ED Triage Notes (Signed)
Pt to ED via [facility] with c/o dizziness.  Pt st's she woke up this am feeling dizzy.  St's has gotten worse throughout the day.  Pt has nausea and vomiting with movement.  Pt denies any headache, st's her head is spinning.  Pt also st's she was dx with Vertigo several years ago

## 2017-10-21 NOTE — ED Provider Notes (Signed)
[facility] EMERGENCY DEPARTMENT Provider Note   CSN: [id number] Arrival date & time: 10/21/17  1708     History   Chief Complaint Chief Complaint  Patient presents with  . Dizziness  . Emesis    HPI Becky Mccall is a 84 y.o. female.  HPI 84 year old female presents the emergency department with complaints of dizziness described as a sensation of nausea and things moving when she rapidly turns her head to the left or right.  Her symptoms have been persistent throughout the day.  She has had these symptoms before.  Previously she was scribed meclizine but she has not tried one today.  She denies weakness of her arms or legs.  No slurred speech.  No significant headache.  Denies recent injury or trauma.  No chest pain or shortness of breath.  No abdominal pain.  No other complaints.  She feels nauseated in the room when she turns her head rapidly to one side.   Past Medical History:  Diagnosis Date  . Brain aneurysm   . CAD in native artery    a.  [facility] 11/15/16 showing normal LVEDP, mild nonobstructive CAD (30% prox RCA, 40% ostial RPDA, 45% D1, 45% dLAD), severe AS with mean gradient 40.53mmHg, AVA 0.76cm2.   . Carotid stenosis    a. 1-39% by duplex 2018.  Marland Kitchen Cervical spondylolysis   . Chronic lower back pain   . CKD (chronic kidney disease), stage II   . COPD (chronic obstructive pulmonary disease) ([place])    "mild" (12/09/2016)  . DDD (degenerative disc disease), lumbar   . GERD (gastroesophageal reflux disease)   . Hard of hearing    wears hearing aides both ears  . Hyperlipidemia    can't take the meds so takes Fish Oil  . Hypertension   . Hypothyroidism   . IBS (irritable bowel syndrome)   . Intermittent dysphagia    for pills and solids  . Osteopenia   . S/P TAVR (transcatheter aortic valve replacement) 12/07/2016   26 mm Edwards Sapien 3 transcatheter heart valve placed via open left transcarotid approach   . Severe aortic stenosis    a.  s/p TAVR 11/2016.  Marland Kitchen Vertigo     Patient Active Problem List   Diagnosis Date Noted  . PVC's (premature ventricular contractions) 10/11/2017  . Leg cramps 10/11/2017  . Essential hypertension 10/10/2017  . Chronic pain of right knee 03/17/2017  . Unilateral primary osteoarthritis, left knee 03/17/2017  . Unilateral primary osteoarthritis, right knee 03/17/2017  . Chronic pain of left knee 02/07/2017  . S/P TAVR (transcatheter aortic valve replacement) 12/07/2016  . Severe aortic stenosis 12/07/2016  . Fibrocystic breast disease   . IBS (irritable bowel syndrome)   . Cervical spondylolysis   . Osteopenia   . Vitamin D deficiency   . DDD (degenerative disc disease), lumbar   . Squamous cell skin cancer   . Carotid stenosis   . OSA (obstructive sleep apnea)   . Colon polyp   . Dizziness 05/04/2013  . Hard of hearing   . Hyperlipidemia   . COPD (chronic obstructive pulmonary disease) ([place])   . Hypothyroidism   . GERD (gastroesophageal reflux disease)   . Spinal stenosis in cervical region 02/19/2013    Past Surgical History:  Procedure Laterality Date  . ABDOMINAL HYSTERECTOMY    . APPENDECTOMY    . BACK SURGERY  2009   "on her lower back; not sure what they did" (12/09/2016)  . BLEPHAROPLASTY  Bilateral   . CARDIAC CATHETERIZATION    . CARDIAC VALVE REPLACEMENT  12/07/2016   Edwards Sapien 3 THV (size 26 mm, model # 9600TFX, serial # [id number]  . CATARACT EXTRACTION W/ INTRAOCULAR LENS  IMPLANT, BILATERAL  1999,2000  . CATARACT EXTRACTION, BILATERAL Bilateral   . COLONOSCOPY W/ BIOPSIES AND POLYPECTOMY  11/2003   Archie Endo 07/08/2010  . EYE SURGERY    . HEMORROIDECTOMY    . LUMBAR LAMINECTOMY  1999  . LUMBAR LAMINECTOMY/DECOMPRESSION MICRODISCECTOMY  09/1999   Archie Endo 07/08/2010   . POSTERIOR FUSION LUMBAR SPINE  2014  . RIGHT/LEFT HEART CATH AND CORONARY ANGIOGRAPHY N/A 11/15/2016   Procedure: RIGHT/LEFT HEART CATH AND CORONARY ANGIOGRAPHY;  Surgeon: [name], MD;   Location: [facility];  Service: Cardiovascular;  Laterality: N/A;  . SKIN GRAFT SPLIT THICKNESS LEG / FOOT Left 09/2000   Archie Endo 07/08/2010  . TEE WITHOUT CARDIOVERSION N/A 12/07/2016   Procedure: TRANSESOPHAGEAL ECHOCARDIOGRAM (TEE);  Surgeon: [name], MD;  Location: [facility];  Service: Open Heart Surgery;  Laterality: N/A;  . TONSILLECTOMY    . TRANSCATHETER AORTIC VALVE REPLACEMENT, TRANSFEMORAL  12/07/2016   Edwards Sapien 3 THV (size 26 mm, model # 9600TFX, serial # [id number]     OB History   None      Home Medications    Prior to Admission medications   Medication Sig Start Date End Date Taking? Authorizing Provider  amLODipine (NORVASC) 2.5 MG tablet Take 2.5 mg by mouth daily.    [provider]  aspirin EC 81 MG tablet Take 1 tablet (81 mg total) by mouth daily. 11/10/16   Dunn, Nedra Hai, PA-C  Calcium Carb-Cholecalciferol (CALCIUM 1000 + D PO) Take 1 tablet by mouth daily.     [provider]  Cholecalciferol (VITAMIN D) 2000 UNITS tablet Take 6,000 Units by mouth daily.     [provider]  Cinnamon 500 MG capsule Take 500 mg by mouth 2 (two) times daily.     [provider]  levothyroxine (SYNTHROID, LEVOTHROID) 137 MCG tablet Take 137 mcg by mouth daily before breakfast.    [provider]  meclizine (ANTIVERT) 25 MG tablet Take 12.5-25 mg by mouth every 6 (six) hours as needed for dizziness.    [provider]  Multiple Vitamin (MULTIVITAMIN WITH MINERALS) TABS tablet Take 1 tablet by mouth daily.    [provider]  Omega-3 Fatty Acids (FISH OIL) 1000 MG CAPS Take 1,000 mg by mouth 2 (two) times daily.    [provider]  traMADol (ULTRAM) 50 MG tablet Take 1 tablet (50 mg total) by mouth every 8 (eight) hours as needed (pain not relieved by tylenol). 05/05/13   Barton Dubois, MD  Turmeric 500 MG CAPS Take 500 mg by mouth 2 (two) times daily.    [provider]  vitamin B-12  (CYANOCOBALAMIN) 1000 MCG tablet Take 1,000 mcg by mouth daily.    [provider]  vitamin C (ASCORBIC ACID) 500 MG tablet Take 500 mg by mouth daily.    [provider]  vitamin E 400 UNIT capsule Take 400 Units by mouth 2 (two) times daily.     [provider]    Family History Family History  Problem Relation Age of Onset  . Cancer - Lung Mother   . Heart attack Father   . Cancer - Lung Brother   . Cancer - Lung Maternal Aunt     Social History Social History  Tobacco Use  . Smoking status: Former Smoker    Packs/day: 2.00    Years: 40.00    Pack years: 80.00    Types: Cigarettes    Last attempt to quit: 08/02/1989    Years since quitting: 28.2  . Smokeless tobacco: Never Used  Substance Use Topics  . Alcohol use: Yes    Alcohol/week: 14.0 standard drinks    Types: 7 Standard drinks or equivalent, 7 Glasses of wine per week  . Drug use: No     Allergies   Lisinopril; Relafen [nabumetone]; Statins; Tape; Welchol [colesevelam]; Zetia [ezetimibe]; Neosporin [neomycin-bacitracin zn-polymyx]; and Zinc oxide   Review of Systems Review of Systems  All other systems reviewed and are negative.    Physical Exam Updated Vital Signs BP (!) 170/67   Pulse 79   Temp 98.2 F (36.8 C)   Resp 14   Ht 5\' 6"  (1.676 m)   Wt 74.8 kg   LMP  (LMP Unknown)   SpO2 95%   BMI 26.63 kg/m   Physical Exam  Constitutional: She is oriented to person, place, and time. She appears well-developed and well-nourished. No distress.  HENT:  Head: Normocephalic and atraumatic.  Eyes: Pupils are equal, round, and reactive to light. EOM are normal.  Neck: Normal range of motion.  Cardiovascular: Normal rate, regular rhythm and normal heart sounds.  Pulmonary/Chest: Effort normal and breath sounds normal.  Abdominal: Soft. She exhibits no distension. There is no tenderness.  Musculoskeletal: Normal range of motion.  Neurological: She is alert and oriented to  person, place, and time.  5/5 strength in major muscle groups of  bilateral upper and lower extremities. Speech normal. No facial asymetry.   Skin: Skin is warm and dry.  Psychiatric: She has a normal mood and affect. Judgment normal.  Nursing note and vitals reviewed.    ED Treatments / Results  Labs (all labs ordered are listed, but only abnormal results are displayed) Labs Reviewed  I-STAT CHEM 8, ED - Abnormal; Notable for the following components:      Result Value   Creatinine, Ser 1.10 (*)    Glucose, Bld 144 (*)    All other components within normal limits    EKG EKG Interpretation  Date/Time:  Friday October 21 2017 17:16:08 EDT Ventricular Rate:  81 PR Interval:    QRS Duration: 104 QT Interval:  431 QTC Calculation: 501 R Axis:   -25 Text Interpretation:  Sinus rhythm Prolonged PR interval Borderline left axis deviation Abnormal R-wave progression, early transition Prolonged QT interval No significant change was found Confirmed by Jola Schmidt [phone]) on 10/21/2017 7:27:38 PM   Radiology No results found.  Procedures Procedures (including critical care time)  Medications Ordered in ED Medications  meclizine (ANTIVERT) tablet 25 mg (25 mg Oral Given 10/21/17 1832)  ondansetron (ZOFRAN-ODT) disintegrating tablet 8 mg (8 mg Oral Given 10/21/17 1832)     Initial Impression / Assessment and Plan / ED Course  I have reviewed the triage vital signs and the nursing notes.  Pertinent labs & imaging results that were available during my care of the patient were reviewed by me and considered in my medical decision making (see chart for details).     Symptoms most consistent with peripheral vertigo.  Doubt central process.  No unilateral arm or leg weakness.  Speech is normal.  Patient will be tried on meclizine at this time.  Will reevaluate after she is symptomatically feeling better.  Will monitor closely while  in the emergency department.  12:22 AM Still unable  to walk.  Patient will undergo MRI MRA brain at this time to evaluate for central process.  Basic labs.  Ongoing symptomatic management in the emergency department.  Care to Dr Betsey Holiday  Final Clinical Impressions(s) / ED Diagnoses   Final diagnoses:  Peripheral vertigo, unspecified laterality    ED Discharge Orders    None       Jola Schmidt, MD 10/22/17 [phone]

## 2017-10-21 NOTE — ED Notes (Signed)
Attempted to ambulate patient. Patient was able to sit up on the side of the bed, stand up, and take two steps, but she had to return back to bed because she "didn't feel right". Patient was scanning the room with her eyes while standing to try to keep her balance. She stated that she felt lightheaded while standing.

## 2017-10-22 ENCOUNTER — Emergency Department (HOSPITAL_COMMUNITY): Payer: Medicare Other

## 2017-10-22 ENCOUNTER — Encounter (HOSPITAL_COMMUNITY): Payer: Self-pay | Admitting: Radiology

## 2017-10-22 ENCOUNTER — Other Ambulatory Visit: Payer: Self-pay

## 2017-10-22 DIAGNOSIS — I10 Essential (primary) hypertension: Secondary | ICD-10-CM

## 2017-10-22 DIAGNOSIS — E039 Hypothyroidism, unspecified: Secondary | ICD-10-CM | POA: Diagnosis not present

## 2017-10-22 DIAGNOSIS — R42 Dizziness and giddiness: Secondary | ICD-10-CM

## 2017-10-22 DIAGNOSIS — I6523 Occlusion and stenosis of bilateral carotid arteries: Secondary | ICD-10-CM | POA: Diagnosis not present

## 2017-10-22 LAB — BASIC METABOLIC PANEL
Anion gap: 11 (ref 5–15)
BUN: 13 mg/dL (ref 8–23)
CALCIUM: 9 mg/dL (ref 8.9–10.3)
CO2: 25 mmol/L (ref 22–32)
CREATININE: 1.06 mg/dL — AB (ref 0.44–1.00)
Chloride: 104 mmol/L (ref 98–111)
GFR, EST AFRICAN AMERICAN: 54 mL/min — AB (ref >60)
GFR, EST NON AFRICAN AMERICAN: 47 mL/min — AB (ref >60)
Glucose, Bld: 162 mg/dL — ABNORMAL HIGH (ref 70–99)
Potassium: 4.2 mmol/L (ref 3.5–5.1)
Sodium: 140 mmol/L (ref 135–145)

## 2017-10-22 LAB — CBC
HEMATOCRIT: 42.7 % (ref 36.0–46.0)
Hemoglobin: 13.5 g/dL (ref 12.0–15.0)
MCH: 29.8 pg (ref 26.0–34.0)
MCHC: 31.6 g/dL (ref 30.0–36.0)
MCV: 94.3 fL (ref 78.0–100.0)
Platelets: 174 10*3/uL (ref 150–400)
RBC: 4.53 MIL/uL (ref 3.87–5.11)
RDW: 13.9 % (ref 11.5–15.5)
WBC: 6 10*3/uL (ref 4.0–10.5)

## 2017-10-22 MED ORDER — ACETAMINOPHEN 650 MG RE SUPP: 650.0000 mg | Freq: Four times a day (QID) | RECTAL | Status: DC | PRN

## 2017-10-22 MED ORDER — TRAMADOL HCL 50 MG PO TABS: 50.0000 mg | ORAL_TABLET | Freq: Three times a day (TID) | ORAL | Status: DC | PRN

## 2017-10-22 MED ORDER — SODIUM CHLORIDE 0.9% FLUSH
3.0000 mL | Freq: Two times a day (BID) | INTRAVENOUS | Status: DC
  Administered 2017-10-22 (×2): 3 mL via INTRAVENOUS

## 2017-10-22 MED ORDER — VITAMIN B-12 1000 MCG PO TABS
1000.0000 ug | ORAL_TABLET | Freq: Every day | ORAL | Status: DC
  Administered 2017-10-22 – 2017-10-23 (×2): 1000 ug via ORAL
  Filled 2017-10-22 (×2): qty 1

## 2017-10-22 MED ORDER — MECLIZINE HCL 25 MG PO TABS: 25.0000 mg | ORAL_TABLET | Freq: Three times a day (TID) | ORAL | Status: DC | PRN

## 2017-10-22 MED ORDER — SODIUM CHLORIDE 0.9 % IV SOLN: 250.0000 mL | INTRAVENOUS | Status: DC | PRN

## 2017-10-22 MED ORDER — LEVOTHYROXINE SODIUM 137 MCG PO TABS
137.0000 ug | ORAL_TABLET | Freq: Every day | ORAL | Status: DC
  Administered 2017-10-22 – 2017-10-23 (×2): 137 ug via ORAL
  Filled 2017-10-22 (×2): qty 1

## 2017-10-22 MED ORDER — DIAZEPAM 2 MG PO TABS: 2.0000 mg | ORAL_TABLET | Freq: Four times a day (QID) | ORAL | Status: DC | PRN

## 2017-10-22 MED ORDER — LORAZEPAM 2 MG/ML IJ SOLN: 0.5000 mg | Freq: Four times a day (QID) | INTRAMUSCULAR | Status: DC | PRN

## 2017-10-22 MED ORDER — MECLIZINE HCL 25 MG PO TABS
25.0000 mg | ORAL_TABLET | Freq: Three times a day (TID) | ORAL | Status: DC
  Administered 2017-10-22 – 2017-10-23 (×4): 25 mg via ORAL
  Filled 2017-10-22 (×2): qty 1
  Filled 2017-10-22 (×2): qty 2
  Filled 2017-10-22: qty 1
  Filled 2017-10-22: qty 2
  Filled 2017-10-22 (×2): qty 1

## 2017-10-22 MED ORDER — MECLIZINE HCL 25 MG PO TABS
25.0000 mg | ORAL_TABLET | Freq: Once | ORAL | Status: AC
  Administered 2017-10-22: 25 mg via ORAL
  Filled 2017-10-22: qty 1

## 2017-10-22 MED ORDER — ASPIRIN EC 81 MG PO TBEC
81.0000 mg | DELAYED_RELEASE_TABLET | Freq: Every day | ORAL | Status: DC
  Administered 2017-10-22 – 2017-10-23 (×2): 81 mg via ORAL
  Filled 2017-10-22 (×3): qty 1

## 2017-10-22 MED ORDER — ONDANSETRON HCL 4 MG PO TABS: 4.0000 mg | ORAL_TABLET | Freq: Four times a day (QID) | ORAL | Status: DC | PRN

## 2017-10-22 MED ORDER — HEPARIN SODIUM (PORCINE) 5000 UNIT/ML IJ SOLN
5000.0000 [IU] | Freq: Three times a day (TID) | INTRAMUSCULAR | Status: DC
  Administered 2017-10-22 – 2017-10-23 (×4): 5000 [IU] via SUBCUTANEOUS
  Filled 2017-10-22 (×4): qty 1

## 2017-10-22 MED ORDER — ONDANSETRON HCL 4 MG/2ML IJ SOLN: 4.0000 mg | Freq: Four times a day (QID) | INTRAMUSCULAR | Status: DC | PRN

## 2017-10-22 MED ORDER — GADOBENATE DIMEGLUMINE 529 MG/ML IV SOLN
15.0000 mL | Freq: Once | INTRAVENOUS | Status: AC | PRN
  Administered 2017-10-22: 15 mL via INTRAVENOUS

## 2017-10-22 MED ORDER — ACETAMINOPHEN 325 MG PO TABS: 650.0000 mg | ORAL_TABLET | Freq: Four times a day (QID) | ORAL | Status: DC | PRN

## 2017-10-22 MED ORDER — AMLODIPINE BESYLATE 5 MG PO TABS
2.5000 mg | ORAL_TABLET | Freq: Every day | ORAL | Status: DC
  Administered 2017-10-22 – 2017-10-23 (×2): 2.5 mg via ORAL
  Filled 2017-10-22 (×2): qty 1

## 2017-10-22 MED ORDER — SODIUM CHLORIDE 0.9% FLUSH: 3.0000 mL | INTRAVENOUS | Status: DC | PRN

## 2017-10-22 NOTE — Progress Notes (Signed)
Pt arrived to unit, aox3. MD notified

## 2017-10-22 NOTE — ED Notes (Signed)
Pt returned from MRI, denies pain

## 2017-10-22 NOTE — H&P (Signed)
History and Physical    MYIA BERGH POE:[id number] DOB: 05/18/1929 DOA: 10/21/2017  PCP: [name], MD   Patient coming from: Home   Chief Complaint: Sensation of room spinning, N/V   HPI: Becky Mccall is a 84 y.o. female with medical history significant for coronary artery disease, hypothyroidism, and hypertension, now presenting to the emergency department with dizziness, nausea, and nonbloody vomiting.  Patient reports that she was in her usual state until yesterday when she developed mild dizziness and nausea.  This worsened acutely yesterday afternoon.  She describes a sensation as though the room is spinning, double vision, and constant nausea with nonbloody vomiting.  She reports experiencing similar symptoms couple years ago and was diagnosed with vertigo at that time.  She denies any headache, change in hearing, or focal numbness or weakness.  No recent fevers, chills, chest pain, shortness breath, or cough.  No recent fall or trauma.  No alcohol or illicit substance use.  ED Course: Upon arrival to the ED, patient is found to be afebrile, saturating well on room air, and with vitals otherwise stable.  EKG features a sinus rhythm with first-degree AV nodal block.  Chemistry panel features a mild stable renal insufficiency and CBC is normal.  Patient was treated with meclizine, Zofran, and Ativan in the ED.  She underwent MRI brain and MRA head and neck with no acute findings.  She was given additional medications, but remains quite symptomatic and unable to ambulate without two-person assist.  ED physician discussed the case with neurology who reviewed the imaging independently and advises against any further imaging at this time.  Patient will be observed for ongoing evaluation and management.  Review of Systems:  All other systems reviewed and apart from HPI, are negative.  Past Medical History:  Diagnosis Date  . Brain aneurysm   . CAD in native artery    a.  [facility]  11/15/16 showing normal LVEDP, mild nonobstructive CAD (30% prox RCA, 40% ostial RPDA, 45% D1, 45% dLAD), severe AS with mean gradient 40.80mmHg, AVA 0.76cm2.   . Carotid stenosis    a. 1-39% by duplex 2018.  Marland Kitchen Cervical spondylolysis   . Chronic lower back pain   . CKD (chronic kidney disease), stage II   . COPD (chronic obstructive pulmonary disease) ([place])    "mild" (12/09/2016)  . DDD (degenerative disc disease), lumbar   . GERD (gastroesophageal reflux disease)   . Hard of hearing    wears hearing aides both ears  . Hyperlipidemia    can't take the meds so takes Fish Oil  . Hypertension   . Hypothyroidism   . IBS (irritable bowel syndrome)   . Intermittent dysphagia    for pills and solids  . Osteopenia   . S/P TAVR (transcatheter aortic valve replacement) 12/07/2016   26 mm Edwards Sapien 3 transcatheter heart valve placed via open left transcarotid approach   . Severe aortic stenosis    a. s/p TAVR 11/2016.  Marland Kitchen Vertigo     Past Surgical History:  Procedure Laterality Date  . ABDOMINAL HYSTERECTOMY    . APPENDECTOMY    . BACK SURGERY  2009   "on her lower back; not sure what they did" (12/09/2016)  . BLEPHAROPLASTY Bilateral   . CARDIAC CATHETERIZATION    . CARDIAC VALVE REPLACEMENT  12/07/2016   Edwards Sapien 3 THV (size 26 mm, model # 9600TFX, serial # [id number]  . CATARACT EXTRACTION W/ INTRAOCULAR LENS  IMPLANT, BILATERAL  1999,2000  .  CATARACT EXTRACTION, BILATERAL Bilateral   . COLONOSCOPY W/ BIOPSIES AND POLYPECTOMY  11/2003   Archie Endo 07/08/2010  . EYE SURGERY    . HEMORROIDECTOMY    . LUMBAR LAMINECTOMY  1999  . LUMBAR LAMINECTOMY/DECOMPRESSION MICRODISCECTOMY  09/1999   Archie Endo 07/08/2010   . POSTERIOR FUSION LUMBAR SPINE  2014  . RIGHT/LEFT HEART CATH AND CORONARY ANGIOGRAPHY N/A 11/15/2016   Procedure: RIGHT/LEFT HEART CATH AND CORONARY ANGIOGRAPHY;  Surgeon: [name], MD;  Location: [facility];  Service: Cardiovascular;  Laterality: N/A;  . SKIN  GRAFT SPLIT THICKNESS LEG / FOOT Left 09/2000   Archie Endo 07/08/2010  . TEE WITHOUT CARDIOVERSION N/A 12/07/2016   Procedure: TRANSESOPHAGEAL ECHOCARDIOGRAM (TEE);  Surgeon: [name], MD;  Location: [facility];  Service: Open Heart Surgery;  Laterality: N/A;  . TONSILLECTOMY    . TRANSCATHETER AORTIC VALVE REPLACEMENT, TRANSFEMORAL  12/07/2016   Edwards Sapien 3 THV (size 26 mm, model # 9600TFX, serial # [id number]     reports that she quit smoking about 28 years ago. Her smoking use included cigarettes. She has a 80.00 pack-year smoking history. She has never used smokeless tobacco. She reports that she drinks about 14.0 standard drinks of alcohol per week. She reports that she does not use drugs.  Allergies  Allergen Reactions  . Lisinopril Cough  . Relafen [Nabumetone] Other (See Comments)    Unsure of exact reaction.  . Statins     Muscle pain  . Tape     Developed blisters from clear tape used over cath site 10/2016  . Welchol [Colesevelam] Other (See Comments)    Muscle pain  . Zetia [Ezetimibe] Other (See Comments)    Muscle pain.  . Neosporin [Neomycin-Bacitracin Zn-Polymyx] Other (See Comments)    Cause wound/scratch to worsen  . Zinc Oxide Rash    Family History  Problem Relation Age of Onset  . Cancer - Lung Mother   . Heart attack Father   . Cancer - Lung Brother   . Cancer - Lung Maternal Aunt      Prior to Admission medications   Medication Sig Start Date End Date Taking? Authorizing Provider  amLODipine (NORVASC) 2.5 MG tablet Take 2.5 mg by mouth daily.    [provider]  aspirin EC 81 MG tablet Take 1 tablet (81 mg total) by mouth daily. 11/10/16   Dunn, Nedra Hai, PA-C  Calcium Carb-Cholecalciferol (CALCIUM 1000 + D PO) Take 1 tablet by mouth daily.     [provider]  Cholecalciferol (VITAMIN D) 2000 UNITS tablet Take 6,000 Units by mouth daily.     [provider]  Cinnamon 500 MG capsule Take 500 mg by mouth 2 (two) times daily.      [provider]  levothyroxine (SYNTHROID, LEVOTHROID) 137 MCG tablet Take 137 mcg by mouth daily before breakfast.    [provider]  meclizine (ANTIVERT) 25 MG tablet Take 1 tablet (25 mg total) by mouth 3 (three) times daily as needed for dizziness. 10/21/17   Jola Schmidt, MD  Multiple Vitamin (MULTIVITAMIN WITH MINERALS) TABS tablet Take 1 tablet by mouth daily.    [provider]  Omega-3 Fatty Acids (FISH OIL) 1000 MG CAPS Take 1,000 mg by mouth 2 (two) times daily.    [provider]  traMADol (ULTRAM) 50 MG tablet Take 1 tablet (50 mg total) by mouth every 8 (eight) hours as needed (pain not relieved by tylenol). 05/05/13   Barton Dubois, MD  Turmeric 500  MG CAPS Take 500 mg by mouth 2 (two) times daily.    [provider]  vitamin B-12 (CYANOCOBALAMIN) 1000 MCG tablet Take 1,000 mcg by mouth daily.    [provider]  vitamin C (ASCORBIC ACID) 500 MG tablet Take 500 mg by mouth daily.    [provider]  vitamin E 400 UNIT capsule Take 400 Units by mouth 2 (two) times daily.     [provider]    Physical Exam: Vitals:   10/22/17 0315 10/22/17 0330 10/22/17 0345 10/22/17 0400  BP: (!) 160/58 (!) 150/57 (!) 147/63 (!) 144/82  Pulse: 80 79 78 78  Resp: 16 16 15 17   Temp:      TempSrc:      SpO2: 98% 96% 98% 97%  Weight:      Height:          Constitutional: NAD, calm, appears uncomfortable Eyes: PERTLA, lids and conjunctivae normal ENMT: Mucous membranes are moist. Posterior pharynx clear of any exudate or lesions.   Neck: normal, supple, no masses, no thyromegaly Respiratory: clear to auscultation bilaterally, no wheezing, no crackles. Normal respiratory effort.  Cardiovascular: S1 & S2 heard, regular rate and rhythm. No extremity edema.  Abdomen: No distension, no tenderness, no masses palpated. Bowel sounds normal.  Musculoskeletal: no clubbing / cyanosis. No joint deformity upper and lower  extremities.    Skin: no significant rashes, lesions, ulcers. Warm, dry, well-perfused. Neurologic: No facial asymmetry. Gross hearing deficit. Patellar DTRs normal. Strength 5/5 in all 4 limbs.  Psychiatric: Alert and oriented x 3. Calm, cooperative.     Labs on Admission: I have personally reviewed following labs and imaging studies  CBC: Recent Labs  Lab 10/21/17 1920 10/21/17 2351  WBC  --  6.0  HGB 12.9 13.5  HCT 38.0 42.7  MCV  --  94.3  PLT  --  937   Basic Metabolic Panel: Recent Labs  Lab 10/21/17 1920 10/21/17 2351  NA 139 140  K 4.0 4.2  CL 102 104  CO2  --  25  GLUCOSE 144* 162*  BUN 17 13  CREATININE 1.10* 1.06*  CALCIUM  --  9.0   GFR: Estimated Creatinine Clearance: 40.1 mL/min (A) (by C-G formula based on SCr of 1.06 mg/dL (H)). Liver Function Tests: No results for input(s): AST, ALT, ALKPHOS, BILITOT, PROT, ALBUMIN in the last 168 hours. No results for input(s): LIPASE, AMYLASE in the last 168 hours. No results for input(s): AMMONIA in the last 168 hours. Coagulation Profile: No results for input(s): INR, PROTIME in the last 168 hours. Cardiac Enzymes: No results for input(s): CKTOTAL, CKMB, CKMBINDEX, TROPONINI in the last 168 hours. BNP (last 3 results) Recent Labs    11/10/16 1544  PROBNP 379   HbA1C: No results for input(s): HGBA1C in the last 72 hours. CBG: No results for input(s): GLUCAP in the last 168 hours. Lipid Profile: No results for input(s): CHOL, HDL, LDLCALC, TRIG, CHOLHDL, LDLDIRECT in the last 72 hours. Thyroid Function Tests: No results for input(s): TSH, T4TOTAL, FREET4, T3FREE, THYROIDAB in the last 72 hours. Anemia Panel: No results for input(s): VITAMINB12, FOLATE, FERRITIN, TIBC, IRON, RETICCTPCT in the last 72 hours. Urine analysis:    Component Value Date/Time   COLORURINE YELLOW 12/09/2016 1717   APPEARANCEUR HAZY (A) 12/09/2016 1717   LABSPEC 1.023 12/09/2016 1717   PHURINE 5.0 12/09/2016 1717   GLUCOSEU  NEGATIVE 12/09/2016 1717   HGBUR NEGATIVE 12/09/2016 [place] 12/09/2016 1717  KETONESUR NEGATIVE 12/09/2016 1717   PROTEINUR NEGATIVE 12/09/2016 1717   UROBILINOGEN 0.2 05/04/2013 0328   NITRITE NEGATIVE 12/09/2016 1717   LEUKOCYTESUR NEGATIVE 12/09/2016 1717   Sepsis Labs: @LABRCNTIP (procalcitonin:4,lacticidven:4) )No results found for this or any previous visit (from the past 240 hour(s)).   Radiological Exams on Admission: Mr Virgel Paling IR Contrast  Result Date: 10/22/2017 CLINICAL DATA:  Dizziness, nausea worse with head movement. History of brain aneurysm, hypertension, hyperlipidemia. EXAM: MRI HEAD WITHOUT CONTRAST MRA HEAD WITHOUT CONTRAST MRA NECK WITHOUT AND WITH CONTRAST TECHNIQUE: Multiplanar, multiecho pulse sequences of the brain and surrounding structures were obtained without intravenous contrast. Coronal T2 sequences not obtained. Angiographic images of the Circle of Willis were obtained using MRA technique without intravenous contrast. Angiographic images of the neck were obtained using MRA technique without and with intravenous contrast. Carotid stenosis measurements (when applicable) are obtained utilizing NASCET criteria, using the distal internal carotid diameter as the adenomatous CONTRAST:  65mL MULTIHANCE GADOBENATE DIMEGLUMINE 529 MG/ML IV SOLN COMPARISON:  MRI cervical spine May 05, 2017 and CT HEAD December 09, 2016 and MRA/MRI head May 04, 2013. FINDINGS: MRI HEAD FINDINGS INTRACRANIAL CONTENTS: No reduced diffusion to suggest acute ischemia. No susceptibility artifact to suggest hemorrhage. The ventricles and sulci are normal for patient's age. Patchy supratentorial pontine white matter FLAIR T2 hyperintensities. No suspicious parenchymal signal, masses, mass effect. No abnormal extra-axial fluid collections. No extra-axial masses. VASCULAR: Normal major intracranial vascular flow voids present at skull base. SKULL AND UPPER CERVICAL SPINE: No  abnormal sellar expansion. No suspicious calvarial bone marrow signal. Craniocervical junction maintained. SINUSES/ORBITS: The mastoid air-cells and included paranasal sinuses are well-aerated.The included ocular globes and orbital contents are non-suspicious. Status post bilateral ocular lens implants. OTHER: Patient is edentulous. MRA HEAD moderately motion degraded examination. ANTERIOR CIRCULATION: Normal flow related enhancement of the included cervical, petrous, cavernous and supraclinoid internal carotid arteries. Similar 2 mm laterally directed aneurysm LEFT cavernous ICA and 3 mm inferiorly directed aneurysm LEFT PCOM origin. Patent anterior communicating artery. Patent anterior and middle cerebral arteries. Mild stenosis bilateral M1 segments. No large vessel occlusion, flow limiting stenosis. POSTERIOR CIRCULATION: Codominant vertebral arteries. Vertebrobasilar arteries are patent, with normal flow related enhancement of the main branch vessels. Patent posterior cerebral arteries. Moderate stenosis LEFT P3 segment. No large vessel occlusion, flow limiting stenosis,  aneurysm. ANATOMIC VARIANTS: None. Source images and MIP images were reviewed. MRA NECK FINDINGS ANTERIOR CIRCULATION: The common carotid arteries are widely patent bilaterally. The carotid bifurcations are patent bilaterally without hemodynamically significant stenosis by NASCET criteria. No flow limiting stenosis or luminal irregularity. POSTERIOR CIRCULATION: Bilateral vertebral arteries are patent to the vertebrobasilar junction. Mild stenosis LEFT vertebral artery origin. No flow limiting stenosis or luminal irregularity. Source images and MIP images were reviewed. IMPRESSION: MRI HEAD: 1. No acute intracranial process. 2. Mild-to-moderate chronic small vessel ischemic changes. MRA HEAD: 1. Moderately motion degraded examination. No emergent large vessel occlusion or flow-limiting stenosis. 2. Stable appearance of 2 mm LEFT cavernous ICA  and 3 mm LEFT PCOM origin aneurysms. 3. Mild stenosis bilateral M1 segments. MRA NECK: 1. No hemodynamically significant stenosis ICA's. 2. Mild stenosis LEFT vertebral artery origin. Patent bilateral vertebral arteries. Electronically Signed   By: Elon Alas M.D.   On: 10/22/2017 02:26   Mr Angiogram Neck W Or Wo Contrast  Result Date: 10/22/2017 CLINICAL DATA:  Dizziness, nausea worse with head movement. History of brain aneurysm, hypertension, hyperlipidemia. EXAM: MRI HEAD WITHOUT CONTRAST MRA HEAD WITHOUT CONTRAST MRA NECK  WITHOUT AND WITH CONTRAST TECHNIQUE: Multiplanar, multiecho pulse sequences of the brain and surrounding structures were obtained without intravenous contrast. Coronal T2 sequences not obtained. Angiographic images of the Circle of Willis were obtained using MRA technique without intravenous contrast. Angiographic images of the neck were obtained using MRA technique without and with intravenous contrast. Carotid stenosis measurements (when applicable) are obtained utilizing NASCET criteria, using the distal internal carotid diameter as the adenomatous CONTRAST:  89mL MULTIHANCE GADOBENATE DIMEGLUMINE 529 MG/ML IV SOLN COMPARISON:  MRI cervical spine May 05, 2017 and CT HEAD December 09, 2016 and MRA/MRI head May 04, 2013. FINDINGS: MRI HEAD FINDINGS INTRACRANIAL CONTENTS: No reduced diffusion to suggest acute ischemia. No susceptibility artifact to suggest hemorrhage. The ventricles and sulci are normal for patient's age. Patchy supratentorial pontine white matter FLAIR T2 hyperintensities. No suspicious parenchymal signal, masses, mass effect. No abnormal extra-axial fluid collections. No extra-axial masses. VASCULAR: Normal major intracranial vascular flow voids present at skull base. SKULL AND UPPER CERVICAL SPINE: No abnormal sellar expansion. No suspicious calvarial bone marrow signal. Craniocervical junction maintained. SINUSES/ORBITS: The mastoid air-cells and included  paranasal sinuses are well-aerated.The included ocular globes and orbital contents are non-suspicious. Status post bilateral ocular lens implants. OTHER: Patient is edentulous. MRA HEAD moderately motion degraded examination. ANTERIOR CIRCULATION: Normal flow related enhancement of the included cervical, petrous, cavernous and supraclinoid internal carotid arteries. Similar 2 mm laterally directed aneurysm LEFT cavernous ICA and 3 mm inferiorly directed aneurysm LEFT PCOM origin. Patent anterior communicating artery. Patent anterior and middle cerebral arteries. Mild stenosis bilateral M1 segments. No large vessel occlusion, flow limiting stenosis. POSTERIOR CIRCULATION: Codominant vertebral arteries. Vertebrobasilar arteries are patent, with normal flow related enhancement of the main branch vessels. Patent posterior cerebral arteries. Moderate stenosis LEFT P3 segment. No large vessel occlusion, flow limiting stenosis,  aneurysm. ANATOMIC VARIANTS: None. Source images and MIP images were reviewed. MRA NECK FINDINGS ANTERIOR CIRCULATION: The common carotid arteries are widely patent bilaterally. The carotid bifurcations are patent bilaterally without hemodynamically significant stenosis by NASCET criteria. No flow limiting stenosis or luminal irregularity. POSTERIOR CIRCULATION: Bilateral vertebral arteries are patent to the vertebrobasilar junction. Mild stenosis LEFT vertebral artery origin. No flow limiting stenosis or luminal irregularity. Source images and MIP images were reviewed. IMPRESSION: MRI HEAD: 1. No acute intracranial process. 2. Mild-to-moderate chronic small vessel ischemic changes. MRA HEAD: 1. Moderately motion degraded examination. No emergent large vessel occlusion or flow-limiting stenosis. 2. Stable appearance of 2 mm LEFT cavernous ICA and 3 mm LEFT PCOM origin aneurysms. 3. Mild stenosis bilateral M1 segments. MRA NECK: 1. No hemodynamically significant stenosis ICA's. 2. Mild stenosis LEFT  vertebral artery origin. Patent bilateral vertebral arteries. Electronically Signed   By: Elon Alas M.D.   On: 10/22/2017 02:26   Mr Brain Wo Contrast  Result Date: 10/22/2017 CLINICAL DATA:  Dizziness, nausea worse with head movement. History of brain aneurysm, hypertension, hyperlipidemia. EXAM: MRI HEAD WITHOUT CONTRAST MRA HEAD WITHOUT CONTRAST MRA NECK WITHOUT AND WITH CONTRAST TECHNIQUE: Multiplanar, multiecho pulse sequences of the brain and surrounding structures were obtained without intravenous contrast. Coronal T2 sequences not obtained. Angiographic images of the Circle of Willis were obtained using MRA technique without intravenous contrast. Angiographic images of the neck were obtained using MRA technique without and with intravenous contrast. Carotid stenosis measurements (when applicable) are obtained utilizing NASCET criteria, using the distal internal carotid diameter as the adenomatous CONTRAST:  42mL MULTIHANCE GADOBENATE DIMEGLUMINE 529 MG/ML IV SOLN COMPARISON:  MRI cervical spine May 05, 2017 and CT HEAD December 09, 2016 and MRA/MRI head May 04, 2013. FINDINGS: MRI HEAD FINDINGS INTRACRANIAL CONTENTS: No reduced diffusion to suggest acute ischemia. No susceptibility artifact to suggest hemorrhage. The ventricles and sulci are normal for patient's age. Patchy supratentorial pontine white matter FLAIR T2 hyperintensities. No suspicious parenchymal signal, masses, mass effect. No abnormal extra-axial fluid collections. No extra-axial masses. VASCULAR: Normal major intracranial vascular flow voids present at skull base. SKULL AND UPPER CERVICAL SPINE: No abnormal sellar expansion. No suspicious calvarial bone marrow signal. Craniocervical junction maintained. SINUSES/ORBITS: The mastoid air-cells and included paranasal sinuses are well-aerated.The included ocular globes and orbital contents are non-suspicious. Status post bilateral ocular lens implants. OTHER: Patient is  edentulous. MRA HEAD moderately motion degraded examination. ANTERIOR CIRCULATION: Normal flow related enhancement of the included cervical, petrous, cavernous and supraclinoid internal carotid arteries. Similar 2 mm laterally directed aneurysm LEFT cavernous ICA and 3 mm inferiorly directed aneurysm LEFT PCOM origin. Patent anterior communicating artery. Patent anterior and middle cerebral arteries. Mild stenosis bilateral M1 segments. No large vessel occlusion, flow limiting stenosis. POSTERIOR CIRCULATION: Codominant vertebral arteries. Vertebrobasilar arteries are patent, with normal flow related enhancement of the main branch vessels. Patent posterior cerebral arteries. Moderate stenosis LEFT P3 segment. No large vessel occlusion, flow limiting stenosis,  aneurysm. ANATOMIC VARIANTS: None. Source images and MIP images were reviewed. MRA NECK FINDINGS ANTERIOR CIRCULATION: The common carotid arteries are widely patent bilaterally. The carotid bifurcations are patent bilaterally without hemodynamically significant stenosis by NASCET criteria. No flow limiting stenosis or luminal irregularity. POSTERIOR CIRCULATION: Bilateral vertebral arteries are patent to the vertebrobasilar junction. Mild stenosis LEFT vertebral artery origin. No flow limiting stenosis or luminal irregularity. Source images and MIP images were reviewed. IMPRESSION: MRI HEAD: 1. No acute intracranial process. 2. Mild-to-moderate chronic small vessel ischemic changes. MRA HEAD: 1. Moderately motion degraded examination. No emergent large vessel occlusion or flow-limiting stenosis. 2. Stable appearance of 2 mm LEFT cavernous ICA and 3 mm LEFT PCOM origin aneurysms. 3. Mild stenosis bilateral M1 segments. MRA NECK: 1. No hemodynamically significant stenosis ICA's. 2. Mild stenosis LEFT vertebral artery origin. Patent bilateral vertebral arteries. Electronically Signed   By: Elon Alas M.D.   On: 10/22/2017 02:26    EKG: Independently  reviewed. Sinus rhythm, 1st degree AV block.   Assessment/Plan   1. Vertigo  - Presents with sensation of room spinning, nausea, and vomiting; reports hx of same  - MRI and MRA negative for acute findings  - She remains unable to ambulate without intensive assistance despite aggressive symptomatic treatment in ED  - Consult PT and OT for eval and tx, continue supportive care with meclizine, antiemetics, as needed Valium    2. Hypertension  - BP at goal  - Continue Norvasc    3. Hypothyroidism  - Continue Synthroid    DVT prophylaxis: sq heparin  Code Status: Full  Family Communication: Discussed with patient  Consults called: ED physician discussed with Neurology  Admission status: Observation     Vianne Bulls, MD [facility] Pager [phone]  If 7PM-7AM, please contact night-coverage [email or web address] Password [email or web address]  10/22/2017, 5:57 AM

## 2017-10-22 NOTE — ED Notes (Signed)
Pt placed in 2 L  d/t sustained 02 sat of 88%

## 2017-10-22 NOTE — ED Provider Notes (Signed)
Patient signed out to me to follow-up on MRI.  Patient does have a history of peripheral vertigo.  She had vertigo symptoms onset today.  She did not improve with Ativan or meclizine.  She therefore underwent MRI brain, MRA brain, MRA neck.  No pathology is noted.  This is therefore likely to be peripheral vertigo, however, she has not had improvement.  An additional dose of meclizine was still not helpful, patient cannot ambulate because of severe symptoms when she stands up or moves her head.  She will require hospitalization.   Orpah Greek, MD 10/22/17 [phone]

## 2017-10-22 NOTE — Discharge Summary (Signed)
In to d/c patient, pt sat on the side of bed with assistance, pt immediately became clammy,, nauseated, pt attempted to stand but was unable, very unsteady, assisted back to bed with staff assistance x 2.  Nystagmus noted. Pt heaving into emesis bag.  MD notified. Additional orders obtained

## 2017-10-22 NOTE — Progress Notes (Signed)
RT asked pt if she was ready for her CPAP for the night. RT had to explain to the pt what CPAP was and what it is used for. Pt said she did not need any such thing, pt declined use. RT will continue monitoring pt for need.

## 2017-10-22 NOTE — Evaluation (Signed)
Physical Therapy Evaluation Patient Details Name: Becky Mccall MRN: [id number] DOB: 07-06-1929 Today's Date: 10/22/2017   History of Present Illness  84 yo admitted with dizziness, N/V with negative MRI. PMHx: HTN, TAVR, CHF, COPD, hypothyroidism, CAD, CKD, spine fusion  Clinical Impression  Pt pleasant on arrival, reporting feeling better from early AM. Pt w/ noted resting nystagmus w/ fixed gaze supine on arrival with left beating. Pt reports double vision at end range of right visual field in sitting and in supine stated initially double vision with looking at the clock.  [facility] performed w/ pt reported no change on either side. Performed Epley maneuver L>R without reproduction of spinning and no nystagmus end of maneuver. Laruth Bouchard Daroff exercise performed without increase in symptoms. Pt with decreased stability in standing and with gait reporting feeling unsteady but no reproduction of spinning sensation. Educated for gaze stabilization with mobility and to continue to implement with mobility as well as use of RW currently for mobility. Pt able to perform smooth pursuits and visual saccades without difficulty only noting double vision in right end range. Pt will benefit from further acute therapy to maximize mobility, balance and function with additional vestibular therapy and repositioning as required.      Follow Up Recommendations Supervision for mobility/OOB;Outpatient PT    Equipment Recommendations  None recommended by PT    Recommendations for Other Services       Precautions / Restrictions Precautions Precautions: Fall      Mobility  Bed Mobility Overal bed mobility: Needs Assistance Bed Mobility: Sidelying to Sit;Supine to Sit;Sit to Sidelying   Sidelying to sit: Min guard Supine to sit: Min guard   Sit to sidelying: Independent    Transfers Overall transfer level: Modified independent               General transfer comment: w/ sitting up double  vision in Right vision field. cues for gaze fixation with transfer  Ambulation/Gait Ambulation/Gait assistance: Min guard Gait Distance (Feet): 120 Feet Assistive device: Rolling walker (2 wheeled) Gait Pattern/deviations: Step-through pattern;Decreased stride length   Gait velocity interpretation: 1.31 - 2.62 ft/sec, indicative of limited community ambulator General Gait Details: cues for gaze fixation with gait, no report of spinning or dizziness with pt stating legs feel unsteady  Stairs            Wheelchair Mobility    Modified Rankin (Stroke Patients Only)       Balance Overall balance assessment: Needs assistance   Sitting balance-Leahy Scale: Fair Sitting balance - Comments: pt with posterior LOB with scooting to EOB, able to self correct but guarding for safety     Standing balance-Leahy Scale: Fair Standing balance comment: pt able to perform static standing at sink without UE assist but with mobility required HHA or support of RW                             Pertinent Vitals/Pain Pain Location: back  Pain Descriptors / Indicators: Aching;Sore Pain Intervention(s): Monitored during session;Limited activity within patient's tolerance    Home Living Family/patient expects to be discharged to:: Private residence Living Arrangements: Alone Available Help at Discharge: Family(daughter only works 3 days week and assist occassionaly ) Type of Home: House Home Access: Stairs to enter Entrance Stairs-Rails: Right Entrance Stairs-Number of Steps: 1 step in back, 3 steps in front (usually goes in back)  Home Layout: One level Home Equipment: Walker - 2  wheels;Bedside commode;Shower seat      Prior Function Level of Independence: Independent         Comments: trouble w/ back, sx 2014     Hand Dominance   Dominant Hand: Right    Extremity/Trunk Assessment                Communication   Communication: HOH  Cognition  Arousal/Alertness: Awake/alert Behavior During Therapy: WFL for tasks assessed/performed Overall Cognitive Status: Within Functional Limits for tasks assessed                                        General Comments      Exercises     Assessment/Plan    PT Assessment Patient needs continued PT services  PT Problem List Decreased balance;Decreased coordination       PT Treatment Interventions Therapeutic activities;DME instruction;Gait training;Therapeutic exercise;Patient/family education;Balance training    PT Goals (Current goals can be found in the Care Plan section)  Acute Rehab PT Goals Patient Stated Goal: return home, feel better  PT Goal Formulation: With patient Time For Goal Achievement: 11/05/17    Frequency     Barriers to discharge        Co-evaluation               AM-PAC PT "6 Clicks" Daily Activity  Outcome Measure Difficulty turning over in bed (including adjusting bedclothes, sheets and blankets)?: A Little Difficulty moving from lying on back to sitting on the side of the bed? : A Lot Difficulty sitting down on and standing up from a chair with arms (e.g., wheelchair, bedside commode, etc,.)?: A Lot Help needed moving to and from a bed to chair (including a wheelchair)?: A Little Help needed walking in hospital room?: A Little Help needed climbing 3-5 steps with a railing? : A Lot 6 Click Score: 15    End of Session Equipment Utilized During Treatment: Gait belt Activity Tolerance: Patient tolerated treatment well;Other (comment)(limited w/ dizziness ) Patient left: in chair;with chair alarm set;with call bell/phone within reach Nurse Communication: Mobility status PT Visit Diagnosis: Dizziness and giddiness (R42);Other abnormalities of gait and mobility (R26.89)    Time: 2297-9892 PT Time Calculation (min) (ACUTE ONLY): 40 min   Charges:   PT Evaluation $PT Eval Moderate Complexity: 1 Mod PT Treatments $Therapeutic  Activity: 8-22 mins $Canalith Rep Proc: 8-22 mins       Khristopher Kapaun Pam Drown, PT Acute Rehabilitation Services Pager: [phone] Office: [phone]    Elray Dains B Burdette Forehand 10/22/2017, 2:53 PM

## 2017-10-22 NOTE — Progress Notes (Signed)
@IPLOG @        PROGRESS NOTE                                                                                                                                                                                                             Patient Demographics:    Becky Mccall, is a 84 y.o. female, DOB - Jun 28, 1929, PZW:[id number]  Admit date - 10/21/2017   Admitting Physician Vianne Bulls, MD  Outpatient Primary MD for the patient is [name], MD  LOS - 0  Chief Complaint  Patient presents with  . Dizziness  . Emesis       Brief Narrative   Becky Mccall is a 84 y.o. female with medical history significant for coronary artery disease, hypothyroidism, and hypertension, now presenting to the emergency department with dizziness, nausea, and nonbloody vomiting, likely due to BPPV.   Subjective:    [name] today has, No headache, No chest pain, No abdominal pain - No Nausea, No new weakness tingling or numbness, No Cough - SOB.     Assessment  & Plan :     1.  BPPV.  With positive nystagmus and negative MRI brain along with MRA head and neck, still has poor balance, awaiting PT OT evaluation, seen and cleared by neurology.  Placed on scheduled meclizine along with as needed Valium.  We will continue to monitor.  2.  Essential hypertension.  Stable on Norvasc.  3.  Thyroidism.  Continue home dose Synthroid no acute issues.  Diet :  Diet Order            Diet Heart Room service appropriate? Yes; Fluid consistency: Thin  Diet effective now                Family Communication  :  None  Code Status :  Full  Disposition Plan  :  HHPT  Consults  :  Neuro  Procedures  :    MRI -   MRI HEAD: 1. No acute intracranial process. 2. Mild-to-moderate chronic small vessel ischemic changes.   MRA HEAD: 1. Moderately motion degraded examination. No emergent large vessel occlusion or flow-limiting stenosis. 2. Stable appearance of 2 mm LEFT cavernous ICA and 3  mm LEFT PCOM origin aneurysms. 3. Mild stenosis bilateral M1 segments.   MRA NECK: 1. No hemodynamically significant stenosis ICA's. 2. Mild stenosis LEFT vertebral artery origin. Patent bilateral vertebral arteries  DVT Prophylaxis  :  Heparin    Lab Results  Component Value Date  PLT 174 10/21/2017    Inpatient Medications  Scheduled Meds: . amLODipine  2.5 mg Oral Daily  . aspirin EC  81 mg Oral Daily  . heparin  5,000 Units Subcutaneous Q8H  . levothyroxine  137 mcg Oral QAC breakfast  . meclizine  25 mg Oral TID  . sodium chloride flush  3 mL Intravenous Q12H  . vitamin B-12  1,000 mcg Oral Daily   Continuous Infusions: . sodium chloride     PRN Meds:.sodium chloride, acetaminophen **OR** acetaminophen, diazepam, ondansetron **OR** ondansetron (ZOFRAN) IV, sodium chloride flush, traMADol  Antibiotics  :    Anti-infectives (From admission, onward)   None         Objective:   Vitals:   10/22/17 0645 10/22/17 0811 10/22/17 0815 10/22/17 1243  BP: (!) 159/64  (!) 168/66 (!) 127/55  Pulse: 83  80 67  Resp: 17  16 16   Temp:   98.2 F (36.8 C) 97.8 F (36.6 C)  TempSrc:   Oral Oral  SpO2: 90%  94% 97%  Weight:  73.1 kg    Height:  5\' 6"  (1.676 m)      Wt Readings from Last 3 Encounters:  10/22/17 73.1 kg  10/11/17 75 kg  04/14/17 76.2 kg    No intake or output data in the 24 hours ending 10/22/17 1407   Physical Exam  Awake Alert, Oriented X 3, No new F.N deficits, Normal affect Kief.AT,PERRAL, ++ nystagmus Supple Neck,No JVD, No cervical lymphadenopathy appriciated.  Symmetrical Chest wall movement, Good air movement bilaterally, CTAB RRR,No Gallops,Rubs or new Murmurs, No Parasternal Heave +ve B.Sounds, Abd Soft, No tenderness, No organomegaly appriciated, No rebound - guarding or rigidity. No Cyanosis, Clubbing or edema, No new Rash or bruise       Data Review:    CBC Recent Labs  Lab 10/21/17 1920 10/21/17 2351  WBC  --  6.0  HGB 12.9  13.5  HCT 38.0 42.7  PLT  --  174  MCV  --  94.3  MCH  --  29.8  MCHC  --  31.6  RDW  --  13.9    Chemistries  Recent Labs  Lab 10/21/17 1920 10/21/17 2351  NA 139 140  K 4.0 4.2  CL 102 104  CO2  --  25  GLUCOSE 144* 162*  BUN 17 13  CREATININE 1.10* 1.06*  CALCIUM  --  9.0   ------------------------------------------------------------------------------------------------------------------ No results for input(s): CHOL, HDL, LDLCALC, TRIG, CHOLHDL, LDLDIRECT in the last 72 hours.  Lab Results  Component Value Date   HGBA1C 5.2 12/03/2016   ------------------------------------------------------------------------------------------------------------------ No results for input(s): TSH, T4TOTAL, T3FREE, THYROIDAB in the last 72 hours.  Invalid input(s): FREET3 ------------------------------------------------------------------------------------------------------------------ No results for input(s): VITAMINB12, FOLATE, FERRITIN, TIBC, IRON, RETICCTPCT in the last 72 hours.  Coagulation profile No results for input(s): INR, PROTIME in the last 168 hours.  No results for input(s): DDIMER in the last 72 hours.  Cardiac Enzymes No results for input(s): CKMB, TROPONINI, MYOGLOBIN in the last 168 hours.  Invalid input(s): CK ------------------------------------------------------------------------------------------------------------------ No results found for: BNP  Micro Results No results found for this or any previous visit (from the past 240 hour(s)).  Radiology Reports Mr Virgel Paling IP Contrast  Result Date: 10/22/2017 CLINICAL DATA:  Dizziness, nausea worse with head movement. History of brain aneurysm, hypertension, hyperlipidemia. EXAM: MRI HEAD WITHOUT CONTRAST MRA HEAD WITHOUT CONTRAST MRA NECK WITHOUT AND WITH CONTRAST TECHNIQUE: Multiplanar, multiecho pulse sequences of the brain and surrounding structures  were obtained without intravenous contrast. Coronal T2  sequences not obtained. Angiographic images of the Circle of Willis were obtained using MRA technique without intravenous contrast. Angiographic images of the neck were obtained using MRA technique without and with intravenous contrast. Carotid stenosis measurements (when applicable) are obtained utilizing NASCET criteria, using the distal internal carotid diameter as the adenomatous CONTRAST:  22mL MULTIHANCE GADOBENATE DIMEGLUMINE 529 MG/ML IV SOLN COMPARISON:  MRI cervical spine May 05, 2017 and CT HEAD December 09, 2016 and MRA/MRI head May 04, 2013. FINDINGS: MRI HEAD FINDINGS INTRACRANIAL CONTENTS: No reduced diffusion to suggest acute ischemia. No susceptibility artifact to suggest hemorrhage. The ventricles and sulci are normal for patient's age. Patchy supratentorial pontine white matter FLAIR T2 hyperintensities. No suspicious parenchymal signal, masses, mass effect. No abnormal extra-axial fluid collections. No extra-axial masses. VASCULAR: Normal major intracranial vascular flow voids present at skull base. SKULL AND UPPER CERVICAL SPINE: No abnormal sellar expansion. No suspicious calvarial bone marrow signal. Craniocervical junction maintained. SINUSES/ORBITS: The mastoid air-cells and included paranasal sinuses are well-aerated.The included ocular globes and orbital contents are non-suspicious. Status post bilateral ocular lens implants. OTHER: Patient is edentulous. MRA HEAD moderately motion degraded examination. ANTERIOR CIRCULATION: Normal flow related enhancement of the included cervical, petrous, cavernous and supraclinoid internal carotid arteries. Similar 2 mm laterally directed aneurysm LEFT cavernous ICA and 3 mm inferiorly directed aneurysm LEFT PCOM origin. Patent anterior communicating artery. Patent anterior and middle cerebral arteries. Mild stenosis bilateral M1 segments. No large vessel occlusion, flow limiting stenosis. POSTERIOR CIRCULATION: Codominant vertebral arteries.  Vertebrobasilar arteries are patent, with normal flow related enhancement of the main branch vessels. Patent posterior cerebral arteries. Moderate stenosis LEFT P3 segment. No large vessel occlusion, flow limiting stenosis,  aneurysm. ANATOMIC VARIANTS: None. Source images and MIP images were reviewed. MRA NECK FINDINGS ANTERIOR CIRCULATION: The common carotid arteries are widely patent bilaterally. The carotid bifurcations are patent bilaterally without hemodynamically significant stenosis by NASCET criteria. No flow limiting stenosis or luminal irregularity. POSTERIOR CIRCULATION: Bilateral vertebral arteries are patent to the vertebrobasilar junction. Mild stenosis LEFT vertebral artery origin. No flow limiting stenosis or luminal irregularity. Source images and MIP images were reviewed. IMPRESSION: MRI HEAD: 1. No acute intracranial process. 2. Mild-to-moderate chronic small vessel ischemic changes. MRA HEAD: 1. Moderately motion degraded examination. No emergent large vessel occlusion or flow-limiting stenosis. 2. Stable appearance of 2 mm LEFT cavernous ICA and 3 mm LEFT PCOM origin aneurysms. 3. Mild stenosis bilateral M1 segments. MRA NECK: 1. No hemodynamically significant stenosis ICA's. 2. Mild stenosis LEFT vertebral artery origin. Patent bilateral vertebral arteries. Electronically Signed   By: Elon Alas M.D.   On: 10/22/2017 02:26   Mr Angiogram Neck W Or Wo Contrast  Result Date: 10/22/2017 CLINICAL DATA:  Dizziness, nausea worse with head movement. History of brain aneurysm, hypertension, hyperlipidemia. EXAM: MRI HEAD WITHOUT CONTRAST MRA HEAD WITHOUT CONTRAST MRA NECK WITHOUT AND WITH CONTRAST TECHNIQUE: Multiplanar, multiecho pulse sequences of the brain and surrounding structures were obtained without intravenous contrast. Coronal T2 sequences not obtained. Angiographic images of the Circle of Willis were obtained using MRA technique without intravenous contrast. Angiographic images  of the neck were obtained using MRA technique without and with intravenous contrast. Carotid stenosis measurements (when applicable) are obtained utilizing NASCET criteria, using the distal internal carotid diameter as the adenomatous CONTRAST:  51mL MULTIHANCE GADOBENATE DIMEGLUMINE 529 MG/ML IV SOLN COMPARISON:  MRI cervical spine May 05, 2017 and CT HEAD December 09, 2016 and MRA/MRI head May 04, 2013. FINDINGS: MRI HEAD FINDINGS INTRACRANIAL CONTENTS: No reduced diffusion to suggest acute ischemia. No susceptibility artifact to suggest hemorrhage. The ventricles and sulci are normal for patient's age. Patchy supratentorial pontine white matter FLAIR T2 hyperintensities. No suspicious parenchymal signal, masses, mass effect. No abnormal extra-axial fluid collections. No extra-axial masses. VASCULAR: Normal major intracranial vascular flow voids present at skull base. SKULL AND UPPER CERVICAL SPINE: No abnormal sellar expansion. No suspicious calvarial bone marrow signal. Craniocervical junction maintained. SINUSES/ORBITS: The mastoid air-cells and included paranasal sinuses are well-aerated.The included ocular globes and orbital contents are non-suspicious. Status post bilateral ocular lens implants. OTHER: Patient is edentulous. MRA HEAD moderately motion degraded examination. ANTERIOR CIRCULATION: Normal flow related enhancement of the included cervical, petrous, cavernous and supraclinoid internal carotid arteries. Similar 2 mm laterally directed aneurysm LEFT cavernous ICA and 3 mm inferiorly directed aneurysm LEFT PCOM origin. Patent anterior communicating artery. Patent anterior and middle cerebral arteries. Mild stenosis bilateral M1 segments. No large vessel occlusion, flow limiting stenosis. POSTERIOR CIRCULATION: Codominant vertebral arteries. Vertebrobasilar arteries are patent, with normal flow related enhancement of the main branch vessels. Patent posterior cerebral arteries. Moderate stenosis  LEFT P3 segment. No large vessel occlusion, flow limiting stenosis,  aneurysm. ANATOMIC VARIANTS: None. Source images and MIP images were reviewed. MRA NECK FINDINGS ANTERIOR CIRCULATION: The common carotid arteries are widely patent bilaterally. The carotid bifurcations are patent bilaterally without hemodynamically significant stenosis by NASCET criteria. No flow limiting stenosis or luminal irregularity. POSTERIOR CIRCULATION: Bilateral vertebral arteries are patent to the vertebrobasilar junction. Mild stenosis LEFT vertebral artery origin. No flow limiting stenosis or luminal irregularity. Source images and MIP images were reviewed. IMPRESSION: MRI HEAD: 1. No acute intracranial process. 2. Mild-to-moderate chronic small vessel ischemic changes. MRA HEAD: 1. Moderately motion degraded examination. No emergent large vessel occlusion or flow-limiting stenosis. 2. Stable appearance of 2 mm LEFT cavernous ICA and 3 mm LEFT PCOM origin aneurysms. 3. Mild stenosis bilateral M1 segments. MRA NECK: 1. No hemodynamically significant stenosis ICA's. 2. Mild stenosis LEFT vertebral artery origin. Patent bilateral vertebral arteries. Electronically Signed   By: Elon Alas M.D.   On: 10/22/2017 02:26   Mr Brain Wo Contrast  Result Date: 10/22/2017 CLINICAL DATA:  Dizziness, nausea worse with head movement. History of brain aneurysm, hypertension, hyperlipidemia. EXAM: MRI HEAD WITHOUT CONTRAST MRA HEAD WITHOUT CONTRAST MRA NECK WITHOUT AND WITH CONTRAST TECHNIQUE: Multiplanar, multiecho pulse sequences of the brain and surrounding structures were obtained without intravenous contrast. Coronal T2 sequences not obtained. Angiographic images of the Circle of Willis were obtained using MRA technique without intravenous contrast. Angiographic images of the neck were obtained using MRA technique without and with intravenous contrast. Carotid stenosis measurements (when applicable) are obtained utilizing NASCET  criteria, using the distal internal carotid diameter as the adenomatous CONTRAST:  79mL MULTIHANCE GADOBENATE DIMEGLUMINE 529 MG/ML IV SOLN COMPARISON:  MRI cervical spine May 05, 2017 and CT HEAD December 09, 2016 and MRA/MRI head May 04, 2013. FINDINGS: MRI HEAD FINDINGS INTRACRANIAL CONTENTS: No reduced diffusion to suggest acute ischemia. No susceptibility artifact to suggest hemorrhage. The ventricles and sulci are normal for patient's age. Patchy supratentorial pontine white matter FLAIR T2 hyperintensities. No suspicious parenchymal signal, masses, mass effect. No abnormal extra-axial fluid collections. No extra-axial masses. VASCULAR: Normal major intracranial vascular flow voids present at skull base. SKULL AND UPPER CERVICAL SPINE: No abnormal sellar expansion. No suspicious calvarial bone marrow signal. Craniocervical junction maintained. SINUSES/ORBITS: The mastoid air-cells and included paranasal sinuses are  well-aerated.The included ocular globes and orbital contents are non-suspicious. Status post bilateral ocular lens implants. OTHER: Patient is edentulous. MRA HEAD moderately motion degraded examination. ANTERIOR CIRCULATION: Normal flow related enhancement of the included cervical, petrous, cavernous and supraclinoid internal carotid arteries. Similar 2 mm laterally directed aneurysm LEFT cavernous ICA and 3 mm inferiorly directed aneurysm LEFT PCOM origin. Patent anterior communicating artery. Patent anterior and middle cerebral arteries. Mild stenosis bilateral M1 segments. No large vessel occlusion, flow limiting stenosis. POSTERIOR CIRCULATION: Codominant vertebral arteries. Vertebrobasilar arteries are patent, with normal flow related enhancement of the main branch vessels. Patent posterior cerebral arteries. Moderate stenosis LEFT P3 segment. No large vessel occlusion, flow limiting stenosis,  aneurysm. ANATOMIC VARIANTS: None. Source images and MIP images were reviewed. MRA NECK FINDINGS  ANTERIOR CIRCULATION: The common carotid arteries are widely patent bilaterally. The carotid bifurcations are patent bilaterally without hemodynamically significant stenosis by NASCET criteria. No flow limiting stenosis or luminal irregularity. POSTERIOR CIRCULATION: Bilateral vertebral arteries are patent to the vertebrobasilar junction. Mild stenosis LEFT vertebral artery origin. No flow limiting stenosis or luminal irregularity. Source images and MIP images were reviewed. IMPRESSION: MRI HEAD: 1. No acute intracranial process. 2. Mild-to-moderate chronic small vessel ischemic changes. MRA HEAD: 1. Moderately motion degraded examination. No emergent large vessel occlusion or flow-limiting stenosis. 2. Stable appearance of 2 mm LEFT cavernous ICA and 3 mm LEFT PCOM origin aneurysms. 3. Mild stenosis bilateral M1 segments. MRA NECK: 1. No hemodynamically significant stenosis ICA's. 2. Mild stenosis LEFT vertebral artery origin. Patent bilateral vertebral arteries. Electronically Signed   By: Elon Alas M.D.   On: 10/22/2017 02:26    Time Spent in minutes  30   Lala Lund M.D on 10/22/2017 at 2:07 PM  To page go to [email or web address] - password [email or web address]

## 2017-10-22 NOTE — ED Notes (Signed)
Pt to MRI via stretcher.

## 2017-10-22 NOTE — Consult Note (Signed)
Consulted for nystagmus in patient with h/o of peripheral vertigo.  Nystagmus is horizontal with fast phase towards right, increased when doing DiX hallpike on left side favors peripheral vertigo. Has no ataxia on exam. Cover and uncover test negative. MRI brain - no acute stroke. MRA negative for stenosis.   Peripheral vertigo Recommend PT/OT and vestibular exercises ENT follow up

## 2017-10-22 NOTE — ED Notes (Signed)
Attempt made again to ambulate patient, pt continues to be unsteady, requiring heavy 2 person assist, +nystagmus. +nausea.

## 2017-10-23 DIAGNOSIS — R42 Dizziness and giddiness: Secondary | ICD-10-CM | POA: Diagnosis not present

## 2017-10-23 MED ORDER — ONDANSETRON HCL 4 MG PO TABS: 4.0000 mg | ORAL_TABLET | Freq: Four times a day (QID) | ORAL | 0 refills | Status: DC | PRN

## 2017-10-23 MED ORDER — MECLIZINE HCL 25 MG PO TABS: 25.0000 mg | ORAL_TABLET | Freq: Three times a day (TID) | ORAL | 0 refills | Status: DC | PRN

## 2017-10-23 NOTE — Progress Notes (Signed)
Physical Therapy Treatment Patient Details Name: Becky Mccall MRN: [id number] DOB: 1929-08-21 Today's Date: 10/23/2017    History of Present Illness 84 yo admitted with dizziness, N/V with negative MRI. PMHx: HTN, TAVR, CHF, COPD, hypothyroidism, CAD, CKD, spine fusion    PT Comments    Pt is making good progress and is less dizzy today compared to yesterday, but continues to have dizziness with transitions, turning, and gait.  Compensatory strategies used as well as a RW and pt did better.  X1 seated vestibular exercises given and reviewed with pt and her daughter as well as handout.  I suspect a R ear hypofunction vs a possible Meniere's disease (given episodic in nature and she has some progressive hearing loss).  I recommend f/u with a vestibular PT and f/u with ENT who specializes in vestibular dysfunctions.  PT will continue to follow acutely to progress safe mobility.   Follow Up Recommendations  Supervision for mobility/OOB;Outpatient PT(vestibular)     Equipment Recommendations  None recommended by PT    Recommendations for Other Services   NA     Precautions / Restrictions Precautions Precautions: Fall    Mobility  Bed Mobility Overal bed mobility: Needs Assistance Bed Mobility: Rolling;Supine to Sit;Sit to Supine Rolling: Supervision Sidelying to sit: Modified independent (Device/Increase time) Supine to sit: Modified independent (Device/Increase time)     General bed mobility comments: Pt using bed rail and struggling a bit to get up, but able to do so without physical assist from therapist, (+) sway when sitting up.   Transfers Overall transfer level: Needs assistance Equipment used: Rolling walker (2 wheeled);1 person hand held assist Transfers: Sit to/from Stand Sit to Stand: Min assist;Supervision         General transfer comment: Min assist if used hand held, supervision with RW, cues to stand for a few seconds to let any dizziness settle before  proceding with gait.   Ambulation/Gait Ambulation/Gait assistance: Supervision;Min assist Gait Distance (Feet): 10 Feet(x2) Assistive device: Rolling walker (2 wheeled);1 person hand held assist Gait Pattern/deviations: Step-through pattern;Staggering left;Staggering right Gait velocity: decreased and guarded.    General Gait Details: Pt with mildly staggering gait pattern, especially when turning.  We tried gait without AD first which required min hand held assist for balance, and then tried with RW utilizing compensatory mechanisms (targets, segmental turning) and pt did much better, supervision.            Balance Overall balance assessment: Needs assistance Sitting-balance support: Feet supported;No upper extremity supported Sitting balance-Leahy Scale: Fair Sitting balance - Comments: Pt prefers to hold on with her hands, practiced tipping forward to the floor as pt picks up her little dog regularly x 3 repetitions and pt became dizzy.  Encouraged her to use her water and food long handled scooper to feed the dog insteady of bending over and standing back up as she normally does.    Standing balance support: Bilateral upper extremity supported Standing balance-Leahy Scale: Poor Standing balance comment: needs external support.            10/23/17 1454  Vestibular Assessment  General Observation no recent head trauma, no recent antibiotic use, recent prednisone for sciatica (only recent medication change), no URI or sinus drainage, no head trauma or whiplash injury, some double vision reported in end range gaze, pt wears glasses for reading and driving, but not other times, no HA, does report episodic dizziness and complete hearing loss in her R ear and wears hearing aid  in her left hear.  No aural fullness or tinnitis.   Symptom Behavior  Type of Dizziness Imbalance  Frequency of Dizziness constant  Duration of Dizziness constant  Aggravating Factors Activity in general   Relieving Factors Lying supine  Occulomotor Exam  Occulomotor Alignment Normal  Spontaneous Absent  Gaze-induced Left beating nystagmus with R gaze  Smooth Pursuits Saccades  Vestibulo-Occular Reflex  VOR 1 Head Only (x 1 viewing) able to do both vertically and horizontally, slow, symptomatic in both directions (especially if made to speed up) and vertical is more symptomatic.   VOR Cancellation Corrective saccades  Comment MRI negative for stroke, does have some stenosis of her vertebral arteries, but not significant per MRI report.   Auditory  Comments pt reports complete loss of hearing R ear, and HOH L ear-wears a hearing aide.  Positional Testing  Dix-Hallpike  (checked last session, was negative)  Horizontal Canal Testing Horizontal Canal Right;Horizontal Canal Left  Horizontal Canal Right  Horizontal Canal Right Duration  (0)  Horizontal Canal Right Symptoms Normal  Horizontal Canal Left  Horizontal Canal Left Duration 0 (false positive nystagmus as pt was gazing at end range)  Horizontal Canal Left Symptoms Other (comment) (some left beating upwardly rotating nystagmus, but no symptoms making me think this is a false positive, we are kicking in some of the nystagmus seen in sitting with oculomotor exam), only preformed this test 1. Because it was not preformed yesterday, and 2. Because the nystagmus was not brisk and kind of hard to see (making sure I was not confusing it with end range beats vs saccadic intrusions during smooth pursuits).    Cognition  Cognition Orientation Level  (h/o memory deficits at baseline)  Positional Sensitivities  Nose to Right Knee 2  Right Knee to Sitting 2  Nose to Left Knee 2  Left Knee to Sitting 2  Pivot Right in Standing 2  Pivot Left in Standing 2  Orthostatics  Orthostatics Comment checked last session and were negative.                        Cognition Arousal/Alertness: Awake/alert Behavior During Therapy: WFL for  tasks assessed/performed Overall Cognitive Status: History of cognitive impairments - at baseline                                 General Comments: per pt report h/o memory deficits      Exercises Other Exercises Other Exercises: x1 exercises seated (both vertical and horizontal) given with handout.  Encouraged pt to start with 3 times per day of each and increase to 5 times per day shooting for a minute each in duration.     General Comments General comments (skin integrity, edema, etc.): Educated pt and her daughter heavily on compensatory strategies, exercises, and safety.        Pertinent Vitals/Pain Pain Assessment: No/denies pain           PT Goals (current goals can now be found in the care plan section) Acute Rehab PT Goals Patient Stated Goal: return home, feel better  Progress towards PT goals: Progressing toward goals    Frequency    Min 4X/week      PT Plan Current plan remains appropriate       AM-PAC PT "6 Clicks" Daily Activity  Outcome Measure  Difficulty turning over in bed (including adjusting bedclothes, sheets and blankets)?:  A Little Difficulty moving from lying on back to sitting on the side of the bed? : A Little Difficulty sitting down on and standing up from a chair with arms (e.g., wheelchair, bedside commode, etc,.)?: Unable Help needed moving to and from a bed to chair (including a wheelchair)?: A Little Help needed walking in hospital room?: A Little Help needed climbing 3-5 steps with a railing? : A Little 6 Click Score: 16    End of Session Equipment Utilized During Treatment: Gait belt Activity Tolerance: Patient tolerated treatment well Patient left: in bed;with call bell/phone within reach;with family/visitor present   PT Visit Diagnosis: Dizziness and giddiness (R42);Other abnormalities of gait and mobility (R26.89)     Time: 5852-7782 PT Time Calculation (min) (ACUTE ONLY): 52 min  Charges:  $Gait Training:  23-37 mins $Therapeutic Exercise: 8-22 mins                    Tamarcus Condie B. Garrett, [place], DPT [phone]   10/23/2017, 3:00 PM

## 2017-10-23 NOTE — Progress Notes (Signed)
Patient and daughter given d/c instructions and summary. Able to verbalize understanding and teach back. No new questions or concerns. Discharged from unit in wheelchair, family to transport home. IV removed

## 2017-10-23 NOTE — Discharge Summary (Signed)
Becky Mccall CLE:[id number] DOB: 1930-02-17 DOA: 10/21/2017  PCP: Becky Stains, MD  Admit date: 10/21/2017  Discharge date: 10/23/2017  Admitted From: Home   Disposition:  Home   Recommendations for Outpatient Follow-up:   Follow up with PCP in 1-2 weeks  PCP Please obtain BMP/CBC, 2 view CXR in 1week,  (see Discharge instructions)   PCP Please follow up on the following pending results:    Home Health: PT, RN   Equipment/Devices: Walker - rolling  Consultations: Neuro Discharge Condition: Satble   CODE STATUS: Full   Diet Recommendation: Heart Healthy    Chief Complaint  Patient presents with  . Dizziness  . Emesis     Brief history of present illness from the day of admission and additional interim summary    Becky W Hendersonis a 84 y.o.femalewith medical history significant forcoronary artery disease, hypothyroidism, and hypertension, now presenting to the emergency department with dizziness, nausea, and nonbloody vomiting, likely due to BPPV.                                                                 Hospital Course    1.  BPPV.  With positive nystagmus and negative MRI brain along with MRA head and neck, she has had similar symptoms of vertigo in the past, placed on scheduled meclizine here along with few doses of Valium with good effect, seen by PT, symptoms much improved, this morning went to the bathroom by herself using a walker, no emesis, no falls.  Will be discharged home on as needed meclizine and Zofran with home PT and rolling walker, since this seems to be a recurrent problem for her will refer her up with ENT outpatient upon discharge as well.  Her not to drive until vertigo resolves.  2.  Essential hypertension.  Stable on Norvasc.  3. Hypothyroidism.  Continue home  dose Synthroid no acute issues.    Discharge diagnosis     Principal Problem:   Vertigo Active Problems:   Hypothyroidism   OSA (obstructive sleep apnea)   Essential hypertension    Discharge instructions    Discharge Instructions    Diet - low sodium heart healthy   Complete by:  As directed    Discharge instructions   Complete by:  As directed    Follow with Primary MD Becky Stains, MD in 7 days   Get CBC, BMP,  ray checked  by Primary MD  in 5-7 days   Activity: As tolerated with Full fall precautions use walker/cane & assistance as needed  Disposition Home   Diet:  Heart Healthy    For Heart failure patients - Check your Weight same time everyday, if you gain over 2 pounds, or you develop in leg swelling, experience more shortness of breath or chest pain,  call your Primary MD immediately. Follow Cardiac Low Salt Diet and 1.5 lit/day fluid restriction.  Special Instructions: If you have smoked or chewed Tobacco  in the last 2 yrs please stop smoking, stop any regular Alcohol  and or any Recreational drug use.  On your next visit with your primary care physician please Get Medicines reviewed and adjusted.  Please request your Prim.MD to go over all Hospital Tests and Procedure/Radiological results at the follow up, please get all Hospital records sent to your Prim MD by signing hospital release before you go home.  If you experience worsening of your admission symptoms, develop shortness of breath, life threatening emergency, suicidal or homicidal thoughts you must seek medical attention immediately by calling 911 or calling your MD immediately  if symptoms less severe.  Do not drive, until you have seen by Primary MD and advised to do so again.   Increase activity slowly   Complete by:  As directed       Discharge Medications   Allergies as of 10/23/2017      Reactions   Lisinopril Cough   Relafen [nabumetone] Other (See Comments)   Unsure of exact reaction.    Statins    Muscle pain   Tape    Developed blisters from clear tape used over cath site 10/2016   Welchol [colesevelam] Other (See Comments)   Muscle pain   Zetia [ezetimibe] Other (See Comments)   Muscle pain.   Neosporin [neomycin-bacitracin Zn-polymyx] Other (See Comments)   Cause wound/scratch to worsen   Zinc Oxide Rash      Medication List    TAKE these medications   amLODipine 2.5 MG tablet Commonly known as:  NORVASC Take 2.5 mg by mouth daily.   aspirin EC 81 MG tablet Take 1 tablet (81 mg total) by mouth daily.   CALCIUM 1000 + D PO Take 1 tablet by mouth daily.   Cinnamon 500 MG capsule Take 500 mg by mouth 2 (two) times daily.   Fish Oil 1000 MG Caps Take 1,000 mg by mouth 2 (two) times daily.   levothyroxine 137 MCG tablet Commonly known as:  SYNTHROID, LEVOTHROID Take 137 mcg by mouth daily before breakfast.   meclizine 25 MG tablet Commonly known as:  ANTIVERT Take 1 tablet (25 mg total) by mouth 3 (three) times daily as needed for dizziness. What changed:    how much to take  when to take this   multivitamin with minerals Tabs tablet Take 1 tablet by mouth daily.   ondansetron 4 MG tablet Commonly known as:  ZOFRAN Take 1 tablet (4 mg total) by mouth every 6 (six) hours as needed for nausea.   traMADol 50 MG tablet Commonly known as:  ULTRAM Take 1 tablet (50 mg total) by mouth every 8 (eight) hours as needed (pain not relieved by tylenol).   Turmeric 500 MG Caps Take 500 mg by mouth 2 (two) times daily.   vitamin B-12 1000 MCG tablet Commonly known as:  CYANOCOBALAMIN Take 1,000 mcg by mouth daily.   vitamin C 500 MG tablet Commonly known as:  ASCORBIC ACID Take 500 mg by mouth daily.   Vitamin D 2000 units tablet Take 6,000 Units by mouth daily.   vitamin E 400 UNIT capsule Take 400 Units by mouth 2 (two) times daily.            Durable Medical Equipment  (From admission, onward)         Start  Ordered    10/23/17 1138  For home use only DME Walker rolling  [facility])  Once    Question:  Patient needs a walker to treat with the following condition  Answer:  Vertigo   10/23/17 1138          Follow-up Information    Becky Stains, MD.   Specialty:  Family Medicine Why:  Please call your doctor for follow up in 2-5 days Contact information: [place] [address] [phone]        Rozetta Nunnery, MD. Schedule an appointment as soon as possible for a visit in 1 week(s).   Specialty:  Otolaryngology Why:  Recurrent BPPV Contact information: [place] [address] [phone]           Major procedures and Radiology Reports - PLEASE review detailed and final reports thoroughly  -         Mr Jodene Nam Head Wo Contrast  Result Date: 10/22/2017 CLINICAL DATA:  Dizziness, nausea worse with head movement. History of brain aneurysm, hypertension, hyperlipidemia. EXAM: MRI HEAD WITHOUT CONTRAST MRA HEAD WITHOUT CONTRAST MRA NECK WITHOUT AND WITH CONTRAST TECHNIQUE: Multiplanar, multiecho pulse sequences of the brain and surrounding structures were obtained without intravenous contrast. Coronal T2 sequences not obtained. Angiographic images of the Circle of Willis were obtained using MRA technique without intravenous contrast. Angiographic images of the neck were obtained using MRA technique without and with intravenous contrast. Carotid stenosis measurements (when applicable) are obtained utilizing NASCET criteria, using the distal internal carotid diameter as the adenomatous CONTRAST:  96mL MULTIHANCE GADOBENATE DIMEGLUMINE 529 MG/ML IV SOLN COMPARISON:  MRI cervical spine May 05, 2017 and CT HEAD December 09, 2016 and MRA/MRI head May 04, 2013. FINDINGS: MRI HEAD FINDINGS INTRACRANIAL CONTENTS: No reduced diffusion to suggest acute ischemia. No susceptibility artifact to suggest hemorrhage. The ventricles and sulci are normal for  patient's age. Patchy supratentorial pontine white matter FLAIR T2 hyperintensities. No suspicious parenchymal signal, masses, mass effect. No abnormal extra-axial fluid collections. No extra-axial masses. VASCULAR: Normal major intracranial vascular flow voids present at skull base. SKULL AND UPPER CERVICAL SPINE: No abnormal sellar expansion. No suspicious calvarial bone marrow signal. Craniocervical junction maintained. SINUSES/ORBITS: The mastoid air-cells and included paranasal sinuses are well-aerated.The included ocular globes and orbital contents are non-suspicious. Status post bilateral ocular lens implants. OTHER: Patient is edentulous. MRA HEAD moderately motion degraded examination. ANTERIOR CIRCULATION: Normal flow related enhancement of the included cervical, petrous, cavernous and supraclinoid internal carotid arteries. Similar 2 mm laterally directed aneurysm LEFT cavernous ICA and 3 mm inferiorly directed aneurysm LEFT PCOM origin. Patent anterior communicating artery. Patent anterior and middle cerebral arteries. Mild stenosis bilateral M1 segments. No large vessel occlusion, flow limiting stenosis. POSTERIOR CIRCULATION: Codominant vertebral arteries. Vertebrobasilar arteries are patent, with normal flow related enhancement of the main branch vessels. Patent posterior cerebral arteries. Moderate stenosis LEFT P3 segment. No large vessel occlusion, flow limiting stenosis,  aneurysm. ANATOMIC VARIANTS: None. Source images and MIP images were reviewed. MRA NECK FINDINGS ANTERIOR CIRCULATION: The common carotid arteries are widely patent bilaterally. The carotid bifurcations are patent bilaterally without hemodynamically significant stenosis by NASCET criteria. No flow limiting stenosis or luminal irregularity. POSTERIOR CIRCULATION: Bilateral vertebral arteries are patent to the vertebrobasilar junction. Mild stenosis LEFT vertebral artery origin. No flow limiting stenosis or luminal irregularity.  Source images and MIP images were reviewed. IMPRESSION: MRI HEAD: 1. No acute intracranial process. 2. Mild-to-moderate chronic small vessel  ischemic changes. MRA HEAD: 1. Moderately motion degraded examination. No emergent large vessel occlusion or flow-limiting stenosis. 2. Stable appearance of 2 mm LEFT cavernous ICA and 3 mm LEFT PCOM origin aneurysms. 3. Mild stenosis bilateral M1 segments. MRA NECK: 1. No hemodynamically significant stenosis ICA's. 2. Mild stenosis LEFT vertebral artery origin. Patent bilateral vertebral arteries. Electronically Signed   By: Elon Alas M.D.   On: 10/22/2017 02:26   Mr Angiogram Neck W Or Wo Contrast  Result Date: 10/22/2017 CLINICAL DATA:  Dizziness, nausea worse with head movement. History of brain aneurysm, hypertension, hyperlipidemia. EXAM: MRI HEAD WITHOUT CONTRAST MRA HEAD WITHOUT CONTRAST MRA NECK WITHOUT AND WITH CONTRAST TECHNIQUE: Multiplanar, multiecho pulse sequences of the brain and surrounding structures were obtained without intravenous contrast. Coronal T2 sequences not obtained. Angiographic images of the Circle of Willis were obtained using MRA technique without intravenous contrast. Angiographic images of the neck were obtained using MRA technique without and with intravenous contrast. Carotid stenosis measurements (when applicable) are obtained utilizing NASCET criteria, using the distal internal carotid diameter as the adenomatous CONTRAST:  30mL MULTIHANCE GADOBENATE DIMEGLUMINE 529 MG/ML IV SOLN COMPARISON:  MRI cervical spine May 05, 2017 and CT HEAD December 09, 2016 and MRA/MRI head May 04, 2013. FINDINGS: MRI HEAD FINDINGS INTRACRANIAL CONTENTS: No reduced diffusion to suggest acute ischemia. No susceptibility artifact to suggest hemorrhage. The ventricles and sulci are normal for patient's age. Patchy supratentorial pontine white matter FLAIR T2 hyperintensities. No suspicious parenchymal signal, masses, mass effect. No abnormal  extra-axial fluid collections. No extra-axial masses. VASCULAR: Normal major intracranial vascular flow voids present at skull base. SKULL AND UPPER CERVICAL SPINE: No abnormal sellar expansion. No suspicious calvarial bone marrow signal. Craniocervical junction maintained. SINUSES/ORBITS: The mastoid air-cells and included paranasal sinuses are well-aerated.The included ocular globes and orbital contents are non-suspicious. Status post bilateral ocular lens implants. OTHER: Patient is edentulous. MRA HEAD moderately motion degraded examination. ANTERIOR CIRCULATION: Normal flow related enhancement of the included cervical, petrous, cavernous and supraclinoid internal carotid arteries. Similar 2 mm laterally directed aneurysm LEFT cavernous ICA and 3 mm inferiorly directed aneurysm LEFT PCOM origin. Patent anterior communicating artery. Patent anterior and middle cerebral arteries. Mild stenosis bilateral M1 segments. No large vessel occlusion, flow limiting stenosis. POSTERIOR CIRCULATION: Codominant vertebral arteries. Vertebrobasilar arteries are patent, with normal flow related enhancement of the main branch vessels. Patent posterior cerebral arteries. Moderate stenosis LEFT P3 segment. No large vessel occlusion, flow limiting stenosis,  aneurysm. ANATOMIC VARIANTS: None. Source images and MIP images were reviewed. MRA NECK FINDINGS ANTERIOR CIRCULATION: The common carotid arteries are widely patent bilaterally. The carotid bifurcations are patent bilaterally without hemodynamically significant stenosis by NASCET criteria. No flow limiting stenosis or luminal irregularity. POSTERIOR CIRCULATION: Bilateral vertebral arteries are patent to the vertebrobasilar junction. Mild stenosis LEFT vertebral artery origin. No flow limiting stenosis or luminal irregularity. Source images and MIP images were reviewed. IMPRESSION: MRI HEAD: 1. No acute intracranial process. 2. Mild-to-moderate chronic small vessel ischemic  changes. MRA HEAD: 1. Moderately motion degraded examination. No emergent large vessel occlusion or flow-limiting stenosis. 2. Stable appearance of 2 mm LEFT cavernous ICA and 3 mm LEFT PCOM origin aneurysms. 3. Mild stenosis bilateral M1 segments. MRA NECK: 1. No hemodynamically significant stenosis ICA's. 2. Mild stenosis LEFT vertebral artery origin. Patent bilateral vertebral arteries. Electronically Signed   By: Elon Alas M.D.   On: 10/22/2017 02:26   Mr Brain Wo Contrast  Result Date: 10/22/2017 CLINICAL DATA:  Dizziness, nausea worse  with head movement. History of brain aneurysm, hypertension, hyperlipidemia. EXAM: MRI HEAD WITHOUT CONTRAST MRA HEAD WITHOUT CONTRAST MRA NECK WITHOUT AND WITH CONTRAST TECHNIQUE: Multiplanar, multiecho pulse sequences of the brain and surrounding structures were obtained without intravenous contrast. Coronal T2 sequences not obtained. Angiographic images of the Circle of Willis were obtained using MRA technique without intravenous contrast. Angiographic images of the neck were obtained using MRA technique without and with intravenous contrast. Carotid stenosis measurements (when applicable) are obtained utilizing NASCET criteria, using the distal internal carotid diameter as the adenomatous CONTRAST:  26mL MULTIHANCE GADOBENATE DIMEGLUMINE 529 MG/ML IV SOLN COMPARISON:  MRI cervical spine May 05, 2017 and CT HEAD December 09, 2016 and MRA/MRI head May 04, 2013. FINDINGS: MRI HEAD FINDINGS INTRACRANIAL CONTENTS: No reduced diffusion to suggest acute ischemia. No susceptibility artifact to suggest hemorrhage. The ventricles and sulci are normal for patient's age. Patchy supratentorial pontine white matter FLAIR T2 hyperintensities. No suspicious parenchymal signal, masses, mass effect. No abnormal extra-axial fluid collections. No extra-axial masses. VASCULAR: Normal major intracranial vascular flow voids present at skull base. SKULL AND UPPER CERVICAL SPINE: No  abnormal sellar expansion. No suspicious calvarial bone marrow signal. Craniocervical junction maintained. SINUSES/ORBITS: The mastoid air-cells and included paranasal sinuses are well-aerated.The included ocular globes and orbital contents are non-suspicious. Status post bilateral ocular lens implants. OTHER: Patient is edentulous. MRA HEAD moderately motion degraded examination. ANTERIOR CIRCULATION: Normal flow related enhancement of the included cervical, petrous, cavernous and supraclinoid internal carotid arteries. Similar 2 mm laterally directed aneurysm LEFT cavernous ICA and 3 mm inferiorly directed aneurysm LEFT PCOM origin. Patent anterior communicating artery. Patent anterior and middle cerebral arteries. Mild stenosis bilateral M1 segments. No large vessel occlusion, flow limiting stenosis. POSTERIOR CIRCULATION: Codominant vertebral arteries. Vertebrobasilar arteries are patent, with normal flow related enhancement of the main branch vessels. Patent posterior cerebral arteries. Moderate stenosis LEFT P3 segment. No large vessel occlusion, flow limiting stenosis,  aneurysm. ANATOMIC VARIANTS: None. Source images and MIP images were reviewed. MRA NECK FINDINGS ANTERIOR CIRCULATION: The common carotid arteries are widely patent bilaterally. The carotid bifurcations are patent bilaterally without hemodynamically significant stenosis by NASCET criteria. No flow limiting stenosis or luminal irregularity. POSTERIOR CIRCULATION: Bilateral vertebral arteries are patent to the vertebrobasilar junction. Mild stenosis LEFT vertebral artery origin. No flow limiting stenosis or luminal irregularity. Source images and MIP images were reviewed. IMPRESSION: MRI HEAD: 1. No acute intracranial process. 2. Mild-to-moderate chronic small vessel ischemic changes. MRA HEAD: 1. Moderately motion degraded examination. No emergent large vessel occlusion or flow-limiting stenosis. 2. Stable appearance of 2 mm LEFT cavernous ICA  and 3 mm LEFT PCOM origin aneurysms. 3. Mild stenosis bilateral M1 segments. MRA NECK: 1. No hemodynamically significant stenosis ICA's. 2. Mild stenosis LEFT vertebral artery origin. Patent bilateral vertebral arteries. Electronically Signed   By: Elon Alas M.D.   On: 10/22/2017 02:26    Micro Results     No results found for this or any previous visit (from the past 240 hour(s)).  Today   Subjective    Chere Babson today has no headache,no chest abdominal pain,no new weakness tingling or numbness, feels much better wants to go home today. Mild Vertigo.   Objective   Blood pressure (!) 158/62, pulse 64, temperature 98.3 F (36.8 C), temperature source Oral, resp. rate 19, height 5\' 6"  (1.676 m), weight 73.1 kg, SpO2 94 %.  No intake or output data in the 24 hours ending 10/23/17 1138  Exam Awake Alert, Oriented x  3, No new F.N deficits, Normal affect [place].AT,PERRAL, +ve Nystagmus Supple Neck,No JVD, No cervical lymphadenopathy appriciated.  Symmetrical Chest wall movement, Good air movement bilaterally, CTAB RRR,No Gallops,Rubs or new Murmurs, No Parasternal Heave +ve B.Sounds, Abd Soft, Non tender, No organomegaly appriciated, No rebound -guarding or rigidity. No Cyanosis, Clubbing or edema, No new Rash or bruise   Data Review   CBC w Diff:  Lab Results  Component Value Date   WBC 6.0 10/21/2017   HGB 13.5 10/21/2017   HGB 13.1 11/10/2016   HCT 42.7 10/21/2017   HCT 39.1 11/10/2016   PLT 174 10/21/2017   PLT 210 11/10/2016   LYMPHOPCT 17 05/04/2013   MONOPCT 8 05/04/2013   EOSPCT 1 05/04/2013   BASOPCT 1 05/04/2013    CMP:  Lab Results  Component Value Date   NA 140 10/21/2017   NA 135 10/11/2017   K 4.2 10/21/2017   CL 104 10/21/2017   CO2 25 10/21/2017   BUN 13 10/21/2017   BUN 20 10/11/2017   CREATININE 1.06 (H) 10/21/2017   PROT 6.9 12/03/2016   ALBUMIN 3.9 12/03/2016   BILITOT 0.6 12/03/2016   ALKPHOS 79 12/03/2016   AST 26 12/03/2016     ALT 10 (L) 12/03/2016  .   Total Time in preparing paper work, data evaluation and todays exam - 50 minutes  Lala Lund M.D on 10/23/2017 at 11:38 AM  [facility]   Office  [phone]

## 2017-10-23 NOTE — Care Management Obs Status (Signed)
[place] NOTIFICATION   Patient Details  Name: Becky Mccall MRN: [id number] Date of Birth: [birth year]   Medicare Observation Status Notification Given:  Yes    Carles Collet, RN 10/23/2017, 10:55 AM

## 2017-10-23 NOTE — Care Management Note (Signed)
Case Management Note  Patient Details  Name: Becky Mccall MRN: [id number] Date of Birth: 05/25/1929  Subjective/Objective:                    Action/Plan:  Poke w patient. She states she lives at home by herself and would like [facility] services. She is familiar w [facility] and would like to use them again. Referral for PT RN made to [facility] and accepted.  Patient states she has DME RW at home  And has no other DME needs.  Expected Discharge Date:  10/23/17               Expected Discharge Plan:  [place]  In-House Referral:     Discharge planning Services  CM Consult  Post Acute Care Choice:  Home Health Choice offered to:  Patient  DME Arranged:    DME Agency:     HH Arranged:  PT, RN [place] Agency:  [place]  Status of Service:  Completed, signed off  If discussed at [place] of Stay Meetings, dates discussed:    Additional Comments:  Carles Collet, RN 10/23/2017, 12:31 PM

## 2017-10-23 NOTE — Discharge Instructions (Signed)
Follow with Primary MD [name], MD in 7 days   Get CBC, BMP,  ray checked  by Primary MD  in 5-7 days   Activity: As tolerated with Full fall precautions use walker/cane & assistance as needed  Disposition Home   Diet:  Heart Healthy    For Heart failure patients - Check your Weight same time everyday, if you gain over 2 pounds, or you develop in leg swelling, experience more shortness of breath or chest pain, call your Primary MD immediately. Follow Cardiac Low Salt Diet and 1.5 lit/day fluid restriction.  Special Instructions: If you have smoked or chewed Tobacco  in the last 2 yrs please stop smoking, stop any regular Alcohol  and or any Recreational drug use.  On your next visit with your primary care physician please Get Medicines reviewed and adjusted.  Please request your Prim.MD to go over all Hospital Tests and Procedure/Radiological results at the follow up, please get all Hospital records sent to your Prim MD by signing hospital release before you go home.  If you experience worsening of your admission symptoms, develop shortness of breath, life threatening emergency, suicidal or homicidal thoughts you must seek medical attention immediately by calling 911 or calling your MD immediately  if symptoms less severe.  Do not drive, until you have seen by Primary MD and advised to do so again.

## 2017-10-25 DIAGNOSIS — J449 Chronic obstructive pulmonary disease, unspecified: Secondary | ICD-10-CM | POA: Diagnosis not present

## 2017-10-25 DIAGNOSIS — K219 Gastro-esophageal reflux disease without esophagitis: Secondary | ICD-10-CM | POA: Diagnosis not present

## 2017-10-25 DIAGNOSIS — K589 Irritable bowel syndrome without diarrhea: Secondary | ICD-10-CM | POA: Diagnosis not present

## 2017-10-25 DIAGNOSIS — I2510 Atherosclerotic heart disease of native coronary artery without angina pectoris: Secondary | ICD-10-CM | POA: Diagnosis not present

## 2017-10-25 DIAGNOSIS — R42 Dizziness and giddiness: Secondary | ICD-10-CM | POA: Diagnosis not present

## 2017-10-25 DIAGNOSIS — N182 Chronic kidney disease, stage 2 (mild): Secondary | ICD-10-CM | POA: Diagnosis not present

## 2017-10-25 DIAGNOSIS — E039 Hypothyroidism, unspecified: Secondary | ICD-10-CM | POA: Diagnosis not present

## 2017-10-25 DIAGNOSIS — Z7982 Long term (current) use of aspirin: Secondary | ICD-10-CM | POA: Diagnosis not present

## 2017-10-25 DIAGNOSIS — Z87891 Personal history of nicotine dependence: Secondary | ICD-10-CM | POA: Diagnosis not present

## 2017-10-25 DIAGNOSIS — H8110 Benign paroxysmal vertigo, unspecified ear: Secondary | ICD-10-CM | POA: Diagnosis not present

## 2017-10-25 DIAGNOSIS — I129 Hypertensive chronic kidney disease with stage 1 through stage 4 chronic kidney disease, or unspecified chronic kidney disease: Secondary | ICD-10-CM | POA: Diagnosis not present

## 2017-10-28 DIAGNOSIS — H43813 Vitreous degeneration, bilateral: Secondary | ICD-10-CM | POA: Diagnosis not present

## 2017-10-28 DIAGNOSIS — H35033 Hypertensive retinopathy, bilateral: Secondary | ICD-10-CM | POA: Diagnosis not present

## 2017-11-02 DIAGNOSIS — H903 Sensorineural hearing loss, bilateral: Secondary | ICD-10-CM | POA: Diagnosis not present

## 2017-11-02 DIAGNOSIS — H8110 Benign paroxysmal vertigo, unspecified ear: Secondary | ICD-10-CM | POA: Diagnosis not present

## 2017-11-03 DIAGNOSIS — Z23 Encounter for immunization: Secondary | ICD-10-CM | POA: Diagnosis not present

## 2017-11-03 DIAGNOSIS — B351 Tinea unguium: Secondary | ICD-10-CM | POA: Diagnosis not present

## 2017-11-03 DIAGNOSIS — H8110 Benign paroxysmal vertigo, unspecified ear: Secondary | ICD-10-CM | POA: Diagnosis not present

## 2017-11-16 ENCOUNTER — Ambulatory Visit: Payer: Medicare Other | Admitting: Cardiology

## 2017-11-22 DIAGNOSIS — H903 Sensorineural hearing loss, bilateral: Secondary | ICD-10-CM | POA: Diagnosis not present

## 2017-12-06 NOTE — Progress Notes (Signed)
[phone] [place]                                       Cardiology Office Note    Date:  12/08/2017   ID:  Becky Mccall, DOB Apr 26, 1929, MRN [id number]  PCP:  [name], MD  Cardiologist: Dr. [name] / Dr Burt Knack & Dr. Roxy Manns (TAVR)  CC: 1 year s/p TAVR   History of Present Illness:  Becky Mccall is a 84 y.o. female with a history of HTN, chronic diastolic CHF, COPD, hypothyroidism, GE reflux, HLD, chronic dizziness and severe aortic stenosiss/p TAVR (12/07/16) who presents to clinic for follow up.  Earlier this year while undergoing workup for knee surgery she was found to have echo 10/2016 showing severe AS. [facility] 11/15/16 showing normal LVEDP, mild nonobstructive CAD (30% prox RCA, 40% ostial RPDA, 45% D1, 45% dLAD).  She ultimately underwentsuccessful TAVR with a 26 mm Edwards Sapien 3 THV placed via open left transcarotid approach. Post operative echo showed good valve placement with no PVL. She was severely deconditioned and discharged to a SNF. She was discharged onASA/plavix.  1 month echo showed EF 55-60% and normally functioning TAVR valve with mean/peak gradient 11 and 22 mm Hg respectively.  She has been seen in the clinic for ongoing dizziness, which has been a chronic issue for her. Heart monitor in 02/2017 showed NSR with an ave HR of 73 bpm.   She was admitted in 10/2017 for BPPV. She had positive nystagmus. MRI brain and MRA head and neck were unremarkable. She had improvement with meclizine and valium   Today she presents to clinic for follow up. No CP or SOB. No LE edema, orthopnea or PND. No syncope. No blood in stool or urine. No palpitations.  She has intermittent nystagmus and chronic dizziness, which is unchanged.   Past Medical History:  Diagnosis Date  . Brain aneurysm   . CAD in native artery    a.  [facility] 11/15/16 showing normal LVEDP, mild nonobstructive CAD (30% prox RCA, 40%  ostial RPDA, 45% D1, 45% dLAD), severe AS with mean gradient 40.36mmHg, AVA 0.76cm2.   . Carotid stenosis    a. 1-39% by duplex 2018.  Marland Kitchen Cervical spondylolysis   . Chronic lower back pain   . CKD (chronic kidney disease), stage II   . COPD (chronic obstructive pulmonary disease) ([place])    "mild" (12/09/2016)  . DDD (degenerative disc disease), lumbar   . GERD (gastroesophageal reflux disease)   . Hard of hearing    wears hearing aides both ears  . Hyperlipidemia    can't take the meds so takes Fish Oil  . Hypertension   . Hypothyroidism   . IBS (irritable bowel syndrome)   . Intermittent dysphagia    for pills and solids  . Osteopenia   . S/P TAVR (transcatheter aortic valve replacement) 12/07/2016   26 mm Edwards Sapien 3 transcatheter heart valve placed via open left transcarotid approach   . Severe aortic stenosis    a. s/p TAVR 11/2016.  Marland Kitchen Vertigo     Past Surgical History:  Procedure Laterality Date  . ABDOMINAL HYSTERECTOMY    . APPENDECTOMY    . BACK SURGERY  2009   "on her lower back; not sure what they did" (12/09/2016)  . BLEPHAROPLASTY Bilateral   . CARDIAC CATHETERIZATION    .  CARDIAC VALVE REPLACEMENT  12/07/2016   Edwards Sapien 3 THV (size 26 mm, model # 9600TFX, serial # [id number]  . CATARACT EXTRACTION W/ INTRAOCULAR LENS  IMPLANT, BILATERAL  1999,2000  . CATARACT EXTRACTION, BILATERAL Bilateral   . COLONOSCOPY W/ BIOPSIES AND POLYPECTOMY  11/2003   Archie Endo 07/08/2010  . EYE SURGERY    . HEMORROIDECTOMY    . LUMBAR LAMINECTOMY  1999  . LUMBAR LAMINECTOMY/DECOMPRESSION MICRODISCECTOMY  09/1999   Archie Endo 07/08/2010   . POSTERIOR FUSION LUMBAR SPINE  2014  . RIGHT/LEFT HEART CATH AND CORONARY ANGIOGRAPHY N/A 11/15/2016   Procedure: RIGHT/LEFT HEART CATH AND CORONARY ANGIOGRAPHY;  Surgeon: [name], MD;  Location: [facility];  Service: Cardiovascular;  Laterality: N/A;  . SKIN GRAFT SPLIT THICKNESS LEG / FOOT Left 09/2000   Archie Endo 07/08/2010  .  TEE WITHOUT CARDIOVERSION N/A 12/07/2016   Procedure: TRANSESOPHAGEAL ECHOCARDIOGRAM (TEE);  Surgeon: [name], MD;  Location: [facility];  Service: Open Heart Surgery;  Laterality: N/A;  . TONSILLECTOMY    . TRANSCATHETER AORTIC VALVE REPLACEMENT, TRANSFEMORAL  12/07/2016   Edwards Sapien 3 THV (size 26 mm, model # 9600TFX, serial # [id number]    Current Medications: Outpatient Medications Prior to Visit  Medication Sig Dispense Refill  . amLODipine (NORVASC) 2.5 MG tablet Take 2.5 mg by mouth daily.    Marland Kitchen aspirin EC 81 MG tablet Take 1 tablet (81 mg total) by mouth daily. 90 tablet 3  . Calcium Carb-Cholecalciferol (CALCIUM 1000 + D PO) Take 1 tablet by mouth daily.     . Cholecalciferol (VITAMIN D) 2000 UNITS tablet Take 6,000 Units by mouth daily.     . Cinnamon 500 MG capsule Take 500 mg by mouth 2 (two) times daily.     Marland Kitchen levothyroxine (SYNTHROID, LEVOTHROID) 137 MCG tablet Take 137 mcg by mouth daily before breakfast.    . meclizine (ANTIVERT) 25 MG tablet Take 1 tablet (25 mg total) by mouth 3 (three) times daily as needed for dizziness. 30 tablet 0  . Multiple Vitamin (MULTIVITAMIN WITH MINERALS) TABS tablet Take 1 tablet by mouth daily.    . Omega-3 Fatty Acids (FISH OIL) 1000 MG CAPS Take 1,000 mg by mouth 2 (two) times daily.    . traMADol (ULTRAM) 50 MG tablet Take 1 tablet (50 mg total) by mouth every 8 (eight) hours as needed (pain not relieved by tylenol). 45 tablet 0  . Turmeric 500 MG CAPS Take 500 mg by mouth 2 (two) times daily.    . vitamin B-12 (CYANOCOBALAMIN) 1000 MCG tablet Take 1,000 mcg by mouth daily.    . vitamin C (ASCORBIC ACID) 500 MG tablet Take 500 mg by mouth daily.    . vitamin E 400 UNIT capsule Take 400 Units by mouth 2 (two) times daily.     . ondansetron (ZOFRAN) 4 MG tablet Take 1 tablet (4 mg total) by mouth every 6 (six) hours as needed for nausea. (Patient not taking: Reported on 12/07/2017) 20 tablet 0   No facility-administered medications prior  to visit.      Allergies:   Lisinopril; Relafen [nabumetone]; Statins; Tape; Welchol [colesevelam]; Zetia [ezetimibe]; Neosporin [neomycin-bacitracin zn-polymyx]; and Zinc oxide   Social History   Socioeconomic History  . Marital status: Widowed    Spouse name: irvin  . Number of children: 2  . Years of education: 10th  . Highest education level: Not on file  Occupational History  . Occupation: retired  Scientific laboratory technician  . Emergency planning/management officer  strain: Not on file  . Food insecurity:    Worry: Not on file    Inability: Not on file  . Transportation needs:    Medical: Not on file    Non-medical: Not on file  Tobacco Use  . Smoking status: Former Smoker    Packs/day: 2.00    Years: 40.00    Pack years: 80.00    Types: Cigarettes    Last attempt to quit: 08/02/1989    Years since quitting: 28.3  . Smokeless tobacco: Never Used  Substance and Sexual Activity  . Alcohol use: Yes    Alcohol/week: 14.0 standard drinks    Types: 7 Standard drinks or equivalent, 7 Glasses of wine per week  . Drug use: No  . Sexual activity: Never    Birth control/protection: Surgical  Lifestyle  . Physical activity:    Days per week: Not on file    Minutes per session: Not on file  . Stress: Not on file  Relationships  . Social connections:    Talks on phone: Not on file    Gets together: Not on file    Attends religious service: Not on file    Active member of club or organization: Not on file    Attends meetings of clubs or organizations: Not on file    Relationship status: Not on file  Other Topics Concern  . Not on file  Social History Narrative   Patient lives at home with her husband   Patient drinks coffee, sodas   Patient has 2 children    Patient is right handed    Patient is retired           Family History:  The patient's family history includes Cancer - Lung in her brother, maternal aunt, and mother; Heart attack in her father.      ROS:   Please see the history of  present illness.    ROS All other systems reviewed and are negative.   PHYSICAL EXAM:   VS:  BP 134/64   Pulse 68   Ht 5\' 6"  (1.676 m)   Wt 171 lb 12.8 oz (77.9 kg)   LMP  (LMP Unknown)   SpO2 93%   BMI 27.73 kg/m    GEN: Well nourished, well developed, in no acute distress HEENT: normal Neck: no JVD or masses Cardiac: RRR; soft flow murmur heard best at the RUSB. No rubs, or gallops,no edema.  Hyperpigmentation of chronic venous stasis. Respiratory:  clear to auscultation bilaterally, normal work of breathing GI: soft, nontender, nondistended, + BS MS: no deformity or atrophy Skin: warm and dry, no rash Neuro:  Alert and Oriented x 3, Strength and sensation are intact Psych: euthymic mood, full affect   Wt Readings from Last 3 Encounters:  12/07/17 171 lb 12.8 oz (77.9 kg)  10/22/17 161 lb 2.5 oz (73.1 kg)  10/11/17 165 lb 6.4 oz (75 kg)      Studies/Labs Reviewed:   EKG:  EKG is not ordered today.   Recent Labs: 10/11/2017: Magnesium 2.5 10/21/2017: BUN 13; Creatinine, Ser 1.06; Hemoglobin 13.5; Platelets 174; Potassium 4.2; Sodium 140   Lipid Panel    Component Value Date/Time   CHOL 226 (H) 05/05/2013 0630   TRIG 149 05/05/2013 0630   HDL 51 05/05/2013 0630   CHOLHDL 4.4 05/05/2013 0630   VLDL 30 05/05/2013 0630   LDLCALC 145 (H) 05/05/2013 0630    Additional studies/ records that were reviewed today include:  TAVR OPERATIVE  NOTE   Date of Procedure:12/07/2016  Preoperative Diagnosis:Severe Aortic Stenosis  Procedure: Transcatheter Aortic Valve Replacement - Left carotid approach Edwards Sapien 3 THV (size 37mm, model # 9600TFX, serial # [id number])  Co-Surgeons:Clarence H. Roxy Manns, MD Dominga Ferry, MD  Pre-operative Echo Findings: ? Severesevere aortic stenosis ? Normalleft ventricular systolic function  Post-operative Echo Findings: ? Traceparavalvular leak ? Normalleft  ventricular systolic function   _____________  2D ECHO 12/30/16 ( 1 month s/p TAVR) Study Conclusions - Left ventricle: The cavity size was normal. There was mild concentric hypertrophy. Systolic function was normal. The estimated ejection fraction was in the range of 55% to 60%. Wall motion was normal; there were no regional wall motion abnormalities. Features are consistent with a pseudonormal left ventricular filling pattern, with concomitant abnormal relaxation and increased filling pressure (grade 2 diastolic dysfunction). - Aortic valve: A TAVR stent-valve bioprosthesis was present and functioning normally. - Mitral valve: Calcified annulus. Valve area by continuity equation (using LVOT flow): 1.67 cm^2. - Left atrium: The atrium was mildly dilated  _____________  Echo 12/07/17 (1 year s/p TAVR) Study Conclusions  - Left ventricle: The cavity size was normal. Wall thickness was   increased in a pattern of mild LVH. Systolic function was normal.   The estimated ejection fraction was in the range of 60% to 65%.   Wall motion was normal; there were no regional wall motion   abnormalities. Doppler parameters are consistent with abnormal   left ventricular relaxation (grade 1 diastolic dysfunction). - Aortic valve: Bioprosthetic aortic valve s/p TAVR. No significant   bioprosthetic valve stenosis. There was no regurgitation. Mean   gradient (S): 12 mm Hg. - Mitral valve: Mildly calcified annulus. There was trivial   regurgitation. - Left atrium: The atrium was moderately dilated. - Right ventricle: The cavity size was normal. Systolic function   was normal. - Tricuspid valve: Peak RV-RA gradient (S): 29 mm Hg. - Pulmonary arteries: PA peak pressure: 32 mm Hg (S). - Inferior vena cava: The vessel was normal in size. The   respirophasic diameter changes were in the normal range (>= 50%),   consistent with normal central venous pressure. Impressions: -  Normal LV size with mild LV hypertrophy. EF 60-65%. Normal RV   size and systolic function. Bioprosthetic aortic valve s/p TAVR,   normally functioning valve. Normal RV size and systolic function.   ASSESSMENT & PLAN:   Severe AS s/p TAVR:Echo today shows EF 60%, normally functioning TAVR valve with no PVL and mean gradient of 12 mmHg.  She has NYHA class I symptoms.  She is mostly limited by knee pain, and seriously thinking about getting her knee replaced.  I have told her that she has no contraindications to this from a cardiac standpoint.  She will continue on aspirin 81 mg daily.  SBE prophylaxis discussed.  She wears dentures and does not go to the dentist.  HTN:BP well controlled.  Review of home log shows some labile blood pressures, but most are in good range.  Have not made any changes to her regimen.  Chronic diastolic CHF:She appears euvolemic off of diuretics.  Vertigo: This is chronic for her and stable.  She has meclizine.  Medication Adjustments/Labs and Tests Ordered: Current medicines are reviewed at length with the patient today.  Concerns regarding medicines are outlined above.  Medication changes, Labs and Tests ordered today are listed in the Patient Instructions below. Patient Instructions  Medication Instructions:  Your provider recommends that you continue on your  current medications as directed. Please refer to the Current Medication list given to you today.    Labwork: None  Testing/Procedures: None  Follow-Up: You have an appointment with Dr. [name] on 12/4.   Your provider recommends that you schedule a follow-up appointment AS NEEDED with the Structural Heart Team!  Any Other Special Instructions Will Be Listed Below (If Applicable).     If you need a refill on your cardiac medications before your next appointment, please call your pharmacy.      Signed, Angelena Form, PA-C  12/08/2017 11:12 AM    [facility] [address], [place], [place]  [address] Phone: [phone]; Fax: [phone]

## 2017-12-07 ENCOUNTER — Encounter: Payer: Self-pay | Admitting: Physician Assistant

## 2017-12-07 ENCOUNTER — Other Ambulatory Visit: Payer: Self-pay

## 2017-12-07 ENCOUNTER — Ambulatory Visit: Payer: Medicare Other | Admitting: Physician Assistant

## 2017-12-07 ENCOUNTER — Ambulatory Visit (HOSPITAL_COMMUNITY): Payer: Medicare Other | Attending: Cardiology

## 2017-12-07 VITALS — BP 134/64 | HR 68 | Ht 66.0 in | Wt 171.8 lb

## 2017-12-07 DIAGNOSIS — Z952 Presence of prosthetic heart valve: Secondary | ICD-10-CM | POA: Insufficient documentation

## 2017-12-07 DIAGNOSIS — R42 Dizziness and giddiness: Secondary | ICD-10-CM

## 2017-12-07 DIAGNOSIS — I5032 Chronic diastolic (congestive) heart failure: Secondary | ICD-10-CM | POA: Diagnosis not present

## 2017-12-07 DIAGNOSIS — I10 Essential (primary) hypertension: Secondary | ICD-10-CM

## 2017-12-07 NOTE — Patient Instructions (Signed)
Medication Instructions:  Your provider recommends that you continue on your current medications as directed. Please refer to the Current Medication list given to you today.    Labwork: None  Testing/Procedures: None  Follow-Up: You have an appointment with Dr. [name] on 12/4.   Your provider recommends that you schedule a follow-up appointment AS NEEDED with the Structural Heart Team!  Any Other Special Instructions Will Be Listed Below (If Applicable).     If you need a refill on your cardiac medications before your next appointment, please call your pharmacy.

## 2017-12-14 ENCOUNTER — Other Ambulatory Visit: Payer: Self-pay

## 2017-12-14 ENCOUNTER — Encounter (HOSPITAL_COMMUNITY): Payer: Self-pay

## 2017-12-14 ENCOUNTER — Observation Stay (HOSPITAL_COMMUNITY)
Admission: EM | Admit: 2017-12-14 | Discharge: 2017-12-15 | Disposition: A | Discharge status: Home / Self Care | Payer: Medicare Other | Attending: Internal Medicine | Admitting: Internal Medicine

## 2017-12-14 DIAGNOSIS — Z87891 Personal history of nicotine dependence: Secondary | ICD-10-CM | POA: Insufficient documentation

## 2017-12-14 DIAGNOSIS — I129 Hypertensive chronic kidney disease with stage 1 through stage 4 chronic kidney disease, or unspecified chronic kidney disease: Secondary | ICD-10-CM | POA: Insufficient documentation

## 2017-12-14 DIAGNOSIS — K589 Irritable bowel syndrome without diarrhea: Secondary | ICD-10-CM | POA: Diagnosis not present

## 2017-12-14 DIAGNOSIS — I6529 Occlusion and stenosis of unspecified carotid artery: Secondary | ICD-10-CM | POA: Insufficient documentation

## 2017-12-14 DIAGNOSIS — N182 Chronic kidney disease, stage 2 (mild): Secondary | ICD-10-CM | POA: Insufficient documentation

## 2017-12-14 DIAGNOSIS — M5136 Other intervertebral disc degeneration, lumbar region: Secondary | ICD-10-CM | POA: Insufficient documentation

## 2017-12-14 DIAGNOSIS — R42 Dizziness and giddiness: Secondary | ICD-10-CM | POA: Diagnosis not present

## 2017-12-14 DIAGNOSIS — G4733 Obstructive sleep apnea (adult) (pediatric): Secondary | ICD-10-CM | POA: Insufficient documentation

## 2017-12-14 DIAGNOSIS — R0902 Hypoxemia: Secondary | ICD-10-CM | POA: Diagnosis not present

## 2017-12-14 DIAGNOSIS — I2510 Atherosclerotic heart disease of native coronary artery without angina pectoris: Secondary | ICD-10-CM | POA: Diagnosis not present

## 2017-12-14 DIAGNOSIS — M545 Low back pain: Secondary | ICD-10-CM | POA: Insufficient documentation

## 2017-12-14 DIAGNOSIS — J449 Chronic obstructive pulmonary disease, unspecified: Secondary | ICD-10-CM | POA: Insufficient documentation

## 2017-12-14 DIAGNOSIS — E039 Hypothyroidism, unspecified: Secondary | ICD-10-CM | POA: Insufficient documentation

## 2017-12-14 DIAGNOSIS — G8929 Other chronic pain: Secondary | ICD-10-CM | POA: Insufficient documentation

## 2017-12-14 DIAGNOSIS — K219 Gastro-esophageal reflux disease without esophagitis: Secondary | ICD-10-CM | POA: Insufficient documentation

## 2017-12-14 DIAGNOSIS — Z952 Presence of prosthetic heart valve: Secondary | ICD-10-CM | POA: Diagnosis not present

## 2017-12-14 DIAGNOSIS — R1111 Vomiting without nausea: Secondary | ICD-10-CM | POA: Diagnosis not present

## 2017-12-14 DIAGNOSIS — E785 Hyperlipidemia, unspecified: Secondary | ICD-10-CM | POA: Diagnosis not present

## 2017-12-14 DIAGNOSIS — Z8249 Family history of ischemic heart disease and other diseases of the circulatory system: Secondary | ICD-10-CM | POA: Diagnosis not present

## 2017-12-14 DIAGNOSIS — Z7982 Long term (current) use of aspirin: Secondary | ICD-10-CM | POA: Diagnosis not present

## 2017-12-14 DIAGNOSIS — M8580 Other specified disorders of bone density and structure, unspecified site: Secondary | ICD-10-CM | POA: Insufficient documentation

## 2017-12-14 DIAGNOSIS — I10 Essential (primary) hypertension: Secondary | ICD-10-CM | POA: Diagnosis not present

## 2017-12-14 LAB — URINALYSIS, ROUTINE W REFLEX MICROSCOPIC
Bacteria, UA: NONE SEEN
Bilirubin Urine: NEGATIVE
GLUCOSE, UA: NEGATIVE mg/dL
Hgb urine dipstick: NEGATIVE
Ketones, ur: NEGATIVE mg/dL
Nitrite: NEGATIVE
Protein, ur: NEGATIVE mg/dL
SPECIFIC GRAVITY, URINE: 1.011 (ref 1.005–1.030)
pH: 7 (ref 5.0–8.0)

## 2017-12-14 LAB — BASIC METABOLIC PANEL
ANION GAP: 10 (ref 5–15)
BUN: 17 mg/dL (ref 8–23)
CALCIUM: 8.8 mg/dL — AB (ref 8.9–10.3)
CO2: 26 mmol/L (ref 22–32)
Chloride: 104 mmol/L (ref 98–111)
Creatinine, Ser: 0.93 mg/dL (ref 0.44–1.00)
GFR calc Af Amer: >60 mL/min (ref >60)
GFR, EST NON AFRICAN AMERICAN: 54 mL/min — AB (ref >60)
GLUCOSE: 135 mg/dL — AB (ref 70–99)
Potassium: 3.7 mmol/L (ref 3.5–5.1)
SODIUM: 140 mmol/L (ref 135–145)

## 2017-12-14 LAB — CBC
HCT: 44.1 % (ref 36.0–46.0)
Hemoglobin: 14 g/dL (ref 12.0–15.0)
MCH: 30 pg (ref 26.0–34.0)
MCHC: 31.7 g/dL (ref 30.0–36.0)
MCV: 94.4 fL (ref 80.0–100.0)
Platelets: 170 10*3/uL (ref 150–400)
RBC: 4.67 MIL/uL (ref 3.87–5.11)
RDW: 13.7 % (ref 11.5–15.5)
WBC: 4.7 10*3/uL (ref 4.0–10.5)
nRBC: 0 % (ref 0.0–0.2)

## 2017-12-14 LAB — CBG MONITORING, ED: Glucose-Capillary: 124 mg/dL — ABNORMAL HIGH (ref 70–99)

## 2017-12-14 MED ORDER — SODIUM CHLORIDE 0.9 % IV BOLUS
500.0000 mL | Freq: Once | INTRAVENOUS | Status: AC
  Administered 2017-12-14: 500 mL via INTRAVENOUS

## 2017-12-14 MED ORDER — ONDANSETRON HCL 4 MG/2ML IJ SOLN: 4.0000 mg | Freq: Four times a day (QID) | INTRAMUSCULAR | Status: DC | PRN

## 2017-12-14 MED ORDER — SCOPOLAMINE 1 MG/3DAYS TD PT72
1.0000 | MEDICATED_PATCH | TRANSDERMAL | Status: DC
  Administered 2017-12-14: 1.5 mg via TRANSDERMAL
  Filled 2017-12-14: qty 1

## 2017-12-14 MED ORDER — ONDANSETRON HCL 4 MG/2ML IJ SOLN
4.0000 mg | Freq: Once | INTRAMUSCULAR | Status: AC
  Administered 2017-12-14: 4 mg via INTRAVENOUS
  Filled 2017-12-14: qty 2

## 2017-12-14 MED ORDER — MECLIZINE HCL 12.5 MG PO TABS: 12.5000 mg | ORAL_TABLET | Freq: Three times a day (TID) | ORAL | 0 refills | Status: DC | PRN

## 2017-12-14 MED ORDER — LORAZEPAM 2 MG/ML IJ SOLN
0.5000 mg | Freq: Once | INTRAMUSCULAR | Status: AC
  Administered 2017-12-14: 0.5 mg via INTRAVENOUS
  Filled 2017-12-14: qty 1

## 2017-12-14 MED ORDER — ENOXAPARIN SODIUM 40 MG/0.4ML ~~LOC~~ SOLN
40.0000 mg | SUBCUTANEOUS | Status: DC
  Administered 2017-12-14: 40 mg via SUBCUTANEOUS
  Filled 2017-12-14: qty 0.4

## 2017-12-14 MED ORDER — LEVOTHYROXINE SODIUM 137 MCG PO TABS
137.0000 ug | ORAL_TABLET | Freq: Every day | ORAL | Status: DC
  Administered 2017-12-15: 137 ug via ORAL
  Filled 2017-12-14: qty 1

## 2017-12-14 MED ORDER — SODIUM CHLORIDE 0.9 % IV SOLN
INTRAVENOUS | Status: DC
  Administered 2017-12-14 – 2017-12-15 (×2): via INTRAVENOUS

## 2017-12-14 MED ORDER — ASPIRIN EC 81 MG PO TBEC
81.0000 mg | DELAYED_RELEASE_TABLET | Freq: Every day | ORAL | Status: DC
  Administered 2017-12-14 – 2017-12-15 (×2): 81 mg via ORAL
  Filled 2017-12-14 (×2): qty 1

## 2017-12-14 MED ORDER — LORAZEPAM 2 MG/ML IJ SOLN: 0.5000 mg | Freq: Four times a day (QID) | INTRAMUSCULAR | Status: DC | PRN

## 2017-12-14 MED ORDER — MECLIZINE HCL 25 MG PO TABS
25.0000 mg | ORAL_TABLET | Freq: Three times a day (TID) | ORAL | Status: DC
  Administered 2017-12-14 – 2017-12-15 (×3): 25 mg via ORAL
  Filled 2017-12-14 (×3): qty 1

## 2017-12-14 MED ORDER — AMLODIPINE BESYLATE 5 MG PO TABS
2.5000 mg | ORAL_TABLET | Freq: Every day | ORAL | Status: DC
  Administered 2017-12-14 – 2017-12-15 (×2): 2.5 mg via ORAL
  Filled 2017-12-14 (×2): qty 1

## 2017-12-14 MED ORDER — MECLIZINE HCL 25 MG PO TABS
25.0000 mg | ORAL_TABLET | Freq: Once | ORAL | Status: AC
  Administered 2017-12-14: 25 mg via ORAL
  Filled 2017-12-14: qty 1

## 2017-12-14 NOTE — Discharge Instructions (Signed)
Take meclizine as directed.  Make sure you are staying hydrated and drink plenty of fluids.  Please follow-up with your primary care doctor.  Return to emergency department for any worsening dizziness, vomiting, chest pain, difficulty breathing, difficulty speaking, numbness/weakness of your arms or legs or any other worsening or concerning symptoms.

## 2017-12-14 NOTE — H&P (Signed)
History and Physical    Becky Mccall MEQ:[id number] DOB: 1930/02/16 DOA: 12/14/2017  PCP: [name], MD Patient coming from:   Chief Complaint: Dizziness  HPI: Becky Mccall is a 84 y.o. female with medical history significant of chronic vertigo, history of brain aneurysm admitted with vertigo where she reports at room spinning around her associated with intractable nausea and vomiting.  Patient lives at home alone.  She called her family early in the morning with the symptoms and they brought her to the ER.  Patient takes meclizine at home on a daily basis she reports she took all her doses yesterday.  She denies any abdominal pain diarrhea fever chills chest pain shortness of breath cough or urinary complaints.  History was obtained from the patient's son and the patient though she was half asleep after receiving Ativan Patient was unable to take meclizine due to nausea and vomiting in the ER.  The initial plan was to send her home after IV hydration but patient was unable to ambulate to the restroom with a 2 person assist to obtain a urine sample because of dizziness.  Denies any dysphagia dysarthria headaches changes with the vision.  Patient was admitted with similar complaints 10/21/2017 and discharged on 10/23/2017.  Her work-up at that time included MRI of the brain and MRA of the head and neck which did not reveal any acute changes.  She was supposed to follow-up with ENT as an outpatient but the family member who was in the room does not know if she did follow-up with ENT.  She was told not to drive until vertigo resolves. ED Course: Labs on admission sodium 140 potassium 3.7 BUN 17 creatinine 0.93 WBC count 4.7 hemoglobin 14 platelet count 170 she received Ativan, Zofran, Antivert though she was not able to take the Antivert.  Her blood pressure was 1 44/67 pulse was 74 she was afebrile.  Review of Systems: See HPI   Past Medical History:  Diagnosis Date  . Brain aneurysm     . CAD in native artery    a.  [facility] 11/15/16 showing normal LVEDP, mild nonobstructive CAD (30% prox RCA, 40% ostial RPDA, 45% D1, 45% dLAD), severe AS with mean gradient 40.42mmHg, AVA 0.76cm2.   . Carotid stenosis    a. 1-39% by duplex 2018.  Marland Kitchen Cervical spondylolysis   . Chronic lower back pain   . CKD (chronic kidney disease), stage II   . COPD (chronic obstructive pulmonary disease) ([place])    "mild" (12/09/2016)  . DDD (degenerative disc disease), lumbar   . GERD (gastroesophageal reflux disease)   . Hard of hearing    wears hearing aides both ears  . Hyperlipidemia    can't take the meds so takes Fish Oil  . Hypertension   . Hypothyroidism   . IBS (irritable bowel syndrome)   . Intermittent dysphagia    for pills and solids  . Osteopenia   . S/P TAVR (transcatheter aortic valve replacement) 12/07/2016   26 mm Edwards Sapien 3 transcatheter heart valve placed via open left transcarotid approach   . Severe aortic stenosis    a. s/p TAVR 11/2016.  Marland Kitchen Vertigo     Past Surgical History:  Procedure Laterality Date  . ABDOMINAL HYSTERECTOMY    . APPENDECTOMY    . BACK SURGERY  2009   "on her lower back; not sure what they did" (12/09/2016)  . BLEPHAROPLASTY Bilateral   . CARDIAC CATHETERIZATION    . CARDIAC VALVE  REPLACEMENT  12/07/2016   Edwards Sapien 3 THV (size 26 mm, model # 9600TFX, serial # [id number]  . CATARACT EXTRACTION W/ INTRAOCULAR LENS  IMPLANT, BILATERAL  1999,2000  . CATARACT EXTRACTION, BILATERAL Bilateral   . COLONOSCOPY W/ BIOPSIES AND POLYPECTOMY  11/2003   Archie Endo 07/08/2010  . EYE SURGERY    . HEMORROIDECTOMY    . LUMBAR LAMINECTOMY  1999  . LUMBAR LAMINECTOMY/DECOMPRESSION MICRODISCECTOMY  09/1999   Archie Endo 07/08/2010   . POSTERIOR FUSION LUMBAR SPINE  2014  . RIGHT/LEFT HEART CATH AND CORONARY ANGIOGRAPHY N/A 11/15/2016   Procedure: RIGHT/LEFT HEART CATH AND CORONARY ANGIOGRAPHY;  Surgeon: [name], MD;  Location: [place];  Service:  Cardiovascular;  Laterality: N/A;  . SKIN GRAFT SPLIT THICKNESS LEG / FOOT Left 09/2000   Archie Endo 07/08/2010  . TEE WITHOUT CARDIOVERSION N/A 12/07/2016   Procedure: TRANSESOPHAGEAL ECHOCARDIOGRAM (TEE);  Surgeon: [name], MD;  Location: [facility];  Service: Open Heart Surgery;  Laterality: N/A;  . TONSILLECTOMY    . TRANSCATHETER AORTIC VALVE REPLACEMENT, TRANSFEMORAL  12/07/2016   Edwards Sapien 3 THV (size 26 mm, model # 9600TFX, serial # [id number]     reports that she quit smoking about 28 years ago. Her smoking use included cigarettes. She has a 80.00 pack-year smoking history. She has never used smokeless tobacco. She reports that she drinks about 14.0 standard drinks of alcohol per week. She reports that she does not use drugs.  Allergies  Allergen Reactions  . Lisinopril Cough  . Relafen [Nabumetone] Other (See Comments)    Unsure of exact reaction.  . Statins     Muscle pain  . Tape     Developed blisters from clear tape used over cath site 10/2016  . Welchol [Colesevelam] Other (See Comments)    Muscle pain  . Zetia [Ezetimibe] Other (See Comments)    Muscle pain.  . Neosporin [Neomycin-Bacitracin Zn-Polymyx] Other (See Comments)    Cause wound/scratch to worsen  . Zinc Oxide Rash    Family History  Problem Relation Age of Onset  . Cancer - Lung Mother   . Heart attack Father   . Cancer - Lung Brother   . Cancer - Lung Maternal Aunt    Unacceptable: Noncontributory, unremarkable, or negative. Acceptable: Family history reviewed and not pertinent (If you reviewed it)  Prior to Admission medications   Medication Sig Start Date End Date Taking? Authorizing Provider  amLODipine (NORVASC) 2.5 MG tablet Take 2.5 mg by mouth daily.   Yes [provider]  aspirin EC 81 MG tablet Take 1 tablet (81 mg total) by mouth daily. 11/10/16  Yes Dunn, Dayna N, PA-C  Calcium Carb-Cholecalciferol (CALCIUM 1000 + D PO) Take 1 tablet by mouth daily.    Yes [provider]  Cholecalciferol (VITAMIN D) 2000 UNITS tablet Take 6,000 Units by mouth daily.    Yes [provider]  Cinnamon 500 MG capsule Take 500 mg by mouth 2 (two) times daily.    Yes [provider]  levothyroxine (SYNTHROID, LEVOTHROID) 137 MCG tablet Take 137 mcg by mouth daily before breakfast.   Yes [provider]  Multiple Vitamin (MULTIVITAMIN WITH MINERALS) TABS tablet Take 1 tablet by mouth daily.   Yes [provider]  Omega-3 Fatty Acids (FISH OIL) 1000 MG CAPS Take 1,000 mg by mouth 2 (two) times daily.   Yes [provider]  tiotropium (SPIRIVA) 18 MCG inhalation capsule Place 18 mcg into inhaler and inhale daily.  Yes [provider]  traMADol (ULTRAM) 50 MG tablet Take 1 tablet (50 mg total) by mouth every 8 (eight) hours as needed (pain not relieved by tylenol). 05/05/13  Yes Barton Dubois, MD  Turmeric 500 MG CAPS Take 500 mg by mouth 2 (two) times daily.   Yes [provider]  vitamin B-12 (CYANOCOBALAMIN) 1000 MCG tablet Take 1,000 mcg by mouth daily.   Yes [provider]  vitamin C (ASCORBIC ACID) 500 MG tablet Take 500 mg by mouth daily.   Yes [provider]  vitamin E 400 UNIT capsule Take 400 Units by mouth 2 (two) times daily.    Yes [provider]  meclizine (ANTIVERT) 12.5 MG tablet Take 1 tablet (12.5 mg total) by mouth 3 (three) times daily as needed for dizziness. 12/14/17   Volanda Napoleon, PA-C    Physical Exam: Vitals:   12/14/17 1130 12/14/17 1242 12/14/17 1300 12/14/17 1330  BP: 136/68 (!) 141/71 (!) 156/66 (!) 144/67  Pulse: 79 77 (!) 37 74  Resp: 16 18 16 16   SpO2: 97% 96% 94% 95%  Weight:      Height:        Constitutional: NAD, calm, comfortable Vitals:   12/14/17 1130 12/14/17 1242 12/14/17 1300 12/14/17 1330  BP: 136/68 (!) 141/71 (!) 156/66 (!) 144/67  Pulse: 79 77 (!) 37 74  Resp: 16 18 16 16   SpO2: 97% 96% 94% 95%  Weight:      Height:       Eyes:  PERRL, lids and conjunctivae normal ENMT: Mucous membranes are DRY  Posterior pharynx clear of any exudate or lesions.Normal dentition.  Neck: normal, supple, no masses, no thyromegaly Respiratory: clear to auscultation bilaterally, no wheezing, no crackles. Normal respiratory effort. No accessory muscle use.  Cardiovascular: Regular rate and rhythm, no murmurs / rubs / gallops. No extremity edema. 2+ pedal pulses. No carotid bruits.  Abdomen: no tenderness, no masses palpated. No hepatosplenomegaly. Bowel sounds positive.  Musculoskeletal: no clubbing / cyanosis. No joint deformity upper and lower extremities. Good ROM, no contractures. Normal muscle tone.  Skin: no rashes, lesions, ulcers. No induration Neurologic: CN 2-12 grossly intact. Sensation intact, DTR normal. Strength 5/5 in all 4.  Psychiatric: Normal judgment and insight. Alert and oriented x 3. Normal mood.   (Anything < 9 systems with 2 bullets each down codes to level 1) (If patient refuses exam can't bill higher level) (Make sure to document decubitus ulcers present on admission -- if possible -- and whether patient has chronic indwelling catheter at time of admission)  Labs on Admission: I have personally reviewed following labs and imaging studies  CBC: Recent Labs  Lab 12/14/17 0443  WBC 4.7  HGB 14.0  HCT 44.1  MCV 94.4  PLT 518   Basic Metabolic Panel: Recent Labs  Lab 12/14/17 0443  NA 140  K 3.7  CL 104  CO2 26  GLUCOSE 135*  BUN 17  CREATININE 0.93  CALCIUM 8.8*   GFR: Estimated Creatinine Clearance: 45.5 mL/min (by C-G formula based on SCr of 0.93 mg/dL). Liver Function Tests: No results for input(s): AST, ALT, ALKPHOS, BILITOT, PROT, ALBUMIN in the last 168 hours. No results for input(s): LIPASE, AMYLASE in the last 168 hours. No results for input(s): AMMONIA in the last 168 hours. Coagulation Profile: No results for input(s): INR, PROTIME in the last 168 hours. Cardiac Enzymes: No results  for input(s): CKTOTAL, CKMB, CKMBINDEX, TROPONINI in the last 168 hours. BNP (last  3 results) No results for input(s): PROBNP in the last 8760 hours. HbA1C: No results for input(s): HGBA1C in the last 72 hours. CBG: Recent Labs  Lab 12/14/17 0441  GLUCAP 124*   Lipid Profile: No results for input(s): CHOL, HDL, LDLCALC, TRIG, CHOLHDL, LDLDIRECT in the last 72 hours. Thyroid Function Tests: No results for input(s): TSH, T4TOTAL, FREET4, T3FREE, THYROIDAB in the last 72 hours. Anemia Panel: No results for input(s): VITAMINB12, FOLATE, FERRITIN, TIBC, IRON, RETICCTPCT in the last 72 hours. Urine analysis:    Component Value Date/Time   COLORURINE YELLOW 12/14/2017 0945   APPEARANCEUR CLEAR 12/14/2017 0945   LABSPEC 1.011 12/14/2017 0945   PHURINE 7.0 12/14/2017 0945   GLUCOSEU NEGATIVE 12/14/2017 0945   HGBUR NEGATIVE 12/14/2017 0945   BILIRUBINUR NEGATIVE 12/14/2017 0945   KETONESUR NEGATIVE 12/14/2017 0945   PROTEINUR NEGATIVE 12/14/2017 0945   UROBILINOGEN 0.2 05/04/2013 0328   NITRITE NEGATIVE 12/14/2017 0945   LEUKOCYTESUR SMALL (A) 12/14/2017 0945    Radiological Exams on Admission: No results found.  EKG: Independently reviewed.   Assessment/Plan Active Problems:   Vertigo   #1 acute exacerbation of chronic vertigo-unclear etiology.  Patient was admitted end of August with a similar complaints and was discharged home on symptomatic agents and to asked her to follow-up with the ENT patient was also seen by physical therapy.  She is admitted with similar complaints again this admission.  I will obtain a PT evaluation treat her symptomatically with Zofran, Ativan, and meclizine.  I have not repeated imaging of her brain.  Patient has no signs or symptoms of stroke at this time.  No dysphagia or dysarthria reported by the family.  #2 history of hypertension orthostatic was not checked in the ER.  Continue Norvasc.  #3 history of hypothyroidism continue  Synthroid.   DVT prophylaxis: Lovenox Code Status: Full code Family Communication: Son in the room Disposition Plan: Pending and clinical improvement consults called: None admission status: Georgette Shell MD [facility] If 7PM-7AM, please contact night-coverage [email or web address] Password [email or web address]  12/14/2017, 2:19 PM

## 2017-12-14 NOTE — ED Notes (Signed)
ED TO INPATIENT HANDOFF REPORT  Name/Age/Gender Becky Mccall 84 y.o. female  Code Status    Code Status Orders  (From admission, onward)         Start     Ordered   12/14/17 1419  Full code  Continuous     12/14/17 1418        Code Status History    Date Active Date Inactive Code Status Order ID Comments User Context   10/22/2017 0557 10/23/2017 1750 Full Code [id number]  Vianne Bulls, MD ED   12/07/2016 1658 12/13/2016 1813 Full Code [id number]  [name], MD Inpatient   11/15/2016 1452 11/15/2016 2006 Full Code [id number]  [name], MD Inpatient   05/04/2013 0633 05/05/2013 1419 Full Code [id number]  Berle Mull, MD ED   02/19/2013 1531 02/25/2013 1413 Full Code [id number]  Elaina Hoops, MD Inpatient      Home/SNF/Other Home  Chief Complaint dizziness  Level of Care/Admitting Diagnosis ED Disposition    ED Disposition Condition [facility]: [facility] [[id number]]  Level of Care: Med-Surg [16]  Diagnosis: Vertigo [712197]  Admitting Physician: Georgette Shell [[id number]]  Attending Physician: Georgette Shell [[id number]]  PT Class (Do Not Modify): Observation [104]  PT Acc Code (Do Not Modify): Observation [10022]       Medical History Past Medical History:  Diagnosis Date  . Brain aneurysm   . CAD in native artery    a.  [facility] 11/15/16 showing normal LVEDP, mild nonobstructive CAD (30% prox RCA, 40% ostial RPDA, 45% D1, 45% dLAD), severe AS with mean gradient 40.51mHg, AVA 0.76cm2.   . Carotid stenosis    a. 1-39% by duplex 2018.  .Marland KitchenCervical spondylolysis   . Chronic lower back pain   . CKD (chronic kidney disease), stage II   . [place]    "mild" (12/09/2016)  . DDD (degenerative disc disease), lumbar   . GERD (gastroesophageal reflux disease)   . Hard of hearing    wears hearing aides both ears  . Hyperlipidemia    can't take the meds so takes Fish Oil   . Hypertension   . Hypothyroidism   . IBS (irritable bowel syndrome)   . Intermittent dysphagia    for pills and solids  . Osteopenia   . S/P TAVR (transcatheter aortic valve replacement) 12/07/2016   26 mm Edwards Sapien 3 transcatheter heart valve placed via open left transcarotid approach   . Severe aortic stenosis    a. s/p TAVR 11/2016.  .Marland KitchenVertigo     Allergies Allergies  Allergen Reactions  . Lisinopril Cough  . Relafen [Nabumetone] Other (See Comments)    Unsure of exact reaction.  . Statins     Muscle pain  . Tape     Developed blisters from clear tape used over cath site 10/2016  . Welchol [Colesevelam] Other (See Comments)    Muscle pain  . Zetia [Ezetimibe] Other (See Comments)    Muscle pain.  . Neosporin [Neomycin-Bacitracin Zn-Polymyx] Other (See Comments)    Cause wound/scratch to worsen  . Zinc Oxide Rash    IV Location/Drains/Wounds Patient Lines/Drains/Airways Status   Active Line/Drains/Airways    Name:   Placement date:   Placement time:   Site:   Days:   Peripheral IV 12/14/17 Left Wrist   12/14/17    0512    Wrist   less than 1   Incision (Closed)  12/07/16 Neck Left   12/07/16    1426     372   Wound 02/20/13 Skin tear Arm Right 1" black scab   02/20/13    0800    Arm   1758          Labs/Imaging Results for orders placed or performed during the hospital encounter of 12/14/17 (from the past 48 hour(s))  CBG monitoring, ED     Status: Abnormal   Collection Time: 12/14/17  4:41 AM  Result Value Ref Range   Glucose-Capillary 124 (H) 70 - 99 mg/dL   Comment 1 Notify RN   Basic metabolic panel     Status: Abnormal   Collection Time: 12/14/17  4:43 AM  Result Value Ref Range   Sodium 140 135 - 145 mmol/L   Potassium 3.7 3.5 - 5.1 mmol/L   Chloride 104 98 - 111 mmol/L   CO2 26 22 - 32 mmol/L   Glucose, Bld 135 (H) 70 - 99 mg/dL   BUN 17 8 - 23 mg/dL   Creatinine, Ser 0.93 0.44 - 1.00 mg/dL   Calcium 8.8 (L) 8.9 - 10.3 mg/dL   GFR calc non  Af Amer 54 (L) >60 mL/min   GFR calc Af Amer >60 >60 mL/min    Comment: (NOTE) The eGFR has been calculated using the CKD EPI equation. This calculation has not been validated in all clinical situations. eGFR's persistently <60 mL/min signify possible Chronic Kidney Disease.    Anion gap 10 5 - 15    Comment: Performed at [facility], [place] [address], [place], [place] [address]  CBC     Status: None   Collection Time: 12/14/17  4:43 AM  Result Value Ref Range   WBC 4.7 4.0 - 10.5 K/uL   RBC 4.67 3.87 - 5.11 MIL/uL   Hemoglobin 14.0 12.0 - 15.0 g/dL   HCT 44.1 36.0 - 46.0 %   MCV 94.4 80.0 - 100.0 fL   MCH 30.0 26.0 - 34.0 pg   MCHC 31.7 30.0 - 36.0 g/dL   RDW 13.7 11.5 - 15.5 %   Platelets 170 150 - 400 K/uL   nRBC 0.0 0.0 - 0.2 %    Comment: Performed at [facility], [place] [address], [place], [place] [address]  Urinalysis, Routine w reflex microscopic     Status: Abnormal   Collection Time: 12/14/17  9:45 AM  Result Value Ref Range   Color, Urine YELLOW YELLOW   APPearance CLEAR CLEAR   Specific Gravity, Urine 1.011 1.005 - 1.030   pH 7.0 5.0 - 8.0   Glucose, UA NEGATIVE NEGATIVE mg/dL   Hgb urine dipstick NEGATIVE NEGATIVE   Bilirubin Urine NEGATIVE NEGATIVE   Ketones, ur NEGATIVE NEGATIVE mg/dL   Protein, ur NEGATIVE NEGATIVE mg/dL   Nitrite NEGATIVE NEGATIVE   Leukocytes, UA SMALL (A) NEGATIVE   RBC / HPF 0-5 0 - 5 RBC/hpf   WBC, UA 11-20 0 - 5 WBC/hpf   Bacteria, UA NONE SEEN NONE SEEN   Squamous Epithelial / LPF 0-5 0 - 5   Mucus PRESENT     Comment: Performed at [facility], [place] [address], [place], [place] [address]   No results found. None  Pending Labs Unresulted Labs (From admission, onward)   None      Vitals/Pain Today's Vitals   12/14/17 1300 12/14/17 1330 12/14/17 1400 12/14/17 1430  BP: (!) 156/66 (!) 144/67 (!) 154/78 (!) 168/63  Pulse: Marland Kitchen)  37 74 72 79  Resp: _0 SpO2: 94%  95% 93% 98%  Weight:      Height:      PainSc:        Isolation Precautions No active isolations  Medications Medications  enoxaparin (LOVENOX) injection 40 mg (has no administration in time range)  amLODipine (NORVASC) tablet 2.5 mg (has no administration in time range)  aspirin EC tablet 81 mg (has no administration in time range)  levothyroxine (SYNTHROID, LEVOTHROID) tablet 137 mcg (has no administration in time range)  scopolamine (TRANSDERM-SCOP) 1 MG/3DAYS 1.5 mg (has no administration in time range)  meclizine (ANTIVERT) tablet 25 mg (has no administration in time range)  ondansetron (ZOFRAN) injection 4 mg (has no administration in time range)  LORazepam (ATIVAN) injection 0.5 mg (has no administration in time range)  0.9 %  sodium chloride infusion (has no administration in time range)  sodium chloride 0.9 % bolus 500 mL (0 mLs Intravenous Stopped 12/14/17 0948)  ondansetron (ZOFRAN) injection 4 mg (4 mg Intravenous Given 12/14/17 0612)  LORazepam (ATIVAN) injection 0.5 mg (0.5 mg Intravenous Given 12/14/17 0614)  meclizine (ANTIVERT) tablet 25 mg (25 mg Oral Given 12/14/17 0647)    Mobility walks with person assist

## 2017-12-14 NOTE — ED Notes (Signed)
Pt aware urine sample is needed 

## 2017-12-14 NOTE — ED Notes (Signed)
Patient able to ambulate to restroom via 2 person assist to obtain urine sample. Returned patient to room and provided meal tray and beverage. Will continue to monitor patient at this time. Family at bedside and call bell within reach.

## 2017-12-14 NOTE — ED Notes (Signed)
Pt unable to take Antivert due to feeling nauseous and having 2 episodes of emesis. Will try after Zofran administered

## 2017-12-14 NOTE — ED Notes (Signed)
DJ, NT attempted to enter room to prepare patient for discharge. Patient expressed that she felt uncomfortable about going home and does not wish to be discharged at this time. Provider made aware.

## 2017-12-14 NOTE — ED Provider Notes (Signed)
Care assumed from [facility], PA-C at shift change with meds, re-eval pending.   In brief, this patient is a 84 y.o. F with PMH/o vertigo who presents for evaluation of dizziness described as a "spinning sensation." Associated with nausea/vomiting. Long standing history of vertigo. She was admitted for an MRI in August that was normal and patient's symptoms were due to vertigo. She states that this feels like her normal vertigo. She has meclizine at home but was unable to tolerate at home.   PLAN: Patient is getting fluids and ativan here in the ED. Re-eval after. If unable to tolerate fluids and is still symptomatic, she may need admission.   MDM:  Reevaluation.  Patient reports she feels slightly better since being in the ED.  We will plan to p.o. challenge and ambulate.  Patient able to tolerate p.o. without any difficulty.  Patient was able to ambulate to the bathroom with assistance.  Family reports that patient normally is able to ambulate independently.  Sometimes she uses a walker.  Patient states that she is not feeling as dizzy.  She states she feels comfortable to go home.  She has meclizine at home but she does not know how much of that she has left.  We will plan to refill her meclizine prescription.  RN informed me that patient was uncomfortable with discharge home.  I discussed with patient and son.  Patient states that she is still feeling "woozy" and son does not feel like she is back to baseline.  He states normally she is able to ambulate independently and is concerned that she is still having some dizziness and that she will not be ablet obe at home.   Discussed patient with Dr. Zigmund Daniel (hospitalist). Will admit.    1. Vertigo       Volanda Napoleon, PA-C 12/14/17 [place], [place], DO 12/14/17 2033

## 2017-12-14 NOTE — Progress Notes (Signed)
Pt had eaten 2 sandwiches in the ED prior to transport up to room 1520. MD paged and diet advanced to regular.  Kizzie Ide, RN

## 2017-12-14 NOTE — ED Notes (Signed)
Bed: RESB Expected date:  Expected time:  Means of arrival:  Comments: EMS 84 yo female with vertigo

## 2017-12-14 NOTE — ED Triage Notes (Signed)
Per ems: pt coming from home c/o vertigo that started last night with episodes of V/D. Happened a few months ago and was prescribed Antivert.   HX of CHF, HTN, COPD, GERD, valve replacment

## 2017-12-14 NOTE — ED Provider Notes (Signed)
[facility] DEPT Provider Note   CSN: [id number] Arrival date & time: 12/14/17  9562     History   Chief Complaint Chief Complaint  Patient presents with  . Dizziness    HPI Becky Mccall is a 84 y.o. female.  Patient presents with room spinning dizziness associated with nausea and vomiting that started last night. She called her family member at around 3:00 am with severe symptoms, which are similar to previous episodes of benign positional vertigo. She was last admitted in August of this year at which time she had multiple studies, including MRI's, that concluded with a diagnosis of vertigo. She has Meclizine at home but states she had emesis right after taking it. She has been unable to tolerate any PO fluids either. No fever. No symptoms that are unfamiliar to her.   The history is provided by the patient and a relative. No language interpreter was used.  Dizziness  Associated symptoms: nausea and vomiting     Past Medical History:  Diagnosis Date  . Brain aneurysm   . CAD in native artery    a.  [facility] 11/15/16 showing normal LVEDP, mild nonobstructive CAD (30% prox RCA, 40% ostial RPDA, 45% D1, 45% dLAD), severe AS with mean gradient 40.9mmHg, AVA 0.76cm2.   . Carotid stenosis    a. 1-39% by duplex 2018.  Marland Kitchen Cervical spondylolysis   . Chronic lower back pain   . CKD (chronic kidney disease), stage II   . COPD (chronic obstructive pulmonary disease) ([place])    "mild" (12/09/2016)  . DDD (degenerative disc disease), lumbar   . GERD (gastroesophageal reflux disease)   . Hard of hearing    wears hearing aides both ears  . Hyperlipidemia    can't take the meds so takes Fish Oil  . Hypertension   . Hypothyroidism   . IBS (irritable bowel syndrome)   . Intermittent dysphagia    for pills and solids  . Osteopenia   . S/P TAVR (transcatheter aortic valve replacement) 12/07/2016   26 mm Edwards Sapien 3 transcatheter heart valve placed via  open left transcarotid approach   . Severe aortic stenosis    a. s/p TAVR 11/2016.  Marland Kitchen Vertigo     Patient Active Problem List   Diagnosis Date Noted  . PVC's (premature ventricular contractions) 10/11/2017  . Leg cramps 10/11/2017  . Essential hypertension 10/10/2017  . Chronic pain of right knee 03/17/2017  . Unilateral primary osteoarthritis, left knee 03/17/2017  . Unilateral primary osteoarthritis, right knee 03/17/2017  . Chronic pain of left knee 02/07/2017  . S/P TAVR (transcatheter aortic valve replacement) 12/07/2016  . Severe aortic stenosis 12/07/2016  . Fibrocystic breast disease   . IBS (irritable bowel syndrome)   . Cervical spondylolysis   . Osteopenia   . Vitamin D deficiency   . DDD (degenerative disc disease), lumbar   . Squamous cell skin cancer   . Carotid stenosis   . OSA (obstructive sleep apnea)   . Colon polyp   . Vertigo 05/04/2013  . Hard of hearing   . Hyperlipidemia   . COPD (chronic obstructive pulmonary disease) ([place])   . Hypothyroidism   . GERD (gastroesophageal reflux disease)   . Spinal stenosis in cervical region 02/19/2013    Past Surgical History:  Procedure Laterality Date  . ABDOMINAL HYSTERECTOMY    . APPENDECTOMY    . BACK SURGERY  2009   "on her lower back; not sure what they did" (12/09/2016)  .  BLEPHAROPLASTY Bilateral   . CARDIAC CATHETERIZATION    . CARDIAC VALVE REPLACEMENT  12/07/2016   Edwards Sapien 3 THV (size 26 mm, model # 9600TFX, serial # [id number]  . CATARACT EXTRACTION W/ INTRAOCULAR LENS  IMPLANT, BILATERAL  1999,2000  . CATARACT EXTRACTION, BILATERAL Bilateral   . COLONOSCOPY W/ BIOPSIES AND POLYPECTOMY  11/2003   Archie Endo 07/08/2010  . EYE SURGERY    . HEMORROIDECTOMY    . LUMBAR LAMINECTOMY  1999  . LUMBAR LAMINECTOMY/DECOMPRESSION MICRODISCECTOMY  09/1999   Archie Endo 07/08/2010   . POSTERIOR FUSION LUMBAR SPINE  2014  . RIGHT/LEFT HEART CATH AND CORONARY ANGIOGRAPHY N/A 11/15/2016   Procedure: RIGHT/LEFT HEART  CATH AND CORONARY ANGIOGRAPHY;  Surgeon: [name], MD;  Location: [facility];  Service: Cardiovascular;  Laterality: N/A;  . SKIN GRAFT SPLIT THICKNESS LEG / FOOT Left 09/2000   Archie Endo 07/08/2010  . TEE WITHOUT CARDIOVERSION N/A 12/07/2016   Procedure: TRANSESOPHAGEAL ECHOCARDIOGRAM (TEE);  Surgeon: [name], MD;  Location: [facility];  Service: Open Heart Surgery;  Laterality: N/A;  . TONSILLECTOMY    . TRANSCATHETER AORTIC VALVE REPLACEMENT, TRANSFEMORAL  12/07/2016   Edwards Sapien 3 THV (size 26 mm, model # 9600TFX, serial # [id number]     OB History   None      Home Medications    Prior to Admission medications   Medication Sig Start Date End Date Taking? Authorizing Provider  amLODipine (NORVASC) 2.5 MG tablet Take 2.5 mg by mouth daily.   Yes [provider]  aspirin EC 81 MG tablet Take 1 tablet (81 mg total) by mouth daily. 11/10/16  Yes Dunn, Dayna N, PA-C  Calcium Carb-Cholecalciferol (CALCIUM 1000 + D PO) Take 1 tablet by mouth daily.    Yes [provider]  Cholecalciferol (VITAMIN D) 2000 UNITS tablet Take 6,000 Units by mouth daily.    Yes [provider]  Cinnamon 500 MG capsule Take 500 mg by mouth 2 (two) times daily.    Yes [provider]  levothyroxine (SYNTHROID, LEVOTHROID) 137 MCG tablet Take 137 mcg by mouth daily before breakfast.   Yes [provider]  meclizine (ANTIVERT) 25 MG tablet Take 1 tablet (25 mg total) by mouth 3 (three) times daily as needed for dizziness. 10/23/17  Yes Thurnell Lose, MD  Multiple Vitamin (MULTIVITAMIN WITH MINERALS) TABS tablet Take 1 tablet by mouth daily.   Yes [provider]  Omega-3 Fatty Acids (FISH OIL) 1000 MG CAPS Take 1,000 mg by mouth 2 (two) times daily.   Yes [provider]  tiotropium (SPIRIVA) 18 MCG inhalation capsule Place 18 mcg into inhaler and inhale daily.   Yes [provider]  traMADol (ULTRAM) 50 MG tablet Take 1 tablet  (50 mg total) by mouth every 8 (eight) hours as needed (pain not relieved by tylenol). 05/05/13  Yes Barton Dubois, MD  Turmeric 500 MG CAPS Take 500 mg by mouth 2 (two) times daily.   Yes [provider]  vitamin B-12 (CYANOCOBALAMIN) 1000 MCG tablet Take 1,000 mcg by mouth daily.   Yes [provider]  vitamin C (ASCORBIC ACID) 500 MG tablet Take 500 mg by mouth daily.   Yes [provider]  vitamin E 400 UNIT capsule Take 400 Units by mouth 2 (two) times daily.    Yes [provider]    Family History Family History  Problem Relation Age of Onset  . Cancer - Lung Mother   . Heart attack  Father   . Cancer - Lung Brother   . Cancer - Lung Maternal Aunt     Social History Social History   Tobacco Use  . Smoking status: Former Smoker    Packs/day: 2.00    Years: 40.00    Pack years: 80.00    Types: Cigarettes    Last attempt to quit: 08/02/1989    Years since quitting: 28.3  . Smokeless tobacco: Never Used  Substance Use Topics  . Alcohol use: Yes    Alcohol/week: 14.0 standard drinks    Types: 7 Standard drinks or equivalent, 7 Glasses of wine per week  . Drug use: No     Allergies   Lisinopril; Relafen [nabumetone]; Statins; Tape; Welchol [colesevelam]; Zetia [ezetimibe]; Neosporin [neomycin-bacitracin zn-polymyx]; and Zinc oxide   Review of Systems Review of Systems  Constitutional: Negative for chills and fever.  HENT: Negative.   Respiratory: Negative.   Cardiovascular: Negative.   Gastrointestinal: Positive for nausea and vomiting.  Musculoskeletal: Negative.   Skin: Negative.   Neurological: Positive for dizziness.     Physical Exam Updated Vital Signs BP (!) 177/69 (BP Location: Left Arm)   Pulse 78   Resp 19   Ht 5\' 6"  (1.676 m)   Wt 77 kg   LMP  (LMP Unknown)   SpO2 97%   BMI 27.40 kg/m   Physical Exam  Constitutional: She is oriented to person, place, and time. She appears well-developed and well-nourished.   HENT:  Head: Normocephalic.  Neck: Normal range of motion. Neck supple.  Cardiovascular: Normal rate and regular rhythm.  Pulmonary/Chest: Effort normal and breath sounds normal.  Abdominal: Soft. Bowel sounds are normal. There is no tenderness. There is no rebound and no guarding.  Musculoskeletal: Normal range of motion.  Neurological: She is alert and oriented to person, place, and time. She displays normal reflexes. No sensory deficit.  Patient unable to tolerate tests of coordination due to severe dizziness. +Nystagmus.  Skin: Skin is warm and dry. No rash noted.  Psychiatric: She has a normal mood and affect.     ED Treatments / Results  Labs (all labs ordered are listed, but only abnormal results are displayed) Labs Reviewed  BASIC METABOLIC PANEL - Abnormal; Notable for the following components:      Result Value   Glucose, Bld 135 (*)    Calcium 8.8 (*)    GFR calc non Af Amer 54 (*)    All other components within normal limits  CBG MONITORING, ED - Abnormal; Notable for the following components:   Glucose-Capillary 124 (*)    All other components within normal limits  CBC  URINALYSIS, ROUTINE W REFLEX MICROSCOPIC   Results for orders placed or performed during the hospital encounter of 03/50/09  Basic metabolic panel  Result Value Ref Range   Sodium 140 135 - 145 mmol/L   Potassium 3.7 3.5 - 5.1 mmol/L   Chloride 104 98 - 111 mmol/L   CO2 26 22 - 32 mmol/L   Glucose, Bld 135 (H) 70 - 99 mg/dL   BUN 17 8 - 23 mg/dL   Creatinine, Ser 0.93 0.44 - 1.00 mg/dL   Calcium 8.8 (L) 8.9 - 10.3 mg/dL   GFR calc non Af Amer 54 (L) >60 mL/min   GFR calc Af Amer >60 >60 mL/min   Anion gap 10 5 - 15  CBC  Result Value Ref Range   WBC 4.7 4.0 - 10.5 K/uL   RBC 4.67 3.87 -  5.11 MIL/uL   Hemoglobin 14.0 12.0 - 15.0 g/dL   HCT 44.1 36.0 - 46.0 %   MCV 94.4 80.0 - 100.0 fL   MCH 30.0 26.0 - 34.0 pg   MCHC 31.7 30.0 - 36.0 g/dL   RDW 13.7 11.5 - 15.5 %   Platelets 170 150  - 400 K/uL   nRBC 0.0 0.0 - 0.2 %  CBG monitoring, ED  Result Value Ref Range   Glucose-Capillary 124 (H) 70 - 99 mg/dL   Comment 1 Notify RN      EKG None  Radiology No results found.  Procedures Procedures (including critical care time)  Medications Ordered in ED Medications  sodium chloride 0.9 % bolus 500 mL (has no administration in time range)  ondansetron (ZOFRAN) injection 4 mg (has no administration in time range)  LORazepam (ATIVAN) injection 0.5 mg (has no administration in time range)  meclizine (ANTIVERT) tablet 25 mg (has no administration in time range)     Initial Impression / Assessment and Plan / ED Course  I have reviewed the triage vital signs and the nursing notes.  Pertinent labs & imaging results that were available during my care of the patient were reviewed by me and considered in my medical decision making (see chart for details).     Patient with a history of vertigo, last admitted in August of this year, presents with same symptoms as previous bouts of vertigo. C/O dizziness, severe nausea and vomiting.   Chart reviewed. The patient underwent MRI studies and neurologic consultation during the August admission. MRI's were all negative for neurologic event and all neurologic exams were consistent with BPPV.   IV started. Gentle bolus provided with Zofran and 0.5 Ativan for symptoms. She is unable to tolerate PO Meclizine at this time.   Patient care signed out to Simonne Martinet, PA-C, pending observation and re-evaluation. Anticipate admission unless there is significant improvement in symptoms.   Final Clinical Impressions(s) / ED Diagnoses   Final diagnoses:  None   1. Vertigo  ED Discharge Orders    None       Charlann Lange, PA-C 12/14/17 9833    Orpah Greek, MD 12/14/17 2342

## 2017-12-15 DIAGNOSIS — R42 Dizziness and giddiness: Secondary | ICD-10-CM | POA: Diagnosis not present

## 2017-12-15 MED ORDER — ONDANSETRON 4 MG PO TBDP: 4.0000 mg | ORAL_TABLET | Freq: Three times a day (TID) | ORAL | 0 refills | Status: DC | PRN

## 2017-12-15 MED ORDER — SCOPOLAMINE 1 MG/3DAYS TD PT72: 1.0000 | MEDICATED_PATCH | TRANSDERMAL | 12 refills | Status: DC

## 2017-12-15 MED ORDER — MECLIZINE HCL 12.5 MG PO TABS: 12.5000 mg | ORAL_TABLET | Freq: Three times a day (TID) | ORAL | 0 refills | Status: AC | PRN

## 2017-12-15 NOTE — Care Management Note (Signed)
Case Management Note  Patient Details  Name: Becky Mccall MRN: [id number] Date of Birth: 1929-04-04  Subjective/Objective:                   discharged to home self care  Action/Plan: Discharge to home with self care, orders checked for hhc needs. No CM needs present at time of discharge.  Expected Discharge Date:  12/15/17               Expected Discharge Plan:  Home/Self Care  In-House Referral:     Discharge planning Services  CM Consult  Post Acute Care Choice:    Choice offered to:     DME Arranged:    DME Agency:     HH Arranged:    HH Agency:     Status of Service:  Completed, signed off  If discussed at [employer] of Stay Meetings, dates discussed:    Additional Comments:  Leeroy Cha, RN 12/15/2017, 10:32 AM

## 2017-12-15 NOTE — Discharge Summary (Signed)
Physician Discharge Summary  Becky Mccall EXH:[id number] DOB: [birth year] DOA: 12/14/2017  PCP: [name], MD  Admit date: 12/14/2017 Discharge date: 12/15/2017  Admitted From: home Disposition:  Home  Recommendations for Outpatient Follow-up:  1. Follow up with PCP in 1-2 weeks   Home Health:No Equipment/Devices:none  Discharge Condition:stable CODE STATUS:full Diet recommendation: Heart Healthy   Brief/Interim Summary: 84 year old with past medical history of chronic vertigo probably due to BPH, with a recent work-up in the hospital that was negative for vertigo and was referred to an ENT doctor comes in for ongoing vertigo that started on the day of admission.  She was supposed to follow up with the ENT but she did not follow through.  When she got her vertigo symptoms she did not take her meclizine as she was nauseated and throwing up.  Discharge Diagnoses:  Active Problems:   Vertigo She was started on Antivert Zofran and scopolamine patch and by the next day her vertigo was resolved physical therapy evaluated the patient and recommended home health PT. She will go home on Antivert scopolamine and Zofran ODT I have given her a prescription for all 3. I have instructed her son that she needs to follow-up with ENT as an outpatient.   Discharge Instructions  Discharge Instructions    Diet - low sodium heart healthy   Complete by:  As directed    Increase activity slowly   Complete by:  As directed      Allergies as of 12/15/2017      Reactions   Lisinopril Cough   Relafen [nabumetone] Other (See Comments)   Unsure of exact reaction.   Statins    Muscle pain   Tape    Developed blisters from clear tape used over cath site 10/2016   Welchol [colesevelam] Other (See Comments)   Muscle pain   Zetia [ezetimibe] Other (See Comments)   Muscle pain.   Neosporin [neomycin-bacitracin Zn-polymyx] Other (See Comments)   Cause wound/scratch to worsen   Zinc Oxide  Rash      Medication List    TAKE these medications   amLODipine 2.5 MG tablet Commonly known as:  NORVASC Take 2.5 mg by mouth daily.   aspirin EC 81 MG tablet Take 1 tablet (81 mg total) by mouth daily.   CALCIUM 1000 + D PO Take 1 tablet by mouth daily.   Cinnamon 500 MG capsule Take 500 mg by mouth 2 (two) times daily.   Fish Oil 1000 MG Caps Take 1,000 mg by mouth 2 (two) times daily.   levothyroxine 137 MCG tablet Commonly known as:  SYNTHROID, LEVOTHROID Take 137 mcg by mouth daily before breakfast.   meclizine 12.5 MG tablet Commonly known as:  ANTIVERT Take 1 tablet (12.5 mg total) by mouth 3 (three) times daily as needed for dizziness. What changed:    medication strength  how much to take   multivitamin with minerals Tabs tablet Take 1 tablet by mouth daily.   ondansetron 4 MG disintegrating tablet Commonly known as:  ZOFRAN-ODT Take 1 tablet (4 mg total) by mouth every 8 (eight) hours as needed for nausea or vomiting.   scopolamine 1 MG/3DAYS Commonly known as:  TRANSDERM-SCOP Place 1 patch (1.5 mg total) onto the skin every 3 (three) days. Start taking on:  12/17/2017   tiotropium 18 MCG inhalation capsule Commonly known as:  SPIRIVA Place 18 mcg into inhaler and inhale daily.   traMADol 50 MG tablet Commonly known as:  ULTRAM Take 1  tablet (50 mg total) by mouth every 8 (eight) hours as needed (pain not relieved by tylenol).   Turmeric 500 MG Caps Take 500 mg by mouth 2 (two) times daily.   vitamin B-12 1000 MCG tablet Commonly known as:  CYANOCOBALAMIN Take 1,000 mcg by mouth daily.   vitamin C 500 MG tablet Commonly known as:  ASCORBIC ACID Take 500 mg by mouth daily.   Vitamin D 2000 units tablet Take 6,000 Units by mouth daily.   vitamin E 400 UNIT capsule Take 400 Units by mouth 2 (two) times daily.      Follow-up Information    Schedule an appointment as soon as possible for a visit  with [name], MD.   Specialty:   Family Medicine Contact information: [address] [place] [address] [phone]          Allergies  Allergen Reactions  . Lisinopril Cough  . Relafen [Nabumetone] Other (See Comments)    Unsure of exact reaction.  . Statins     Muscle pain  . Tape     Developed blisters from clear tape used over cath site 10/2016  . Welchol [Colesevelam] Other (See Comments)    Muscle pain  . Zetia [Ezetimibe] Other (See Comments)    Muscle pain.  . Neosporin [Neomycin-Bacitracin Zn-Polymyx] Other (See Comments)    Cause wound/scratch to worsen  . Zinc Oxide Rash    Consultations:  None   Procedures/Studies:  No results found.   Subjective: No new complaints feels great.  Discharge Exam: Vitals:   12/14/17 2016 12/15/17 0645  BP: (!) 147/64 (!) 160/69  Pulse: 69 66  Resp: 15 20  Temp: 98.8 F (37.1 C) 98.2 F (36.8 C)  SpO2: 92% 94%   Vitals:   12/14/17 1500 12/14/17 1542 12/14/17 2016 12/15/17 0645  BP: (!) 194/90 (!) 160/81 (!) 147/64 (!) 160/69  Pulse: 78 84 69 66  Resp: 17 16 15 20   Temp:  98.4 F (36.9 C) 98.8 F (37.1 C) 98.2 F (36.8 C)  TempSrc:  Oral Oral Oral  SpO2: 96% 94% 92% 94%  Weight:      Height:        General: Pt is alert, awake, not in acute distress Cardiovascular: RRR, S1/S2 +, no rubs, no gallops Respiratory: CTA bilaterally, no wheezing, no rhonchi Abdominal: Soft, NT, ND, bowel sounds + Extremities: no edema, no cyanosis    The results of significant diagnostics from this hospitalization (including imaging, microbiology, ancillary and laboratory) are listed below for reference.     Microbiology: No results found for this or any previous visit (from the past 240 hour(s)).   Labs: BNP (last 3 results) No results for input(s): BNP in the last 8760 hours. Basic Metabolic Panel: Recent Labs  Lab 12/14/17 0443  NA 140  K 3.7  CL 104  CO2 26  GLUCOSE 135*  BUN 17  CREATININE 0.93  CALCIUM 8.8*    Liver Function Tests: No results for input(s): AST, ALT, ALKPHOS, BILITOT, PROT, ALBUMIN in the last 168 hours. No results for input(s): LIPASE, AMYLASE in the last 168 hours. No results for input(s): AMMONIA in the last 168 hours. CBC: Recent Labs  Lab 12/14/17 0443  WBC 4.7  HGB 14.0  HCT 44.1  MCV 94.4  PLT 170   Cardiac Enzymes: No results for input(s): CKTOTAL, CKMB, CKMBINDEX, TROPONINI in the last 168 hours. BNP: Invalid input(s): POCBNP CBG: Recent Labs  Lab 12/14/17 0441  GLUCAP  124*   D-Dimer No results for input(s): DDIMER in the last 72 hours. Hgb A1c No results for input(s): HGBA1C in the last 72 hours. Lipid Profile No results for input(s): CHOL, HDL, LDLCALC, TRIG, CHOLHDL, LDLDIRECT in the last 72 hours. Thyroid function studies No results for input(s): TSH, T4TOTAL, T3FREE, THYROIDAB in the last 72 hours.  Invalid input(s): FREET3 Anemia work up No results for input(s): VITAMINB12, FOLATE, FERRITIN, TIBC, IRON, RETICCTPCT in the last 72 hours. Urinalysis    Component Value Date/Time   COLORURINE YELLOW 12/14/2017 0945   APPEARANCEUR CLEAR 12/14/2017 0945   LABSPEC 1.011 12/14/2017 0945   PHURINE 7.0 12/14/2017 0945   GLUCOSEU NEGATIVE 12/14/2017 0945   HGBUR NEGATIVE 12/14/2017 0945   BILIRUBINUR NEGATIVE 12/14/2017 0945   KETONESUR NEGATIVE 12/14/2017 0945   PROTEINUR NEGATIVE 12/14/2017 0945   UROBILINOGEN 0.2 05/04/2013 0328   NITRITE NEGATIVE 12/14/2017 0945   LEUKOCYTESUR SMALL (A) 12/14/2017 0945   Sepsis Labs Invalid input(s): PROCALCITONIN,  WBC,  LACTICIDVEN Microbiology No results found for this or any previous visit (from the past 240 hour(s)).   Time coordinating discharge: 40 minutes  SIGNED:   Charlynne Cousins, MD  [facility] 12/15/2017, 10:08 AM Pager   If 7PM-7AM, please contact night-coverage [email or web address] Password [email or web address]

## 2017-12-15 NOTE — Evaluation (Signed)
Physical Therapy Evaluation Patient Details Name: Becky Mccall MRN: [id number] DOB: 11-Jun-1929 Today's Date: 12/15/2017   History of Present Illness  84 year old female presented to ED for ongoing vertigo. Pt with admission one month ago for with dizziness, N/V with negative MRI. ENT f/u was recommended at that time.  PMHx: HTN, TAVR, CHF, COPD, hypothyroidism, CAD, CKD, spine fusion  Clinical Impression  Patient evaluated by Physical Therapy with no further acute PT needs identified. All education has been completed and the patient has no further questions.  See below for any follow-up Physical Therapy or equipment needs. PT is signing off. Thank you for this referral.  Pt seen and initiated vestibular exam.  Pt reports no symptoms today.  Positive right beating nystagmus observed with pt looking towards left visual field however pt denies any symptoms.  Pt states she went to ENT for follow up however was only checked for her hearing, no vestibular exam performed. Pt reports she performed (what she describes as) Laruth Bouchard Daroff exercise at home the day before her nausea and vomiting started.  Pt ambulated in hallway today with RW (more for her chronic knee pain) and had no symptoms however aware to gaze at stationary object while moving to reduce symptoms if they do start.  Pt also educated to not perform any vestibular exercises upon d/c and if symptoms reoccur, to take her medicine (reports Meclizine helps alleviate symptoms) as needed and f/u with PCP and/or vestibular PT.  Pt anticipates d/c home today.  Referral for OP vestibular PT provided.     Follow Up Recommendations Outpatient PT(vestibular)    Equipment Recommendations  None recommended by PT    Recommendations for Other Services       Precautions / Restrictions Precautions Precautions: Fall      Mobility  Bed Mobility Overal bed mobility: Modified Independent                Transfers Overall transfer level:  Needs assistance Equipment used: Rolling walker (2 wheeled) Transfers: Sit to/from Stand Sit to Stand: Supervision            Ambulation/Gait Ambulation/Gait assistance: Min guard;Supervision Gait Distance (Feet): 200 Feet Assistive device: Rolling walker (2 wheeled) Gait Pattern/deviations: Step-through pattern;Decreased stride length     General Gait Details: slow cautious gait however no unsteadiness or LOB observed, pt performed looking up and down as well as right and left multiple times and did not have any symptoms  Stairs            Wheelchair Mobility    Modified Rankin (Stroke Patients Only)       Balance Overall balance assessment: History of Falls                                           Pertinent Vitals/Pain Pain Assessment: No/denies pain    Home Living Family/patient expects to be discharged to:: Private residence Living Arrangements: Spouse/significant other Available Help at Discharge: Family Type of Home: House       Home Layout: One level Home Equipment: Environmental consultant - 2 wheels;Bedside commode;Shower seat      Prior Function Level of Independence: Independent         Comments: pt reports she sometimes uses RW at beginning of the day due to chronic knee pain     Hand Dominance        Extremity/Trunk Assessment  Lower Extremity Assessment Lower Extremity Assessment: Overall WFL for tasks assessed       Communication   Communication: HOH  Cognition Arousal/Alertness: Awake/alert Behavior During Therapy: WFL for tasks assessed/performed Overall Cognitive Status: Within Functional Limits for tasks assessed                                        General Comments      Exercises     Assessment/Plan    PT Assessment Patent does not need any further PT services  PT Problem List         PT Treatment Interventions      PT Goals (Current goals can be found in the Care Plan  section)  Acute Rehab PT Goals PT Goal Formulation: All assessment and education complete, DC therapy    Frequency     Barriers to discharge        Co-evaluation               AM-PAC PT "6 Clicks" Daily Activity  Outcome Measure Difficulty turning over in bed (including adjusting bedclothes, sheets and blankets)?: None Difficulty moving from lying on back to sitting on the side of the bed? : None Difficulty sitting down on and standing up from a chair with arms (e.g., wheelchair, bedside commode, etc,.)?: None Help needed moving to and from a bed to chair (including a wheelchair)?: None Help needed walking in hospital room?: A Little Help needed climbing 3-5 steps with a railing? : A Little 6 Click Score: 22    End of Session Equipment Utilized During Treatment: Gait belt Activity Tolerance: Patient tolerated treatment well Patient left: in bed;with call bell/phone within reach;with family/visitor present Nurse Communication: Mobility status PT Visit Diagnosis: Unsteadiness on feet (R26.81)    Time: 1010-1042 PT Time Calculation (min) (ACUTE ONLY): 32 min   Charges:   PT Evaluation $PT Eval Moderate Complexity: 1 Mod PT Treatments $Gait Training: 8-22 mins       Carmelia Bake, PT, DPT Acute Rehabilitation Services Office: [phone] Pager: [phone]   Trena Platt 12/15/2017, 1:45 PM

## 2017-12-16 ENCOUNTER — Encounter: Payer: Self-pay | Admitting: Thoracic Surgery (Cardiothoracic Vascular Surgery)

## 2017-12-22 DIAGNOSIS — H8110 Benign paroxysmal vertigo, unspecified ear: Secondary | ICD-10-CM | POA: Diagnosis not present

## 2017-12-22 DIAGNOSIS — R2231 Localized swelling, mass and lump, right upper limb: Secondary | ICD-10-CM | POA: Diagnosis not present

## 2017-12-29 ENCOUNTER — Ambulatory Visit: Payer: Medicare Other | Admitting: Neurology

## 2017-12-29 ENCOUNTER — Encounter: Payer: Self-pay | Admitting: Neurology

## 2017-12-29 ENCOUNTER — Other Ambulatory Visit: Payer: Self-pay

## 2017-12-29 VITALS — BP 179/74 | HR 75 | Resp 20 | Ht 66.0 in | Wt 168.0 lb

## 2017-12-29 DIAGNOSIS — R42 Dizziness and giddiness: Secondary | ICD-10-CM

## 2017-12-29 MED ORDER — PROMETHAZINE HCL 12.5 MG PO TABS: 12.5000 mg | ORAL_TABLET | Freq: Four times a day (QID) | ORAL | 1 refills | Status: DC | PRN

## 2017-12-29 NOTE — Progress Notes (Signed)
Reason for visit: Vertigo  Referring physician: Dr. Sampson [name] is a 84 y.o. female  History of present illness:  Becky Mccall is an 84 year old right-handed white female with a history of recurring episodes of vertigo.  The patient was seen through this office in 2015 following an event of vertigo leading to hospitalization.  A work-up at that time did not show evidence of an acute stroke.  The patient has done fairly well for several years, but she had some recurring vertigo in February 2019, this lasted several days and improved.  The patient has had 2 other recurrences leading to hospitalizations, one on 21 October 2017 and another on 14 December 2017.  The episodes will come on relatively suddenly associated with true vertigo and nausea and vomiting.  The patient is unable to walk effectively during the episodes.  The patient has been set up for physical therapy in the home environment but she claims that therapies are working more on balance training rather than vestibular therapy.  The patient indicates that when she lies down at night, she has learned to roll on her left side because if she goes to her right side she gets the vertigo.  During the episodes of vertigo, the patient has visual blurring, she cannot focus.  She reports no headache.  She does have decreased hearing in the right ear, she has been seen by Dr. Ernesto Rutherford from ENT.  The patient reports no focal numbness or weakness of the face, arms, legs.  She has not had any blackout episodes.  MRI evaluation of the brain was done in August 2019, this showed evidence of some small vessel disease that did also involve the brainstem area, no acute changes were seen.  The patient comes to this office for an evaluation.  She has been on meclizine, she was not able to afford the Transderm-Scop patch that was given her.  Past Medical History:  Diagnosis Date  . Brain aneurysm   . CAD in native artery    a.  [facility] 11/15/16  showing normal LVEDP, mild nonobstructive CAD (30% prox RCA, 40% ostial RPDA, 45% D1, 45% dLAD), severe AS with mean gradient 40.47mmHg, AVA 0.76cm2.   . Carotid stenosis    a. 1-39% by duplex 2018.  Marland Kitchen Cervical spondylolysis   . Chronic lower back pain   . CKD (chronic kidney disease), stage II   . COPD (chronic obstructive pulmonary disease) ([place])    "mild" (12/09/2016)  . DDD (degenerative disc disease), lumbar   . GERD (gastroesophageal reflux disease)   . Hard of hearing    wears hearing aides both ears  . Hyperlipidemia    can't take the meds so takes Fish Oil  . Hypertension   . Hypothyroidism   . IBS (irritable bowel syndrome)   . Intermittent dysphagia    for pills and solids  . Osteopenia   . S/P TAVR (transcatheter aortic valve replacement) 12/07/2016   26 mm Edwards Sapien 3 transcatheter heart valve placed via open left transcarotid approach   . Severe aortic stenosis    a. s/p TAVR 11/2016.  Marland Kitchen Vertigo     Past Surgical History:  Procedure Laterality Date  . ABDOMINAL HYSTERECTOMY    . APPENDECTOMY    . BACK SURGERY  2009   "on her lower back; not sure what they did" (12/09/2016)  . BLEPHAROPLASTY Bilateral   . CARDIAC CATHETERIZATION    . CARDIAC VALVE REPLACEMENT  12/07/2016   Oletta Lamas  Sapien 3 THV (size 26 mm, model # 9600TFX, serial # [id number]  . CATARACT EXTRACTION W/ INTRAOCULAR LENS  IMPLANT, BILATERAL  1999,2000  . CATARACT EXTRACTION, BILATERAL Bilateral   . COLONOSCOPY W/ BIOPSIES AND POLYPECTOMY  11/2003   Archie Endo 07/08/2010  . EYE SURGERY    . HEMORROIDECTOMY    . LUMBAR LAMINECTOMY  1999  . LUMBAR LAMINECTOMY/DECOMPRESSION MICRODISCECTOMY  09/1999   Archie Endo 07/08/2010   . POSTERIOR FUSION LUMBAR SPINE  2014  . RIGHT/LEFT HEART CATH AND CORONARY ANGIOGRAPHY N/A 11/15/2016   Procedure: RIGHT/LEFT HEART CATH AND CORONARY ANGIOGRAPHY;  Surgeon: [name], MD;  Location: [facility];  Service: Cardiovascular;  Laterality: N/A;  . SKIN GRAFT  SPLIT THICKNESS LEG / FOOT Left 09/2000   Archie Endo 07/08/2010  . TEE WITHOUT CARDIOVERSION N/A 12/07/2016   Procedure: TRANSESOPHAGEAL ECHOCARDIOGRAM (TEE);  Surgeon: [name], MD;  Location: [facility];  Service: Open Heart Surgery;  Laterality: N/A;  . TONSILLECTOMY    . TRANSCATHETER AORTIC VALVE REPLACEMENT, TRANSFEMORAL  12/07/2016   Edwards Sapien 3 THV (size 26 mm, model # 9600TFX, serial # [id number]    Family History  Problem Relation Age of Onset  . Cancer - Lung Mother   . Heart attack Father   . Cancer - Lung Brother   . Cancer - Lung Maternal Aunt     Social history:  reports that she quit smoking about 28 years ago. Her smoking use included cigarettes. She has a 80.00 pack-year smoking history. She has never used smokeless tobacco. She reports that she drinks about 14.0 standard drinks of alcohol per week. She reports that she does not use drugs.  Medications:  Prior to Admission medications   Medication Sig Start Date End Date Taking? Authorizing Provider  amLODipine (NORVASC) 2.5 MG tablet Take 2.5 mg by mouth daily.   Yes [provider]  aspirin EC 81 MG tablet Take 1 tablet (81 mg total) by mouth daily. 11/10/16  Yes Dunn, Dayna N, PA-C  Calcium Carb-Cholecalciferol (CALCIUM 1000 + D PO) Take 1 tablet by mouth daily.    Yes [provider]  Cholecalciferol (VITAMIN D) 2000 UNITS tablet Take 6,000 Units by mouth daily.    Yes [provider]  Cinnamon 500 MG capsule Take 500 mg by mouth 2 (two) times daily.    Yes [provider]  levothyroxine (SYNTHROID, LEVOTHROID) 137 MCG tablet Take 137 mcg by mouth daily before breakfast.   Yes [provider]  meclizine (ANTIVERT) 12.5 MG tablet Take 1 tablet (12.5 mg total) by mouth 3 (three) times daily as needed for dizziness. 12/15/17  Yes Charlynne Cousins, MD  Multiple Vitamin (MULTIVITAMIN WITH MINERALS) TABS tablet Take 1 tablet by mouth daily.   Yes [provider]    Omega-3 Fatty Acids (FISH OIL) 1000 MG CAPS Take 1,000 mg by mouth 2 (two) times daily.   Yes [provider]  tiotropium (SPIRIVA) 18 MCG inhalation capsule Place 18 mcg into inhaler and inhale daily.   Yes [provider]  traMADol (ULTRAM) 50 MG tablet Take 1 tablet (50 mg total) by mouth every 8 (eight) hours as needed (pain not relieved by tylenol). 05/05/13  Yes Barton Dubois, MD  Turmeric 500 MG CAPS Take 500 mg by mouth 2 (two) times daily.   Yes [provider]  vitamin B-12 (CYANOCOBALAMIN) 1000 MCG tablet Take 1,000 mcg by mouth daily.   Yes [provider]  vitamin C (ASCORBIC ACID) 500 MG  tablet Take 500 mg by mouth daily.   Yes [provider]  vitamin E 400 UNIT capsule Take 400 Units by mouth 2 (two) times daily.    Yes [provider]      Allergies  Allergen Reactions  . Lisinopril Cough  . Relafen [Nabumetone] Other (See Comments)    Unsure of exact reaction.  . Statins     Muscle pain  . Tape     Developed blisters from clear tape used over cath site 10/2016  . Welchol [Colesevelam] Other (See Comments)    Muscle pain  . Zetia [Ezetimibe] Other (See Comments)    Muscle pain.  . Neosporin [Neomycin-Bacitracin Zn-Polymyx] Other (See Comments)    Cause wound/scratch to worsen  . Zinc Oxide Rash    ROS:  Out of a complete 14 system review of symptoms, the patient complains only of the following symptoms, and all other reviewed systems are negative.  Hearing loss Blurred vision Shortness of breath Incontinence of bowel and bladder Easy bruising, easy bleeding Joint pain, aching muscles Memory loss, confusion, numbness, dizziness Anxiety, decreased interest in activities  Blood pressure (!) 179/74, pulse 75, resp. rate 20, height 5\' 6"  (1.676 m), weight 168 lb (76.2 kg).  Physical Exam  General: The patient is alert and cooperative at the time of the examination.  Eyes: Pupils are equal, round, and  reactive to light. Discs are flat bilaterally.   Ears: Tympanic membranes are clear bilaterally.  Neck: The neck is supple, no carotid bruits are noted.  Respiratory: The respiratory examination is clear.  Cardiovascular: The cardiovascular examination reveals a regular rate and rhythm, no obvious murmurs or rubs are noted.  Skin: Extremities are without significant edema.  Neurologic Exam  Mental status: The patient is alert and oriented x 3 at the time of the examination. The patient has apparent normal recent and remote memory, with an apparently normal attention span and concentration ability.  Cranial nerves: Facial symmetry is present. There is good sensation of the face to pinprick and soft touch bilaterally. The strength of the facial muscles and the muscles to head turning and shoulder shrug are normal bilaterally. Speech is well enunciated, no aphasia or dysarthria is noted. Extraocular movements are full. Visual fields are full. The tongue is midline, and the patient has symmetric elevation of the soft palate. No obvious hearing deficits are noted.  Motor: The motor testing reveals 5 over 5 strength of all 4 extremities. Good symmetric motor tone is noted throughout.  Sensory: Sensory testing is intact to pinprick, soft touch, vibration sensation, and position sense on all 4 extremities. No evidence of extinction is noted.  Coordination: Cerebellar testing reveals good finger-nose-finger and heel-to-shin bilaterally. The Nyan-Barrany procedure was done, the patient has had vertigo when head turning was to the right, this lasted only about 10 seconds and resolved, associated with increased nystagmus.  With the sitting position, the patient does have end-gaze nystagmus bilaterally.  Gait and station: Gait is minimally wide-based, patient takes short steps.  Tandem gait was not attempted.  Romberg is negative.  Reflexes: Deep tendon reflexes are symmetric, but are decreased  bilaterally. Toes are downgoing bilaterally.   MRI brain, MRA of the head and neck:  IMPRESSION: MRI HEAD:  1. No acute intracranial process. 2. Mild-to-moderate chronic small vessel ischemic changes.  MRA HEAD:  1. Moderately motion degraded examination. No emergent large vessel occlusion or flow-limiting stenosis. 2. Stable appearance of 2 mm LEFT cavernous ICA and 3 mm  LEFT PCOM origin aneurysms. 3. Mild stenosis bilateral M1 segments.  MRA NECK:  1. No hemodynamically significant stenosis ICA's. 2. Mild stenosis LEFT vertebral artery origin. Patent bilateral vertebral arteries.  * MRI scan images were reviewed online. I agree with the written report.     Assessment/Plan:  1.  Probable benign positional vertigo  The patient does not appear to have a positional component to the vertigo that comes on when turning her head to the right.  The patient has significant nausea and vomiting with the vertigo events again suggestive of a peripheral origin of vertigo.  The patient indicates that she will be having physical therapy come out for vestibular rehabilitation which is appropriate.  If this does not occur, the patient will call me and I will try to get this set up.  The patient was given a prescription for Phenergan to take if needed.  She will follow-up in 3 to 4 months.  The patient should be engaging in the Epley maneuvers off and on over the next several weeks.  Jill Alexanders MD 12/29/2017 10:57 AM  [facility] [address] [place], [place] [address]  Phone [phone] Fax [phone]

## 2017-12-30 DIAGNOSIS — Z86010 Personal history of colonic polyps: Secondary | ICD-10-CM | POA: Diagnosis not present

## 2017-12-30 DIAGNOSIS — Z9181 History of falling: Secondary | ICD-10-CM | POA: Diagnosis not present

## 2017-12-30 DIAGNOSIS — Z7982 Long term (current) use of aspirin: Secondary | ICD-10-CM | POA: Diagnosis not present

## 2017-12-30 DIAGNOSIS — J449 Chronic obstructive pulmonary disease, unspecified: Secondary | ICD-10-CM | POA: Diagnosis not present

## 2017-12-30 DIAGNOSIS — M8580 Other specified disorders of bone density and structure, unspecified site: Secondary | ICD-10-CM | POA: Diagnosis not present

## 2017-12-30 DIAGNOSIS — R2231 Localized swelling, mass and lump, right upper limb: Secondary | ICD-10-CM | POA: Diagnosis not present

## 2017-12-30 DIAGNOSIS — M170 Bilateral primary osteoarthritis of knee: Secondary | ICD-10-CM | POA: Diagnosis not present

## 2017-12-30 DIAGNOSIS — H8110 Benign paroxysmal vertigo, unspecified ear: Secondary | ICD-10-CM | POA: Diagnosis not present

## 2017-12-30 DIAGNOSIS — M160 Bilateral primary osteoarthritis of hip: Secondary | ICD-10-CM | POA: Diagnosis not present

## 2017-12-30 DIAGNOSIS — Z85828 Personal history of other malignant neoplasm of skin: Secondary | ICD-10-CM | POA: Diagnosis not present

## 2017-12-30 DIAGNOSIS — N6019 Diffuse cystic mastopathy of unspecified breast: Secondary | ICD-10-CM | POA: Diagnosis not present

## 2017-12-30 DIAGNOSIS — M47812 Spondylosis without myelopathy or radiculopathy, cervical region: Secondary | ICD-10-CM | POA: Diagnosis not present

## 2018-01-24 DIAGNOSIS — I2510 Atherosclerotic heart disease of native coronary artery without angina pectoris: Secondary | ICD-10-CM | POA: Insufficient documentation

## 2018-01-24 NOTE — Progress Notes (Signed)
Cardiology Office Note:    Date:  01/25/2018   ID:  Becky Mccall, DOB Jul 04, 1929, MRN [id number]  PCP:  [name], MD  Cardiologist:  Fransico Him, MD    Referring MD: [name], MD   Chief Complaint  Patient presents with  . Congestive Heart Failure  . Coronary Artery Disease  . Hyperlipidemia  . Aortic Stenosis    History of Present Illness:    Becky Mccall is a 84 y.o. female with a hx of HTN, chronic diastolic CHF, COPD, HLD, mild nonobstructive CAD (30% prox RCA, 40% ostial RPDA, 45% D1, 45% dLAD), chronic dizziness and severe aortic stenosiss/p TAVR with 26 mm Edwards Sapien 3 THV placed via open left transcarotid approach. (12/07/16).  1 month echo showedEF 55-60%and normally functioning TAVR valve with mean/peak gradient 11 and 22 mm Hg respectively.  She has been seen in the clinic for ongoing dizziness, which has been a chronic issue for her. Heart monitor in 02/2017 showed NSR with an ave HR of 73 bpm.  She was admitted in 10/2017 for BPPV. She had positive nystagmus. MRI brain and MRA head and neck were unremarkable. She had improvement with meclizine and valium   She is here today for followup and is doing well.  She denies any chest pain or pressure, SOB, DOE, PND, orthopnea, LE edema, dizziness, palpitations or syncope. She is compliant with her meds and is tolerating meds with no SE.    Past Medical History:  Diagnosis Date  . Brain aneurysm   . CAD in native artery    a.  [facility] 11/15/16 showing normal LVEDP, mild nonobstructive CAD (30% prox RCA, 40% ostial RPDA, 45% D1, 45% dLAD), severe AS with mean gradient 40.16mmHg, AVA 0.76cm2.   . Carotid stenosis    a. 1-39% by duplex 2018.  Marland Kitchen Cervical spondylolysis   . Chronic lower back pain   . CKD (chronic kidney disease), stage II   . COPD (chronic obstructive pulmonary disease) ([place])    "mild" (12/09/2016)  . DDD (degenerative disc disease), lumbar   . GERD (gastroesophageal reflux disease)    . Hard of hearing    wears hearing aides both ears  . Hyperlipidemia    can't take the meds so takes Fish Oil  . Hypertension   . Hypothyroidism   . IBS (irritable bowel syndrome)   . Intermittent dysphagia    for pills and solids  . Osteopenia   . S/P TAVR (transcatheter aortic valve replacement) 12/07/2016   26 mm Edwards Sapien 3 transcatheter heart valve placed via open left transcarotid approach   . Severe aortic stenosis    a. s/p TAVR 11/2016.  Marland Kitchen Vertigo     Past Surgical History:  Procedure Laterality Date  . ABDOMINAL HYSTERECTOMY    . APPENDECTOMY    . BACK SURGERY  2009   "on her lower back; not sure what they did" (12/09/2016)  . BLEPHAROPLASTY Bilateral   . CARDIAC CATHETERIZATION    . CARDIAC VALVE REPLACEMENT  12/07/2016   Edwards Sapien 3 THV (size 26 mm, model # 9600TFX, serial # [id number]  . CATARACT EXTRACTION W/ INTRAOCULAR LENS  IMPLANT, BILATERAL  1999,2000  . CATARACT EXTRACTION, BILATERAL Bilateral   . COLONOSCOPY W/ BIOPSIES AND POLYPECTOMY  11/2003   Archie Endo 07/08/2010  . EYE SURGERY    . HEMORROIDECTOMY    . LUMBAR LAMINECTOMY  1999  . LUMBAR LAMINECTOMY/DECOMPRESSION MICRODISCECTOMY  09/1999   Archie Endo 07/08/2010   . POSTERIOR FUSION LUMBAR  SPINE  2014  . RIGHT/LEFT HEART CATH AND CORONARY ANGIOGRAPHY N/A 11/15/2016   Procedure: RIGHT/LEFT HEART CATH AND CORONARY ANGIOGRAPHY;  Surgeon: [name], MD;  Location: [facility];  Service: Cardiovascular;  Laterality: N/A;  . SKIN GRAFT SPLIT THICKNESS LEG / FOOT Left 09/2000   Archie Endo 07/08/2010  . TEE WITHOUT CARDIOVERSION N/A 12/07/2016   Procedure: TRANSESOPHAGEAL ECHOCARDIOGRAM (TEE);  Surgeon: [name], MD;  Location: [facility];  Service: Open Heart Surgery;  Laterality: N/A;  . TONSILLECTOMY    . TRANSCATHETER AORTIC VALVE REPLACEMENT, TRANSFEMORAL  12/07/2016   Edwards Sapien 3 THV (size 26 mm, model # 9600TFX, serial # [id number]    Current Medications: Current Meds  Medication  Sig  . amLODipine (NORVASC) 2.5 MG tablet Take 2.5 mg by mouth daily.  Marland Kitchen aspirin EC 81 MG tablet Take 1 tablet (81 mg total) by mouth daily.  . Calcium Carb-Cholecalciferol (CALCIUM 1000 + D PO) Take 1 tablet by mouth daily.   . Cholecalciferol (VITAMIN D) 2000 UNITS tablet Take 6,000 Units by mouth daily.   . Cinnamon 500 MG capsule Take 500 mg by mouth 2 (two) times daily.   Marland Kitchen levothyroxine (SYNTHROID, LEVOTHROID) 137 MCG tablet Take 137 mcg by mouth daily before breakfast.  . meclizine (ANTIVERT) 12.5 MG tablet Take 1 tablet (12.5 mg total) by mouth 3 (three) times daily as needed for dizziness.  . Multiple Vitamin (MULTIVITAMIN WITH MINERALS) TABS tablet Take 1 tablet by mouth daily.  . Omega-3 Fatty Acids (FISH OIL) 1000 MG CAPS Take 1,000 mg by mouth 2 (two) times daily.  . promethazine (PHENERGAN) 12.5 MG tablet Take 1 tablet (12.5 mg total) by mouth every 6 (six) hours as needed for nausea or vomiting.  . tiotropium (SPIRIVA) 18 MCG inhalation capsule Place 18 mcg into inhaler and inhale daily.  . traMADol (ULTRAM) 50 MG tablet Take 1 tablet (50 mg total) by mouth every 8 (eight) hours as needed (pain not relieved by tylenol).  . Turmeric 500 MG CAPS Take 500 mg by mouth 2 (two) times daily.  . vitamin B-12 (CYANOCOBALAMIN) 1000 MCG tablet Take 1,000 mcg by mouth daily.  . vitamin C (ASCORBIC ACID) 500 MG tablet Take 500 mg by mouth daily.  . vitamin E 400 UNIT capsule Take 400 Units by mouth 2 (two) times daily.      Allergies:   Lisinopril; Relafen [nabumetone]; Statins; Tape; Welchol [colesevelam]; Zetia [ezetimibe]; Neosporin [neomycin-bacitracin zn-polymyx]; and Zinc oxide   Social History   Socioeconomic History  . Marital status: Widowed    Spouse name: irvin  . Number of children: 2  . Years of education: 10th  . Highest education level: Not on file  Occupational History  . Occupation: retired  Scientific laboratory technician  . Financial resource strain: Not on file  . Food insecurity:     Worry: Not on file    Inability: Not on file  . Transportation needs:    Medical: Not on file    Non-medical: Not on file  Tobacco Use  . Smoking status: Former Smoker    Packs/day: 2.00    Years: 40.00    Pack years: 80.00    Types: Cigarettes    Last attempt to quit: 08/02/1989    Years since quitting: 28.5  . Smokeless tobacco: Never Used  Substance and Sexual Activity  . Alcohol use: Yes    Alcohol/week: 14.0 standard drinks    Types: 7 Standard drinks or equivalent, 7 Glasses of wine per  week  . Drug use: No  . Sexual activity: Never    Birth control/protection: Surgical  Lifestyle  . Physical activity:    Days per week: Not on file    Minutes per session: Not on file  . Stress: Not on file  Relationships  . Social connections:    Talks on phone: Not on file    Gets together: Not on file    Attends religious service: Not on file    Active member of club or organization: Not on file    Attends meetings of clubs or organizations: Not on file    Relationship status: Not on file  Other Topics Concern  . Not on file  Social History Narrative   Patient lives at home with her husband   Patient drinks coffee, sodas   Patient has 2 children    Patient is right handed    Patient is retired           Family History: The patient's family history includes Cancer - Lung in her brother, maternal aunt, and mother; Heart attack in her father.  ROS:   Please see the history of present illness.    ROS  All other systems reviewed and negative.   EKGs/Labs/Other Studies Reviewed:    The following studies were reviewed today: none  EKG:  EKG is not ordered today.  Recent Labs: 10/11/2017: Magnesium 2.5 12/14/2017: BUN 17; Creatinine, Ser 0.93; Hemoglobin 14.0; Platelets 170; Potassium 3.7; Sodium 140   Recent Lipid Panel    Component Value Date/Time   CHOL 226 (H) 05/05/2013 0630   TRIG 149 05/05/2013 0630   HDL 51 05/05/2013 0630   CHOLHDL 4.4 05/05/2013 0630     VLDL 30 05/05/2013 0630   LDLCALC 145 (H) 05/05/2013 0630    Physical Exam:    VS:  BP 108/68   Pulse 73   Ht 5\' 6"  (1.676 m)   Wt 172 lb 6.4 oz (78.2 kg)   LMP  (LMP Unknown)   SpO2 94%   BMI 27.83 kg/m     Wt Readings from Last 3 Encounters:  01/25/18 172 lb 6.4 oz (78.2 kg)  12/29/17 168 lb (76.2 kg)  12/14/17 169 lb 12.1 oz (77 kg)     GEN:  Well nourished, well developed in no acute distress HEENT: Normal NECK: No JVD; No carotid bruits LYMPHATICS: No lymphadenopathy CARDIAC: RRR, no murmurs, rubs, gallops RESPIRATORY:  Clear to auscultation without rales, wheezing or rhonchi  ABDOMEN: Soft, non-tender, non-distended MUSCULOSKELETAL:  No edema; No deformity  SKIN: Warm and dry NEUROLOGIC:  Alert and oriented x 3 PSYCHIATRIC:  Normal affect   ASSESSMENT:    1. Severe aortic stenosis   2. Essential hypertension   3. Bilateral carotid artery stenosis   4. Coronary artery disease involving native coronary artery of native heart without angina pectoris   5. Pure hypercholesterolemia    PLAN:    In order of problems listed above:  1.  Severe AS - s/p TAVR with 26 mm Edwards Sapien 3 THV placed via open left transcarotid approach 11/2016. She will continue on ASA.  Last echo 11/2017 showed stable TAVR with mean AVG 44mmhg with no perivalvular AI.  2.  HTN - BP is controlled on exam today.  She will continue on amlodipine 2.5mg  daily.  3.  Left carotid artery stenosis - dopplers 11/2016 showed 1-39% left carotid stenosis.  She will continue on ASA.   4.  ASCAD - nonobstructive with cath  showing 30% prox RCA, 40% ostial RPDA, 45% D1, 45% dLAD.  She denies any anginal sx.  She will continue on ASA.  She is statin intolerant.    5.  Hyperlipidemia - LDL goal is < 70.  She is statin intolerant.  Medication Adjustments/Labs and Tests Ordered: Current medicines are reviewed at length with the patient today.  Concerns regarding medicines are outlined above.  No  orders of the defined types were placed in this encounter.  No orders of the defined types were placed in this encounter.   Signed, Fransico Him, MD  01/25/2018 9:41 AM    [place]

## 2018-01-25 ENCOUNTER — Encounter (INDEPENDENT_AMBULATORY_CARE_PROVIDER_SITE_OTHER): Payer: Self-pay

## 2018-01-25 ENCOUNTER — Ambulatory Visit: Payer: Medicare Other | Admitting: Cardiology

## 2018-01-25 ENCOUNTER — Encounter: Payer: Self-pay | Admitting: Cardiology

## 2018-01-25 VITALS — BP 108/68 | HR 73 | Ht 66.0 in | Wt 172.4 lb

## 2018-01-25 DIAGNOSIS — I350 Nonrheumatic aortic (valve) stenosis: Secondary | ICD-10-CM | POA: Diagnosis not present

## 2018-01-25 DIAGNOSIS — I2510 Atherosclerotic heart disease of native coronary artery without angina pectoris: Secondary | ICD-10-CM | POA: Diagnosis not present

## 2018-01-25 DIAGNOSIS — I6523 Occlusion and stenosis of bilateral carotid arteries: Secondary | ICD-10-CM

## 2018-01-25 DIAGNOSIS — I10 Essential (primary) hypertension: Secondary | ICD-10-CM | POA: Diagnosis not present

## 2018-01-25 DIAGNOSIS — E7800 Pure hypercholesterolemia, unspecified: Secondary | ICD-10-CM

## 2018-01-25 NOTE — Patient Instructions (Signed)
Medication Instructions:  Your physician recommends that you continue on your current medications as directed. Please refer to the Current Medication list given to you today.  If you need a refill on your cardiac medications before your next appointment, please call your pharmacy.   Lab work:  If you have labs (blood work) drawn today and your tests are completely normal, you will receive your results only by: . MyChart Message (if you have MyChart) OR . A paper copy in the mail If you have any lab test that is abnormal or we need to change your treatment, we will call you to review the results.  Follow-Up: At [facility], you and your health needs are our priority.  As part of our continuing mission to provide you with exceptional heart care, we have created designated Provider Care Teams.  These Care Teams include your primary Cardiologist (physician) and Advanced Practice Providers (APPs -  Physician Assistants and Nurse Practitioners) who all work together to provide you with the care you need, when you need it. You will need a follow up appointment in 1 years.  Please call our office 2 months in advance to schedule this appointment.  You may see Traci Turner, MD or one of the following Advanced Practice Providers on your designated Care Team:   Brittainy Simmons, PA-C Dayna Dunn, PA-C . Michele Lenze, PA-C    

## 2018-02-21 DIAGNOSIS — R2232 Localized swelling, mass and lump, left upper limb: Secondary | ICD-10-CM | POA: Diagnosis not present

## 2018-03-15 ENCOUNTER — Telehealth: Payer: Self-pay | Admitting: *Deleted

## 2018-03-15 ENCOUNTER — Ambulatory Visit (INDEPENDENT_AMBULATORY_CARE_PROVIDER_SITE_OTHER): Payer: Self-pay

## 2018-03-15 ENCOUNTER — Ambulatory Visit (INDEPENDENT_AMBULATORY_CARE_PROVIDER_SITE_OTHER): Payer: Medicare Other | Admitting: Orthopaedic Surgery

## 2018-03-15 ENCOUNTER — Encounter (INDEPENDENT_AMBULATORY_CARE_PROVIDER_SITE_OTHER): Payer: Self-pay | Admitting: Orthopaedic Surgery

## 2018-03-15 VITALS — Ht 66.0 in | Wt 172.0 lb

## 2018-03-15 DIAGNOSIS — M1712 Unilateral primary osteoarthritis, left knee: Secondary | ICD-10-CM | POA: Diagnosis not present

## 2018-03-15 NOTE — Progress Notes (Signed)
Office Visit Note   Patient: Becky Mccall           Date of Birth: [birth year]           MRN: [id number] Visit Date: 03/15/2018              Requested by: [name], MD [address] [place], [place] [address] PCP: [name], MD   Assessment & Plan: Visit Diagnoses:  1. Primary osteoarthritis of left knee     Plan: Due to the fact the patient is failed conservative treatment which is included time,assistive devices and injections and continues to have severe pain that is greatly affecting her life recommend left total knee arthroplasty.  Will need cardiac clearance due to her significant cardiac history.  Risk benefits discussed with patient at length by Dr. Ninfa Linden self.  Risk include but are not limited to DVT/PE, wound healing problems, prolonged pain, worsening pain, nerve and vessel injury.  Postoperative protocol discussed with patient at length.  Questions were encouraged and answered by Dr. Ninfa Linden myself.  Should follow-up 2 weeks postop.  Patient does wish to go to [facility] in [place]  postop.   Follow-Up Instructions: Return for 2 weeks post op.   Orders:  Orders Placed This Encounter  Procedures  . XR Knee 1-2 Views Left   No orders of the defined types were placed in this encounter.     Procedures: No procedures performed   Clinical Data: No additional findings.   Subjective: Chief Complaint  Patient presents with  . Left Knee - Pain    HPI Becky Mccall is well-known to Dr. Ninfa Linden service has known osteoarthritis of both knees.  She comes in today due to severe left knee pain despite conservative treatment.  She states that the supplemental injections did not help at all with her knee pain that the cortisone injections last for very short period time.  She is interested in undergoing left total knee surgery due to the fact that it causes her significant pain that interferes with her activities of daily living.   Does have a significant cardiac history and was recently seen the cardiologist and states that her cardiologist felt that she could proceed with total knee surgery from their standpoint.  She has had no new injury to the knee. Review of Systems  Constitutional: Negative for chills and fever.  Musculoskeletal: Positive for arthralgias.     Objective: Vital Signs: Ht 5\' 6"  (1.676 m)   Wt 172 lb (78 kg)   LMP  (LMP Unknown)   BMI 27.76 kg/m   Physical Exam Constitutional:      Appearance: She is not ill-appearing or diaphoretic.  Pulmonary:     Effort: Pulmonary effort is normal.  Neurological:     Mental Status: She is alert and oriented to person, place, and time.     Ortho Exam Left knee no effusion abnormal warmth erythema.  Valgus deformity.  No instability valgus varus stressing calf supple nontender.  Good range of motion of the knee with patellofemoral crepitus. Specialty Comments:  No specialty comments available.  Imaging: Xr Knee 1-2 Views Left  Result Date: 03/15/2018 AP lateral views left knee: Shows tricompartmental arthritic changes.  Chondrocalcinosis.  Moderate medial compartmental narrowing moderate to severe patellofemoral and lateral compartmental changes.  No acute fractures.     PMFS History: Patient Active Problem List   Diagnosis Date Noted  . CAD (coronary artery disease), native coronary artery 01/24/2018  .  PVC's (premature ventricular contractions) 10/11/2017  . Leg cramps 10/11/2017  . Essential hypertension 10/10/2017  . Chronic pain of right knee 03/17/2017  . Unilateral primary osteoarthritis, left knee 03/17/2017  . Unilateral primary osteoarthritis, right knee 03/17/2017  . Chronic pain of left knee 02/07/2017  . S/P TAVR (transcatheter aortic valve replacement) 12/07/2016  . Severe aortic stenosis 12/07/2016  . Fibrocystic breast disease   . IBS (irritable bowel syndrome)   . Cervical spondylolysis   . Osteopenia   . Vitamin D  deficiency   . DDD (degenerative disc disease), lumbar   . Squamous cell skin cancer   . Carotid stenosis   . OSA (obstructive sleep apnea)   . Colon polyp   . Vertigo 05/04/2013  . Hard of hearing   . Hyperlipidemia   . COPD (chronic obstructive pulmonary disease) ([place])   . Hypothyroidism   . GERD (gastroesophageal reflux disease)   . Spinal stenosis in cervical region 02/19/2013   Past Medical History:  Diagnosis Date  . Brain aneurysm   . CAD in native artery    a.  [facility] 11/15/16 showing normal LVEDP, mild nonobstructive CAD (30% prox RCA, 40% ostial RPDA, 45% D1, 45% dLAD), severe AS with mean gradient 40.75mmHg, AVA 0.76cm2.   . Carotid stenosis    a. 1-39% by duplex 2018.  Marland Kitchen Cervical spondylolysis   . Chronic lower back pain   . CKD (chronic kidney disease), stage II   . COPD (chronic obstructive pulmonary disease) ([place])    "mild" (12/09/2016)  . DDD (degenerative disc disease), lumbar   . GERD (gastroesophageal reflux disease)   . Hard of hearing    wears hearing aides both ears  . Hyperlipidemia    can't take the meds so takes Fish Oil  . Hypertension   . Hypothyroidism   . IBS (irritable bowel syndrome)   . Intermittent dysphagia    for pills and solids  . Osteopenia   . S/P TAVR (transcatheter aortic valve replacement) 12/07/2016   26 mm Edwards Sapien 3 transcatheter heart valve placed via open left transcarotid approach   . Severe aortic stenosis    a. s/p TAVR 11/2016.  Marland Kitchen Vertigo     Family History  Problem Relation Age of Onset  . Cancer - Lung Mother   . Heart attack Father   . Cancer - Lung Brother   . Cancer - Lung Maternal Aunt     Past Surgical History:  Procedure Laterality Date  . ABDOMINAL HYSTERECTOMY    . APPENDECTOMY    . BACK SURGERY  2009   "on her lower back; not sure what they did" (12/09/2016)  . BLEPHAROPLASTY Bilateral   . CARDIAC CATHETERIZATION    . CARDIAC VALVE REPLACEMENT  12/07/2016   Edwards Sapien 3 THV (size 26 mm,  model # 9600TFX, serial # [id number]  . CATARACT EXTRACTION W/ INTRAOCULAR LENS  IMPLANT, BILATERAL  1999,2000  . CATARACT EXTRACTION, BILATERAL Bilateral   . COLONOSCOPY W/ BIOPSIES AND POLYPECTOMY  11/2003   Archie Endo 07/08/2010  . EYE SURGERY    . HEMORROIDECTOMY    . LUMBAR LAMINECTOMY  1999  . LUMBAR LAMINECTOMY/DECOMPRESSION MICRODISCECTOMY  09/1999   Archie Endo 07/08/2010   . POSTERIOR FUSION LUMBAR SPINE  2014  . RIGHT/LEFT HEART CATH AND CORONARY ANGIOGRAPHY N/A 11/15/2016   Procedure: RIGHT/LEFT HEART CATH AND CORONARY ANGIOGRAPHY;  Surgeon: [name], MD;  Location: [facility];  Service: Cardiovascular;  Laterality: N/A;  . SKIN GRAFT SPLIT THICKNESS  LEG / FOOT Left 09/2000   Archie Endo 07/08/2010  . TEE WITHOUT CARDIOVERSION N/A 12/07/2016   Procedure: TRANSESOPHAGEAL ECHOCARDIOGRAM (TEE);  Surgeon: [name], MD;  Location: [facility];  Service: Open Heart Surgery;  Laterality: N/A;  . TONSILLECTOMY    . TRANSCATHETER AORTIC VALVE REPLACEMENT, TRANSFEMORAL  12/07/2016   Edwards Sapien 3 THV (size 26 mm, model # 9600TFX, serial # [id number]   Social History   Occupational History  . Occupation: retired  Tobacco Use  . Smoking status: Former Smoker    Packs/day: 2.00    Years: 40.00    Pack years: 80.00    Types: Cigarettes    Last attempt to quit: 08/02/1989    Years since quitting: 28.6  . Smokeless tobacco: Never Used  Substance and Sexual Activity  . Alcohol use: Yes    Alcohol/week: 14.0 standard drinks    Types: 7 Standard drinks or equivalent, 7 Glasses of wine per week  . Drug use: No  . Sexual activity: Never    Birth control/protection: Surgical

## 2018-03-15 NOTE — Telephone Encounter (Signed)
OK to be off ASA for surgery

## 2018-03-15 NOTE — Telephone Encounter (Signed)
   [facility] Pre-operative Risk Assessment    Request for surgical clearance:  1. What type of surgery is being performed? LEFT TOTAL KNEE ARTHROPLASTY   2. When is this surgery scheduled? TBD   3. What type of clearance is required (medical clearance vs. Pharmacy clearance to hold med vs. Both)? MEDICAL  4. Are there any medications that need to be held prior to surgery and how long?ASA   5. Practice name and name of physician performing surgery? [facility]; DR. Ninfa Linden   6. What is your office phone number [phone]    7.   What is your office fax number [phone]  8.   Anesthesia type (None, local, MAC, general) ? CHOICE (GENERAL vs SPINAL)   Julaine Hua 03/15/2018, 2:54 PM  _________________________________________________________________   (provider comments below)

## 2018-03-15 NOTE — Telephone Encounter (Signed)
   Primary Cardiologist: Fransico Him, MD  Chart reviewed as part of pre-operative protocol coverage. Patient was contacted 03/15/2018 in reference to pre-operative risk assessment for pending surgery as outlined below. She has a h/o severe AS and s/p TAVR with recent echo showing normal functioning prosthesis. Also h/o nonobstructive CAD and mild carotid artery disease.  Becky Mccall was last seen on 01/25/18 by [name].  Since that day, Becky Mccall has done well w/o any cardiac symptoms. No anginal symptomatology. She denies CP and dyspnea. No exertional symptoms.   Therefore, based on ACC/AHA guidelines, the patient would be at acceptable risk for the planned procedure without further cardiovascular testing.   Will route to Dr. [name] to see if ok to hold ASA, per ortho's request.   Lyda Jester, PA-C 03/15/2018, 4:04 PM

## 2018-03-17 NOTE — Telephone Encounter (Signed)
   Primary Cardiologist: Fransico Him, MD  Chart reviewed as part of pre-operative protocol coverage. Given past medical history and time since last visit, based on ACC/AHA guidelines, Becky Mccall would be at acceptable risk for the planned procedure without further cardiovascular testing.   OK to hold aspirin 3-5 days pre op if needed.  I will route this recommendation to the requesting party via Epic fax function and remove from pre-op pool.  Please call with questions.  Kerin Ransom, PA-C 03/17/2018, 1:12 PM

## 2018-03-31 NOTE — Pre-Procedure Instructions (Signed)
DELOMA SPINDLE  03/31/2018      PRIMEMAIL (MAIL ORDER) ELECTRONIC - Shaune Leeks, [place] [address] [place] [address] Phone: [phone] Fax: [phone]  [facility] [phone] Lady Gary, [facility] [address] AT [place] [address] Phone: [phone] Fax: [phone]    Your procedure is scheduled on Tuesday, February 18..  Report to [facility] Admitting at 12:10 PM                Your surgery or procedure is scheduled for 2:10 PM   Call this number if you have problems the morning of surgery: .     Remember:  Do not eat or drink after midnight Monday, February 17.   Take these medicines the morning of surgery with A SIP OF WATER:  amLODipine (NORVASC)              levothyroxine (SYNTHROID, LEVOTHROID)             traMADol (ULTRAM)         Use tiotropium (SPIRIVA) inhaler              Take if needed:  meclizine (ANTIVERT) SYSTANE Eye Drop promethazine (PHENERGAN)  traMADol (ULTRAM)  Follow Dr. Ninfa Linden instructions regarding Aspirin. 1 Week prior to surgery STOP taking  Aspirin Products (Goody Powder, Excedrin Migraine), Ibuprofen (Advil), Naproxen (Aleve), Vitamins and Herbal Products (ie Fish Oil).  Special instructions:  [place]- Preparing For Surgery  Before surgery, you can play an important role. Because skin is not sterile, your skin needs to be as free of germs as possible. You can reduce the number of germs on your skin by washing with CHG (chlorahexidine gluconate) Soap before surgery.  CHG is an antiseptic cleaner which kills germs and bonds with the skin to continue killing germs even after washing.    Oral Hygiene is also important to reduce your risk of infection.  Remember - BRUSH YOUR TEETH THE MORNING OF SURGERY WITH YOUR REGULAR TOOTHPASTE  Please do not use if you have an allergy to CHG or antibacterial soaps. If  your skin becomes reddened/irritated stop using the CHG.  Do not shave (including legs and underarms) for at least 48 hours prior to first CHG shower. It is OK to shave your face.  Please follow these instructions carefully.   1. Shower the NIGHT BEFORE SURGERY and the MORNING OF SURGERY with CHG.   2. If you chose to wash your hair, wash your hair first as usual with your normal shampoo.  3. After you shampoo,wash your face and private area with the soap you use at home, then rinse your hair and body thoroughly to remove the shampoo and soap.   4. Use CHG as you would any other liquid soap. You can apply CHG directly to the skin and wash gently with a scrungie or a clean washcloth.  Apply the CHG Soap to your body ONLY FROM THE NECK DOWN.  Do not use on open wounds or open sores. Avoid contact with your eyes, ears, mouth and genitals (private parts).  5. Wash thoroughly, paying special attention to the area where your surgery will be performed.  6. Thoroughly rinse your body with warm water from the neck down.  7. DO NOT shower/wash with your normal soap after using and rinsing off the CHG Soap.  8. Pat yourself dry with a CLEAN TOWEL.  9. Wear CLEAN PAJAMAS to bed the night before surgery, wear comfortable clothes the morning of surgery  10. Place CLEAN SHEETS on your bed the night of your first shower and DO NOT SLEEP WITH PETS.  Day of Surgery: Follow shower instructions above Do not wear lotions, powders, or perfumes, or deodorant. Please wear clean clothes to the hospital/surgery center.   Remember to brush your teeth WITH YOUR REGULAR TOOTHPASTE.             Do not wear jewelry, make-up or nail polish.  Do not shave 48 hours prior to surgery.  Men may shave face and neck.  Do not bring valuables to the hospital.  [facility] is not responsible for any belongings or valuables.  Contacts, dentures or bridgework may not be worn into surgery.  Leave your suitcase in the car.   After surgery it may be brought to your room.  For patients admitted to the hospital, discharge time will be determined by your treatment team.  Patients discharged the day of surgery will not be allowed to drive home.   Please read over the following fact sheets that you were given.

## 2018-04-02 ENCOUNTER — Other Ambulatory Visit (INDEPENDENT_AMBULATORY_CARE_PROVIDER_SITE_OTHER): Payer: Self-pay | Admitting: Physician Assistant

## 2018-04-03 ENCOUNTER — Encounter (HOSPITAL_COMMUNITY): Payer: Self-pay

## 2018-04-03 ENCOUNTER — Encounter (HOSPITAL_COMMUNITY)
Admission: RE | Admit: 2018-04-03 | Discharge: 2018-04-03 | Disposition: A | Discharge status: Home / Self Care | Payer: Medicare Other | Source: Ambulatory Visit | Attending: Orthopaedic Surgery | Admitting: Orthopaedic Surgery

## 2018-04-03 ENCOUNTER — Other Ambulatory Visit: Payer: Self-pay

## 2018-04-03 DIAGNOSIS — Z01812 Encounter for preprocedural laboratory examination: Secondary | ICD-10-CM | POA: Diagnosis not present

## 2018-04-03 LAB — BASIC METABOLIC PANEL
ANION GAP: 11 (ref 5–15)
BUN: 12 mg/dL (ref 8–23)
CALCIUM: 9.3 mg/dL (ref 8.9–10.3)
CO2: 26 mmol/L (ref 22–32)
Chloride: 103 mmol/L (ref 98–111)
Creatinine, Ser: 1.06 mg/dL — ABNORMAL HIGH (ref 0.44–1.00)
GFR calc Af Amer: 55 mL/min — ABNORMAL LOW (ref >60)
GFR, EST NON AFRICAN AMERICAN: 48 mL/min — AB (ref >60)
Glucose, Bld: 96 mg/dL (ref 70–99)
POTASSIUM: 4 mmol/L (ref 3.5–5.1)
Sodium: 140 mmol/L (ref 135–145)

## 2018-04-03 LAB — CBC
HCT: 43.8 % (ref 36.0–46.0)
Hemoglobin: 14.2 g/dL (ref 12.0–15.0)
MCH: 30 pg (ref 26.0–34.0)
MCHC: 32.4 g/dL (ref 30.0–36.0)
MCV: 92.4 fL (ref 80.0–100.0)
PLATELETS: 186 10*3/uL (ref 150–400)
RBC: 4.74 MIL/uL (ref 3.87–5.11)
RDW: 13.7 % (ref 11.5–15.5)
WBC: 5.5 10*3/uL (ref 4.0–10.5)
nRBC: 0 % (ref 0.0–0.2)

## 2018-04-03 LAB — SURGICAL PCR SCREEN
MRSA, PCR: NEGATIVE
Staphylococcus aureus: NEGATIVE

## 2018-04-03 NOTE — Progress Notes (Signed)
PCP - Dr. [name] LOV 12/2017 Cardiologist - Dr. Fransico Him   LOV 01/2018    Chest x-ray -   EKG - 09/2017  Stress Test - 2014 ECHO - 2019 Cardiac Cath - 2018  Sleep Study - 2015 CPAP - n/a  Fasting Blood Sugar - n/a Checks Blood Sugar _____ times a day  Blood Thinner Instructions: Aspirin Instructions: 5 days prior to surgery, (LD 2/13) per Dr. [name].  Anesthesia review:   Patient denies shortness of breath, fever, cough and chest pain at PAT appointment   Patient verbalized understanding of instructions that were given to them at the PAT appointment. Patient was also instructed that they will need to review over the PAT instructions again at home before surgery.

## 2018-04-03 NOTE — Pre-Procedure Instructions (Signed)
VEIDA SPIRA  04/03/2018      PRIMEMAIL (MAIL ORDER) ELECTRONIC - Shaune Leeks, [place] [address] [place] [address] Phone: [phone] Fax: [phone]  [facility] [phone] Lady Gary, [place] [facility] [address] AT [place] [address] Phone: [phone] Fax: [phone]    Your procedure is scheduled on Tuesday, February 18..  Report to [facility] Admitting at 12:10 PM                Your surgery or procedure is scheduled for 2:10 PM   Call this number if you have problems the morning of surgery: . [phone]     Remember:  Do not eat any foods or drink any liquids after midnight Monday, February 17.   Take these medicines the morning of surgery with A SIP OF WATER:  amLODipine (NORVASC)              levothyroxine (SYNTHROID, LEVOTHROID)             traMADol (ULTRAM)         Use tiotropium (SPIRIVA) inhaler              Take if needed:  meclizine (ANTIVERT) SYSTANE Eye Drop promethazine (PHENERGAN)  traMADol (ULTRAM)  Follow Dr. Ninfa Linden instructions regarding Aspirin. 1 Week prior to surgery STOP taking  Aspirin Products (Goody Powder, Excedrin Migraine), Ibuprofen (Advil), Naproxen (Aleve), Vitamins and Herbal Products (ie Fish Oil).   Day of Surgery:  Do not wear lotions, powders, or perfumes, or deodorant. Please wear clean clothes to the hospital/surgery center.   Remember to brush your teeth WITH YOUR REGULAR TOOTHPASTE.             Do not wear jewelry, make-up or nail polish.  Do not shave 48 hours prior to surgery.  Men may shave face and neck.  Do not bring valuables to the hospital.  [facility] is not responsible for any belongings or valuables.  Contacts, dentures or bridgework may not be worn into surgery.  Leave your suitcase in the car.  After surgery it may be brought to your room.  For patients admitted to the  hospital, discharge time will be determined by your treatment team.  Patients discharged the day of surgery will not be allowed to drive home.   Please read over the following fact sheets that you were given.           [place]- Preparing For Surgery  Before surgery, you can play an important role. Because skin is not sterile, your skin needs to be as free of germs as possible. You can reduce the number of germs on your skin by washing with CHG (chlorahexidine gluconate) Soap before surgery.  CHG is an antiseptic cleaner which kills germs and bonds with the skin to continue killing germs even after washing.    Oral Hygiene is also important to reduce your risk of infection.    Remember - BRUSH YOUR TEETH THE MORNING OF SURGERY WITH YOUR REGULAR TOOTHPASTE  Please do not use if you have an allergy to CHG or antibacterial soaps. If your skin becomes reddened/irritated stop using the CHG.  Do not shave (including legs and underarms) for at least 48 hours prior to first CHG shower. It is OK to shave your face.  Please follow these instructions carefully.   1. Shower the NIGHT BEFORE SURGERY and the  MORNING OF SURGERY with CHG.   2. If you chose to wash your hair, wash your hair first as usual with your normal shampoo.  3. After you shampoo,wash your face and private area with the soap you use at home, then rinse your hair and body thoroughly to remove the shampoo and soap.   4. Use CHG as you would any other liquid soap. You can apply CHG directly to the skin and wash gently with a scrungie or a clean washcloth.  Apply the CHG Soap to your body ONLY FROM THE NECK DOWN.  Do not use on open wounds or open sores. Avoid contact with your eyes, ears, mouth and genitals (private parts).  5. Wash thoroughly, paying special attention to the area where your surgery will be performed.  6. Thoroughly rinse your body with warm water from the neck down.  7. DO NOT shower/wash with your  normal soap after using and rinsing off the CHG Soap.  8. Pat yourself dry with a CLEAN TOWEL.  9. Wear CLEAN PAJAMAS to bed the night before surgery, wear comfortable clothes the morning of surgery  10. Place CLEAN SHEETS on your bed the night of your first shower and DO NOT SLEEP WITH PETS.

## 2018-04-04 NOTE — Progress Notes (Signed)
Anesthesia Chart Review:  Case:  [id number] Date/Time:  04/11/18 1355   Procedure:  LEFT TOTAL KNEE ARTHROPLASTY (Left )   Anesthesia type:  Choice   Pre-op diagnosis:  left knee osteoarthritis   Location:  [facility] OR ROOM 17 / [place] OR   Surgeon:  Mcarthur Rossetti, MD      DISCUSSION: 85 yo female former smoker. Pertinent hx includes HTN, chronic diastolic CHF (EF 35-59% by echo 2019), COPD (mild), HLD, mild nonobstructive CAD (30% prox RCA, 40% ostial RPDA, 45% D1, 45% dLAD), BPPV, Hypothyroid, CKD stage II, chronic dizziness,severe aortic stenosiss/p TAVR 2018 (valve functioning normally by echo 2019).   Cardiac clearance per telephone encounter 03/17/2018 by Kerin Ransom, PA-C: "Chart reviewed as part of pre-operative protocol coverage. Given past medical history and time since last visit, based on ACC/AHA guidelines, CHINAZA ROOKE would be at acceptable risk for the planned procedure without further cardiovascular testing."  Anticipate she can proceed as planned barring acute status change.   VS: BP (!) 137/59   Pulse 79   Temp 36.8 C   Resp 18   Ht 5\' 6"  (1.676 m)   Wt 79.7 kg   LMP  (LMP Unknown)   SpO2 95%   BMI 28.36 kg/m   PROVIDERS: [name], MD is PCP  Fransico Him, MD is Cardiologist  LABS: Labs reviewed: Acceptable for surgery. (all labs ordered are listed, but only abnormal results are displayed)  Labs Reviewed  BASIC METABOLIC PANEL - Abnormal; Notable for the following components:      Result Value   Creatinine, Ser 1.06 (*)    GFR calc non Af Amer 48 (*)    GFR calc Af Amer 55 (*)    All other components within normal limits  SURGICAL PCR SCREEN  CBC     IMAGES: MRI/MRA Head/neck 10/22/2017: IMPRESSION: MRI HEAD:  1. No acute intracranial process. 2. Mild-to-moderate chronic small vessel ischemic changes.  MRA HEAD:  1. Moderately motion degraded examination. No emergent large vessel occlusion or flow-limiting stenosis. 2. Stable  appearance of 2 mm LEFT cavernous ICA and 3 mm LEFT PCOM origin aneurysms. 3. Mild stenosis bilateral M1 segments.  MRA NECK:  1. No hemodynamically significant stenosis ICA's. 2. Mild stenosis LEFT vertebral artery origin. Patent bilateral vertebral arteries.   PORTABLE CHEST 1 VIEW 12/10/2016:  COMPARISON:  Single-view of the chest 12/08/2016 and 12/07/2016.  FINDINGS: Right IJ catheter has been removed Mild left basilar atelectasis seen on yesterday's examination is improved. The lungs are emphysematous but clear. Heart size is mildly enlarged. No pneumothorax or pleural effusion. Aortic atherosclerosis noted.  IMPRESSION: No acute disease.  Emphysema.  EKG: 10/21/2017: Sins rhythm. Rate 81. Prolonged PR interval. Borderline LAD. Early R wave transition.   CV: TTE 12/07/2017: Study Conclusions  - Left ventricle: The cavity size was normal. Wall thickness was   increased in a pattern of mild LVH. Systolic function was normal.   The estimated ejection fraction was in the range of 60% to 65%.   Wall motion was normal; there were no regional wall motion   abnormalities. Doppler parameters are consistent with abnormal   left ventricular relaxation (grade 1 diastolic dysfunction). - Aortic valve: Bioprosthetic aortic valve s/p TAVR. No significant   bioprosthetic valve stenosis. There was no regurgitation. Mean   gradient (S): 12 mm Hg. - Mitral valve: Mildly calcified annulus. There was trivial   regurgitation. - Left atrium: The atrium was moderately dilated. - Right ventricle: The cavity size  was normal. Systolic function   was normal. - Tricuspid valve: Peak RV-RA gradient (S): 29 mm Hg. - Pulmonary arteries: PA peak pressure: 32 mm Hg (S). - Inferior vena cava: The vessel was normal in size. The   respirophasic diameter changes were in the normal range (>= 50%),   consistent with normal central venous pressure.  Impressions:  - Normal LV size with  mild LV hypertrophy. EF 60-65%. Normal RV   size and systolic function. Bioprosthetic aortic valve s/p TAVR,   normally functioning valve. Normal RV size and systolic function.  Event monitor 03/16/2017:  Normal sinus rhythm with average heart rate 73bpm  Carotid US 11/24/2016: Summary: Bilateral - 1% to 39% ICA stenosis. Vertebral artery flow is antegrade.  Past Medical History:  Diagnosis Date  . Brain aneurysm   . CAD in native artery    a.  [facility] 11/15/16 showing normal LVEDP, mild nonobstructive CAD (30% prox RCA, 40% ostial RPDA, 45% D1, 45% dLAD), severe AS with mean gradient 40.49mmHg, AVA 0.76cm2.   . Carotid stenosis    a. 1-39% by duplex 2018.  Marland Kitchen Cervical spondylolysis   . Chronic lower back pain   . CKD (chronic kidney disease), stage II   . COPD (chronic obstructive pulmonary disease) ([place])    "mild" (12/09/2016)  . DDD (degenerative disc disease), lumbar   . GERD (gastroesophageal reflux disease)   . Hard of hearing    wears hearing aides both ears  . Hyperlipidemia    can't take the meds so takes Fish Oil  . Hypertension   . Hypothyroidism   . IBS (irritable bowel syndrome)   . Intermittent dysphagia    for pills and solids  . Osteopenia   . S/P TAVR (transcatheter aortic valve replacement) 12/07/2016   26 mm Edwards Sapien 3 transcatheter heart valve placed via open left transcarotid approach   . Severe aortic stenosis    a. s/p TAVR 11/2016.  Marland Kitchen Vertigo     Past Surgical History:  Procedure Laterality Date  . ABDOMINAL HYSTERECTOMY    . APPENDECTOMY    . BACK SURGERY  2009   "on her lower back; not sure what they did" (12/09/2016)  . BLEPHAROPLASTY Bilateral   . CARDIAC CATHETERIZATION    . CARDIAC VALVE REPLACEMENT  12/07/2016   Edwards Sapien 3 THV (size 26 mm, model # 9600TFX, serial # [id number]  . CATARACT EXTRACTION W/ INTRAOCULAR LENS  IMPLANT, BILATERAL  1999,2000  . CATARACT EXTRACTION, BILATERAL Bilateral   . COLONOSCOPY W/ BIOPSIES AND  POLYPECTOMY  11/2003   Archie Endo 07/08/2010  . EYE SURGERY    . HEMORROIDECTOMY    . LUMBAR LAMINECTOMY  1999  . LUMBAR LAMINECTOMY/DECOMPRESSION MICRODISCECTOMY  09/1999   Archie Endo 07/08/2010   . POSTERIOR FUSION LUMBAR SPINE  2014  . RIGHT/LEFT HEART CATH AND CORONARY ANGIOGRAPHY N/A 11/15/2016   Procedure: RIGHT/LEFT HEART CATH AND CORONARY ANGIOGRAPHY;  Surgeon: [name], MD;  Location: [facility];  Service: Cardiovascular;  Laterality: N/A;  . SKIN GRAFT SPLIT THICKNESS LEG / FOOT Left 09/2000   Archie Endo 07/08/2010  . TEE WITHOUT CARDIOVERSION N/A 12/07/2016   Procedure: TRANSESOPHAGEAL ECHOCARDIOGRAM (TEE);  Surgeon: [name], MD;  Location: [facility];  Service: Open Heart Surgery;  Laterality: N/A;  . TONSILLECTOMY    . TRANSCATHETER AORTIC VALVE REPLACEMENT, TRANSFEMORAL  12/07/2016   Edwards Sapien 3 THV (size 26 mm, model # 9600TFX, serial # [id number]    MEDICATIONS: . doxylamine, Sleep, (UNISOM) 25 MG  tablet  . tetrahydrozoline 0.05 % ophthalmic solution  . amLODipine (NORVASC) 2.5 MG tablet  . aspirin EC 81 MG tablet  . Calcium Carb-Cholecalciferol (CALCIUM 1000 + D PO)  . Cholecalciferol (VITAMIN D) 2000 UNITS tablet  . Cinnamon 500 MG capsule  . levothyroxine (SYNTHROID, LEVOTHROID) 137 MCG tablet  . magnesium oxide (MAG-OX) 400 MG tablet  . meclizine (ANTIVERT) 12.5 MG tablet  . Multiple Vitamin (MULTIVITAMIN WITH MINERALS) TABS tablet  . Omega-3 Fatty Acids (FISH OIL) 1000 MG CAPS  . Polyethyl Glycol-Propyl Glycol (SYSTANE OP)  . promethazine (PHENERGAN) 12.5 MG tablet  . tiotropium (SPIRIVA) 18 MCG inhalation capsule  . traMADol (ULTRAM) 50 MG tablet  . Turmeric 500 MG CAPS  . vitamin B-12 (CYANOCOBALAMIN) 1000 MCG tablet  . vitamin C (ASCORBIC ACID) 500 MG tablet  . vitamin E 400 UNIT capsule   No current facility-administered medications for this encounter.     Becky Mccall [facility] Short Stay Center/Anesthesiology Phone [phone] 04/04/2018 2:33 PM

## 2018-04-04 NOTE — Anesthesia Preprocedure Evaluation (Addendum)
Anesthesia Evaluation  Patient identified by MRN, date of birth, ID band Patient awake    Reviewed: Allergy & Precautions, NPO status , Patient's Chart, lab work & pertinent test results  Airway Mallampati: II       Dental  (+) Edentulous Upper, Edentulous Lower   Pulmonary former smoker,    Pulmonary exam normal        Cardiovascular hypertension, Pt. on medications + CAD  Normal cardiovascular exam Rhythm:Regular Rate:Normal     Neuro/Psych negative psych ROS   GI/Hepatic Neg liver ROS, GERD  Medicated and Controlled,  Endo/Other  Hypothyroidism   Renal/GU   negative genitourinary   Musculoskeletal  (+) Arthritis , Osteoarthritis,    Abdominal Normal abdominal exam  (+)   Peds  Hematology negative hematology ROS (+)   Anesthesia Other Findings HTN, chronic diastolic CHF (EF 11-94% by echo 2019), COPD (mild), HLD, mild nonobstructive CAD (30% prox RCA, 40% ostial RPDA, 45% D1, 45% dLAD), BPPV, Hypothyroid, CKD stage II, chronic dizziness, severe aortic stenosis s/p TAVR 2018 (valve functioning normally by echo 2019).   Reproductive/Obstetrics                           Anesthesia Physical Anesthesia Plan  ASA: III  Anesthesia Plan: General   Post-op Pain Management:  Regional for Post-op pain   Induction: Intravenous  PONV Risk Score and Plan: 3 and Ondansetron  Airway Management Planned: LMA  Additional Equipment:   Intra-op Plan:   Post-operative Plan: Extubation in OR  Informed Consent: I have reviewed the patients History and Physical, chart, labs and discussed the procedure including the risks, benefits and alternatives for the proposed anesthesia with the patient or authorized representative who has indicated his/her understanding and acceptance.     Dental advisory given  Plan Discussed with: CRNA  Anesthesia Plan Comments: (See PAT note by Karoline Caldwell, PA-C )      Anesthesia Quick Evaluation

## 2018-04-05 ENCOUNTER — Ambulatory Visit: Payer: Medicare Other | Admitting: Nurse Practitioner

## 2018-04-10 MED ORDER — TRANEXAMIC ACID-NACL 1000-0.7 MG/100ML-% IV SOLN
1000.0000 mg | INTRAVENOUS | Status: AC
  Administered 2018-04-11: 1000 mg via INTRAVENOUS
  Filled 2018-04-10: qty 100

## 2018-04-11 ENCOUNTER — Inpatient Hospital Stay (HOSPITAL_COMMUNITY)
Admission: RE | Admit: 2018-04-11 | Discharge: 2018-04-15 | DRG: 470 | Disposition: A | Discharge status: Skilled Nursing Facility | Payer: Medicare Other | Attending: Orthopaedic Surgery | Admitting: Orthopaedic Surgery

## 2018-04-11 ENCOUNTER — Other Ambulatory Visit: Payer: Self-pay

## 2018-04-11 ENCOUNTER — Ambulatory Visit (HOSPITAL_COMMUNITY): Payer: Medicare Other | Admitting: Registered Nurse

## 2018-04-11 ENCOUNTER — Encounter (HOSPITAL_COMMUNITY)
Admission: RE | Disposition: A | Discharge status: Skilled Nursing Facility | Payer: Self-pay | Source: Home / Self Care | Attending: Orthopaedic Surgery

## 2018-04-11 ENCOUNTER — Encounter (HOSPITAL_COMMUNITY): Payer: Self-pay

## 2018-04-11 ENCOUNTER — Ambulatory Visit (HOSPITAL_COMMUNITY): Payer: Medicare Other | Admitting: Physician Assistant

## 2018-04-11 ENCOUNTER — Ambulatory Visit (HOSPITAL_COMMUNITY): Payer: Medicare Other

## 2018-04-11 DIAGNOSIS — E785 Hyperlipidemia, unspecified: Secondary | ICD-10-CM | POA: Diagnosis present

## 2018-04-11 DIAGNOSIS — N182 Chronic kidney disease, stage 2 (mild): Secondary | ICD-10-CM | POA: Diagnosis not present

## 2018-04-11 DIAGNOSIS — Z801 Family history of malignant neoplasm of trachea, bronchus and lung: Secondary | ICD-10-CM | POA: Diagnosis not present

## 2018-04-11 DIAGNOSIS — Z87891 Personal history of nicotine dependence: Secondary | ICD-10-CM

## 2018-04-11 DIAGNOSIS — Z7982 Long term (current) use of aspirin: Secondary | ICD-10-CM | POA: Diagnosis not present

## 2018-04-11 DIAGNOSIS — Z953 Presence of xenogenic heart valve: Secondary | ICD-10-CM | POA: Diagnosis not present

## 2018-04-11 DIAGNOSIS — M5136 Other intervertebral disc degeneration, lumbar region: Secondary | ICD-10-CM | POA: Diagnosis present

## 2018-04-11 DIAGNOSIS — Z96652 Presence of left artificial knee joint: Secondary | ICD-10-CM

## 2018-04-11 DIAGNOSIS — G8918 Other acute postprocedural pain: Secondary | ICD-10-CM | POA: Diagnosis not present

## 2018-04-11 DIAGNOSIS — M8580 Other specified disorders of bone density and structure, unspecified site: Secondary | ICD-10-CM | POA: Diagnosis not present

## 2018-04-11 DIAGNOSIS — M1712 Unilateral primary osteoarthritis, left knee: Principal | ICD-10-CM | POA: Diagnosis present

## 2018-04-11 DIAGNOSIS — E669 Obesity, unspecified: Secondary | ICD-10-CM | POA: Diagnosis present

## 2018-04-11 DIAGNOSIS — H9190 Unspecified hearing loss, unspecified ear: Secondary | ICD-10-CM | POA: Diagnosis present

## 2018-04-11 DIAGNOSIS — I2510 Atherosclerotic heart disease of native coronary artery without angina pectoris: Secondary | ICD-10-CM | POA: Diagnosis not present

## 2018-04-11 DIAGNOSIS — Z471 Aftercare following joint replacement surgery: Secondary | ICD-10-CM | POA: Diagnosis not present

## 2018-04-11 DIAGNOSIS — G4733 Obstructive sleep apnea (adult) (pediatric): Secondary | ICD-10-CM | POA: Diagnosis not present

## 2018-04-11 DIAGNOSIS — Z8249 Family history of ischemic heart disease and other diseases of the circulatory system: Secondary | ICD-10-CM | POA: Diagnosis not present

## 2018-04-11 DIAGNOSIS — Z91048 Other nonmedicinal substance allergy status: Secondary | ICD-10-CM | POA: Diagnosis not present

## 2018-04-11 DIAGNOSIS — K219 Gastro-esophageal reflux disease without esophagitis: Secondary | ICD-10-CM | POA: Diagnosis present

## 2018-04-11 DIAGNOSIS — I6529 Occlusion and stenosis of unspecified carotid artery: Secondary | ICD-10-CM | POA: Diagnosis not present

## 2018-04-11 DIAGNOSIS — Z981 Arthrodesis status: Secondary | ICD-10-CM | POA: Diagnosis not present

## 2018-04-11 DIAGNOSIS — E039 Hypothyroidism, unspecified: Secondary | ICD-10-CM | POA: Diagnosis not present

## 2018-04-11 DIAGNOSIS — I959 Hypotension, unspecified: Secondary | ICD-10-CM | POA: Diagnosis not present

## 2018-04-11 DIAGNOSIS — I729 Aneurysm of unspecified site: Secondary | ICD-10-CM | POA: Diagnosis not present

## 2018-04-11 DIAGNOSIS — I739 Peripheral vascular disease, unspecified: Secondary | ICD-10-CM | POA: Diagnosis not present

## 2018-04-11 DIAGNOSIS — Z79890 Hormone replacement therapy (postmenopausal): Secondary | ICD-10-CM | POA: Diagnosis not present

## 2018-04-11 DIAGNOSIS — I129 Hypertensive chronic kidney disease with stage 1 through stage 4 chronic kidney disease, or unspecified chronic kidney disease: Secondary | ICD-10-CM | POA: Diagnosis not present

## 2018-04-11 DIAGNOSIS — E46 Unspecified protein-calorie malnutrition: Secondary | ICD-10-CM | POA: Diagnosis not present

## 2018-04-11 DIAGNOSIS — M545 Low back pain: Secondary | ICD-10-CM | POA: Diagnosis not present

## 2018-04-11 DIAGNOSIS — I10 Essential (primary) hypertension: Secondary | ICD-10-CM | POA: Diagnosis not present

## 2018-04-11 DIAGNOSIS — J449 Chronic obstructive pulmonary disease, unspecified: Secondary | ICD-10-CM | POA: Diagnosis present

## 2018-04-11 DIAGNOSIS — Z888 Allergy status to other drugs, medicaments and biological substances status: Secondary | ICD-10-CM | POA: Diagnosis not present

## 2018-04-11 DIAGNOSIS — Z6828 Body mass index (BMI) 28.0-28.9, adult: Secondary | ICD-10-CM

## 2018-04-11 DIAGNOSIS — N189 Chronic kidney disease, unspecified: Secondary | ICD-10-CM | POA: Diagnosis not present

## 2018-04-11 DIAGNOSIS — Z7401 Bed confinement status: Secondary | ICD-10-CM | POA: Diagnosis not present

## 2018-04-11 DIAGNOSIS — K589 Irritable bowel syndrome without diarrhea: Secondary | ICD-10-CM | POA: Diagnosis not present

## 2018-04-11 DIAGNOSIS — M2550 Pain in unspecified joint: Secondary | ICD-10-CM | POA: Diagnosis not present

## 2018-04-11 DIAGNOSIS — M25562 Pain in left knee: Secondary | ICD-10-CM | POA: Diagnosis present

## 2018-04-11 DIAGNOSIS — M47812 Spondylosis without myelopathy or radiculopathy, cervical region: Secondary | ICD-10-CM | POA: Diagnosis not present

## 2018-04-11 DIAGNOSIS — J441 Chronic obstructive pulmonary disease with (acute) exacerbation: Secondary | ICD-10-CM | POA: Diagnosis not present

## 2018-04-11 DIAGNOSIS — D649 Anemia, unspecified: Secondary | ICD-10-CM | POA: Diagnosis not present

## 2018-04-11 HISTORY — PX: TOTAL KNEE ARTHROPLASTY: SHX125

## 2018-04-11 SURGERY — ARTHROPLASTY, KNEE, TOTAL
Anesthesia: General | Site: Knee | Laterality: Left

## 2018-04-11 MED ORDER — DEXAMETHASONE SODIUM PHOSPHATE 10 MG/ML IJ SOLN
INTRAMUSCULAR | Status: DC | PRN
  Administered 2018-04-11: 10 mg via INTRAVENOUS

## 2018-04-11 MED ORDER — ALUM & MAG HYDROXIDE-SIMETH 200-200-20 MG/5ML PO SUSP: 30.0000 mL | ORAL | Status: DC | PRN

## 2018-04-11 MED ORDER — MEPERIDINE HCL 50 MG/ML IJ SOLN: 6.2500 mg | INTRAMUSCULAR | Status: DC | PRN

## 2018-04-11 MED ORDER — OXYCODONE HCL 5 MG PO TABS
10.0000 mg | ORAL_TABLET | ORAL | Status: DC | PRN
  Administered 2018-04-13: 10 mg via ORAL
  Filled 2018-04-11: qty 2

## 2018-04-11 MED ORDER — CEFAZOLIN SODIUM-DEXTROSE 2-4 GM/100ML-% IV SOLN
INTRAVENOUS | Status: AC
  Filled 2018-04-11: qty 100

## 2018-04-11 MED ORDER — DIPHENHYDRAMINE HCL 12.5 MG/5ML PO ELIX: 12.5000 mg | ORAL_SOLUTION | ORAL | Status: DC | PRN

## 2018-04-11 MED ORDER — ACETAMINOPHEN 325 MG PO TABS
325.0000 mg | ORAL_TABLET | Freq: Four times a day (QID) | ORAL | Status: DC | PRN
  Administered 2018-04-12 – 2018-04-15 (×3): 650 mg via ORAL
  Filled 2018-04-11 (×3): qty 2

## 2018-04-11 MED ORDER — METHOCARBAMOL 500 MG PO TABS
ORAL_TABLET | ORAL | Status: AC
  Filled 2018-04-11: qty 1

## 2018-04-11 MED ORDER — LIDOCAINE 2% (20 MG/ML) 5 ML SYRINGE
INTRAMUSCULAR | Status: AC
  Filled 2018-04-11: qty 5

## 2018-04-11 MED ORDER — METOCLOPRAMIDE HCL 5 MG/ML IJ SOLN: 5.0000 mg | Freq: Three times a day (TID) | INTRAMUSCULAR | Status: DC | PRN

## 2018-04-11 MED ORDER — TRANEXAMIC ACID-NACL 1000-0.7 MG/100ML-% IV SOLN
INTRAVENOUS | Status: AC
  Filled 2018-04-11: qty 100

## 2018-04-11 MED ORDER — OXYCODONE HCL 5 MG PO TABS
5.0000 mg | ORAL_TABLET | ORAL | Status: DC | PRN
  Administered 2018-04-11 – 2018-04-14 (×3): 5 mg via ORAL
  Filled 2018-04-11 (×2): qty 1

## 2018-04-11 MED ORDER — ROPIVACAINE HCL 5 MG/ML IJ SOLN: INTRAMUSCULAR | Status: DC | PRN

## 2018-04-11 MED ORDER — METHOCARBAMOL 500 MG PO TABS
500.0000 mg | ORAL_TABLET | Freq: Four times a day (QID) | ORAL | Status: DC | PRN
  Administered 2018-04-11 – 2018-04-12 (×4): 500 mg via ORAL
  Filled 2018-04-11 (×3): qty 1

## 2018-04-11 MED ORDER — LACTATED RINGERS IV SOLN
INTRAVENOUS | Status: DC | PRN
  Administered 2018-04-11 (×2): via INTRAVENOUS

## 2018-04-11 MED ORDER — FENTANYL CITRATE (PF) 250 MCG/5ML IJ SOLN
INTRAMUSCULAR | Status: AC
  Filled 2018-04-11: qty 5

## 2018-04-11 MED ORDER — VITAMIN B-12 1000 MCG PO TABS
1000.0000 ug | ORAL_TABLET | Freq: Every day | ORAL | Status: DC
  Administered 2018-04-12 – 2018-04-15 (×4): 1000 ug via ORAL
  Filled 2018-04-11 (×5): qty 1

## 2018-04-11 MED ORDER — PROMETHAZINE HCL 25 MG PO TABS: 12.5000 mg | ORAL_TABLET | Freq: Four times a day (QID) | ORAL | Status: DC | PRN

## 2018-04-11 MED ORDER — ROCURONIUM BROMIDE 10 MG/ML (PF) SYRINGE
PREFILLED_SYRINGE | INTRAVENOUS | Status: DC | PRN
  Administered 2018-04-11: 20 mg via INTRAVENOUS
  Administered 2018-04-11: 30 mg via INTRAVENOUS
  Administered 2018-04-11: 20 mg via INTRAVENOUS

## 2018-04-11 MED ORDER — ADULT MULTIVITAMIN W/MINERALS CH
1.0000 | ORAL_TABLET | Freq: Every day | ORAL | Status: DC
  Administered 2018-04-12 – 2018-04-15 (×4): 1 via ORAL
  Filled 2018-04-11 (×4): qty 1

## 2018-04-11 MED ORDER — OXYCODONE HCL 5 MG PO TABS
ORAL_TABLET | ORAL | Status: AC
  Filled 2018-04-11: qty 1

## 2018-04-11 MED ORDER — ONDANSETRON HCL 4 MG/2ML IJ SOLN: 4.0000 mg | Freq: Four times a day (QID) | INTRAMUSCULAR | Status: DC | PRN

## 2018-04-11 MED ORDER — TIOTROPIUM BROMIDE MONOHYDRATE 18 MCG IN CAPS: 18.0000 ug | ORAL_CAPSULE | Freq: Every day | RESPIRATORY_TRACT | Status: DC

## 2018-04-11 MED ORDER — DOCUSATE SODIUM 100 MG PO CAPS
100.0000 mg | ORAL_CAPSULE | Freq: Two times a day (BID) | ORAL | Status: DC
  Administered 2018-04-11 – 2018-04-15 (×8): 100 mg via ORAL
  Filled 2018-04-11 (×8): qty 1

## 2018-04-11 MED ORDER — PHENOL 1.4 % MT LIQD: 1.0000 | OROMUCOSAL | Status: DC | PRN

## 2018-04-11 MED ORDER — ROPIVACAINE HCL 5 MG/ML IJ SOLN
INTRAMUSCULAR | Status: DC | PRN
  Administered 2018-04-11 (×2): 5 mL via PERINEURAL

## 2018-04-11 MED ORDER — METHOCARBAMOL 1000 MG/10ML IJ SOLN
500.0000 mg | Freq: Four times a day (QID) | INTRAVENOUS | Status: DC | PRN
  Filled 2018-04-11: qty 5

## 2018-04-11 MED ORDER — PHENYLEPHRINE 40 MCG/ML (10ML) SYRINGE FOR IV PUSH (FOR BLOOD PRESSURE SUPPORT)
PREFILLED_SYRINGE | INTRAVENOUS | Status: DC | PRN
  Administered 2018-04-11: 40 ug via INTRAVENOUS
  Administered 2018-04-11: 80 ug via INTRAVENOUS

## 2018-04-11 MED ORDER — LIDOCAINE 2% (20 MG/ML) 5 ML SYRINGE
INTRAMUSCULAR | Status: DC | PRN
  Administered 2018-04-11: 60 mg via INTRAVENOUS

## 2018-04-11 MED ORDER — ROPIVACAINE HCL 7.5 MG/ML IJ SOLN
INTRAMUSCULAR | Status: DC | PRN
  Administered 2018-04-11 (×4): 5 mL via PERINEURAL

## 2018-04-11 MED ORDER — LACTATED RINGERS IV SOLN
INTRAVENOUS | Status: DC
  Administered 2018-04-11: 13:00:00 via INTRAVENOUS

## 2018-04-11 MED ORDER — SODIUM CHLORIDE 0.9 % IV SOLN
INTRAVENOUS | Status: DC
  Administered 2018-04-11: 19:00:00 via INTRAVENOUS

## 2018-04-11 MED ORDER — ASPIRIN 81 MG PO CHEW
81.0000 mg | CHEWABLE_TABLET | Freq: Two times a day (BID) | ORAL | Status: DC
  Administered 2018-04-11 – 2018-04-15 (×8): 81 mg via ORAL
  Filled 2018-04-11 (×9): qty 1

## 2018-04-11 MED ORDER — KETOROLAC TROMETHAMINE 15 MG/ML IJ SOLN
INTRAMUSCULAR | Status: AC
  Filled 2018-04-11: qty 1

## 2018-04-11 MED ORDER — PANTOPRAZOLE SODIUM 40 MG PO TBEC
40.0000 mg | DELAYED_RELEASE_TABLET | Freq: Every day | ORAL | Status: DC
  Administered 2018-04-11 – 2018-04-15 (×4): 40 mg via ORAL
  Filled 2018-04-11 (×5): qty 1

## 2018-04-11 MED ORDER — PROMETHAZINE HCL 25 MG/ML IJ SOLN: 6.2500 mg | INTRAMUSCULAR | Status: DC | PRN

## 2018-04-11 MED ORDER — ROCURONIUM BROMIDE 50 MG/5ML IV SOSY
PREFILLED_SYRINGE | INTRAVENOUS | Status: AC
  Filled 2018-04-11: qty 25

## 2018-04-11 MED ORDER — UMECLIDINIUM BROMIDE 62.5 MCG/INH IN AEPB
1.0000 | INHALATION_SPRAY | Freq: Every day | RESPIRATORY_TRACT | Status: DC
  Administered 2018-04-12 – 2018-04-15 (×4): 1 via RESPIRATORY_TRACT
  Filled 2018-04-11: qty 7

## 2018-04-11 MED ORDER — PROPOFOL 1000 MG/100ML IV EMUL
INTRAVENOUS | Status: AC
  Filled 2018-04-11: qty 200

## 2018-04-11 MED ORDER — NAPHAZOLINE-GLYCERIN 0.012-0.2 % OP SOLN
1.0000 [drp] | Freq: Four times a day (QID) | OPHTHALMIC | Status: DC | PRN
  Filled 2018-04-11: qty 15

## 2018-04-11 MED ORDER — PROPOFOL 10 MG/ML IV BOLUS
INTRAVENOUS | Status: AC
  Filled 2018-04-11: qty 20

## 2018-04-11 MED ORDER — 0.9 % SODIUM CHLORIDE (POUR BTL) OPTIME
TOPICAL | Status: DC | PRN
  Administered 2018-04-11: 1000 mL

## 2018-04-11 MED ORDER — MECLIZINE HCL 12.5 MG PO TABS: 12.5000 mg | ORAL_TABLET | Freq: Three times a day (TID) | ORAL | Status: DC | PRN

## 2018-04-11 MED ORDER — VITAMIN C 500 MG PO TABS
500.0000 mg | ORAL_TABLET | Freq: Every day | ORAL | Status: DC
  Administered 2018-04-12 – 2018-04-15 (×4): 500 mg via ORAL
  Filled 2018-04-11 (×5): qty 1

## 2018-04-11 MED ORDER — PROPOFOL 10 MG/ML IV BOLUS
INTRAVENOUS | Status: DC | PRN
  Administered 2018-04-11: 150 mg via INTRAVENOUS

## 2018-04-11 MED ORDER — MENTHOL 3 MG MT LOZG: 1.0000 | LOZENGE | OROMUCOSAL | Status: DC | PRN

## 2018-04-11 MED ORDER — CEFAZOLIN SODIUM-DEXTROSE 1-4 GM/50ML-% IV SOLN
1.0000 g | Freq: Four times a day (QID) | INTRAVENOUS | Status: AC
  Administered 2018-04-11 – 2018-04-12 (×2): 1 g via INTRAVENOUS
  Filled 2018-04-11 (×3): qty 50

## 2018-04-11 MED ORDER — ONDANSETRON HCL 4 MG/2ML IJ SOLN
INTRAMUSCULAR | Status: DC | PRN
  Administered 2018-04-11: 4 mg via INTRAVENOUS

## 2018-04-11 MED ORDER — FENTANYL CITRATE (PF) 100 MCG/2ML IJ SOLN
INTRAMUSCULAR | Status: AC
  Administered 2018-04-11: 100 ug via INTRAVENOUS
  Filled 2018-04-11: qty 2

## 2018-04-11 MED ORDER — HYDROMORPHONE HCL 1 MG/ML IJ SOLN
0.2500 mg | INTRAMUSCULAR | Status: DC | PRN
  Administered 2018-04-11 (×2): 0.5 mg via INTRAVENOUS

## 2018-04-11 MED ORDER — VITAMIN E 180 MG (400 UNIT) PO CAPS
400.0000 [IU] | ORAL_CAPSULE | Freq: Two times a day (BID) | ORAL | Status: DC
  Administered 2018-04-12 – 2018-04-15 (×6): 400 [IU] via ORAL
  Filled 2018-04-11 (×9): qty 1

## 2018-04-11 MED ORDER — METOCLOPRAMIDE HCL 5 MG PO TABS: 5.0000 mg | ORAL_TABLET | Freq: Three times a day (TID) | ORAL | Status: DC | PRN

## 2018-04-11 MED ORDER — POLYETHYLENE GLYCOL 3350 17 G PO PACK: 17.0000 g | PACK | Freq: Every day | ORAL | Status: DC | PRN

## 2018-04-11 MED ORDER — LABETALOL HCL 5 MG/ML IV SOLN
INTRAVENOUS | Status: DC | PRN
  Administered 2018-04-11 (×4): 2.5 mg via INTRAVENOUS

## 2018-04-11 MED ORDER — VITAMIN D 25 MCG (1000 UNIT) PO TABS
6000.0000 [IU] | ORAL_TABLET | Freq: Every day | ORAL | Status: DC
  Administered 2018-04-12 – 2018-04-15 (×4): 6000 [IU] via ORAL
  Filled 2018-04-11 (×5): qty 6

## 2018-04-11 MED ORDER — SUGAMMADEX SODIUM 200 MG/2ML IV SOLN
INTRAVENOUS | Status: DC | PRN
  Administered 2018-04-11: 200 mg via INTRAVENOUS

## 2018-04-11 MED ORDER — LEVOTHYROXINE SODIUM 25 MCG PO TABS
137.0000 ug | ORAL_TABLET | Freq: Every day | ORAL | Status: DC
  Administered 2018-04-12 – 2018-04-15 (×4): 137 ug via ORAL
  Filled 2018-04-11 (×4): qty 1

## 2018-04-11 MED ORDER — KETOROLAC TROMETHAMINE 15 MG/ML IJ SOLN
15.0000 mg | Freq: Once | INTRAMUSCULAR | Status: AC
  Administered 2018-04-11: 15 mg via INTRAVENOUS

## 2018-04-11 MED ORDER — HYDROMORPHONE HCL 1 MG/ML IJ SOLN
INTRAMUSCULAR | Status: AC
  Filled 2018-04-11: qty 1

## 2018-04-11 MED ORDER — CEFAZOLIN SODIUM-DEXTROSE 2-4 GM/100ML-% IV SOLN
2.0000 g | INTRAVENOUS | Status: AC
  Administered 2018-04-11: 2 g via INTRAVENOUS

## 2018-04-11 MED ORDER — ONDANSETRON HCL 4 MG PO TABS: 4.0000 mg | ORAL_TABLET | Freq: Four times a day (QID) | ORAL | Status: DC | PRN

## 2018-04-11 MED ORDER — MIDAZOLAM HCL 2 MG/2ML IJ SOLN
INTRAMUSCULAR | Status: AC
  Filled 2018-04-11: qty 2

## 2018-04-11 MED ORDER — AMLODIPINE BESYLATE 2.5 MG PO TABS
2.5000 mg | ORAL_TABLET | Freq: Every day | ORAL | Status: DC
  Administered 2018-04-12 – 2018-04-15 (×4): 2.5 mg via ORAL
  Filled 2018-04-11 (×4): qty 1

## 2018-04-11 MED ORDER — BISACODYL 10 MG RE SUPP: 10.0000 mg | Freq: Every day | RECTAL | Status: DC | PRN

## 2018-04-11 MED ORDER — FENTANYL CITRATE (PF) 250 MCG/5ML IJ SOLN
INTRAMUSCULAR | Status: DC | PRN
  Administered 2018-04-11 (×2): 50 ug via INTRAVENOUS
  Administered 2018-04-11: 75 ug via INTRAVENOUS
  Administered 2018-04-11: 25 ug via INTRAVENOUS

## 2018-04-11 MED ORDER — FENTANYL CITRATE (PF) 100 MCG/2ML IJ SOLN
100.0000 ug | Freq: Once | INTRAMUSCULAR | Status: AC
  Administered 2018-04-11: 100 ug via INTRAVENOUS

## 2018-04-11 MED ORDER — SODIUM CHLORIDE 0.9 % IR SOLN
Status: DC | PRN
  Administered 2018-04-11: 3000 mL

## 2018-04-11 MED ORDER — HYDROMORPHONE HCL 1 MG/ML IJ SOLN
0.5000 mg | INTRAMUSCULAR | Status: DC | PRN
  Administered 2018-04-12 – 2018-04-13 (×2): 1 mg via INTRAVENOUS
  Filled 2018-04-11 (×2): qty 1

## 2018-04-11 MED ORDER — CHLORHEXIDINE GLUCONATE 4 % EX LIQD: 60.0000 mL | Freq: Once | CUTANEOUS | Status: DC

## 2018-04-11 SURGICAL SUPPLY — 70 items
BANDAGE ACE 6X5 VEL STRL LF (GAUZE/BANDAGES/DRESSINGS) ×4 IMPLANT
BANDAGE ELASTIC 6 VELCRO ST LF (GAUZE/BANDAGES/DRESSINGS) ×2 IMPLANT
BANDAGE ESMARK 6X9 LF (GAUZE/BANDAGES/DRESSINGS) ×1 IMPLANT
BASEPLATE TIBIAL TRIATHALON 3 (Plate) ×1 IMPLANT
BLADE SAG 18X100X1.27 (BLADE) ×2 IMPLANT
BNDG CMPR 9X6 STRL LF SNTH (GAUZE/BANDAGES/DRESSINGS) ×1
BNDG ESMARK 6X9 LF (GAUZE/BANDAGES/DRESSINGS) ×2
BOWL SMART MIX CTS (DISPOSABLE) ×2 IMPLANT
BSPLAT TIB 3 CMNT PRM STRL KN (Plate) ×1 IMPLANT
CEMENT BONE SIMPLEX SPEEDSET (Cement) ×2 IMPLANT
COVER SURGICAL LIGHT HANDLE (MISCELLANEOUS) ×2 IMPLANT
COVER WAND RF STERILE (DRAPES) ×2 IMPLANT
CUFF TOURNIQUET SINGLE 34IN LL (TOURNIQUET CUFF) ×2 IMPLANT
CUFF TOURNIQUET SINGLE 44IN (TOURNIQUET CUFF) IMPLANT
DRAPE EXTREMITY T 121X128X90 (DISPOSABLE) ×2 IMPLANT
DRAPE HALF SHEET 40X57 (DRAPES) ×2 IMPLANT
DRAPE U-SHAPE 47X51 STRL (DRAPES) ×2 IMPLANT
DRSG PAD ABDOMINAL 8X10 ST (GAUZE/BANDAGES/DRESSINGS) ×2 IMPLANT
DURAPREP 26ML APPLICATOR (WOUND CARE) ×2 IMPLANT
ELECT CAUTERY BLADE 6.4 (BLADE) ×2 IMPLANT
ELECT REM PT RETURN 9FT ADLT (ELECTROSURGICAL) ×2
ELECTRODE REM PT RTRN 9FT ADLT (ELECTROSURGICAL) ×1 IMPLANT
FACESHIELD WRAPAROUND (MASK) ×4 IMPLANT
FACESHIELD WRAPAROUND OR TEAM (MASK) ×2 IMPLANT
FEMORAL PEG DISTAL FIXATION (Orthopedic Implant) ×1 IMPLANT
FEMORAL POSTERIOR STAB SZ4 (Orthopedic Implant) ×2 IMPLANT
GAUZE SPONGE 4X4 12PLY STRL (GAUZE/BANDAGES/DRESSINGS) ×2 IMPLANT
GAUZE SPONGE 4X4 12PLY STRL LF (GAUZE/BANDAGES/DRESSINGS) ×2 IMPLANT
GAUZE XEROFORM 1X8 LF (GAUZE/BANDAGES/DRESSINGS) ×2 IMPLANT
GAUZE XEROFORM 5X9 LF (GAUZE/BANDAGES/DRESSINGS) ×1 IMPLANT
GLOVE BIOGEL PI IND STRL 8 (GLOVE) ×2 IMPLANT
GLOVE BIOGEL PI INDICATOR 8 (GLOVE) ×2
GLOVE ORTHO TXT STRL SZ7.5 (GLOVE) ×2 IMPLANT
GLOVE SURG ORTHO 8.0 STRL STRW (GLOVE) ×2 IMPLANT
GOWN STRL REUS W/ TWL LRG LVL3 (GOWN DISPOSABLE) IMPLANT
GOWN STRL REUS W/ TWL XL LVL3 (GOWN DISPOSABLE) ×2 IMPLANT
GOWN STRL REUS W/TWL LRG LVL3 (GOWN DISPOSABLE)
GOWN STRL REUS W/TWL XL LVL3 (GOWN DISPOSABLE) ×4
HANDPIECE INTERPULSE COAX TIP (DISPOSABLE) ×2
IMMOBILIZER KNEE 22 UNIV (SOFTGOODS) ×2 IMPLANT
INSERT TIBIA BEARING PS 3 9MM (Insert) ×1 IMPLANT
KIT BASIN OR (CUSTOM PROCEDURE TRAY) ×2 IMPLANT
KIT TURNOVER KIT B (KITS) ×2 IMPLANT
MANIFOLD NEPTUNE II (INSTRUMENTS) ×2 IMPLANT
NDL SAFETY ECLIPSE 18X1.5 (NEEDLE) IMPLANT
NEEDLE HYPO 18GX1.5 SHARP (NEEDLE)
NS IRRIG 1000ML POUR BTL (IV SOLUTION) ×2 IMPLANT
PACK TOTAL JOINT (CUSTOM PROCEDURE TRAY) ×2 IMPLANT
PAD ABD 8X10 STRL (GAUZE/BANDAGES/DRESSINGS) ×1 IMPLANT
PAD ARMBOARD 7.5X6 YLW CONV (MISCELLANEOUS) ×2 IMPLANT
PADDING CAST COTTON 6X4 STRL (CAST SUPPLIES) ×2 IMPLANT
PATELLA TRIATHLON SZ 29 9 MM (Orthopedic Implant) ×1 IMPLANT
SET HNDPC FAN SPRY TIP SCT (DISPOSABLE) ×1 IMPLANT
SET PAD KNEE POSITIONER (MISCELLANEOUS) ×2 IMPLANT
STAPLER VISISTAT 35W (STAPLE) ×1 IMPLANT
STRIP CLOSURE SKIN 1/2X4 (GAUZE/BANDAGES/DRESSINGS) ×2 IMPLANT
SUCTION FRAZIER HANDLE 10FR (MISCELLANEOUS) ×1
SUCTION TUBE FRAZIER 10FR DISP (MISCELLANEOUS) ×1 IMPLANT
SUT MNCRL AB 4-0 PS2 18 (SUTURE) IMPLANT
SUT VIC AB 0 CT1 27 (SUTURE) ×2
SUT VIC AB 0 CT1 27XBRD ANBCTR (SUTURE) ×1 IMPLANT
SUT VIC AB 1 CT1 27 (SUTURE) ×4
SUT VIC AB 1 CT1 27XBRD ANBCTR (SUTURE) ×2 IMPLANT
SUT VIC AB 2-0 CT1 27 (SUTURE) ×4
SUT VIC AB 2-0 CT1 TAPERPNT 27 (SUTURE) ×2 IMPLANT
SYR 50ML LL SCALE MARK (SYRINGE) IMPLANT
TOWEL OR 17X24 6PK STRL BLUE (TOWEL DISPOSABLE) ×2 IMPLANT
TOWEL OR 17X26 10 PK STRL BLUE (TOWEL DISPOSABLE) ×2 IMPLANT
TRAY CATH 16FR W/PLASTIC CATH (SET/KITS/TRAYS/PACK) IMPLANT
WRAP KNEE MAXI GEL POST OP (GAUZE/BANDAGES/DRESSINGS) ×2 IMPLANT

## 2018-04-11 NOTE — Anesthesia Postprocedure Evaluation (Signed)
Anesthesia Post Note  Patient: Becky Mccall  Procedure(s) Performed: LEFT TOTAL KNEE ARTHROPLASTY (Left Knee)     Patient location during evaluation: PACU Anesthesia Type: General Level of consciousness: awake and sedated Pain management: pain level controlled Vital Signs Assessment: post-procedure vital signs reviewed and stable Respiratory status: spontaneous breathing Cardiovascular status: stable Postop Assessment: no apparent nausea or vomiting Anesthetic complications: no    Last Vitals:  Vitals:   04/11/18 1710 04/11/18 1715  BP: (!) 170/82   Pulse:  82  Resp:  15  Temp:    SpO2:  95%    Last Pain:  Vitals:   04/11/18 1715  TempSrc:   PainSc: 4    Pain Goal:                   Huston Foley

## 2018-04-11 NOTE — Progress Notes (Signed)
1800 Received pt from PACU. A&O x3. LLE with ace wrap dry and intact, ice pack and immobilizer in place. Denies pain at this time.

## 2018-04-11 NOTE — Op Note (Signed)
NAME: Becky Mccall, Becky Mccall MEDICAL RECORD LK:[id number] ACCOUNT [id number] DATE OF BIRTH:1929-06-09 FACILITY: [facility] LOCATION: [facility] PHYSICIAN:Dujuan Stankowski Kerry Fort, MD  OPERATIVE REPORT  DATE OF PROCEDURE:  04/11/2018  PREOPERATIVE DIAGNOSIS:  Primary osteoarthritis and degenerative joint disease, left knee.  POSTOPERATIVE DIAGNOSIS:  Primary osteoarthritis and degenerative joint disease, left knee.  PROCEDURE:  Left total knee arthroplasty.  IMPLANTS:  Stryker Triathlon cemented knee system with size 4 femur, size 3 tibial tray, 9 mm thickness fixed bearing polyethylene insert, size 29 patellar button.  SURGEON:  Lind Guest. Ninfa Linden, MD  ASSISTANT:  Erskine Emery, PA-C  ANESTHESIA: 1.  Left lower extremity adductor canal block. 2.  General.  TOURNIQUET TIME:  Less than 1 hour.  ESTIMATED BLOOD LOSS:  Less than 100 mL.  ANTIBIOTICS:  Two grams IV Ancef.  COMPLICATIONS:  None.  INDICATIONS:  The patient is an 85 year old female well known to me.  I followed her for a long period of time due to the debilitating arthritis of her left knee.  She has tried and failed all forms of conservative treatment.  At this point, given the  detrimental affect her knee pain has had on her daily life including her activities of daily living, her quality of life and her mobility, she does wish to proceed with total knee arthroplasty.  I have talked to her and her daughter in detail about this.   They understand she is 85 years old and has a heightened risk of detrimental things happening during surgery.  The risks include acute blood loss anemia, nerve or vessel injury, fracture, infection, implant failure and DVT.  She understands our goals  are to decrease pain, improve mobility and overall improve quality of life.  DESCRIPTION OF PROCEDURE:  After informed consent was obtained and appropriate left knee was marked.  She was brought to the operating room after an adductor canal block  was obtained in the holding room of her left lower extremity.  In the operating  room, general anesthesia was obtained.  She was placed supine on the operating table.  Nonsterile tourniquet was placed around the upper left thigh and her left thigh, knee, leg, ankle and foot were prepped and draped with ChloraPrep and sterile drapes.   A time-out was called and she was identified by correct patient and correct left knee.  We then used an Esmarch to wrap that leg and tourniquet was inflated to 300 mm of pressure.  I then made a direct midline incision over the patella and carried this  proximally and distally.  We dissected down the knee joint and carried out a medial parapatellar arthrotomy finding moderate joint effusion and finding severe cartilage wear in the lateral aspect of her knee on the lateral femoral condyle and lateral  tibial plateau with wide areas of full thickness cartilage loss.  The medial compartment did not look bad, but the patellofemoral joint had significant cartilage changes.  With the knee in a flexed position, we removed remnants of ACL, PCL, medial and  lateral meniscus.  We put our extramedullary cutting guide for the tibia based off the tibial tubercle and the tibial crest correcting for varus and valgus and neutral slope setting our cutting guide for 9 mm off the high side.  We made this cut without  difficulty.  We then went to the femur and used an intramedullary guide for the femur through the notch of the femur.  We set this for a distal femoral cut at a 10 mm distal femoral  cut due to a slight flexion contracture.  We put our cutting guide for a  left knee at 5 degrees externally rotated.  We made this cut without difficulty.  Of note, we did 5 degrees externally rotated because she really had neutral alignment in spite of lateral compartment osteoarthritis.  With the knee in full extended  position, we put a 9 mm extension block and we achieved full extension.  We then went  back to the femur and put our femoral sizing guide based off the epicondylar axis and Whiteside line.  We chose a size 4 femur based off of this.  We placed a size 4  femur cutting block and made our anterior and posterior cuts followed by our chamfer cuts.  We then made our femoral box cut.  Attention was then turned back to the tibia.  I chose a size 3 tibia tray for coverage of the tibia and set the rotation of the  tibial tubercle and the femur.  We made our keel punch off of this.  With a trial  size 3 tibia followed by the trial size 4 left femur, we trialed an 11 mm fixed bearing polyethylene trial insert.  We were pleased with stability and range of motion  with that insert.  We then made our patellar cut and drilled 3 holes for a size 29 trial patellar button.  We were again pleased with range of motion and stability with all implants in place.  We then removed the trial implants and irrigated the knee  thoroughly.  We mixed our cement and cemented the real Stryker Triathlon tibial tray size 3 followed by the real size 4 left femur.  We cleaned cement debris from the knee and placed our real 9 mm fixed bearing polyethylene insert and cemented our  patellar button.  We held the knee in extended position while the cement hardened.  Once it hardened, we put the knee through range of motion and again we were pleased with stability of the knee.  We then let the tourniquet down.  Hemostasis obtained  with electrocautery.  We closed the arthrotomy with interrupted #1 Vicryl suture followed by 0 Vicryl in the deep tissue, 2-0 Vicryl subcutaneous tissue and interrupted staples on the skin.  Xeroform and well-padded sterile dressing was applied.  She was  awakened, extubated, and taken to recovery room in stable condition.  All final counts were correct.  No complications noted.  Of note, Benita Stabile, PA-C, assisted the entire case.  His assistance was crucial for facilitating all aspects of this  case.  TN/NUANCE  D:04/11/2018 T:04/11/2018 JOB:[id number]

## 2018-04-11 NOTE — Anesthesia Procedure Notes (Addendum)
Procedure Name: Intubation Date/Time: 04/11/2018 2:15 PM Performed by: Jearld Pies, CRNA Pre-anesthesia Checklist: Patient identified, Emergency Drugs available, Suction available and Patient being monitored Patient Re-evaluated:Patient Re-evaluated prior to induction Oxygen Delivery Method: Circle System Utilized Preoxygenation: Pre-oxygenation with 100% oxygen Induction Type: IV induction Ventilation: Mask ventilation without difficulty and Oral airway inserted - appropriate to patient size Laryngoscope Size: Glidescope and 3 Grade View: Grade I Tube type: Oral Tube size: 7.0 mm Number of attempts: 1 Airway Equipment and Method: Stylet and Oral airway Placement Confirmation: ETT inserted through vocal cords under direct vision,  positive ETCO2 and breath sounds checked- equal and bilateral Secured at: 20 cm Tube secured with: Tape Dental Injury: Teeth and Oropharynx as per pre-operative assessment  Comments: Glidescope utilized d/t pre-operative complaints of neck pain and arthritis; patient reports comfort before induction in position; neck maintained in midline with intubation and stabilized.

## 2018-04-11 NOTE — Anesthesia Procedure Notes (Deleted)
Performed by: Nemiah Kissner D, CRNA       

## 2018-04-11 NOTE — Anesthesia Procedure Notes (Signed)
Anesthesia Regional Block: Adductor canal block   Pre-Anesthetic Checklist: ,, timeout performed, Correct Patient, Correct Site, Correct Laterality, Correct Procedure, Correct Position, site marked, Risks and benefits discussed,  Surgical consent,  Pre-op evaluation,  At surgeon's request and post-op pain management  Laterality: Lower and Left  Prep: chloraprep       Needles:  Injection technique: Single-shot  Needle Type: Echogenic Stimulator Needle     Needle Length: 10cm  Needle Gauge: 21   Needle insertion depth: 2.5 cm   Additional Needles:   Procedures:,,,, ultrasound used (permanent image in chart),,,,  Narrative:  Start time: 04/11/2018 1:20 PM End time: 04/11/2018 1:30 PM Injection made incrementally with aspirations every 5 mL.  Performed by: Personally  Anesthesiologist: Lyn Hollingshead, MD

## 2018-04-11 NOTE — Progress Notes (Signed)
Dr Kalman Shan aware SBP 176-180's/ 60's--no new orders.

## 2018-04-11 NOTE — Brief Op Note (Signed)
04/11/2018  3:41 PM  PATIENT:  Nilsa Nutting  85 y.o. female  PRE-OPERATIVE DIAGNOSIS:  left knee osteoarthritis  POST-OPERATIVE DIAGNOSIS:  Left Knee Osteoarthritis  PROCEDURE:  Procedure(s): LEFT TOTAL KNEE ARTHROPLASTY (Left)  SURGEON:  Surgeon(s) and Role:    Mcarthur Rossetti, MD - Primary  PHYSICIAN ASSISTANT: Benita Stabile, PA-C assisted  ANESTHESIA:   regional and general  EBL:  175 mL   TOURNIQUET:   Total Tourniquet Time Documented: Thigh (Left) - 52 minutes Total: Thigh (Left) - 52 minutes   DICTATION: .Other Dictation: Dictation Number [id number]  PLAN OF CARE: Admit to inpatient   PATIENT DISPOSITION:  PACU - hemodynamically stable.   Delay start of Pharmacological VTE agent (>24hrs) due to surgical blood loss or risk of bleeding: no

## 2018-04-11 NOTE — Transfer of Care (Signed)
Immediate Anesthesia Transfer of Care Note  Patient: Becky Mccall  Procedure(s) Performed: LEFT TOTAL KNEE ARTHROPLASTY (Left Knee)  Patient Location: PACU  Anesthesia Type:General  Level of Consciousness: awake, alert  and oriented  Airway & Oxygen Therapy: Patient Spontanous Breathing and Patient connected to face mask oxygen  Post-op Assessment: Report given to RN and Post -op Vital signs reviewed and stable  Post vital signs: Reviewed and stable  Last Vitals:  Vitals Value Taken Time  BP 160/83 04/11/2018  4:06 PM  Temp    Pulse 41 04/11/2018  4:11 PM  Resp 15 04/11/2018  4:11 PM  SpO2 95 % 04/11/2018  4:11 PM  Vitals shown include unvalidated device data.  Last Pain:  Vitals:   04/11/18 1217  TempSrc:   PainSc: 0-No pain         Complications: No apparent anesthesia complications

## 2018-04-11 NOTE — H&P (Signed)
TOTAL KNEE ADMISSION H&P  Patient is being admitted for left total knee arthroplasty.  Subjective:  Chief Complaint:left knee pain.  HPI: Becky Mccall, 85 y.o. female, has a history of pain and functional disability in the left knee due to arthritis and has failed non-surgical conservative treatments for greater than 12 weeks to includeNSAID's and/or analgesics, corticosteriod injections, viscosupplementation injections, flexibility and strengthening excercises, use of assistive devices and activity modification.  Onset of symptoms was gradual, starting 5 years ago with rapidlly worsening course since that time. The patient noted no past surgery on the left knee(s).  Patient currently rates pain in the left knee(s) at 10 out of 10 with activity. Patient has night pain, worsening of pain with activity and weight bearing, pain that interferes with activities of daily living, pain with passive range of motion, crepitus and joint swelling.  Patient has evidence of subchondral sclerosis, periarticular osteophytes and joint space narrowing by imaging studies. There is no active infection.  Patient Active Problem List   Diagnosis Date Noted  . CAD (coronary artery disease), native coronary artery 01/24/2018  . PVC's (premature ventricular contractions) 10/11/2017  . Leg cramps 10/11/2017  . Essential hypertension 10/10/2017  . Chronic pain of right knee 03/17/2017  . Unilateral primary osteoarthritis, left knee 03/17/2017  . Unilateral primary osteoarthritis, right knee 03/17/2017  . Chronic pain of left knee 02/07/2017  . S/P TAVR (transcatheter aortic valve replacement) 12/07/2016  . Severe aortic stenosis 12/07/2016  . Fibrocystic breast disease   . IBS (irritable bowel syndrome)   . Cervical spondylolysis   . Osteopenia   . Vitamin D deficiency   . DDD (degenerative disc disease), lumbar   . Squamous cell skin cancer   . Carotid stenosis   . OSA (obstructive sleep apnea)   . Colon  polyp   . Vertigo 05/04/2013  . Hard of hearing   . Hyperlipidemia   . COPD (chronic obstructive pulmonary disease) ([place])   . Hypothyroidism   . GERD (gastroesophageal reflux disease)   . Spinal stenosis in cervical region 02/19/2013   Past Medical History:  Diagnosis Date  . Brain aneurysm   . CAD in native artery    a.  [facility] 11/15/16 showing normal LVEDP, mild nonobstructive CAD (30% prox RCA, 40% ostial RPDA, 45% D1, 45% dLAD), severe AS with mean gradient 40.13mmHg, AVA 0.76cm2.   . Carotid stenosis    a. 1-39% by duplex 2018.  Marland Kitchen Cervical spondylolysis   . Chronic lower back pain   . CKD (chronic kidney disease), stage II   . COPD (chronic obstructive pulmonary disease) ([place])    "mild" (12/09/2016)  . DDD (degenerative disc disease), lumbar   . GERD (gastroesophageal reflux disease)   . Hard of hearing    wears hearing aides both ears  . Hyperlipidemia    can't take the meds so takes Fish Oil  . Hypertension   . Hypothyroidism   . IBS (irritable bowel syndrome)   . Intermittent dysphagia    for pills and solids  . Osteopenia   . S/P TAVR (transcatheter aortic valve replacement) 12/07/2016   26 mm Edwards Sapien 3 transcatheter heart valve placed via open left transcarotid approach   . Severe aortic stenosis    a. s/p TAVR 11/2016.  Marland Kitchen Vertigo     Past Surgical History:  Procedure Laterality Date  . ABDOMINAL HYSTERECTOMY    . APPENDECTOMY    . BACK SURGERY  2009   "on her lower back; not  sure what they did" (12/09/2016)  . BLEPHAROPLASTY Bilateral   . CARDIAC CATHETERIZATION    . CARDIAC VALVE REPLACEMENT  12/07/2016   Edwards Sapien 3 THV (size 26 mm, model # 9600TFX, serial # [id number]  . CATARACT EXTRACTION W/ INTRAOCULAR LENS  IMPLANT, BILATERAL  1999,2000  . CATARACT EXTRACTION, BILATERAL Bilateral   . COLONOSCOPY W/ BIOPSIES AND POLYPECTOMY  11/2003   Archie Endo 07/08/2010  . EYE SURGERY    . HEMORROIDECTOMY    . LUMBAR LAMINECTOMY  1999  . LUMBAR  LAMINECTOMY/DECOMPRESSION MICRODISCECTOMY  09/1999   Archie Endo 07/08/2010   . POSTERIOR FUSION LUMBAR SPINE  2014  . RIGHT/LEFT HEART CATH AND CORONARY ANGIOGRAPHY N/A 11/15/2016   Procedure: RIGHT/LEFT HEART CATH AND CORONARY ANGIOGRAPHY;  Surgeon: [name], MD;  Location: [facility];  Service: Cardiovascular;  Laterality: N/A;  . SKIN GRAFT SPLIT THICKNESS LEG / FOOT Left 09/2000   Archie Endo 07/08/2010  . TEE WITHOUT CARDIOVERSION N/A 12/07/2016   Procedure: TRANSESOPHAGEAL ECHOCARDIOGRAM (TEE);  Surgeon: [name], MD;  Location: [place];  Service: Open Heart Surgery;  Laterality: N/A;  . TONSILLECTOMY    . TRANSCATHETER AORTIC VALVE REPLACEMENT, TRANSFEMORAL  12/07/2016   Edwards Sapien 3 THV (size 26 mm, model # 9600TFX, serial # [id number]    Current Facility-Administered Medications  Medication Dose Route Frequency Provider Last Rate Last Dose  . ceFAZolin (ANCEF) 2-4 GM/100ML-% IVPB           . ceFAZolin (ANCEF) IVPB 2g/100 mL premix  2 g Intravenous On Call to OR Pete Pelt, PA-C      . chlorhexidine (HIBICLENS) 4 % liquid 4 application  60 mL Topical Once Erskine Emery W, PA-C      . lactated ringers infusion   Intravenous Continuous Hatchett, Mateo Flow, MD      . tranexamic acid (CYKLOKAPRON) IVPB 1,000 mg  1,000 mg Intravenous To OR Mcarthur Rossetti, MD       Allergies  Allergen Reactions  . Lisinopril Cough  . Relafen [Nabumetone] Other (See Comments)    Unsure of exact reaction.  . Statins     Muscle pain  . Tape     Developed blisters from clear tape used over cath site 10/2016  . Welchol [Colesevelam] Other (See Comments)    Muscle pain  . Zetia [Ezetimibe] Other (See Comments)    Muscle pain.  . Neosporin [Neomycin-Bacitracin Zn-Polymyx] Other (See Comments)    Cause wound/scratch to worsen  . Zinc Oxide Rash    Social History   Tobacco Use  . Smoking status: Former Smoker    Packs/day: 2.00    Years: 40.00    Pack years: 80.00     Types: Cigarettes    Last attempt to quit: 08/02/1989    Years since quitting: 28.7  . Smokeless tobacco: Never Used  Substance Use Topics  . Alcohol use: Yes    Alcohol/week: 14.0 standard drinks    Types: 7 Glasses of wine, 7 Standard drinks or equivalent per week    Family History  Problem Relation Age of Onset  . Cancer - Lung Mother   . Heart attack Father   . Cancer - Lung Brother   . Cancer - Lung Maternal Aunt      Review of Systems  Musculoskeletal: Positive for joint pain.  All other systems reviewed and are negative.   Objective:  Physical Exam  Constitutional: She is oriented to person, place, and time. She appears well-nourished.  HENT:  Head: Normocephalic.  Right Ear: Decreased hearing is noted.  Left Ear: Decreased hearing is noted.  Eyes: Pupils are equal, round, and reactive to light.  Neck: Normal range of motion.  Cardiovascular: Normal rate.  Respiratory: Effort normal.  GI: Soft.  Musculoskeletal:     Left knee: She exhibits decreased range of motion, swelling and bony tenderness. Tenderness found. Medial joint line and lateral joint line tenderness noted.  Neurological: She is alert and oriented to person, place, and time.  Skin: Skin is warm and dry.  Psychiatric: She has a normal mood and affect.    Vital signs in last 24 hours: Temp:  [98.4 F (36.9 C)] 98.4 F (36.9 C) (02/18 1206) Pulse Rate:  [82] 82 (02/18 1206) Resp:  [18] 18 (02/18 1206) BP: (192)/(68) 192/68 (02/18 1206) SpO2:  [93 %] 93 % (02/18 1206)  Labs:   Estimated body mass index is 28.36 kg/m as calculated from the following:   Height as of 04/03/18: 5\' 6"  (1.676 m).   Weight as of 04/03/18: 79.7 kg.   Imaging Review Plain radiographs demonstrate severe degenerative joint disease of the left knee(s). The overall alignment isneutral. The bone quality appears to be good for age and reported activity level.      Assessment/Plan:  End stage arthritis, left knee    The patient history, physical examination, clinical judgment of the provider and imaging studies are consistent with end stage degenerative joint disease of the left knee(s) and total knee arthroplasty is deemed medically necessary. The treatment options including medical management, injection therapy arthroscopy and arthroplasty were discussed at length. The risks and benefits of total knee arthroplasty were presented and reviewed. The risks due to aseptic loosening, infection, stiffness, patella tracking problems, thromboembolic complications and other imponderables were discussed. The patient acknowledged the explanation, agreed to proceed with the plan and consent was signed. Patient is being admitted for inpatient treatment for surgery, pain control, PT, OT, prophylactic antibiotics, VTE prophylaxis, progressive ambulation and ADL's and discharge planning. The patient is planning to be discharged home vs skilled nursing depending on her mobility    Anticipated LOS equal to or greater than 2 midnights due to - Age 39 and older with one or more of the following:  - Obesity  - Expected need for hospital services (PT, OT, Nursing) required for safe  discharge  - Anticipated need for postoperative skilled nursing care or inpatient rehab  - Active co-morbidities: Coronary Artery Disease OR   - Unanticipated findings during/Post Surgery: None  - Patient is a high risk of re-admission due to: None

## 2018-04-12 ENCOUNTER — Encounter (HOSPITAL_COMMUNITY): Payer: Self-pay | Admitting: General Practice

## 2018-04-12 ENCOUNTER — Telehealth (INDEPENDENT_AMBULATORY_CARE_PROVIDER_SITE_OTHER): Payer: Self-pay

## 2018-04-12 DIAGNOSIS — I129 Hypertensive chronic kidney disease with stage 1 through stage 4 chronic kidney disease, or unspecified chronic kidney disease: Secondary | ICD-10-CM | POA: Diagnosis not present

## 2018-04-12 DIAGNOSIS — Z79890 Hormone replacement therapy (postmenopausal): Secondary | ICD-10-CM | POA: Diagnosis not present

## 2018-04-12 DIAGNOSIS — M8580 Other specified disorders of bone density and structure, unspecified site: Secondary | ICD-10-CM | POA: Diagnosis not present

## 2018-04-12 DIAGNOSIS — Z801 Family history of malignant neoplasm of trachea, bronchus and lung: Secondary | ICD-10-CM | POA: Diagnosis not present

## 2018-04-12 DIAGNOSIS — I2510 Atherosclerotic heart disease of native coronary artery without angina pectoris: Secondary | ICD-10-CM | POA: Diagnosis not present

## 2018-04-12 DIAGNOSIS — E785 Hyperlipidemia, unspecified: Secondary | ICD-10-CM | POA: Diagnosis not present

## 2018-04-12 DIAGNOSIS — Z6828 Body mass index (BMI) 28.0-28.9, adult: Secondary | ICD-10-CM | POA: Diagnosis not present

## 2018-04-12 DIAGNOSIS — N182 Chronic kidney disease, stage 2 (mild): Secondary | ICD-10-CM | POA: Diagnosis not present

## 2018-04-12 DIAGNOSIS — Z888 Allergy status to other drugs, medicaments and biological substances status: Secondary | ICD-10-CM | POA: Diagnosis not present

## 2018-04-12 DIAGNOSIS — Z8249 Family history of ischemic heart disease and other diseases of the circulatory system: Secondary | ICD-10-CM | POA: Diagnosis not present

## 2018-04-12 DIAGNOSIS — Z7982 Long term (current) use of aspirin: Secondary | ICD-10-CM | POA: Diagnosis not present

## 2018-04-12 DIAGNOSIS — H9190 Unspecified hearing loss, unspecified ear: Secondary | ICD-10-CM | POA: Diagnosis not present

## 2018-04-12 DIAGNOSIS — M1712 Unilateral primary osteoarthritis, left knee: Secondary | ICD-10-CM | POA: Diagnosis not present

## 2018-04-12 DIAGNOSIS — K589 Irritable bowel syndrome without diarrhea: Secondary | ICD-10-CM | POA: Diagnosis not present

## 2018-04-12 DIAGNOSIS — G4733 Obstructive sleep apnea (adult) (pediatric): Secondary | ICD-10-CM | POA: Diagnosis not present

## 2018-04-12 DIAGNOSIS — M25562 Pain in left knee: Secondary | ICD-10-CM | POA: Diagnosis present

## 2018-04-12 DIAGNOSIS — E669 Obesity, unspecified: Secondary | ICD-10-CM | POA: Diagnosis present

## 2018-04-12 DIAGNOSIS — E039 Hypothyroidism, unspecified: Secondary | ICD-10-CM | POA: Diagnosis present

## 2018-04-12 DIAGNOSIS — Z981 Arthrodesis status: Secondary | ICD-10-CM | POA: Diagnosis not present

## 2018-04-12 DIAGNOSIS — Z91048 Other nonmedicinal substance allergy status: Secondary | ICD-10-CM | POA: Diagnosis not present

## 2018-04-12 DIAGNOSIS — M5136 Other intervertebral disc degeneration, lumbar region: Secondary | ICD-10-CM | POA: Diagnosis not present

## 2018-04-12 DIAGNOSIS — Z87891 Personal history of nicotine dependence: Secondary | ICD-10-CM | POA: Diagnosis not present

## 2018-04-12 DIAGNOSIS — K219 Gastro-esophageal reflux disease without esophagitis: Secondary | ICD-10-CM | POA: Diagnosis present

## 2018-04-12 DIAGNOSIS — J449 Chronic obstructive pulmonary disease, unspecified: Secondary | ICD-10-CM | POA: Diagnosis not present

## 2018-04-12 DIAGNOSIS — Z953 Presence of xenogenic heart valve: Secondary | ICD-10-CM | POA: Diagnosis not present

## 2018-04-12 LAB — CBC
HCT: 38.7 % (ref 36.0–46.0)
Hemoglobin: 12.5 g/dL (ref 12.0–15.0)
MCH: 29.6 pg (ref 26.0–34.0)
MCHC: 32.3 g/dL (ref 30.0–36.0)
MCV: 91.7 fL (ref 80.0–100.0)
Platelets: 158 10*3/uL (ref 150–400)
RBC: 4.22 MIL/uL (ref 3.87–5.11)
RDW: 13.5 % (ref 11.5–15.5)
WBC: 9.3 10*3/uL (ref 4.0–10.5)
nRBC: 0 % (ref 0.0–0.2)

## 2018-04-12 LAB — BASIC METABOLIC PANEL
Anion gap: 10 (ref 5–15)
BUN: 14 mg/dL (ref 8–23)
CHLORIDE: 104 mmol/L (ref 98–111)
CO2: 25 mmol/L (ref 22–32)
Calcium: 8.6 mg/dL — ABNORMAL LOW (ref 8.9–10.3)
Creatinine, Ser: 1.22 mg/dL — ABNORMAL HIGH (ref 0.44–1.00)
GFR calc Af Amer: 46 mL/min — ABNORMAL LOW (ref >60)
GFR calc non Af Amer: 40 mL/min — ABNORMAL LOW (ref >60)
Glucose, Bld: 155 mg/dL — ABNORMAL HIGH (ref 70–99)
Potassium: 4.3 mmol/L (ref 3.5–5.1)
Sodium: 139 mmol/L (ref 135–145)

## 2018-04-12 MED ORDER — MAGNESIUM OXIDE 400 (241.3 MG) MG PO TABS
400.0000 mg | ORAL_TABLET | Freq: Every day | ORAL | Status: DC
  Administered 2018-04-12 – 2018-04-15 (×4): 400 mg via ORAL
  Filled 2018-04-12 (×4): qty 1

## 2018-04-12 MED ORDER — MECLIZINE HCL 12.5 MG PO TABS
12.5000 mg | ORAL_TABLET | Freq: Three times a day (TID) | ORAL | Status: DC | PRN
  Administered 2018-04-12: 12.5 mg via ORAL
  Filled 2018-04-12 (×3): qty 1

## 2018-04-12 NOTE — Telephone Encounter (Signed)
Roj at [facility] on [address] needs an order for Meclizine.  Stated that patient was having some dizziness.  Cb# is [phone].  Please advise.  Thank you.

## 2018-04-12 NOTE — Progress Notes (Signed)
Subjective: 1 Day Post-Op Procedure(s) (LRB): LEFT TOTAL KNEE ARTHROPLASTY (Left) Patient reports pain as moderate.    Objective: Vital signs in last 24 hours: Temp:  [97 F (36.1 C)-98.4 F (36.9 C)] 98.2 F (36.8 C) (02/18 2129) Pulse Rate:  [29-91] 70 (02/19 0529) Resp:  [12-19] 18 (02/19 0529) BP: (121-192)/(57-100) 136/58 (02/19 0529) SpO2:  [91 %-100 %] 99 % (02/19 0529) Weight:  [79.7 kg] 79.7 kg (02/18 1217)  Intake/Output from previous day: 02/18 0701 - 02/19 0700 In: 2140 [P.O.:440; I.V.:1600; IV Piggyback:100] Out: 175 [Blood:175] Intake/Output this shift: No intake/output data recorded.  Recent Labs    04/12/18 0350  HGB 12.5   Recent Labs    04/12/18 0350  WBC 9.3  RBC 4.22  HCT 38.7  PLT 158   Recent Labs    04/12/18 0350  NA 139  K 4.3  CL 104  CO2 25  BUN 14  CREATININE 1.22*  GLUCOSE 155*  CALCIUM 8.6*   No results for input(s): LABPT, INR in the last 72 hours.  Sensation intact distally Intact pulses distally Dorsiflexion/Plantar flexion intact Incision: dressing C/D/I   Assessment/Plan: 1 Day Post-Op Procedure(s) (LRB): LEFT TOTAL KNEE ARTHROPLASTY (Left) Up with therapy Discharge to SNF   Anticipated LOS equal to or greater than 2 midnights due to - Age 83 and older with one or more of the following:  - Obesity  - Expected need for hospital services (PT, OT, Nursing) required for safe  discharge  - Anticipated need for postoperative skilled nursing care or inpatient rehab  - Active co-morbidities: None OR   - Unanticipated findings during/Post Surgery: None  - Patient is a high risk of re-admission due to: None    Mcarthur Rossetti 04/12/2018, 7:53 AM

## 2018-04-12 NOTE — Evaluation (Signed)
OT Evaluation  At this time, OT agrees with the plan for SNF post-acute due to increased need for assist with transfers, ADL, and Pt lives alone. Full OT note to follow. Thank you!  Hulda Humphrey OTR/L Acute Rehabilitation Services Pager: [phone] Office: [phone]  Occupational Therapy Evaluation Patient Details Name: Becky Mccall MRN: [id number] DOB: 01/03/1930 Today's Date: 04/12/2018    History of Present Illness 85 y.o. female, has a history of pain and functional disability in the left knee due to arthritis and has failed non-surgical conservative treatments. s/p L TKA 04/11/2018. PMH significant for CAD, TAVR, COPD, vertigo, HTN, Brain aneurysm, CKD stage II             Time: 8453-6468 OT Time Calculation (min): 33 min Charges:  OT General Charges $OT Visit: 1 Visit OT Evaluation $OT Eval Moderate Complexity: 1 Mod OT Treatments $Self Care/Home Management : 8-22 mins  Jaci Carrel 04/12/2018, 5:42 PM

## 2018-04-12 NOTE — Care Management (Signed)
Spoke w patient's daughter [name] at bedside who states that patient is planning to go to [facility]. Will discuss w CSW.

## 2018-04-12 NOTE — Discharge Instructions (Signed)

## 2018-04-12 NOTE — Telephone Encounter (Signed)
Please advise 

## 2018-04-12 NOTE — Clinical Social Work Note (Signed)
Clinical Social Work Assessment  Patient Details  Name: Becky Mccall MRN: [id number] Date of Birth: March 27, 1929  Date of referral:  04/12/18               Reason for consult:  Facility Placement, Discharge Planning                Permission sought to share information with:  Family Supports Permission granted to share information::  Yes, Verbal Permission Granted  Name::     Becky [facility]ccall [facility]  Agency::     Relationship::  Son, Daughter-in-law, daugher  Contact Information:  [place] - [phone]  Housing/Transportation Living arrangements for the past 2 months:  [place] of Information:  Patient, Adult Children Patient Interpreter Needed:  None Criminal Activity/Legal Involvement Pertinent to Current Situation/Hospitalization:  No - Comment as needed Significant Relationships:  Adult Children, Other Family Members Lives with:  Self Do you feel safe going back to the place where you live?  No Need for family participation in patient care:     Care giving concerns:  Patient and son Becky Mccall in agreement that ST rehab will be beneficial for patient before returning home.  Social Worker assessment / plan:  CSW talked with patient at bedside regarding her discharge disposition. Becky Mccall was sitting up in a chair and was alert, oriented and open to talking with CSW. She informed SW that she is hard of hearing in her left ear and deaf in her right ear. She wears a hearing aide in her left ear and she also wears glasses.   Patient advised of reason for visit and recommendation of ST rehab and Becky Mccall in agreement and informed CSW that she will be going to [facility] in [place]. CSW advised by patient and her son Becky Mccall that his wife works part-time at the facility and retired from [employer]. CSW also informed that patient has been to [facility] before.  Employment status:  Retired Biomedical scientist) PT Recommendations:  [place] / Referral to community resources:  Other (Comment Required)(CSW advised of facility preference)   Patient/Family's Response to care:  No concerns expressed by patient or son regarding her care during hospitalization.  Patient/Family's Understanding of and Emotional Response to Diagnosis, Current Treatment, and Prognosis:  Becky Mccall and her son expressed understanding of her need for ST rehab.  Emotional Assessment Appearance:  Appears stated age Attitude/Demeanor/Rapport:  Engaged Affect (typically observed):  Appropriate, Pleasant Orientation:  Oriented to Self, Oriented to Place, Oriented to  Time, Oriented to Situation Alcohol / Substance use:  Tobacco Use, Alcohol Use, Illicit Drugs(Patient reported that she quit smoking, drinks 7 glasses of wine per week and does not use illicit drugs) Psych involvement (Current and /or in the community):  No (Comment)  Discharge Needs  Concerns to be addressed:  Discharge Planning Concerns Readmission within the last 30 days:  No Current discharge risk:  None Barriers to Discharge:  Continued Medical Work up, [place], Becky Mcbean Bradley, LCSW 04/12/2018, 6:39 PM

## 2018-04-12 NOTE — Plan of Care (Signed)

## 2018-04-12 NOTE — Evaluation (Signed)
Physical Therapy Evaluation Patient Details Name: Becky Mccall MRN: [id number] DOB: 1929/09/23 Today's Date: 04/12/2018   History of Present Illness  85 y.o. female, has a history of pain and functional disability in the left knee due to arthritis and has failed non-surgical conservative treatments. s/p L TKA 04/11/2018. PMH significant for CAD, TAVR, COPD, vertigo, HTN, Brain aneurysm, CKD stage II  Clinical Impression  Pt is s/p TKA resulting in the deficits listed below (see PT Problem List). Pt is limited in safe mobility by L knee pain and decreased strength and ROM secondary to surgical intervention. Pt is minA for bed mobility, transfers and ambulation of 15 feet to bathroom. Pt with episode of dizziness secondary to vertigo and required modA to transfer from toilet to recliner.  Pt will benefit from skilled PT to increase their independence and safety with mobility to allow discharge to the venue listed below.      Follow Up Recommendations Follow surgeon's recommendation for DC plan and follow-up therapies(plans for [facility] in [place])    Equipment Recommendations  (TBD at next venue)       Precautions / Restrictions Precautions Precautions: Fall Precaution Comments: hx of vertigo, no falls currently but dizziness Restrictions Weight Bearing Restrictions: Yes LLE Weight Bearing: Weight bearing as tolerated      Mobility  Bed Mobility Overal bed mobility: Needs Assistance Bed Mobility: Supine to Sit     Supine to sit: Supervision     General bed mobility comments: supervision for safety  Transfers Overall transfer level: Needs assistance Equipment used: Rolling walker (2 wheeled) Transfers: Sit to/from Stand Sit to Stand: Min assist;Mod assist         General transfer comment: minA for power up and steadying, vc for hand placement for power up, modA for sit>stand after dizziness sitting on toilet   Ambulation/Gait Ambulation/Gait assistance: Mod  assist;Min assist Gait Distance (Feet): 15 Feet Assistive device: Rolling walker (2 wheeled) Gait Pattern/deviations: Step-to pattern;Decreased step length - right;Decreased stance time - left;Decreased weight shift to left;Antalgic;Trunk flexed Gait velocity: slowed Gait velocity interpretation: <1.8 ft/sec, indicate of risk for recurrent falls General Gait Details: minA for steadying with RW for ambulation into bathroom, modA for steadying secondary to dizziness, to take lateral steps to recliner        Balance Overall balance assessment: Needs assistance Sitting-balance support: Feet supported;No upper extremity supported Sitting balance-Leahy Scale: Good     Standing balance support: Single extremity supported;During functional activity Standing balance-Leahy Scale: Poor Standing balance comment: requires at least single UE support                             Pertinent Vitals/Pain Pain Assessment: 0-10 Pain Score: 5  Pain Location: L knee Pain Descriptors / Indicators: Aching;Sore;Tightness Pain Intervention(s): Limited activity within patient's tolerance;Monitored during session;Repositioned    Home Living Family/patient expects to be discharged to:: Skilled nursing facility([facility] in [place], family works there) Living Arrangements: Alone                    Prior Function Level of Independence: Independent         Comments: uses RW occasionally due to increased knee pain      Hand Dominance   Dominant Hand: Right    Extremity/Trunk Assessment   Upper Extremity Assessment Upper Extremity Assessment: Overall WFL for tasks assessed    Lower Extremity Assessment Lower Extremity Assessment: LLE deficits/detail LLE Deficits /  Details: hip and ankle ROM WFL, strength grossly 4/5, knee ROM and strength limited by surgical intervention  LLE Sensation: WNL LLE Coordination: decreased fine motor       Communication   Communication: HOH   Cognition Arousal/Alertness: Awake/alert Behavior During Therapy: WFL for tasks assessed/performed Overall Cognitive Status: Within Functional Limits for tasks assessed                                        General Comments General comments (skin integrity, edema, etc.): Pt with history of vertigo. Pt with dizziness while on toilet, requested to take meclazine in her purse. Advised RN and RN called MD. Pt transferred from toilet directly to recliner. Daughter present during session    Exercises Total Joint Exercises Ankle Circles/Pumps: AROM;Both;20 reps;Supine Long Arc Quad: AROM;Left;10 reps;Seated Knee Flexion: AROM;Left;10 reps;Seated   Assessment/Plan    PT Assessment Patient needs continued PT services  PT Problem List Decreased strength;Decreased range of motion;Decreased activity tolerance;Decreased balance;Decreased mobility;Decreased coordination;Pain       PT Treatment Interventions DME instruction;Gait training;Functional mobility training;Therapeutic activities;Therapeutic exercise;Balance training;Cognitive remediation;Patient/family education    PT Goals (Current goals can be found in the Care Plan section)  Acute Rehab PT Goals Patient Stated Goal: have less knee pain PT Goal Formulation: With patient/family Time For Goal Achievement: 04/26/18 Potential to Achieve Goals: Good    Frequency 7X/week   Barriers to discharge Decreased caregiver support         AM-PAC PT "6 Clicks" Mobility  Outcome Measure Help needed turning from your back to your side while in a flat bed without using bedrails?: A Little Help needed moving from lying on your back to sitting on the side of a flat bed without using bedrails?: A Little Help needed moving to and from a bed to a chair (including a wheelchair)?: A Little Help needed standing up from a chair using your arms (e.g., wheelchair or bedside chair)?: A Little Help needed to walk in hospital room?: A  Little Help needed climbing 3-5 steps with a railing? : A Lot 6 Click Score: 17    End of Session Equipment Utilized During Treatment: Gait belt Activity Tolerance: Other (comment)(limited due to feeling of vertigo) Patient left: in chair;with call bell/phone within reach;with chair alarm set;with family/visitor present Nurse Communication: Mobility status;Precautions;Weight bearing status PT Visit Diagnosis: Unsteadiness on feet (R26.81);Other abnormalities of gait and mobility (R26.89);Muscle weakness (generalized) (M62.81);Difficulty in walking, not elsewhere classified (R26.2);Dizziness and giddiness (R42);Pain Pain - Right/Left: Left Pain - part of body: Knee    Time: 1026-1109 PT Time Calculation (min) (ACUTE ONLY): 43 min   Charges:   PT Evaluation $PT Eval Moderate Complexity: 1 Mod PT Treatments $Gait Training: 8-22 mins $Therapeutic Activity: 8-22 mins        Gradyn Shein B. Migdalia Dk PT, DPT Acute Rehabilitation Services Pager [phone] Office [phone]   [place] 04/12/2018, 11:28 AM

## 2018-04-13 MED ORDER — ASPIRIN 81 MG PO CHEW: 81.0000 mg | CHEWABLE_TABLET | Freq: Two times a day (BID) | ORAL | 0 refills | Status: AC

## 2018-04-13 MED ORDER — METHOCARBAMOL 500 MG PO TABS: 500.0000 mg | ORAL_TABLET | Freq: Four times a day (QID) | ORAL | 0 refills | Status: DC | PRN

## 2018-04-13 MED ORDER — OXYCODONE HCL 5 MG PO TABS: 5.0000 mg | ORAL_TABLET | ORAL | 0 refills | Status: DC | PRN

## 2018-04-13 NOTE — Progress Notes (Signed)
Patient having episodes of confusion.  Safety measures continues

## 2018-04-13 NOTE — Progress Notes (Signed)
The patient appears confused.  She is worried that something is going on with her family and thinks that someone is keeping something from her.  The nurse called her daughter [name] and she is presently on the phone talking to her.

## 2018-04-13 NOTE — Progress Notes (Signed)
Subjective: 2 Days Post-Op Procedure(s) (LRB): LEFT TOTAL KNEE ARTHROPLASTY (Left) Patient reports pain as moderate.  Complaining of constipation.Positive flatus.     Objective: Vital signs in last 24 hours: Temp:  [98.2 F (36.8 C)-100.3 F (37.9 C)] 98.4 F (36.9 C) (02/20 0813) Pulse Rate:  [78-94] 83 (02/20 0902) Resp:  [16-20] 18 (02/20 0902) BP: (127-178)/(48-63) 178/59 (02/20 0813) SpO2:  [95 %-97 %] 95 % (02/20 0902)  Intake/Output from previous day: 02/19 0701 - 02/20 0700 In: 113 [I.V.:113] Out: -  Intake/Output this shift: No intake/output data recorded.  Recent Labs    04/12/18 0350  HGB 12.5   Recent Labs    04/12/18 0350  WBC 9.3  RBC 4.22  HCT 38.7  PLT 158   Recent Labs    04/12/18 0350  NA 139  K 4.3  CL 104  CO2 25  BUN 14  CREATININE 1.22*  GLUCOSE 155*  CALCIUM 8.6*   No results for input(s): LABPT, INR in the last 72 hours.  Left lower extremity: Dorsiflexion/Plantar flexion intact Incision: dressing C/D/I Compartment soft   Assessment/Plan: 2 Days Post-Op Procedure(s) (LRB): LEFT TOTAL KNEE ARTHROPLASTY (Left) Up with therapy Discharge to SNF    [facility] 04/13/2018, 10:28 AM

## 2018-04-13 NOTE — Progress Notes (Signed)
Physical Therapy Treatment Patient Details Name: Becky Mccall MRN: [id number] DOB: 04-27-1929 Today's Date: 04/13/2018    History of Present Illness 85 y.o. female, has a history of pain and functional disability in the left knee due to arthritis and has failed non-surgical conservative treatments. s/p L TKA 04/11/2018. PMH significant for CAD, TAVR, COPD, vertigo, HTN, Brain aneurysm, CKD stage II    PT Comments    Patient seen for mobility progression. Pt requires min/mod A for OOB mobility and tolerated short distance giat this session. Continue to progress as tolerated.    Follow Up Recommendations  Follow surgeon's recommendation for DC plan and follow-up therapies(plans for [facility] in [place])     Equipment Recommendations  (TBD at next venue)    Recommendations for Other Services       Precautions / Restrictions Precautions Precautions: Fall Precaution Comments: hx of vertigo, no falls currently but dizziness Restrictions Weight Bearing Restrictions: Yes LLE Weight Bearing: Weight bearing as tolerated    Mobility  Bed Mobility Overal bed mobility: Needs Assistance Bed Mobility: Supine to Sit     Supine to sit: Min assist     General bed mobility comments: assist to elevate trunk into sitting; cues for sequencing and use of rail  Transfers Overall transfer level: Needs assistance Equipment used: Rolling walker (2 wheeled) Transfers: Sit to/from Stand Sit to Stand: Min assist;From elevated surface         General transfer comment: assist to power up from EOB and BSC; cues for safe hand placement  Ambulation/Gait Ambulation/Gait assistance: Mod assist;Min assist Gait Distance (Feet): (40 ft total) Assistive device: Rolling walker (2 wheeled) Gait Pattern/deviations: Step-to pattern;Decreased step length - right;Decreased stance time - left;Decreased weight shift to left;Antalgic;Trunk flexed Gait velocity: decreased   General Gait Details: assist to  steady and to safely manage RW especially when turning; pt turns very slowly due to fear of worsening dizziness given history of vertigo; directional cues required as well as vc for increased R step length/height and maintaining safe proximity to [employer] Mobility    Modified Rankin (Stroke Patients Only)       Balance Overall balance assessment: Needs assistance Sitting-balance support: Feet supported;No upper extremity supported Sitting balance-Leahy Scale: Good     Standing balance support: During functional activity;Bilateral upper extremity supported Standing balance-Leahy Scale: Poor                              Cognition Arousal/Alertness: Awake/alert Behavior During Therapy: WFL for tasks assessed/performed Overall Cognitive Status: Within Functional Limits for tasks assessed                                 General Comments: HOH and has hearing aid for L ear so speak to her on L side      Exercises Total Joint Exercises Ankle Circles/Pumps: AROM;Both;20 reps;Supine Quad Sets: AROM;Strengthening;Left;10 reps Heel Slides: AAROM;Strengthening;Left;10 reps    General Comments        Pertinent Vitals/Pain Pain Assessment: Faces Faces Pain Scale: Hurts little more Pain Location: L knee Pain Descriptors / Indicators: Sore;Grimacing;Guarding Pain Intervention(s): Limited activity within patient's tolerance;Monitored during session;Repositioned    Home Living  Prior Function            PT Goals (current goals can now be found in the care plan section) Acute Rehab PT Goals Patient Stated Goal: have less knee pain Progress towards PT goals: Progressing toward goals    Frequency    7X/week      PT Plan Current plan remains appropriate    Co-evaluation              AM-PAC PT "6 Clicks" Mobility   Outcome Measure  Help needed turning from your back to your  side while in a flat bed without using bedrails?: A Little Help needed moving from lying on your back to sitting on the side of a flat bed without using bedrails?: A Little Help needed moving to and from a bed to a chair (including a wheelchair)?: A Little Help needed standing up from a chair using your arms (e.g., wheelchair or bedside chair)?: A Little Help needed to walk in hospital room?: A Little Help needed climbing 3-5 steps with a railing? : A Lot 6 Click Score: 17    End of Session Equipment Utilized During Treatment: Gait belt Activity Tolerance: Patient tolerated treatment well Patient left: in chair;with call bell/phone within reach;with chair alarm set;with family/visitor present Nurse Communication: Mobility status PT Visit Diagnosis: Unsteadiness on feet (R26.81);Other abnormalities of gait and mobility (R26.89);Muscle weakness (generalized) (M62.81);Difficulty in walking, not elsewhere classified (R26.2);Dizziness and giddiness (R42);Pain Pain - Right/Left: Left Pain - part of body: Knee     Time: 0915-0955 PT Time Calculation (min) (ACUTE ONLY): 40 min  Charges:  $Gait Training: 23-37 mins $Therapeutic Exercise: 8-22 mins                     Earney Navy, PTA Acute Rehabilitation Services Pager: [phone] Office: [phone]     Darliss Cheney 04/13/2018, 10:07 AM

## 2018-04-13 NOTE — NC FL2 (Signed)
[place] LEVEL OF CARE SCREENING TOOL     IDENTIFICATION  Patient Name: Becky Mccall Birthdate: [birth year] Sex: female Admission Date (Current Location): 04/11/2018  [facility] and [place] Number:  Herbalist and Address:  [facility], [place] [address], [place], [place] [address]      Provider Number: [id number]  Attending Physician Name and Address:  Mcarthur Rossetti  Relative Name and Phone Number:  [name] (daughter) [phone]    Current Level of Care: Hospital Recommended Level of Care: [place] Prior Approval Number:    Date Approved/Denied:   PASRR Number: [id number]  Discharge Plan: SNF    Current Diagnoses: Patient Active Problem List   Diagnosis Date Noted  . Status post total left knee replacement 04/11/2018  . CAD (coronary artery disease), native coronary artery 01/24/2018  . PVC's (premature ventricular contractions) 10/11/2017  . Leg cramps 10/11/2017  . Essential hypertension 10/10/2017  . Chronic pain of right knee 03/17/2017  . Unilateral primary osteoarthritis, left knee 03/17/2017  . Unilateral primary osteoarthritis, right knee 03/17/2017  . Chronic pain of left knee 02/07/2017  . S/P TAVR (transcatheter aortic valve replacement) 12/07/2016  . Severe aortic stenosis 12/07/2016  . Fibrocystic breast disease   . IBS (irritable bowel syndrome)   . Cervical spondylolysis   . Osteopenia   . Vitamin D deficiency   . DDD (degenerative disc disease), lumbar   . Squamous cell skin cancer   . Carotid stenosis   . OSA (obstructive sleep apnea)   . Colon polyp   . Vertigo 05/04/2013  . Hard of hearing   . Hyperlipidemia   . COPD (chronic obstructive pulmonary disease) ([place])   . Hypothyroidism   . GERD (gastroesophageal reflux disease)   . Spinal stenosis in cervical region 02/19/2013    Orientation RESPIRATION BLADDER Height & Weight     Self, Time, Situation, Place  Normal Continent Weight: 79.7 kg Height:  5\' 6"  (167.6 cm)  BEHAVIORAL SYMPTOMS/MOOD NEUROLOGICAL BOWEL NUTRITION STATUS      Continent Diet(see discharge summary)  AMBULATORY STATUS COMMUNICATION OF NEEDS Skin   Limited Assist Verbally Surgical wounds(left knee and left kneck closed surgical incisions)                       Personal Care Assistance Level of Assistance  Bathing, Feeding, Dressing, Total care Bathing Assistance: Limited assistance Feeding assistance: Independent Dressing Assistance: Limited assistance Total Care Assistance: Limited assistance   Functional Limitations Info  Sight, Hearing, Speech Sight Info: Adequate Hearing Info: Impaired(R - deaf, L- impaired hearing with hearing aids) Speech Info: Adequate    SPECIAL CARE FACTORS FREQUENCY  PT (By licensed PT), OT (By licensed OT)     PT Frequency: min 5x weekly OT Frequency: min 5x weekly            Contractures Contractures Info: Not present    Additional Factors Info  Code Status, Allergies Code Status Info: full Allergies Info: Lisinopril, Relafen (nabumetone), Statins, Tape, Welchol (colesevelam), Zetia (ezetimibe), Neosporin (neomycin bacitracin Zn-polymyx), Zinc Oxide           Current Medications (04/13/2018):  This is the current hospital active medication list Current Facility-Administered Medications  Medication Dose Route Frequency Provider Last Rate Last Dose  . 0.9 %  sodium chloride infusion   Intravenous Continuous Mcarthur Rossetti, MD   Stopped at 04/12/18 0845  . acetaminophen (TYLENOL) tablet 325-650 mg  325-650 mg  Oral Q6H PRN Mcarthur Rossetti, MD   650 mg at 04/12/18 2142  . alum & mag hydroxide-simeth (MAALOX/MYLANTA) 200-200-20 MG/5ML suspension 30 mL  30 mL Oral Q4H PRN Mcarthur Rossetti, MD      . amLODipine (NORVASC) tablet 2.5 mg  2.5 mg Oral Daily Mcarthur Rossetti, MD   2.5 mg at 04/12/18 1253  . aspirin chewable tablet 81 mg  81 mg Oral BID  Mcarthur Rossetti, MD   81 mg at 04/13/18 1021  . bisacodyl (DULCOLAX) suppository 10 mg  10 mg Rectal Daily PRN Mcarthur Rossetti, MD      . cholecalciferol (VITAMIN D3) tablet 6,000 Units  6,000 Units Oral Daily Mcarthur Rossetti, MD   6,000 Units at 04/13/18 1020  . diphenhydrAMINE (BENADRYL) 12.5 MG/5ML elixir 12.5-25 mg  12.5-25 mg Oral Q4H PRN Mcarthur Rossetti, MD      . docusate sodium (COLACE) capsule 100 mg  100 mg Oral BID Mcarthur Rossetti, MD   100 mg at 04/13/18 1021  . HYDROmorphone (DILAUDID) injection 0.5-1 mg  0.5-1 mg Intravenous Q4H PRN Mcarthur Rossetti, MD   1 mg at 04/13/18 6010  . levothyroxine (SYNTHROID, LEVOTHROID) tablet 137 mcg  137 mcg Oral QAC breakfast Mcarthur Rossetti, MD   137 mcg at 04/13/18 [phone]  . magnesium oxide (MAG-OX) tablet 400 mg  400 mg Oral Daily Mcarthur Rossetti, MD   400 mg at 04/13/18 1021  . meclizine (ANTIVERT) tablet 12.5 mg  12.5 mg Oral TID PRN Mcarthur Rossetti, MD   12.5 mg at 04/12/18 1344  . menthol-cetylpyridinium (CEPACOL) lozenge 3 mg  1 lozenge Oral PRN Mcarthur Rossetti, MD       Or  . phenol (CHLORASEPTIC) mouth spray 1 spray  1 spray Mouth/Throat PRN Mcarthur Rossetti, MD      . methocarbamol (ROBAXIN) tablet 500 mg  500 mg Oral Q6H PRN Mcarthur Rossetti, MD   500 mg at 04/12/18 2115   Or  . methocarbamol (ROBAXIN) 500 mg in dextrose 5 % 50 mL IVPB  500 mg Intravenous Q6H PRN Mcarthur Rossetti, MD      . metoCLOPramide (REGLAN) tablet 5-10 mg  5-10 mg Oral Q8H PRN Mcarthur Rossetti, MD       Or  . metoCLOPramide (REGLAN) injection 5-10 mg  5-10 mg Intravenous Q8H PRN Mcarthur Rossetti, MD      . multivitamin with minerals tablet 1 tablet  1 tablet Oral Daily Mcarthur Rossetti, MD   1 tablet at 04/13/18 1020  . naphazoline-glycerin (CLEAR EYES REDNESS) ophth solution 1 drop  1 drop Both Eyes QID PRN Mcarthur Rossetti, MD      .  ondansetron [facility]) tablet 4 mg  4 mg Oral Q6H PRN Mcarthur Rossetti, MD       Or  . ondansetron [facility]) injection 4 mg  4 mg Intravenous Q6H PRN Mcarthur Rossetti, MD      . oxyCODONE (Oxy IR/ROXICODONE) immediate release tablet 10-15 mg  10-15 mg Oral Q4H PRN Mcarthur Rossetti, MD   10 mg at 04/13/18 5573  . oxyCODONE (Oxy IR/ROXICODONE) immediate release tablet 5-10 mg  5-10 mg Oral Q4H PRN Mcarthur Rossetti, MD   5 mg at 04/12/18 1711  . pantoprazole (PROTONIX) EC tablet 40 mg  40 mg Oral Daily Mcarthur Rossetti, MD   40 mg at 04/13/18 1021  . polyethylene glycol (MIRALAX / GLYCOLAX) packet  17 g  17 g Oral Daily PRN Mcarthur Rossetti, MD      . promethazine (PHENERGAN) tablet 12.5 mg  12.5 mg Oral Q6H PRN Mcarthur Rossetti, MD      . umeclidinium bromide (INCRUSE ELLIPTA) 62.5 MCG/INH 1 puff  1 puff Inhalation Daily Lore, Melissa A, RPH   1 puff at 04/13/18 0901  . vitamin B-12 (CYANOCOBALAMIN) tablet 1,000 mcg  1,000 mcg Oral Daily Mcarthur Rossetti, MD   1,000 mcg at 04/13/18 1020  . vitamin C (ASCORBIC ACID) tablet 500 mg  500 mg Oral Daily Mcarthur Rossetti, MD   500 mg at 04/13/18 1021  . vitamin E capsule 400 Units  400 Units Oral BID Mcarthur Rossetti, MD   400 Units at 04/13/18 1021     Discharge Medications: Please see discharge summary for a list of discharge medications.  Relevant Imaging Results:  Relevant Lab Results:   Additional Information SSN: [id number]  Alberteen Sam, LCSW

## 2018-04-13 NOTE — Plan of Care (Signed)
  Problem: Pain Managment: Goal: General experience of comfort will improve Outcome: Progressing   Problem: Safety: Goal: Ability to remain free from injury will improve Outcome: Progressing   Problem: Skin Integrity: Goal: Risk for impaired skin integrity will decrease Outcome: Progressing   

## 2018-04-13 NOTE — Progress Notes (Signed)
Full OT Evaluation (Late Entry)  Clinical Impression: PTA Pt independent in ADL and mobility. Lives alone - cooks, shops. Pt is hard of hearing and appreciates loud and slow speech. Pt is currently mod A for transfers, min A for mobility to bathroom for toilet transfer, set up for front peri care, requires sitting for grooming tasks, and max A for LB ADL (unable to perform figure 4 with LLE) Pt will benefit from skilled OT in the acute setting as well as afterwards at the SNF level. Next session to focus on tub transfer and continued practice with transfers.    04/12/18 1700  OT Visit Information  Last OT Received On 04/12/18  Assistance Needed +1  History of Present Illness 85 y.o. female, has a history of pain and functional disability in the left knee due to arthritis and has failed non-surgical conservative treatments. s/p L TKA 04/11/2018. PMH significant for CAD, TAVR, COPD, vertigo, HTN, Brain aneurysm, CKD stage II  Precautions  Precautions Fall  Precaution Comments hx of vertigo, no falls currently but dizziness  Restrictions  Weight Bearing Restrictions Yes  LLE Weight Bearing WBAT  Home Living  Family/patient expects to be discharged to: Skilled nursing facility ([facility] in [place], family works there)  Prior Function  Level of Independence Independent  Comments uses RW occasionally due to increased knee pain   Communication  Communication HOH  Pain Assessment  Pain Assessment 0-10  Pain Score 7  Pain Location L knee  Pain Descriptors / Indicators Aching;Sore;Tightness  Pain Intervention(s) Limited activity within patient's tolerance;Monitored during session;Patient requesting pain meds-RN notified;Repositioned;Ice applied  Cognition  Arousal/Alertness Awake/alert  Behavior During Therapy WFL for tasks assessed/performed  Overall Cognitive Status Within Functional Limits for tasks assessed  Upper Extremity Assessment  Upper Extremity Assessment Overall WFL for tasks  assessed  Lower Extremity Assessment  Lower Extremity Assessment Defer to PT evaluation  LLE Deficits / Details hip and ankle ROM WFL, strength grossly 4/5, knee ROM and strength limited by surgical intervention   LLE Sensation WNL  LLE Coordination decreased fine motor  ADL  Overall ADL's  Needs assistance/impaired  Eating/Feeding Set up;Sitting  Grooming Wash/dry face;Set up;Sitting  Grooming Details (indicate cue type and reason) unable to tolerate static standing and maintain balance for grooming tasks at sink  Upper Body Bathing Set up;Sitting  Lower Body Bathing Moderate assistance;Sitting/lateral leans  Upper Body Dressing  Set up;Sitting  Lower Body Dressing Maximal assistance;Sit to/from stand  Lower Body Dressing Details (indicate cue type and reason) Pt unable to perform figure 4 dressing with LLE, assist for boost with standing required  Toilet Transfer Moderate assistance;Ambulation;RW  Toilet Transfer Details (indicate cue type and reason) assist for boost and steady  Toileting- Water quality scientist and Hygiene Min guard;Sitting/lateral lean  Tub/Shower Transfer Details (indicate cue type and reason) verbally discussed and demonstrated AP transfer in room. Pt does have a tub bench - needs to practice in the gym  Functional mobility during ADLs Rolling walker;Cueing for sequencing;Minimal assistance  General ADL Comments decreased access to LB for ADL, decreased standing balance, decreased activity tolerance  Vision- History  Patient Visual Report No change from baseline  Bed Mobility  General bed mobility comments Pt OOB at beginning and end of session  Transfers  Overall transfer level Needs assistance  Equipment used Rolling walker (2 wheeled)  Transfers Sit to/from Stand  Sit to Stand Mod assist  General transfer comment mod A for power up, vc for safe hand placement with RW "I  think I did too many exercises this afternoon"  Balance  Overall balance assessment Needs  assistance  Sitting-balance support Feet supported;No upper extremity supported  Sitting balance-Leahy Scale Good  Standing balance support Single extremity supported;During functional activity  Standing balance-Leahy Scale Poor  Standing balance comment requires at least single UE support  General Comments  General comments (skin integrity, edema, etc.) history of vertigo, Dr Ninfa Linden present during session, performed dressing change  OT - End of Session  Equipment Utilized During Treatment Gait belt;Rolling walker  Activity Tolerance Patient tolerated treatment well  Patient left in chair;with call bell/phone within reach;with family/visitor present  Nurse Communication Mobility status;Patient requests pain meds  CPM Left Knee  CPM Left Knee Off  OT Assessment  OT Recommendation/Assessment Patient needs continued OT Services  OT Visit Diagnosis Unsteadiness on feet (R26.81);Other abnormalities of gait and mobility (R26.89);Pain  Pain - Right/Left Left  Pain - part of body Knee  OT Problem List Decreased strength;Decreased range of motion;Decreased activity tolerance;Impaired balance (sitting and/or standing);Decreased knowledge of use of DME or AE;Pain  Barriers to Discharge Decreased caregiver support  Barriers to Discharge Comments Pt lives alone  OT Plan  OT Frequency (ACUTE ONLY) Min 2X/week  OT Treatment/Interventions (ACUTE ONLY) Self-care/ADL training;DME and/or AE instruction;Therapeutic activities;Patient/family education;Balance training  AM-PAC OT "6 Clicks" Daily Activity Outcome Measure (Version 2)  Help from another person eating meals? 4  Help from another person taking care of personal grooming? 3  Help from another person toileting, which includes using toliet, bedpan, or urinal? 2  Help from another person bathing (including washing, rinsing, drying)? 2  Help from another person to put on and taking off regular upper body clothing? 4  Help from another person to  put on and taking off regular lower body clothing? 2  6 Click Score 17  OT Recommendation  Follow Up Recommendations SNF;Supervision/Assistance - 24 hour  OT Equipment Other (comment) (defer to next venue of care)  Individuals Consulted  Consulted and Agree with Results and Recommendations Patient;Family member/caregiver  Family Member Consulted son  Acute Rehab OT Goals  Patient Stated Goal have less knee pain  OT Goal Formulation With patient/family  Time For Goal Achievement 04/26/18  Potential to Achieve Goals Good  OT Time Calculation  OT Start Time (ACUTE ONLY) 1651  OT Stop Time (ACUTE ONLY) 1724  OT Time Calculation (min) 33 min  OT General Charges  $OT Visit 1 Visit  OT Evaluation  $OT Eval Moderate Complexity 1 Mod  OT Treatments  $Self Care/Home Management  8-22 mins  Written Expression  Dominant Hand Right   Hulda Humphrey OTR/L Acute Rehabilitation Services Pager: [phone] Office: [phone]

## 2018-04-13 NOTE — Progress Notes (Signed)
Occupational Therapy Treatment Patient Details Name: Becky Mccall MRN: [id number] DOB: 12-Sep-1929 Today's Date: 04/13/2018    History of present illness 85 y.o. female, has a history of pain and functional disability in the left knee due to arthritis and has failed non-surgical conservative treatments. s/p L TKA 04/11/2018. PMH significant for CAD, TAVR, COPD, vertigo, HTN, Brain aneurysm, CKD stage II   OT comments  Pt progressing toward OT goals. Pt receptive to engage in toileting. Due to decreased core strength pt required Mod A in supine to sit at EOB with assist to power trunk and bring legs to EOB. Mod A to power up to stand with RW and functional transfer. Several VCs required due to confusion and HOH. DC and frequency still appropriate. OT will continue to follow acutely.   Follow Up Recommendations  SNF;Supervision/Assistance - 24 hour    Equipment Recommendations  Other (comment)(defer to next venue of care)    Recommendations for Other Services      Precautions / Restrictions Precautions Precautions: Fall Precaution Comments: hx of vertigo, no falls currently but dizziness Restrictions Weight Bearing Restrictions: Yes LLE Weight Bearing: Weight bearing as tolerated       Mobility Bed Mobility Overal bed mobility: Needs Assistance Bed Mobility: Supine to Sit     Supine to sit: Mod assist     General bed mobility comments: assist to elevate trunk into sitting and assist with positionin LE to EOB for sitting; cues for sequencing and use of rail  Transfers Overall transfer level: Needs assistance Equipment used: Rolling walker (2 wheeled) Transfers: Sit to/from Stand Sit to Stand: From elevated surface;Mod assist         General transfer comment: assist to power up from EOB and BSC; cues for safe hand placement. Bed elevated for stand to sit transition due to increased pain.    Balance Overall balance assessment: Needs assistance Sitting-balance  support: Feet supported;No upper extremity supported Sitting balance-Leahy Scale: Good     Standing balance support: During functional activity;Bilateral upper extremity supported Standing balance-Leahy Scale: Poor Standing balance comment: requires at least single UE support                           ADL either performed or assessed with clinical judgement   ADL Overall ADL's : Needs assistance/impaired     Grooming: Set up;Sitting;Wash/dry hands Grooming Details (indicate cue type and reason): able to tolerate static standing and maintain balance for grooming tasks at sink, however, did complain of slight dizziness and nausea. BP checked 130/65 in sitting at bed.                 Toilet Transfer: Moderate assistance;Ambulation;RW;Cueing for safety;Cueing for sequencing;BSC Toilet Transfer Details (indicate cue type and reason): assist to power up to stand. Pt able to follow through complete sequence. VCs for safety and sequencing.  Toileting- Water quality scientist and Hygiene: Min guard;Sit to/from stand Toileting - Clothing Manipulation Details (indicate cue type and reason): Pt able to stand with RW for hygiene.      Functional mobility during ADLs: Rolling walker;Cueing for sequencing;Moderate assistance General ADL Comments: functional transfer to toilet, several VCs for safety and sequencing. Pt required additional time for mobility due to pain.      Vision       Perception     Praxis      Cognition Arousal/Alertness: Awake/alert Behavior During Therapy: WFL for tasks assessed/performed Overall Cognitive Status: Within Functional  Limits for tasks assessed                                 General Comments: HOH and has hearing aid for L ear so speak to her on L side. Required several VCs to complete tasks due to [facility]         Exercises     Shoulder Instructions       General Comments      Pertinent Vitals/ Pain       Pain  Assessment: 0-10 Pain Score: 5  Pain Location: L knee Pain Descriptors / Indicators: Sore;Grimacing;Guarding Pain Intervention(s): Limited activity within patient's tolerance;Monitored during session;Repositioned  Home Living                                          Prior Functioning/Environment              Frequency  Min 2X/week        Progress Toward Goals  OT Goals(current goals can now be found in the care plan section)  Progress towards OT goals: Progressing toward goals  Acute Rehab OT Goals Patient Stated Goal: have less knee pain OT Goal Formulation: With patient/family Time For Goal Achievement: 04/26/18 Potential to Achieve Goals: Good  Plan Discharge plan remains appropriate;Frequency remains appropriate    Co-evaluation                 AM-PAC OT "6 Clicks" Daily Activity     Outcome Measure   Help from another person eating meals?: None Help from another person taking care of personal grooming?: A Little Help from another person toileting, which includes using toliet, bedpan, or urinal?: A Lot Help from another person bathing (including washing, rinsing, drying)?: A Lot Help from another person to put on and taking off regular upper body clothing?: None Help from another person to put on and taking off regular lower body clothing?: A Lot 6 Click Score: 17    End of Session Equipment Utilized During Treatment: Gait belt;Rolling walker  OT Visit Diagnosis: Unsteadiness on feet (R26.81);Other abnormalities of gait and mobility (R26.89);Pain Pain - Right/Left: Left Pain - part of body: Knee   Activity Tolerance Patient tolerated treatment well   Patient Left with call bell/phone within reach;with family/visitor present;in bed   Nurse Communication Mobility status;Patient requests pain meds        Time: 2595-6387 OT Time Calculation (min): 39 min  Charges: OT General Charges $OT Visit: 1 Visit OT Treatments $Self  Care/Home Management : 38-52 mins  Minus Breeding, MSOT, OTR/L  Supplemental Rehabilitation Services  [phone]   Marius Ditch 04/13/2018, 3:38 PM

## 2018-04-14 NOTE — Progress Notes (Signed)
Patient ID: Becky Mccall, female   DOB: 06-24-1929, 85 y.o.   MRN: [id number] No acute changes.  Doing well overall.  Can be discharged to skilled nursing today.

## 2018-04-14 NOTE — Progress Notes (Signed)
Physical Therapy Treatment Patient Details Name: Becky Mccall MRN: [id number] DOB: 1929-06-15 Today's Date: 04/14/2018    History of Present Illness 85 y.o. female, has a history of pain and functional disability in the left knee due to arthritis and has failed non-surgical conservative treatments. s/p L TKA 04/11/2018. PMH significant for CAD, TAVR, COPD, vertigo, HTN, Brain aneurysm, CKD stage II    PT Comments    Patient seen for mobility progression. Pt is making progress toward PT goals and reports less pain today. Pt tolerated gait training distance of 70 ft and with improving step through pattern. Current plan remains appropriate.    Follow Up Recommendations  Follow surgeon's recommendation for DC plan and follow-up therapies(plans for [facility] in [place])     Equipment Recommendations  (TBD at next venue)    Recommendations for Other Services       Precautions / Restrictions Precautions Precautions: Fall Precaution Comments: hx of vertigo, no falls currently but dizziness Restrictions Weight Bearing Restrictions: Yes LLE Weight Bearing: Weight bearing as tolerated    Mobility  Bed Mobility Overal bed mobility: Needs Assistance Bed Mobility: Supine to Sit     Supine to sit: Min assist     General bed mobility comments: assist to bring L LE to EOB   Transfers Overall transfer level: Needs assistance Equipment used: Rolling walker (2 wheeled) Transfers: Sit to/from Stand Sit to Stand: Min assist         General transfer comment: assist to power up into standing; cues for safe hand placement  Ambulation/Gait Ambulation/Gait assistance: Min assist Gait Distance (Feet): 70 Feet Assistive device: Rolling walker (2 wheeled) Gait Pattern/deviations: Step-to pattern;Decreased stance time - left;Decreased weight shift to left;Antalgic;Trunk flexed;Step-through pattern;Decreased step length - right Gait velocity: decreased   General Gait Details: cues for  posture, sequencing, and increased R step length   Stairs             Wheelchair Mobility    Modified Rankin (Stroke Patients Only)       Balance Overall balance assessment: Needs assistance Sitting-balance support: Feet supported;No upper extremity supported Sitting balance-Leahy Scale: Good     Standing balance support: During functional activity;Bilateral upper extremity supported Standing balance-Leahy Scale: Poor                              Cognition Arousal/Alertness: Awake/alert Behavior During Therapy: WFL for tasks assessed/performed Overall Cognitive Status: Within Functional Limits for tasks assessed                                 General Comments: HOH and has hearing aid for L ear so speak to her on L side      Exercises Total Joint Exercises Quad Sets: AROM;Strengthening;Left;10 reps Short Arc Quad: AROM;Strengthening;Left;10 reps Heel Slides: AAROM;Strengthening;Left;10 reps    General Comments        Pertinent Vitals/Pain Pain Assessment: Faces Faces Pain Scale: Hurts little more Pain Location: L knee Pain Descriptors / Indicators: Sore;Guarding Pain Intervention(s): Limited activity within patient's tolerance;Monitored during session;Repositioned    Home Living                      Prior Function            PT Goals (current goals can now be found in the care plan section) Progress towards PT goals: Progressing  toward goals    Frequency    7X/week      PT Plan Current plan remains appropriate    Co-evaluation              AM-PAC PT "6 Clicks" Mobility   Outcome Measure  Help needed turning from your back to your side while in a flat bed without using bedrails?: A Little Help needed moving from lying on your back to sitting on the side of a flat bed without using bedrails?: A Little Help needed moving to and from a bed to a chair (including a wheelchair)?: A Little Help needed  standing up from a chair using your arms (e.g., wheelchair or bedside chair)?: A Little Help needed to walk in hospital room?: A Little Help needed climbing 3-5 steps with a railing? : A Lot 6 Click Score: 17    End of Session Equipment Utilized During Treatment: Gait belt Activity Tolerance: Patient tolerated treatment well Patient left: in chair;with call bell/phone within reach;with family/visitor present Nurse Communication: Mobility status PT Visit Diagnosis: Unsteadiness on feet (R26.81);Other abnormalities of gait and mobility (R26.89);Muscle weakness (generalized) (M62.81);Difficulty in walking, not elsewhere classified (R26.2);Dizziness and giddiness (R42);Pain Pain - Right/Left: Left Pain - part of body: Knee     Time: 8099-8338 PT Time Calculation (min) (ACUTE ONLY): 39 min  Charges:  $Gait Training: 23-37 mins $Therapeutic Exercise: 8-22 mins                     Earney Navy, PTA Acute Rehabilitation Services Pager: [phone] Office: [phone]     Darliss Cheney 04/14/2018, 4:26 PM

## 2018-04-14 NOTE — Discharge Summary (Addendum)
Patient ID: Becky Mccall MRN: [id number] DOB/AGE: 01-May-1929 85 y.o.  Admit date: 04/11/2018 Discharge date: 04/15/2018  Admission Diagnoses:  Principal Problem:   Unilateral primary osteoarthritis, left knee Active Problems:   Status post total left knee replacement   Discharge Diagnoses:  Same  Past Medical History:  Diagnosis Date  . Brain aneurysm   . CAD in native artery    a.  [facility] 11/15/16 showing normal LVEDP, mild nonobstructive CAD (30% prox RCA, 40% ostial RPDA, 45% D1, 45% dLAD), severe AS with mean gradient 40.43mmHg, AVA 0.76cm2.   . Carotid stenosis    a. 1-39% by duplex 2018.  Marland Kitchen Cervical spondylolysis   . Chronic lower back pain   . CKD (chronic kidney disease), stage II   . COPD (chronic obstructive pulmonary disease) ([place])    "mild" (12/09/2016)  . DDD (degenerative disc disease), lumbar   . GERD (gastroesophageal reflux disease)   . Hard of hearing    wears hearing aides both ears  . Hyperlipidemia    can't take the meds so takes Fish Oil  . Hypertension   . Hypothyroidism   . IBS (irritable bowel syndrome)   . Intermittent dysphagia    for pills and solids  . Osteopenia   . S/P TAVR (transcatheter aortic valve replacement) 12/07/2016   26 mm Edwards Sapien 3 transcatheter heart valve placed via open left transcarotid approach   . Severe aortic stenosis    a. s/p TAVR 11/2016.  Marland Kitchen Vertigo     Surgeries: Procedure(s): LEFT TOTAL KNEE ARTHROPLASTY on 04/11/2018   Consultants:   Discharged Condition: Improved  Hospital Course: DARIANN HUCKABA is an 85 y.o. female who was admitted 04/11/2018 for operative treatment ofUnilateral primary osteoarthritis, left knee. Patient has severe unremitting pain that affects sleep, daily activities, and work/hobbies. After pre-op clearance the patient was taken to the operating room on 04/11/2018 and underwent  Procedure(s): LEFT TOTAL KNEE ARTHROPLASTY.    Patient was given perioperative antibiotics:   Anti-infectives (From admission, onward)   Start     Dose/Rate Route Frequency Ordered Stop   04/11/18 2030  ceFAZolin (ANCEF) IVPB 1 g/50 mL premix     1 g 100 mL/hr over 30 Minutes Intravenous Every 6 hours 04/11/18 1811 04/12/18 0305   04/11/18 1204  ceFAZolin (ANCEF) 2-4 GM/100ML-% IVPB    Note to Pharmacy:  Therese Sarah   : cabinet override      04/11/18 1204 04/11/18 1423   04/11/18 1200  ceFAZolin (ANCEF) IVPB 2g/100 mL premix     2 g 200 mL/hr over 30 Minutes Intravenous On call to O.R. 04/11/18 1158 04/11/18 1453       Patient was given sequential compression devices, early ambulation, and chemoprophylaxis to prevent DVT.  Patient benefited maximally from hospital stay and there were no complications.    Recent vital signs:  Patient Vitals for the past 24 hrs:  BP Temp Temp src Pulse Resp SpO2  04/15/18 0328 (!) 138/57 98.6 F (37 C) Oral 85 - 93 %  04/14/18 1942 (!) 147/61 97.9 F (36.6 C) Oral 79 - 95 %  04/14/18 1438 (!) 144/51 98.7 F (37.1 C) Oral 86 16 98 %  04/14/18 1216 (!) 152/56 - - - - -  04/14/18 1151 - - - 81 18 94 %     Recent laboratory studies:  No results for input(s): WBC, HGB, HCT, PLT, NA, K, CL, CO2, BUN, CREATININE, GLUCOSE, INR, CALCIUM in the last 72 hours.  Invalid  input(s): PT, 2   Discharge Medications:   Allergies as of 04/15/2018      Reactions   Lisinopril Cough   Relafen [nabumetone] Other (See Comments)   Unsure of exact reaction.   Statins    Muscle pain   Tape    Developed blisters from clear tape used over cath site 10/2016   Welchol [colesevelam] Other (See Comments)   Muscle pain   Zetia [ezetimibe] Other (See Comments)   Muscle pain.   Neosporin [neomycin-bacitracin Zn-polymyx] Other (See Comments)   Cause wound/scratch to worsen   Zinc Oxide Rash      Medication List    STOP taking these medications   aspirin EC 81 MG tablet Replaced by:  aspirin 81 MG chewable tablet     TAKE these medications    amLODipine 2.5 MG tablet Commonly known as:  NORVASC Take 2.5 mg by mouth daily.   aspirin 81 MG chewable tablet Chew 1 tablet (81 mg total) by mouth 2 (two) times daily. Replaces:  aspirin EC 81 MG tablet   CALCIUM 1000 + D PO Take 1 tablet by mouth daily.   Cinnamon 500 MG capsule Take 500 mg by mouth 2 (two) times daily.   doxylamine (Sleep) 25 MG tablet Commonly known as:  UNISOM Take 25 mg by mouth at bedtime.   Fish Oil 1000 MG Caps Take 1,000 mg by mouth 2 (two) times daily.   levothyroxine 137 MCG tablet Commonly known as:  SYNTHROID, LEVOTHROID Take 137 mcg by mouth daily before breakfast.   magnesium oxide 400 MG tablet Commonly known as:  MAG-OX Take 800 mg by mouth at bedtime.   meclizine 12.5 MG tablet Commonly known as:  ANTIVERT Take 1 tablet (12.5 mg total) by mouth 3 (three) times daily as needed for dizziness.   methocarbamol 500 MG tablet Commonly known as:  ROBAXIN Take 1 tablet (500 mg total) by mouth every 6 (six) hours as needed for muscle spasms.   multivitamin with minerals Tabs tablet Take 1 tablet by mouth daily.   oxyCODONE 5 MG immediate release tablet Commonly known as:  Oxy IR/ROXICODONE Take 1-2 tablets (5-10 mg total) by mouth every 4 (four) hours as needed for moderate pain (pain score 4-6).   promethazine 12.5 MG tablet Commonly known as:  PHENERGAN Take 1 tablet (12.5 mg total) by mouth every 6 (six) hours as needed for nausea or vomiting.   SYSTANE OP Place 1 drop into both eyes 2 (two) times daily as needed (dry eyes).   tetrahydrozoline 0.05 % ophthalmic solution Place 1 drop into both eyes.   tiotropium 18 MCG inhalation capsule Commonly known as:  SPIRIVA Place 18 mcg into inhaler and inhale daily.   traMADol 50 MG tablet Commonly known as:  ULTRAM Take 1 tablet (50 mg total) by mouth every 8 (eight) hours as needed (pain not relieved by tylenol).   Turmeric 500 MG Caps Take 500 mg by mouth 2 (two) times  daily.   vitamin B-12 1000 MCG tablet Commonly known as:  CYANOCOBALAMIN Take 1,000 mcg by mouth daily.   vitamin C 500 MG tablet Commonly known as:  ASCORBIC ACID Take 500 mg by mouth daily.   Vitamin D 50 MCG (2000 UT) tablet Take 6,000 Units by mouth daily.   vitamin E 400 UNIT capsule Take 400 Units by mouth 2 (two) times daily.            Durable Medical Equipment  (From admission, onward)  Start     Ordered   04/11/18 1812  DME Walker rolling  Once    Question:  Patient needs a walker to treat with the following condition  Answer:  Status post total left knee replacement   04/11/18 1811   04/11/18 1812  DME 3 n 1  Once     04/11/18 1811          Diagnostic Studies: Dg Knee Left Port  Result Date: 04/11/2018 CLINICAL DATA:  85 year old female post knee replacement. Initial encounter. EXAM: PORTABLE LEFT KNEE - 1-2 VIEW COMPARISON:  02/07/2009 FINDINGS: Post total left knee replacement. Lucency distal femoral shaft may be related to procedure (not a fracture). Radiopaque tibial component does not extend to the posterior aspect of the tibia. Vascular calcifications. IMPRESSION: Post total left knee replacement. Electronically Signed   By: Genia Del M.D.   On: 04/11/2018 17:14    Disposition: Discharge disposition: [facility]    Mcarthur Rossetti, MD Follow up in 2 week(s).   Specialty:  Orthopedic Surgery Contact information: [place] [address] [phone]            Signed: Aundra Dubin 04/15/2018, 7:20 AM

## 2018-04-15 DIAGNOSIS — J441 Chronic obstructive pulmonary disease with (acute) exacerbation: Secondary | ICD-10-CM | POA: Diagnosis not present

## 2018-04-15 DIAGNOSIS — E46 Unspecified protein-calorie malnutrition: Secondary | ICD-10-CM | POA: Diagnosis not present

## 2018-04-15 DIAGNOSIS — I10 Essential (primary) hypertension: Secondary | ICD-10-CM | POA: Diagnosis not present

## 2018-04-15 DIAGNOSIS — M5136 Other intervertebral disc degeneration, lumbar region: Secondary | ICD-10-CM | POA: Diagnosis not present

## 2018-04-15 DIAGNOSIS — I729 Aneurysm of unspecified site: Secondary | ICD-10-CM | POA: Diagnosis not present

## 2018-04-15 DIAGNOSIS — J449 Chronic obstructive pulmonary disease, unspecified: Secondary | ICD-10-CM | POA: Diagnosis not present

## 2018-04-15 DIAGNOSIS — M545 Low back pain: Secondary | ICD-10-CM | POA: Diagnosis not present

## 2018-04-15 DIAGNOSIS — K219 Gastro-esophageal reflux disease without esophagitis: Secondary | ICD-10-CM | POA: Diagnosis not present

## 2018-04-15 DIAGNOSIS — Z471 Aftercare following joint replacement surgery: Secondary | ICD-10-CM | POA: Diagnosis not present

## 2018-04-15 DIAGNOSIS — D649 Anemia, unspecified: Secondary | ICD-10-CM | POA: Diagnosis not present

## 2018-04-15 DIAGNOSIS — I2510 Atherosclerotic heart disease of native coronary artery without angina pectoris: Secondary | ICD-10-CM | POA: Diagnosis not present

## 2018-04-15 DIAGNOSIS — I959 Hypotension, unspecified: Secondary | ICD-10-CM | POA: Diagnosis not present

## 2018-04-15 DIAGNOSIS — M47812 Spondylosis without myelopathy or radiculopathy, cervical region: Secondary | ICD-10-CM | POA: Diagnosis not present

## 2018-04-15 DIAGNOSIS — I6529 Occlusion and stenosis of unspecified carotid artery: Secondary | ICD-10-CM | POA: Diagnosis not present

## 2018-04-15 DIAGNOSIS — N189 Chronic kidney disease, unspecified: Secondary | ICD-10-CM | POA: Diagnosis not present

## 2018-04-15 DIAGNOSIS — Z7401 Bed confinement status: Secondary | ICD-10-CM | POA: Diagnosis not present

## 2018-04-15 DIAGNOSIS — R262 Difficulty in walking, not elsewhere classified: Secondary | ICD-10-CM | POA: Diagnosis not present

## 2018-04-15 DIAGNOSIS — I739 Peripheral vascular disease, unspecified: Secondary | ICD-10-CM | POA: Diagnosis not present

## 2018-04-15 DIAGNOSIS — Z96652 Presence of left artificial knee joint: Secondary | ICD-10-CM | POA: Diagnosis not present

## 2018-04-15 DIAGNOSIS — M2550 Pain in unspecified joint: Secondary | ICD-10-CM | POA: Diagnosis not present

## 2018-04-15 DIAGNOSIS — M1712 Unilateral primary osteoarthritis, left knee: Secondary | ICD-10-CM | POA: Diagnosis not present

## 2018-04-15 DIAGNOSIS — G8918 Other acute postprocedural pain: Secondary | ICD-10-CM | POA: Diagnosis not present

## 2018-04-15 NOTE — Clinical Social Work Note (Addendum)
Clinical Social Worker facilitated patient discharge including contacting patient family and facility to confirm patient discharge plans.  Clinical information faxed to facility and family agreeable with plan.  CSW arranged ambulance transport via [facility] (12:00) to [facility] in [place] (room 701).  RN to call [phone] Ext. [phone] for report prior to discharge.  Clinical Social Worker will sign off for now as social work intervention is no longer needed. Please consult [place] again if new need arises.  [place], [place]

## 2018-04-15 NOTE — Progress Notes (Signed)
Patient refuse CPM this AM. Patient encouraged. Will continue to monitor.

## 2018-04-15 NOTE — Progress Notes (Signed)
Subjective: 4 Days Post-Op Procedure(s) (LRB): LEFT TOTAL KNEE ARTHROPLASTY (Left) Patient reports pain as mild.  Doing ok this am.   Objective: Vital signs in last 24 hours: Temp:  [97.9 F (36.6 C)-98.7 F (37.1 C)] 98.6 F (37 C) (02/22 0328) Pulse Rate:  [79-86] 85 (02/22 0328) Resp:  [16-18] 16 (02/21 1438) BP: (138-152)/(51-61) 138/57 (02/22 0328) SpO2:  [93 %-98 %] 93 % (02/22 0328)  Intake/Output from previous day: 02/21 0701 - 02/22 0700 In: 240 [P.O.:240] Out: 800 [Urine:800] Intake/Output this shift: No intake/output data recorded.  No results for input(s): HGB in the last 72 hours. No results for input(s): WBC, RBC, HCT, PLT in the last 72 hours. No results for input(s): NA, K, CL, CO2, BUN, CREATININE, GLUCOSE, CALCIUM in the last 72 hours. No results for input(s): LABPT, INR in the last 72 hours.  Neurologically intact Neurovascular intact Sensation intact distally Intact pulses distally Dorsiflexion/Plantar flexion intact Incision: dressing C/D/I No cellulitis present Compartment soft   Assessment/Plan: 4 Days Post-Op Procedure(s) (LRB): LEFT TOTAL KNEE ARTHROPLASTY (Left) Advance diet Up with therapy D/C IV fluids Discharge to SNF today [facility]AT LLE F/u with Dr. Ninfa Linden 2 weeks post-op   Anticipated LOS equal to or greater than 2 midnights due to - Age 66 and older with one or more of the following:  - Obesity  - Expected need for hospital services (PT, OT, Nursing) required for safe  discharge  - Anticipated need for postoperative skilled nursing care or inpatient rehab  - Active co-morbidities: Coronary Artery Disease OR   - Unanticipated findings during/Post Surgery: Slow post-op progression: GI, pain control, mobility  - Patient is a high risk of re-admission due to: None    Becky Mccall 04/15/2018, 7:18 AM

## 2018-04-15 NOTE — Progress Notes (Signed)
Physical Therapy Treatment Patient Details Name: Becky Mccall MRN: [id number] DOB: June 10, 1929 Today's Date: 04/15/2018    History of Present Illness 85 y.o. female, has a history of pain and functional disability in the left knee due to arthritis and has failed non-surgical conservative treatments. s/p L TKA 04/11/2018. PMH significant for CAD, TAVR, COPD, vertigo, HTN, Brain aneurysm, CKD stage II    PT Comments    Progressing well towards goals. Reviewed knee precautions/positioning. Son present and helpful with review. Ambulated to and from bathroom with contact guard assist for safety. No buckling noted, cues for walker placement and terminal knee extension with gait symmetry. Reviewed exercises. Transportation arrived towards end of session, assisted with transfer to [place], pt with good control upon descent but unable to flex Lt knee to level of chair at this time.   Follow Up Recommendations  Follow surgeon's recommendation for DC plan and follow-up therapies(plans for [facility] in [place])     Equipment Recommendations  (TBD at next venue)    Recommendations for Other Services       Precautions / Restrictions Precautions Precautions: Fall Precaution Comments: hx of vertigo, no falls currently but dizziness Restrictions Weight Bearing Restrictions: Yes LLE Weight Bearing: Weight bearing as tolerated    Mobility  Bed Mobility               General bed mobility comments: Seated in recliner when PT entered room.  Transfers Overall transfer level: Needs assistance Equipment used: Rolling walker (2 wheeled) Transfers: Sit to/from Stand Sit to Stand: Min assist         General transfer comment: Min assist for boost to stand from Recliner and toilet. VC for hand placement and technique.  Ambulation/Gait Ambulation/Gait assistance: Min assist Gait Distance (Feet): 15 Feet(x2) Assistive device: Rolling walker (2 wheeled) Gait Pattern/deviations: Step-to  pattern;Decreased stance time - left;Decreased weight shift to left;Antalgic;Decreased step length - right Gait velocity: decreased Gait velocity interpretation: <1.31 ft/sec, indicative of household ambulator General Gait Details: Contact guard assist for safety. Ambulates to bathroom without any buckling. VC for heel strike, symmetry of gait, and awareness of Lt knee position.    Stairs             Wheelchair Mobility    Modified Rankin (Stroke Patients Only)       Balance Overall balance assessment: Needs assistance Sitting-balance support: Feet supported;No upper extremity supported Sitting balance-Leahy Scale: Good     Standing balance support: During functional activity;Bilateral upper extremity supported Standing balance-Leahy Scale: Poor                              Cognition Arousal/Alertness: Awake/alert Behavior During Therapy: WFL for tasks assessed/performed Overall Cognitive Status: Within Functional Limits for tasks assessed                                 General Comments: HOH and has hearing aid for L ear so speak to her on L side      Exercises Total Joint Exercises Ankle Circles/Pumps: AROM;Both;10 reps;Seated Quad Sets: Strengthening;Left;5 reps;Seated Short Arc Quad: AROM;Strengthening;Left;5 reps Heel Slides: AAROM;Strengthening;Left;5 reps Long Arc Quad: Strengthening;Left;5 reps;Seated Knee Flexion: Strengthening;Left;5 reps;Seated    General Comments General comments (skin integrity, edema, etc.): Reviewed precautions and positioning following TKA.      Pertinent Vitals/Pain Pain Assessment: Faces Faces Pain Scale: Hurts little more Pain Location:  L knee Pain Descriptors / Indicators: Sore;Guarding Pain Intervention(s): Monitored during session;Repositioned    Home Living                      Prior Function            PT Goals (current goals can now be found in the care plan section) Acute  Rehab PT Goals Patient Stated Goal: have less knee pain PT Goal Formulation: With patient/family Time For Goal Achievement: 04/26/18 Potential to Achieve Goals: Good Progress towards PT goals: Progressing toward goals    Frequency    7X/week      PT Plan Current plan remains appropriate    Co-evaluation              AM-PAC PT "6 Clicks" Mobility   Outcome Measure  Help needed turning from your back to your side while in a flat bed without using bedrails?: A Little Help needed moving from lying on your back to sitting on the side of a flat bed without using bedrails?: A Little Help needed moving to and from a bed to a chair (including a wheelchair)?: A Little Help needed standing up from a chair using your arms (e.g., wheelchair or bedside chair)?: A Little Help needed to walk in hospital room?: A Little Help needed climbing 3-5 steps with a railing? : A Lot 6 Click Score: 17    End of Session Equipment Utilized During Treatment: Gait belt Activity Tolerance: Patient tolerated treatment well Patient left: with family/visitor present(On stretcher for [facility] transportation) Nurse Communication: Mobility status PT Visit Diagnosis: Unsteadiness on feet (R26.81);Other abnormalities of gait and mobility (R26.89);Muscle weakness (generalized) (M62.81);Difficulty in walking, not elsewhere classified (R26.2);Dizziness and giddiness (R42);Pain Pain - Right/Left: Left Pain - part of body: Knee     Time: 1300-1313 PT Time Calculation (min) (ACUTE ONLY): 13 min  Charges:  $Therapeutic Activity: 8-22 mins                     Elayne Snare, PT, DPT   Ellouise Newer 04/15/2018, 1:22 PM

## 2018-04-15 NOTE — Progress Notes (Addendum)
1102: AVS, printed prescriptions, and social worker's paperwork placed in discharge packet. Report called and given to Jamas Lav, RN at [facility]. All questions answered to satisfaction. Awaiting [facility] for transportation. Will continue to monitor.   1303: [facility] at bedside to transport pt.

## 2018-04-15 NOTE — Clinical Social Work Placement (Signed)
   CLINICAL SOCIAL WORK PLACEMENT  NOTE  Date:  04/15/2018  Patient Details  Name: Becky Mccall MRN: [id number] Date of Birth: [birth year]  Clinical Social Work is seeking post-discharge placement for this patient at the [place] level of care (*CSW will initial, date and re-position this form in  chart as items are completed):      Patient/family provided with [place] Work Department's list of facilities offering this level of care within the geographic area requested by the patient (or if unable, by the patient's family).  Yes   Patient/family informed of their freedom to choose among providers that offer the needed level of care, that participate in Medicare, Medicaid or managed care program needed by the patient, have an available bed and are willing to accept the patient.      Patient/family informed of [facility]'s ownership interest in [facility], as well as of the fact that they are under no obligation to receive care at these facilities.  PASRR submitted to EDS on       PASRR number received on 04/13/18     Existing PASRR number confirmed on       FL2 transmitted to all facilities in geographic area requested by pt/family on 04/13/18     FL2 transmitted to all facilities within larger geographic area on       Patient informed that his/her managed care company has contracts with or will negotiate with certain facilities, including the following:        Yes   Patient/family informed of bed offers received.  Patient chooses bed at [facility]     Physician recommends and patient chooses bed at      Patient to be transferred to [facility] on 04/15/18.  Patient to be transferred to facility by [facility]     Patient family notified on 04/15/18 of transfer.  Name of family member notified:  pt alert and oriented     PHYSICIAN       Additional Comment:     _______________________________________________ Eileen Stanford, LCSW 04/15/2018, 8:56 AM

## 2018-04-15 NOTE — Plan of Care (Signed)
  Problem: Education: Goal: Knowledge of General Education information will improve Description: Including pain rating scale, medication(s)/side effects and non-pharmacologic comfort measures Outcome: Progressing   Problem: Health Behavior/Discharge Planning: Goal: Ability to manage health-related needs will improve Outcome: Progressing   Problem: Clinical Measurements: Goal: Respiratory complications will improve Outcome: Progressing Goal: Cardiovascular complication will be avoided Outcome: Progressing   Problem: Activity: Goal: Risk for activity intolerance will decrease Outcome: Progressing   Problem: Nutrition: Goal: Adequate nutrition will be maintained Outcome: Progressing   Problem: Coping: Goal: Level of anxiety will decrease Outcome: Progressing   Problem: Elimination: Goal: Will not experience complications related to bowel motility Outcome: Progressing Goal: Will not experience complications related to urinary retention Outcome: Progressing   Problem: Pain Managment: Goal: General experience of comfort will improve Outcome: Progressing   Problem: Safety: Goal: Ability to remain free from injury will improve Outcome: Progressing   Problem: Skin Integrity: Goal: Risk for impaired skin integrity will decrease Outcome: Progressing   

## 2018-04-18 ENCOUNTER — Telehealth (INDEPENDENT_AMBULATORY_CARE_PROVIDER_SITE_OTHER): Payer: Self-pay | Admitting: Orthopaedic Surgery

## 2018-04-18 DIAGNOSIS — Z96652 Presence of left artificial knee joint: Secondary | ICD-10-CM | POA: Diagnosis not present

## 2018-04-18 DIAGNOSIS — R262 Difficulty in walking, not elsewhere classified: Secondary | ICD-10-CM | POA: Diagnosis not present

## 2018-04-18 DIAGNOSIS — D649 Anemia, unspecified: Secondary | ICD-10-CM | POA: Diagnosis not present

## 2018-04-18 DIAGNOSIS — G8918 Other acute postprocedural pain: Secondary | ICD-10-CM | POA: Diagnosis not present

## 2018-04-18 NOTE — Telephone Encounter (Signed)
No CPM needed

## 2018-04-18 NOTE — Telephone Encounter (Signed)
Please advise. Thanks.  

## 2018-04-18 NOTE — Telephone Encounter (Signed)
Received voicemail message from Becky Mccall with [facility] called left voicemail stating patient had a knee replacement and need to know if patient need the CPM machine or not. The number to contact Janett Billow is [phone]  [id number]

## 2018-04-18 NOTE — Telephone Encounter (Signed)
IC LM for Becky Mccall advising per Artis Delay

## 2018-04-25 ENCOUNTER — Ambulatory Visit (INDEPENDENT_AMBULATORY_CARE_PROVIDER_SITE_OTHER): Payer: Medicare Other | Admitting: Orthopaedic Surgery

## 2018-04-25 ENCOUNTER — Encounter (INDEPENDENT_AMBULATORY_CARE_PROVIDER_SITE_OTHER): Payer: Self-pay | Admitting: Orthopaedic Surgery

## 2018-04-25 DIAGNOSIS — Z96652 Presence of left artificial knee joint: Secondary | ICD-10-CM

## 2018-04-25 NOTE — Progress Notes (Signed)
HPI: Becky Mccall returns today 2-week status post left total knee arthroplasty.  She is overall doing well.  She is at [place] home.  She is overall doing well.  She reports therapy is going well.  Spinal and 81 mg aspirin twice daily she is on 81 mg once daily prior to surgery.  Taking no pain medication occasional Tylenol.   Physical exam: Left knee surgical incisions are healing well well approximated with staples no signs of dehiscence or infection.  Calf supple nontender.  She has full extension and flexion to approximately 105 degrees.  Impression: 2 weeks status post left total hip arthroplasty  Plan: Continue to work on range of motion strengthening gait and balance.  Staples removed Steri-Strips applied scar tissue mobilization encouraged.  Follow-up with [place] in 1 month sooner if there is any questions or concerns.  She will go back to her 81 mg aspirin once daily as she was on prior to surgery.

## 2018-04-29 DIAGNOSIS — N182 Chronic kidney disease, stage 2 (mild): Secondary | ICD-10-CM | POA: Diagnosis not present

## 2018-04-29 DIAGNOSIS — I2510 Atherosclerotic heart disease of native coronary artery without angina pectoris: Secondary | ICD-10-CM | POA: Diagnosis not present

## 2018-04-29 DIAGNOSIS — Z952 Presence of prosthetic heart valve: Secondary | ICD-10-CM | POA: Diagnosis not present

## 2018-04-29 DIAGNOSIS — Z96652 Presence of left artificial knee joint: Secondary | ICD-10-CM | POA: Diagnosis not present

## 2018-04-29 DIAGNOSIS — Z87891 Personal history of nicotine dependence: Secondary | ICD-10-CM | POA: Diagnosis not present

## 2018-04-29 DIAGNOSIS — Z471 Aftercare following joint replacement surgery: Secondary | ICD-10-CM | POA: Diagnosis not present

## 2018-04-29 DIAGNOSIS — Z7982 Long term (current) use of aspirin: Secondary | ICD-10-CM | POA: Diagnosis not present

## 2018-04-29 DIAGNOSIS — I129 Hypertensive chronic kidney disease with stage 1 through stage 4 chronic kidney disease, or unspecified chronic kidney disease: Secondary | ICD-10-CM | POA: Diagnosis not present

## 2018-04-29 DIAGNOSIS — Z79891 Long term (current) use of opiate analgesic: Secondary | ICD-10-CM | POA: Diagnosis not present

## 2018-05-02 ENCOUNTER — Telehealth (INDEPENDENT_AMBULATORY_CARE_PROVIDER_SITE_OTHER): Payer: Self-pay | Admitting: Orthopaedic Surgery

## 2018-05-02 NOTE — Telephone Encounter (Signed)
Becky Mccall-(PT) with [facility] called left voicemail message needing verbal orders for HHPT 2 Wk 2 and 1WK 1 . The  Number to contact Tharon Aquas is [phone]

## 2018-05-02 NOTE — Telephone Encounter (Signed)
Verbal order left on VM  

## 2018-05-03 DIAGNOSIS — Z0000 Encounter for general adult medical examination without abnormal findings: Secondary | ICD-10-CM | POA: Diagnosis not present

## 2018-05-03 DIAGNOSIS — I129 Hypertensive chronic kidney disease with stage 1 through stage 4 chronic kidney disease, or unspecified chronic kidney disease: Secondary | ICD-10-CM | POA: Diagnosis not present

## 2018-05-03 DIAGNOSIS — E785 Hyperlipidemia, unspecified: Secondary | ICD-10-CM | POA: Diagnosis not present

## 2018-05-03 DIAGNOSIS — N183 Chronic kidney disease, stage 3 (moderate): Secondary | ICD-10-CM | POA: Diagnosis not present

## 2018-05-03 NOTE — Telephone Encounter (Signed)
Ok for order?  

## 2018-05-03 NOTE — Telephone Encounter (Signed)
That will be fine. 

## 2018-05-03 NOTE — Telephone Encounter (Signed)
New Message  Levada Dy from [facility] verbalized pt needing verbal orders for nursing for 1 week for 2 weeks then once every other week for 7 weeks. Verbal orders are for disease process management and med management.

## 2018-05-03 NOTE — Telephone Encounter (Signed)
IC no answer. LMVM giving verbal.

## 2018-05-23 ENCOUNTER — Ambulatory Visit (INDEPENDENT_AMBULATORY_CARE_PROVIDER_SITE_OTHER): Payer: Medicare Other | Admitting: Orthopaedic Surgery

## 2018-06-08 DIAGNOSIS — Z79891 Long term (current) use of opiate analgesic: Secondary | ICD-10-CM | POA: Diagnosis not present

## 2018-06-08 DIAGNOSIS — Z7982 Long term (current) use of aspirin: Secondary | ICD-10-CM | POA: Diagnosis not present

## 2018-06-08 DIAGNOSIS — I2510 Atherosclerotic heart disease of native coronary artery without angina pectoris: Secondary | ICD-10-CM | POA: Diagnosis not present

## 2018-06-08 DIAGNOSIS — Z471 Aftercare following joint replacement surgery: Secondary | ICD-10-CM | POA: Diagnosis not present

## 2018-06-08 DIAGNOSIS — Z87891 Personal history of nicotine dependence: Secondary | ICD-10-CM | POA: Diagnosis not present

## 2018-06-08 DIAGNOSIS — Z96652 Presence of left artificial knee joint: Secondary | ICD-10-CM | POA: Diagnosis not present

## 2018-06-08 DIAGNOSIS — Z952 Presence of prosthetic heart valve: Secondary | ICD-10-CM | POA: Diagnosis not present

## 2018-06-08 DIAGNOSIS — I129 Hypertensive chronic kidney disease with stage 1 through stage 4 chronic kidney disease, or unspecified chronic kidney disease: Secondary | ICD-10-CM | POA: Diagnosis not present

## 2018-06-08 DIAGNOSIS — N182 Chronic kidney disease, stage 2 (mild): Secondary | ICD-10-CM | POA: Diagnosis not present

## 2018-06-14 ENCOUNTER — Telehealth (INDEPENDENT_AMBULATORY_CARE_PROVIDER_SITE_OTHER): Payer: Self-pay | Admitting: Orthopaedic Surgery

## 2018-06-14 NOTE — Telephone Encounter (Signed)
Becky Mccall lmom stating that her mom [name] incision site is reg, swollen and patient thinks that there may be a staple left inside. Please call  Becky Mccall @ [phone]

## 2018-06-14 NOTE — Telephone Encounter (Signed)
Daughter will email me a picture

## 2018-06-20 NOTE — Telephone Encounter (Signed)
Patient daughter said she went to look at wound and it looked fine

## 2018-08-28 DIAGNOSIS — H04123 Dry eye syndrome of bilateral lacrimal glands: Secondary | ICD-10-CM | POA: Diagnosis not present

## 2018-08-28 DIAGNOSIS — H43393 Other vitreous opacities, bilateral: Secondary | ICD-10-CM | POA: Diagnosis not present

## 2018-09-12 DIAGNOSIS — H6121 Impacted cerumen, right ear: Secondary | ICD-10-CM | POA: Diagnosis not present

## 2018-09-20 DIAGNOSIS — I872 Venous insufficiency (chronic) (peripheral): Secondary | ICD-10-CM | POA: Diagnosis not present

## 2018-09-20 DIAGNOSIS — K219 Gastro-esophageal reflux disease without esophagitis: Secondary | ICD-10-CM | POA: Diagnosis not present

## 2018-09-20 DIAGNOSIS — H8110 Benign paroxysmal vertigo, unspecified ear: Secondary | ICD-10-CM | POA: Diagnosis not present

## 2018-09-20 DIAGNOSIS — S0991XA Unspecified injury of ear, initial encounter: Secondary | ICD-10-CM | POA: Diagnosis not present

## 2018-11-02 DIAGNOSIS — N183 Chronic kidney disease, stage 3 (moderate): Secondary | ICD-10-CM | POA: Diagnosis not present

## 2018-11-02 DIAGNOSIS — I129 Hypertensive chronic kidney disease with stage 1 through stage 4 chronic kidney disease, or unspecified chronic kidney disease: Secondary | ICD-10-CM | POA: Diagnosis not present

## 2018-11-02 DIAGNOSIS — E785 Hyperlipidemia, unspecified: Secondary | ICD-10-CM | POA: Diagnosis not present

## 2018-11-02 DIAGNOSIS — E039 Hypothyroidism, unspecified: Secondary | ICD-10-CM | POA: Diagnosis not present

## 2018-11-03 DIAGNOSIS — E559 Vitamin D deficiency, unspecified: Secondary | ICD-10-CM | POA: Diagnosis not present

## 2018-11-03 DIAGNOSIS — E039 Hypothyroidism, unspecified: Secondary | ICD-10-CM | POA: Diagnosis not present

## 2018-11-03 DIAGNOSIS — E782 Mixed hyperlipidemia: Secondary | ICD-10-CM | POA: Diagnosis not present

## 2018-11-03 DIAGNOSIS — I129 Hypertensive chronic kidney disease with stage 1 through stage 4 chronic kidney disease, or unspecified chronic kidney disease: Secondary | ICD-10-CM | POA: Diagnosis not present

## 2018-11-20 ENCOUNTER — Other Ambulatory Visit: Payer: Self-pay

## 2018-11-20 DIAGNOSIS — Z20822 Contact with and (suspected) exposure to covid-19: Secondary | ICD-10-CM

## 2018-11-21 LAB — NOVEL CORONAVIRUS, NAA: SARS-CoV-2, NAA: NOT DETECTED

## 2018-12-04 DIAGNOSIS — H6123 Impacted cerumen, bilateral: Secondary | ICD-10-CM | POA: Diagnosis not present

## 2018-12-04 DIAGNOSIS — H903 Sensorineural hearing loss, bilateral: Secondary | ICD-10-CM | POA: Diagnosis not present

## 2018-12-24 IMAGING — CT CT HEART MORP W/ CTA COR W/ SCORE W/ CA W/CM &/OR W/O CM
2 of 6 series · 12 of 20 positions shown, 14 images · IV contrast (APPLIED)
Comparison: None.

CLINICAL DATA: Aortic stenosis

EXAM:
Cardiac TAVR CT
TECHNIQUE: The patient was scanned on a Siemens 192 scanner. A 120 kV
retrospective scan was triggered in the ascending thoracic aorta at
140 HU's. Gantry rotation speed was 250 msecs and collimation was .6
mm. No beta blockade or nitro were given. The 3D data set was
reconstructed in 5% intervals of the R-R cycle. Systolic and
diastolic phases were analyzed on a dedicated work station using
MPR, MIP and VRT modes. The patient received 80 cc of contrast.

[Series 7: 0-90% · axial · 0.39mm/px · z∈[+1150,+1316]mm · 6 of 3890 slices shown, 8 images]
[im 556/3890  vessel]
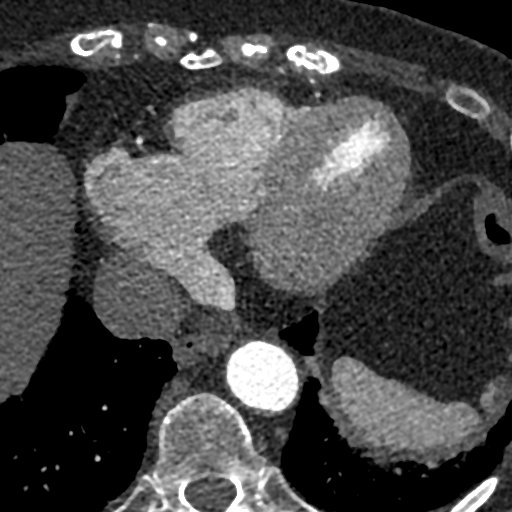
[im 556/3890  lung]
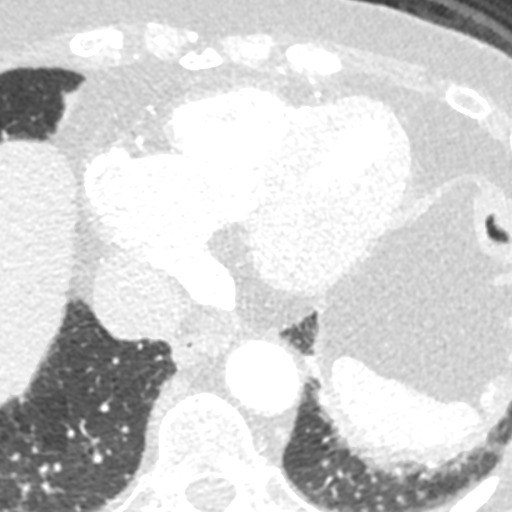
[im 1112/3890  vessel]
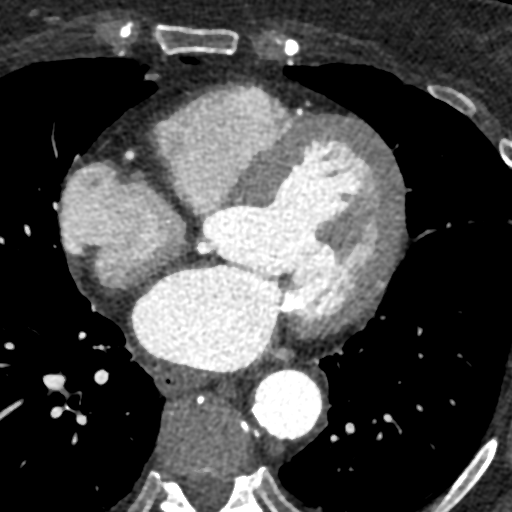
[im 1667/3890  vessel]
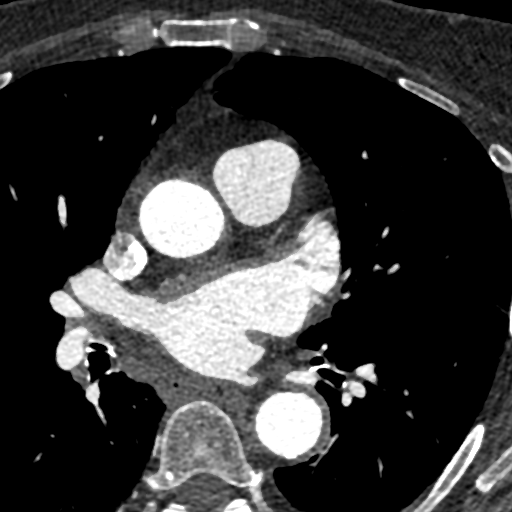
[im 2223/3890  vessel]
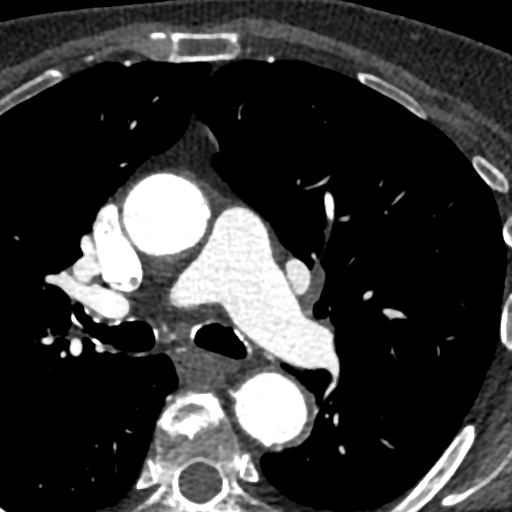
[im 2778/3890  vessel]
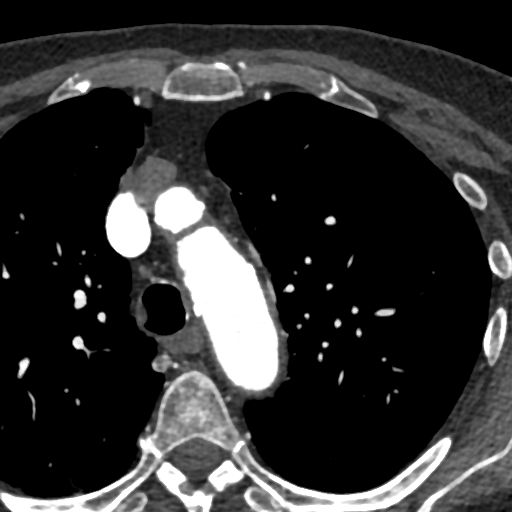
[im 2778/3890  lung]
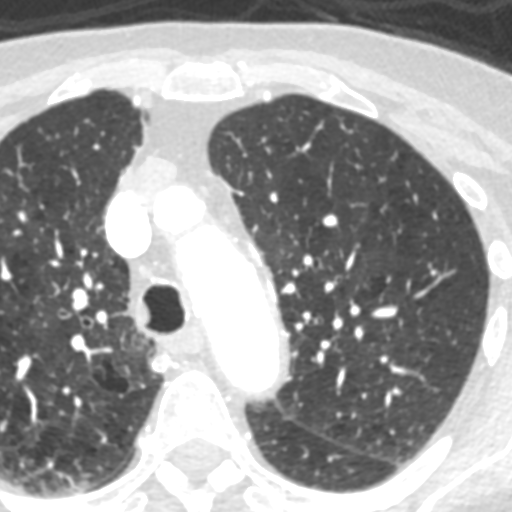
[im 3334/3890  vessel]
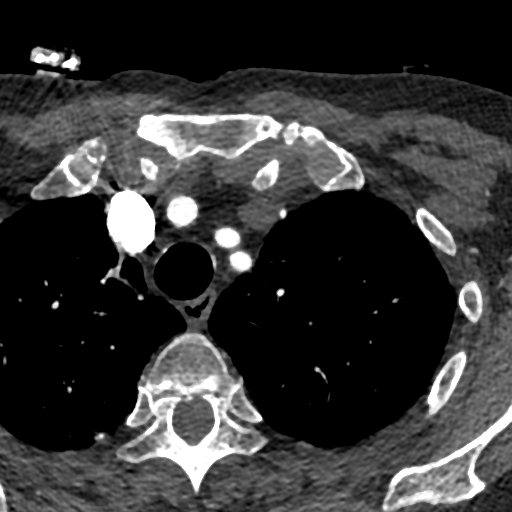

[Series 8: 5-95% · axial · 0.39mm/px · z∈[+1150,+1316]mm · 6 of 3890 slices shown]
[im 556/3890  vessel]
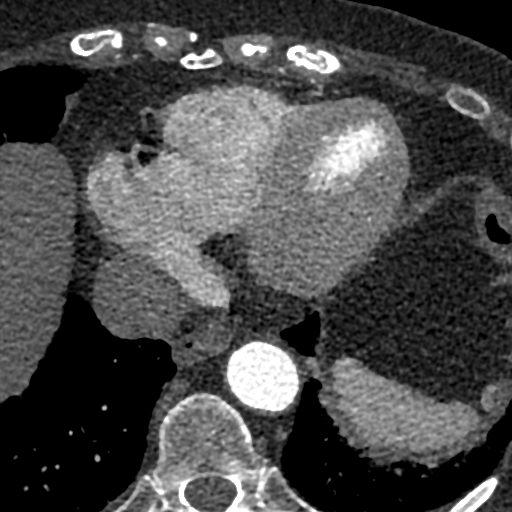
[im 1112/3890  vessel]
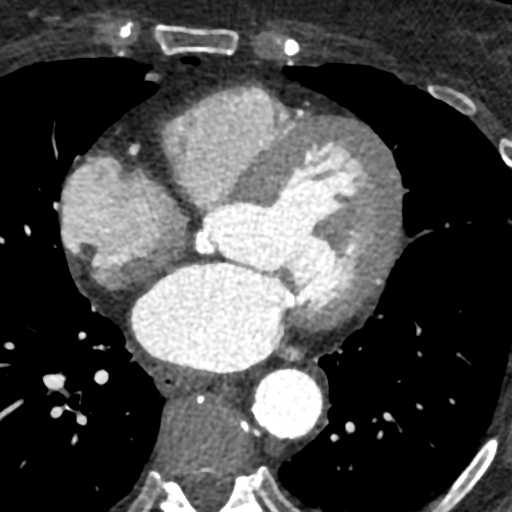
[im 1667/3890  vessel]
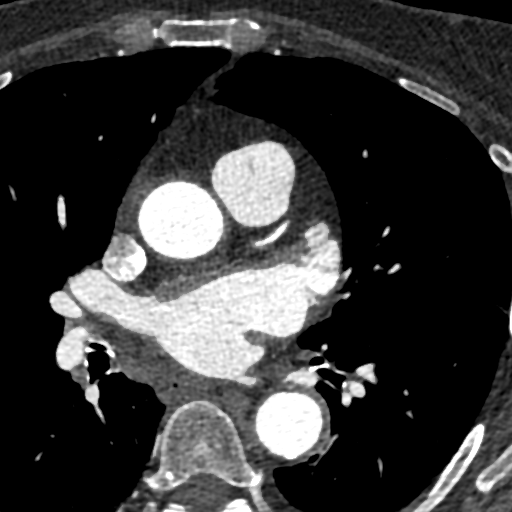
[im 2223/3890  vessel]
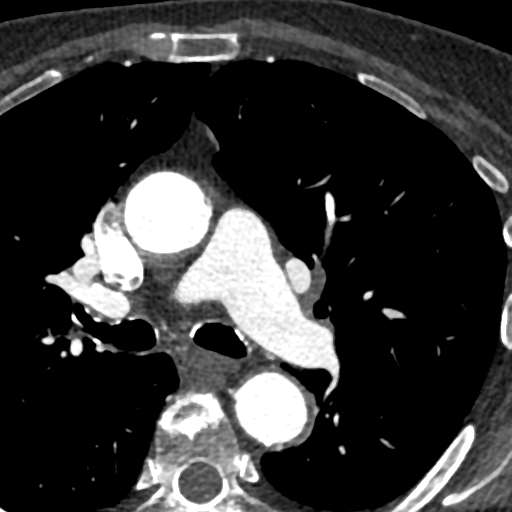
[im 2778/3890  vessel]
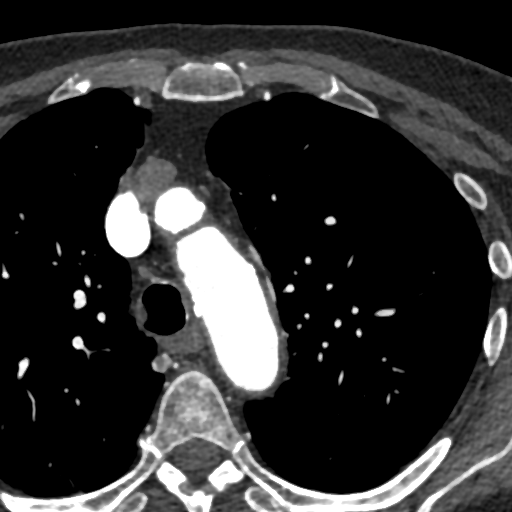
[im 3334/3890  vessel]
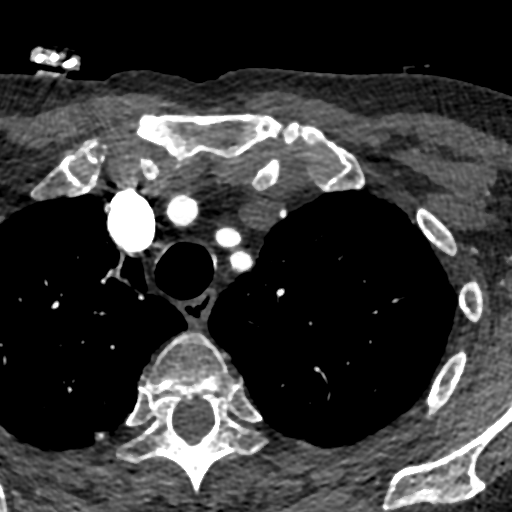

[12 of 20 positions shown; findings below may reference images not displayed]

FINDINGS: Aortic Valve: Tri leaflet and calcified with restricted leaflet
motion

Aorta: Marked mixed and calcific atherosclerosis of the entire
aortic root, arch and descending thoracic aorta normal arch vessel
origins

Sinotubular Junction:  26 mm

Ascending Thoracic Aorta:  30 mm

Aortic Arch:  27 mm

Descending Thoracic Aorta:  27 mm

Sinus of Valsalva Measurements:

Non-coronary:  31 mm

Right - coronary:  29.5 mm

Left - coronary:  30 mm

Coronary Artery Height above Annulus:

Left Main:  12.4 mm

Right Coronary:  14.6 mm

Virtual Basal Annulus Measurements:

Maximum/Minimum Diameter:  28.3 mm x 22.2 mm

Perimeter:  81.4 mm

Area:  491.4 mm2

Coronary Arteries:  Sufficient  height above annulus for deployment

Optimum Fluoroscopic Angle for Delivery: LAO 15 degrees Caudal 6
degrees
IMPRESSION: 1) Calcified tri leaflet aortic valve with annular area 491 mm2 and
perimeter 81.4 mm suitable for a 26 mm Sapien 3 valve or a 29 mm
Evolut Pro valve

2) Optimum angle for deployment LAO 15 degrees Caudal 6 degrees

3) Coronary arteries sufficient height above annulus for deployment

4) No MONZ thrombus

5) STJ calcified around approximately 30% of its circumference

6) Fairly marked calcific atherosclerosis of the entire aortic root,
arch and descending thoracic aorta

Sadi Asumani [place]urama

EXAM:
OVER-READ INTERPRETATION  CT CHEST

The following report is an over-read performed by radiologist Dr.
over-read does not include interpretation of cardiac or coronary
anatomy or pathology. The coronary calcium score/coronary CTA
interpretation by the cardiologist is attached.
FINDINGS: Relevant extracardiac findings will be described separately under
dictation for contemporaneously obtained CTA of the chest, abdomen
and pelvis.
IMPRESSION: Please see separate dictation for contemporaneously obtained CTA of
the chest, abdomen and pelvis 11/24/2016 for full description of
relevant extracardiac findings.

## 2019-03-12 DIAGNOSIS — I129 Hypertensive chronic kidney disease with stage 1 through stage 4 chronic kidney disease, or unspecified chronic kidney disease: Secondary | ICD-10-CM | POA: Diagnosis not present

## 2019-03-12 DIAGNOSIS — N1831 Chronic kidney disease, stage 3a: Secondary | ICD-10-CM | POA: Diagnosis not present

## 2019-03-21 DIAGNOSIS — M170 Bilateral primary osteoarthritis of knee: Secondary | ICD-10-CM | POA: Diagnosis not present

## 2019-03-21 DIAGNOSIS — I10 Essential (primary) hypertension: Secondary | ICD-10-CM | POA: Diagnosis not present

## 2019-03-21 DIAGNOSIS — N183 Chronic kidney disease, stage 3 unspecified: Secondary | ICD-10-CM | POA: Diagnosis not present

## 2019-03-21 DIAGNOSIS — E039 Hypothyroidism, unspecified: Secondary | ICD-10-CM | POA: Diagnosis not present

## 2019-04-02 DIAGNOSIS — E785 Hyperlipidemia, unspecified: Secondary | ICD-10-CM | POA: Diagnosis not present

## 2019-04-02 DIAGNOSIS — I10 Essential (primary) hypertension: Secondary | ICD-10-CM | POA: Diagnosis not present

## 2019-04-02 DIAGNOSIS — E039 Hypothyroidism, unspecified: Secondary | ICD-10-CM | POA: Diagnosis not present

## 2019-04-02 DIAGNOSIS — N183 Chronic kidney disease, stage 3 unspecified: Secondary | ICD-10-CM | POA: Diagnosis not present

## 2019-04-20 DIAGNOSIS — I129 Hypertensive chronic kidney disease with stage 1 through stage 4 chronic kidney disease, or unspecified chronic kidney disease: Secondary | ICD-10-CM | POA: Diagnosis not present

## 2019-05-09 DIAGNOSIS — Z1389 Encounter for screening for other disorder: Secondary | ICD-10-CM | POA: Diagnosis not present

## 2019-05-09 DIAGNOSIS — Z0000 Encounter for general adult medical examination without abnormal findings: Secondary | ICD-10-CM | POA: Diagnosis not present

## 2019-05-09 DIAGNOSIS — N183 Chronic kidney disease, stage 3 unspecified: Secondary | ICD-10-CM | POA: Diagnosis not present

## 2019-05-09 DIAGNOSIS — I129 Hypertensive chronic kidney disease with stage 1 through stage 4 chronic kidney disease, or unspecified chronic kidney disease: Secondary | ICD-10-CM | POA: Diagnosis not present

## 2019-05-10 DIAGNOSIS — M170 Bilateral primary osteoarthritis of knee: Secondary | ICD-10-CM | POA: Diagnosis not present

## 2019-05-10 DIAGNOSIS — E785 Hyperlipidemia, unspecified: Secondary | ICD-10-CM | POA: Diagnosis not present

## 2019-05-10 DIAGNOSIS — I129 Hypertensive chronic kidney disease with stage 1 through stage 4 chronic kidney disease, or unspecified chronic kidney disease: Secondary | ICD-10-CM | POA: Diagnosis not present

## 2019-05-10 DIAGNOSIS — E039 Hypothyroidism, unspecified: Secondary | ICD-10-CM | POA: Diagnosis not present

## 2019-05-23 DIAGNOSIS — I129 Hypertensive chronic kidney disease with stage 1 through stage 4 chronic kidney disease, or unspecified chronic kidney disease: Secondary | ICD-10-CM | POA: Diagnosis not present

## 2019-06-21 DIAGNOSIS — I129 Hypertensive chronic kidney disease with stage 1 through stage 4 chronic kidney disease, or unspecified chronic kidney disease: Secondary | ICD-10-CM | POA: Diagnosis not present

## 2019-07-18 DIAGNOSIS — I129 Hypertensive chronic kidney disease with stage 1 through stage 4 chronic kidney disease, or unspecified chronic kidney disease: Secondary | ICD-10-CM | POA: Diagnosis not present

## 2019-08-20 DIAGNOSIS — I129 Hypertensive chronic kidney disease with stage 1 through stage 4 chronic kidney disease, or unspecified chronic kidney disease: Secondary | ICD-10-CM | POA: Diagnosis not present

## 2019-08-22 DIAGNOSIS — N183 Chronic kidney disease, stage 3 unspecified: Secondary | ICD-10-CM | POA: Diagnosis not present

## 2019-08-22 DIAGNOSIS — E039 Hypothyroidism, unspecified: Secondary | ICD-10-CM | POA: Diagnosis not present

## 2019-08-22 DIAGNOSIS — E785 Hyperlipidemia, unspecified: Secondary | ICD-10-CM | POA: Diagnosis not present

## 2019-08-22 DIAGNOSIS — I129 Hypertensive chronic kidney disease with stage 1 through stage 4 chronic kidney disease, or unspecified chronic kidney disease: Secondary | ICD-10-CM | POA: Diagnosis not present

## 2019-09-10 DIAGNOSIS — E785 Hyperlipidemia, unspecified: Secondary | ICD-10-CM | POA: Diagnosis not present

## 2019-09-10 DIAGNOSIS — I129 Hypertensive chronic kidney disease with stage 1 through stage 4 chronic kidney disease, or unspecified chronic kidney disease: Secondary | ICD-10-CM | POA: Diagnosis not present

## 2019-09-10 DIAGNOSIS — N183 Chronic kidney disease, stage 3 unspecified: Secondary | ICD-10-CM | POA: Diagnosis not present

## 2019-09-10 DIAGNOSIS — E039 Hypothyroidism, unspecified: Secondary | ICD-10-CM | POA: Diagnosis not present

## 2019-11-09 DIAGNOSIS — I129 Hypertensive chronic kidney disease with stage 1 through stage 4 chronic kidney disease, or unspecified chronic kidney disease: Secondary | ICD-10-CM | POA: Diagnosis not present

## 2019-11-09 DIAGNOSIS — N183 Chronic kidney disease, stage 3 unspecified: Secondary | ICD-10-CM | POA: Diagnosis not present

## 2019-11-09 DIAGNOSIS — R42 Dizziness and giddiness: Secondary | ICD-10-CM | POA: Diagnosis not present

## 2019-11-09 DIAGNOSIS — Z23 Encounter for immunization: Secondary | ICD-10-CM | POA: Diagnosis not present

## 2019-11-15 DIAGNOSIS — E039 Hypothyroidism, unspecified: Secondary | ICD-10-CM | POA: Diagnosis not present

## 2019-11-15 DIAGNOSIS — N183 Chronic kidney disease, stage 3 unspecified: Secondary | ICD-10-CM | POA: Diagnosis not present

## 2019-11-15 DIAGNOSIS — I129 Hypertensive chronic kidney disease with stage 1 through stage 4 chronic kidney disease, or unspecified chronic kidney disease: Secondary | ICD-10-CM | POA: Diagnosis not present

## 2019-11-15 DIAGNOSIS — E785 Hyperlipidemia, unspecified: Secondary | ICD-10-CM | POA: Diagnosis not present

## 2019-11-27 DIAGNOSIS — H43393 Other vitreous opacities, bilateral: Secondary | ICD-10-CM | POA: Diagnosis not present

## 2019-11-27 DIAGNOSIS — H16223 Keratoconjunctivitis sicca, not specified as Sjogren's, bilateral: Secondary | ICD-10-CM | POA: Diagnosis not present

## 2019-11-27 DIAGNOSIS — H04123 Dry eye syndrome of bilateral lacrimal glands: Secondary | ICD-10-CM | POA: Diagnosis not present

## 2019-11-27 DIAGNOSIS — H35352 Cystoid macular degeneration, left eye: Secondary | ICD-10-CM | POA: Diagnosis not present

## 2019-12-31 DIAGNOSIS — L249 Irritant contact dermatitis, unspecified cause: Secondary | ICD-10-CM | POA: Diagnosis not present

## 2019-12-31 DIAGNOSIS — L819 Disorder of pigmentation, unspecified: Secondary | ICD-10-CM | POA: Diagnosis not present

## 2019-12-31 DIAGNOSIS — L821 Other seborrheic keratosis: Secondary | ICD-10-CM | POA: Diagnosis not present

## 2019-12-31 DIAGNOSIS — D229 Melanocytic nevi, unspecified: Secondary | ICD-10-CM | POA: Diagnosis not present

## 2020-01-07 DIAGNOSIS — M4696 Unspecified inflammatory spondylopathy, lumbar region: Secondary | ICD-10-CM | POA: Diagnosis not present

## 2020-01-16 ENCOUNTER — Institutional Professional Consult (permissible substitution): Payer: Medicare Other | Admitting: Neurology

## 2020-02-04 DIAGNOSIS — M47816 Spondylosis without myelopathy or radiculopathy, lumbar region: Secondary | ICD-10-CM | POA: Diagnosis not present

## 2020-03-05 DIAGNOSIS — M47816 Spondylosis without myelopathy or radiculopathy, lumbar region: Secondary | ICD-10-CM | POA: Diagnosis not present

## 2020-04-03 DIAGNOSIS — S81801A Unspecified open wound, right lower leg, initial encounter: Secondary | ICD-10-CM | POA: Diagnosis not present

## 2020-04-03 DIAGNOSIS — L03115 Cellulitis of right lower limb: Secondary | ICD-10-CM | POA: Diagnosis not present

## 2020-04-05 DIAGNOSIS — M47892 Other spondylosis, cervical region: Secondary | ICD-10-CM | POA: Diagnosis not present

## 2020-04-05 DIAGNOSIS — J449 Chronic obstructive pulmonary disease, unspecified: Secondary | ICD-10-CM | POA: Diagnosis not present

## 2020-04-05 DIAGNOSIS — K219 Gastro-esophageal reflux disease without esophagitis: Secondary | ICD-10-CM | POA: Diagnosis not present

## 2020-04-05 DIAGNOSIS — K589 Irritable bowel syndrome without diarrhea: Secondary | ICD-10-CM | POA: Diagnosis not present

## 2020-04-05 DIAGNOSIS — M5136 Other intervertebral disc degeneration, lumbar region: Secondary | ICD-10-CM | POA: Diagnosis not present

## 2020-04-05 DIAGNOSIS — E039 Hypothyroidism, unspecified: Secondary | ICD-10-CM | POA: Diagnosis not present

## 2020-04-05 DIAGNOSIS — S81811D Laceration without foreign body, right lower leg, subsequent encounter: Secondary | ICD-10-CM | POA: Diagnosis not present

## 2020-04-05 DIAGNOSIS — K449 Diaphragmatic hernia without obstruction or gangrene: Secondary | ICD-10-CM | POA: Diagnosis not present

## 2020-04-05 DIAGNOSIS — Z8673 Personal history of transient ischemic attack (TIA), and cerebral infarction without residual deficits: Secondary | ICD-10-CM | POA: Diagnosis not present

## 2020-04-05 DIAGNOSIS — M150 Primary generalized (osteo)arthritis: Secondary | ICD-10-CM | POA: Diagnosis not present

## 2020-04-05 DIAGNOSIS — E559 Vitamin D deficiency, unspecified: Secondary | ICD-10-CM | POA: Diagnosis not present

## 2020-04-05 DIAGNOSIS — E7800 Pure hypercholesterolemia, unspecified: Secondary | ICD-10-CM | POA: Diagnosis not present

## 2020-04-05 DIAGNOSIS — I350 Nonrheumatic aortic (valve) stenosis: Secondary | ICD-10-CM | POA: Diagnosis not present

## 2020-04-05 DIAGNOSIS — N6019 Diffuse cystic mastopathy of unspecified breast: Secondary | ICD-10-CM | POA: Diagnosis not present

## 2020-04-05 DIAGNOSIS — G4730 Sleep apnea, unspecified: Secondary | ICD-10-CM | POA: Diagnosis not present

## 2020-04-05 DIAGNOSIS — L03115 Cellulitis of right lower limb: Secondary | ICD-10-CM | POA: Diagnosis not present

## 2020-04-18 DIAGNOSIS — R42 Dizziness and giddiness: Secondary | ICD-10-CM | POA: Diagnosis not present

## 2020-04-18 DIAGNOSIS — L03115 Cellulitis of right lower limb: Secondary | ICD-10-CM | POA: Diagnosis not present

## 2020-04-18 DIAGNOSIS — L97911 Non-pressure chronic ulcer of unspecified part of right lower leg limited to breakdown of skin: Secondary | ICD-10-CM | POA: Diagnosis not present

## 2020-05-06 ENCOUNTER — Other Ambulatory Visit: Payer: Self-pay

## 2020-05-06 ENCOUNTER — Encounter: Payer: Medicare Other | Admitting: Physician Assistant

## 2020-05-12 DIAGNOSIS — I129 Hypertensive chronic kidney disease with stage 1 through stage 4 chronic kidney disease, or unspecified chronic kidney disease: Secondary | ICD-10-CM | POA: Diagnosis not present

## 2020-05-12 DIAGNOSIS — N183 Chronic kidney disease, stage 3 unspecified: Secondary | ICD-10-CM | POA: Diagnosis not present

## 2020-05-12 DIAGNOSIS — Z0000 Encounter for general adult medical examination without abnormal findings: Secondary | ICD-10-CM | POA: Diagnosis not present

## 2020-05-12 DIAGNOSIS — E785 Hyperlipidemia, unspecified: Secondary | ICD-10-CM | POA: Diagnosis not present

## 2020-05-14 ENCOUNTER — Telehealth: Payer: Self-pay | Admitting: Cardiology

## 2020-05-14 NOTE — Telephone Encounter (Signed)
I would prefer she come fasting in case we need to do any other lab work out of her usual

## 2020-05-14 NOTE — Telephone Encounter (Signed)
Spoke with the patient's daughter who is wondering if they could bring lab orders from her PCP to our office to have labs drawn. She states that her mother hard to stick and our office has been good at getting her blood in the past.  Patient is scheduled for an appointment with Dr. [name] on 4/28 and I advised her that we could have blood work done at that time. She states that her daughter does not want to see the doctor and that she just wants lab work done. I advised her that we do not typically accept lab orders from other provider and since patient has not been seen since 2019 she needs to keep her appointment and Dr. [name].  Daughter states that they will try and go somewhere else for lab work and she would like to cancel the appointment with Dr. [name].

## 2020-05-14 NOTE — Telephone Encounter (Signed)
Patient's daughter is requesting orders for the patient to have lab work prior to 06/19/20 appointment with Dr. [name].

## 2020-05-22 DIAGNOSIS — E559 Vitamin D deficiency, unspecified: Secondary | ICD-10-CM | POA: Diagnosis not present

## 2020-05-22 DIAGNOSIS — E785 Hyperlipidemia, unspecified: Secondary | ICD-10-CM | POA: Diagnosis not present

## 2020-05-22 DIAGNOSIS — E039 Hypothyroidism, unspecified: Secondary | ICD-10-CM | POA: Diagnosis not present

## 2020-05-22 DIAGNOSIS — I129 Hypertensive chronic kidney disease with stage 1 through stage 4 chronic kidney disease, or unspecified chronic kidney disease: Secondary | ICD-10-CM | POA: Diagnosis not present

## 2020-05-27 DIAGNOSIS — H532 Diplopia: Secondary | ICD-10-CM | POA: Diagnosis not present

## 2020-05-27 DIAGNOSIS — H02843 Edema of right eye, unspecified eyelid: Secondary | ICD-10-CM | POA: Diagnosis not present

## 2020-05-27 DIAGNOSIS — R42 Dizziness and giddiness: Secondary | ICD-10-CM | POA: Diagnosis not present

## 2020-05-27 DIAGNOSIS — H02846 Edema of left eye, unspecified eyelid: Secondary | ICD-10-CM | POA: Diagnosis not present

## 2020-05-29 DIAGNOSIS — I8312 Varicose veins of left lower extremity with inflammation: Secondary | ICD-10-CM | POA: Diagnosis not present

## 2020-05-29 DIAGNOSIS — L814 Other melanin hyperpigmentation: Secondary | ICD-10-CM | POA: Diagnosis not present

## 2020-05-29 DIAGNOSIS — L570 Actinic keratosis: Secondary | ICD-10-CM | POA: Diagnosis not present

## 2020-05-29 DIAGNOSIS — L821 Other seborrheic keratosis: Secondary | ICD-10-CM | POA: Diagnosis not present

## 2020-06-02 DIAGNOSIS — M79671 Pain in right foot: Secondary | ICD-10-CM | POA: Diagnosis not present

## 2020-06-02 DIAGNOSIS — B351 Tinea unguium: Secondary | ICD-10-CM | POA: Diagnosis not present

## 2020-06-02 DIAGNOSIS — M79672 Pain in left foot: Secondary | ICD-10-CM | POA: Diagnosis not present

## 2020-06-02 DIAGNOSIS — L600 Ingrowing nail: Secondary | ICD-10-CM | POA: Diagnosis not present

## 2020-06-19 ENCOUNTER — Ambulatory Visit: Payer: Medicare Other | Admitting: Cardiology

## 2020-06-19 DIAGNOSIS — N183 Chronic kidney disease, stage 3 unspecified: Secondary | ICD-10-CM | POA: Diagnosis not present

## 2020-06-19 DIAGNOSIS — I129 Hypertensive chronic kidney disease with stage 1 through stage 4 chronic kidney disease, or unspecified chronic kidney disease: Secondary | ICD-10-CM | POA: Diagnosis not present

## 2020-06-19 DIAGNOSIS — E785 Hyperlipidemia, unspecified: Secondary | ICD-10-CM | POA: Diagnosis not present

## 2020-06-19 DIAGNOSIS — E039 Hypothyroidism, unspecified: Secondary | ICD-10-CM | POA: Diagnosis not present

## 2020-07-04 DIAGNOSIS — M51369 Other intervertebral disc degeneration, lumbar region without mention of lumbar back pain or lower extremity pain: Secondary | ICD-10-CM | POA: Insufficient documentation

## 2020-07-04 DIAGNOSIS — G4733 Obstructive sleep apnea (adult) (pediatric): Secondary | ICD-10-CM | POA: Insufficient documentation

## 2020-07-04 DIAGNOSIS — M5136 Other intervertebral disc degeneration, lumbar region: Secondary | ICD-10-CM | POA: Insufficient documentation

## 2020-07-09 DIAGNOSIS — H8190 Unspecified disorder of vestibular function, unspecified ear: Secondary | ICD-10-CM | POA: Diagnosis not present

## 2020-07-09 DIAGNOSIS — G609 Hereditary and idiopathic neuropathy, unspecified: Secondary | ICD-10-CM | POA: Diagnosis not present

## 2020-07-09 DIAGNOSIS — R42 Dizziness and giddiness: Secondary | ICD-10-CM | POA: Diagnosis not present

## 2020-07-16 DIAGNOSIS — F419 Anxiety disorder, unspecified: Secondary | ICD-10-CM | POA: Diagnosis not present

## 2020-07-16 DIAGNOSIS — E039 Hypothyroidism, unspecified: Secondary | ICD-10-CM | POA: Diagnosis not present

## 2020-07-16 DIAGNOSIS — M170 Bilateral primary osteoarthritis of knee: Secondary | ICD-10-CM | POA: Diagnosis not present

## 2020-07-16 DIAGNOSIS — R2230 Localized swelling, mass and lump, unspecified upper limb: Secondary | ICD-10-CM | POA: Diagnosis not present

## 2020-07-22 DIAGNOSIS — I129 Hypertensive chronic kidney disease with stage 1 through stage 4 chronic kidney disease, or unspecified chronic kidney disease: Secondary | ICD-10-CM | POA: Diagnosis not present

## 2020-07-25 DIAGNOSIS — N644 Mastodynia: Secondary | ICD-10-CM | POA: Diagnosis not present

## 2020-07-25 DIAGNOSIS — R928 Other abnormal and inconclusive findings on diagnostic imaging of breast: Secondary | ICD-10-CM | POA: Diagnosis not present

## 2020-07-25 DIAGNOSIS — N6311 Unspecified lump in the right breast, upper outer quadrant: Secondary | ICD-10-CM | POA: Diagnosis not present

## 2020-07-25 DIAGNOSIS — N6321 Unspecified lump in the left breast, upper outer quadrant: Secondary | ICD-10-CM | POA: Diagnosis not present

## 2020-07-25 DIAGNOSIS — R922 Inconclusive mammogram: Secondary | ICD-10-CM | POA: Diagnosis not present

## 2020-08-26 ENCOUNTER — Emergency Department (HOSPITAL_COMMUNITY): Payer: Medicare Other

## 2020-08-26 ENCOUNTER — Emergency Department (HOSPITAL_COMMUNITY)
Admission: EM | Admit: 2020-08-26 | Discharge: 2020-08-26 | Disposition: A | Discharge status: Home / Self Care | Payer: Medicare Other | Attending: Emergency Medicine | Admitting: Emergency Medicine

## 2020-08-26 ENCOUNTER — Other Ambulatory Visit: Payer: Self-pay

## 2020-08-26 DIAGNOSIS — Z7982 Long term (current) use of aspirin: Secondary | ICD-10-CM | POA: Insufficient documentation

## 2020-08-26 DIAGNOSIS — E039 Hypothyroidism, unspecified: Secondary | ICD-10-CM | POA: Insufficient documentation

## 2020-08-26 DIAGNOSIS — R0902 Hypoxemia: Secondary | ICD-10-CM | POA: Diagnosis not present

## 2020-08-26 DIAGNOSIS — N182 Chronic kidney disease, stage 2 (mild): Secondary | ICD-10-CM | POA: Diagnosis not present

## 2020-08-26 DIAGNOSIS — Z79899 Other long term (current) drug therapy: Secondary | ICD-10-CM | POA: Insufficient documentation

## 2020-08-26 DIAGNOSIS — Z96652 Presence of left artificial knee joint: Secondary | ICD-10-CM | POA: Diagnosis not present

## 2020-08-26 DIAGNOSIS — I129 Hypertensive chronic kidney disease with stage 1 through stage 4 chronic kidney disease, or unspecified chronic kidney disease: Secondary | ICD-10-CM | POA: Insufficient documentation

## 2020-08-26 DIAGNOSIS — R519 Headache, unspecified: Secondary | ICD-10-CM

## 2020-08-26 DIAGNOSIS — R42 Dizziness and giddiness: Secondary | ICD-10-CM | POA: Insufficient documentation

## 2020-08-26 DIAGNOSIS — J449 Chronic obstructive pulmonary disease, unspecified: Secondary | ICD-10-CM | POA: Diagnosis not present

## 2020-08-26 DIAGNOSIS — Z87891 Personal history of nicotine dependence: Secondary | ICD-10-CM | POA: Diagnosis not present

## 2020-08-26 DIAGNOSIS — Z85828 Personal history of other malignant neoplasm of skin: Secondary | ICD-10-CM | POA: Diagnosis not present

## 2020-08-26 DIAGNOSIS — I2510 Atherosclerotic heart disease of native coronary artery without angina pectoris: Secondary | ICD-10-CM | POA: Insufficient documentation

## 2020-08-26 DIAGNOSIS — G4489 Other headache syndrome: Secondary | ICD-10-CM | POA: Diagnosis not present

## 2020-08-26 DIAGNOSIS — I491 Atrial premature depolarization: Secondary | ICD-10-CM | POA: Diagnosis not present

## 2020-08-26 LAB — CBC WITH DIFFERENTIAL/PLATELET
Abs Immature Granulocytes: 0.01 10*3/uL (ref 0.00–0.07)
Basophils Absolute: 0 10*3/uL (ref 0.0–0.1)
Basophils Relative: 1 %
Eosinophils Absolute: 0.1 10*3/uL (ref 0.0–0.5)
Eosinophils Relative: 1 %
HCT: 44.2 % (ref 36.0–46.0)
Hemoglobin: 14.3 g/dL (ref 12.0–15.0)
Immature Granulocytes: 0 %
Lymphocytes Relative: 16 %
Lymphs Abs: 0.7 10*3/uL (ref 0.7–4.0)
MCH: 29.2 pg (ref 26.0–34.0)
MCHC: 32.4 g/dL (ref 30.0–36.0)
MCV: 90.2 fL (ref 80.0–100.0)
Monocytes Absolute: 0.4 10*3/uL (ref 0.1–1.0)
Monocytes Relative: 9 %
Neutro Abs: 3.3 10*3/uL (ref 1.7–7.7)
Neutrophils Relative %: 73 %
Platelets: 164 10*3/uL (ref 150–400)
RBC: 4.9 MIL/uL (ref 3.87–5.11)
RDW: 14.7 % (ref 11.5–15.5)
WBC: 4.5 10*3/uL (ref 4.0–10.5)
nRBC: 0 % (ref 0.0–0.2)

## 2020-08-26 LAB — URINALYSIS, ROUTINE W REFLEX MICROSCOPIC
Bacteria, UA: NONE SEEN
Bilirubin Urine: NEGATIVE
Glucose, UA: NEGATIVE mg/dL
Hgb urine dipstick: NEGATIVE
Ketones, ur: NEGATIVE mg/dL
Leukocytes,Ua: NEGATIVE
Nitrite: NEGATIVE
Protein, ur: NEGATIVE mg/dL
Specific Gravity, Urine: 1.008 (ref 1.005–1.030)
pH: 9 — ABNORMAL HIGH (ref 5.0–8.0)

## 2020-08-26 LAB — COMPREHENSIVE METABOLIC PANEL
ALT: 10 U/L (ref 0–44)
AST: 19 U/L (ref 15–41)
Albumin: 4.1 g/dL (ref 3.5–5.0)
Alkaline Phosphatase: 64 U/L (ref 38–126)
Anion gap: 8 (ref 5–15)
BUN: 16 mg/dL (ref 8–23)
CO2: 27 mmol/L (ref 22–32)
Calcium: 9.2 mg/dL (ref 8.9–10.3)
Chloride: 107 mmol/L (ref 98–111)
Creatinine, Ser: 0.84 mg/dL (ref 0.44–1.00)
GFR, Estimated: >60 mL/min (ref >60)
Glucose, Bld: 93 mg/dL (ref 70–99)
Potassium: 3.9 mmol/L (ref 3.5–5.1)
Sodium: 142 mmol/L (ref 135–145)
Total Bilirubin: 0.8 mg/dL (ref 0.3–1.2)
Total Protein: 7.2 g/dL (ref 6.5–8.1)

## 2020-08-26 LAB — TROPONIN I (HIGH SENSITIVITY)
Troponin I (High Sensitivity): 5 ng/L (ref <18)
Troponin I (High Sensitivity): 4 ng/L (ref <18)

## 2020-08-26 MED ORDER — ACETAMINOPHEN 325 MG PO TABS
650.0000 mg | ORAL_TABLET | Freq: Once | ORAL | Status: AC
  Administered 2020-08-26: 650 mg via ORAL
  Filled 2020-08-26: qty 2

## 2020-08-26 MED ORDER — MECLIZINE HCL 25 MG PO TABS
25.0000 mg | ORAL_TABLET | Freq: Once | ORAL | Status: AC
  Administered 2020-08-26: 25 mg via ORAL
  Filled 2020-08-26: qty 1

## 2020-08-26 MED ORDER — AMLODIPINE BESYLATE 5 MG PO TABS
2.5000 mg | ORAL_TABLET | Freq: Once | ORAL | Status: AC
  Administered 2020-08-26: 2.5 mg via ORAL
  Filled 2020-08-26: qty 1

## 2020-08-26 NOTE — ED Triage Notes (Signed)
[facility] from home. Pt called due to severe headache, and dizziness. Headache started yesterday, but was mild then. Pt became hypertensive with EMS 174/80. Hx vertigo and hypertension.

## 2020-08-26 NOTE — ED Notes (Signed)
Pt ambulate at baseline. Pt has no complaints. Dr. Regenia Skeeter and [place] PA in hallway to see pt ambulate. [place], [place] made aware of pt orthostatic VS. Per [place], [place] give pt something to eat and something to drink. Pt given ham sandwich and soda with ice. Pt eating at bedside. Pt daughter in room.

## 2020-08-26 NOTE — ED Provider Notes (Signed)
[facility] DEPT Provider Note   CSN: [id number] Arrival date & time: 08/26/20  7829     History No chief complaint on file.   Becky Mccall is a 87 y.o. female.  The history is provided by the patient. No language interpreter was used.  Headache Pain location:  Generalized Radiates to:  Does not radiate Severity currently:  8/10 Severity at highest:  8/10 Onset quality:  Gradual Duration:  1 day Timing:  Constant Progression:  Worsening Chronicity:  New Similar to prior headaches: yes   Context: not activity and not caffeine   Relieved by:  Nothing Worsened by:  Nothing Ineffective treatments:  None tried Associated symptoms: dizziness   Associated symptoms: no hearing loss and no loss of balance   Pt reports she is dizzy.  Pt reports dizziness is worse when she turns her head.      Past Medical History:  Diagnosis Date   Brain aneurysm    CAD in native artery    a.  R/LHC 11/15/16 showing normal LVEDP, mild nonobstructive CAD (30% prox RCA, 40% ostial RPDA, 45% D1, 45% dLAD), severe AS with mean gradient 40.78mmHg, AVA 0.76cm2.    Carotid stenosis    a. 1-39% by duplex 2018.   Cervical spondylolysis    Chronic lower back pain    CKD (chronic kidney disease), stage II    COPD (chronic obstructive pulmonary disease) (HCC)    "mild" (12/09/2016)   DDD (degenerative disc disease), lumbar    GERD (gastroesophageal reflux disease)    Hard of hearing    wears hearing aides both ears   Hyperlipidemia    can't take the meds so takes Fish Oil   Hypertension    Hypothyroidism    IBS (irritable bowel syndrome)    Intermittent dysphagia    for pills and solids   Osteopenia    S/P TAVR (transcatheter aortic valve replacement) 12/07/2016   26 mm Edwards Sapien 3 transcatheter heart valve placed via open left transcarotid approach    Severe aortic stenosis    a. s/p TAVR 11/2016.   Vertigo     Patient Active Problem List   Diagnosis  Date Noted   Status post total left knee replacement 04/11/2018   CAD (coronary artery disease), native coronary artery 01/24/2018   PVC's (premature ventricular contractions) 10/11/2017   Leg cramps 10/11/2017   Essential hypertension 10/10/2017   Chronic pain of right knee 03/17/2017   Unilateral primary osteoarthritis, left knee 03/17/2017   Unilateral primary osteoarthritis, right knee 03/17/2017   Chronic pain of left knee 02/07/2017   S/P TAVR (transcatheter aortic valve replacement) 12/07/2016   Severe aortic stenosis 12/07/2016   Fibrocystic breast disease    IBS (irritable bowel syndrome)    Cervical spondylolysis    Osteopenia    Vitamin D deficiency    DDD (degenerative disc disease), lumbar    Squamous cell skin cancer    Carotid stenosis    OSA (obstructive sleep apnea)    Colon polyp    Vertigo 05/04/2013   Hard of hearing    Hyperlipidemia    COPD (chronic obstructive pulmonary disease) ([place])    Hypothyroidism    GERD (gastroesophageal reflux disease)    Spinal stenosis in cervical region 02/19/2013    Past Surgical History:  Procedure Laterality Date   ABDOMINAL HYSTERECTOMY     APPENDECTOMY     BACK SURGERY  2009   "on her lower back; not sure what they did" (  12/09/2016)   BLEPHAROPLASTY Bilateral    CARDIAC CATHETERIZATION     CARDIAC VALVE REPLACEMENT  12/07/2016   Edwards Sapien 3 THV (size 26 mm, model # 9600TFX, serial # [id number]   CATARACT EXTRACTION W/ INTRAOCULAR LENS  IMPLANT, BILATERAL  1999,2000   CATARACT EXTRACTION, BILATERAL Bilateral    COLONOSCOPY W/ BIOPSIES AND POLYPECTOMY  11/2003   Archie Endo 07/08/2010   EYE SURGERY     HEMORROIDECTOMY     LUMBAR LAMINECTOMY  1999   LUMBAR LAMINECTOMY/DECOMPRESSION MICRODISCECTOMY  09/1999   Archie Endo 07/08/2010    POSTERIOR FUSION LUMBAR SPINE  2014   RIGHT/LEFT HEART CATH AND CORONARY ANGIOGRAPHY N/A 11/15/2016   Procedure: RIGHT/LEFT HEART CATH AND CORONARY ANGIOGRAPHY;  Surgeon: [name], MD;   Location: [facility];  Service: Cardiovascular;  Laterality: N/A;   SKIN GRAFT SPLIT THICKNESS LEG / FOOT Left 09/2000   Archie Endo 07/08/2010   TEE WITHOUT CARDIOVERSION N/A 12/07/2016   Procedure: TRANSESOPHAGEAL ECHOCARDIOGRAM (TEE);  Surgeon: [name], MD;  Location: [facility];  Service: Open Heart Surgery;  Laterality: N/A;   TONSILLECTOMY     TOTAL KNEE ARTHROPLASTY Left 04/11/2018   TOTAL KNEE ARTHROPLASTY Left 04/11/2018   Procedure: LEFT TOTAL KNEE ARTHROPLASTY;  Surgeon: Mcarthur Rossetti, MD;  Location: [facility];  Service: Orthopedics;  Laterality: Left;   TRANSCATHETER AORTIC VALVE REPLACEMENT, TRANSFEMORAL  12/07/2016   Edwards Sapien 3 THV (size 26 mm, model # 9600TFX, serial # [id number]     OB History   No obstetric history on file.     Family History  Problem Relation Age of Onset   Cancer - Lung Mother    Heart attack Father    Cancer - Lung Brother    Cancer - Lung Maternal Aunt     Social History   Tobacco Use   Smoking status: Former    Packs/day: 2.00    Years: 40.00    Pack years: 80.00    Types: Cigarettes    Quit date: 08/02/1989    Years since quitting: 31.0   Smokeless tobacco: Never  Vaping Use   Vaping Use: Never used  Substance Use Topics   Alcohol use: Yes    Alcohol/week: 14.0 standard drinks    Types: 7 Glasses of wine, 7 Standard drinks or equivalent per week   Drug use: No    Home Medications Prior to Admission medications   Medication Sig Start Date End Date Taking? Authorizing Provider  amLODipine (NORVASC) 2.5 MG tablet Take 2.5 mg by mouth daily.    [provider]  aspirin 81 MG chewable tablet Chew 1 tablet (81 mg total) by mouth 2 (two) times daily. 04/13/18   Mcarthur Rossetti, MD  Calcium Carb-Cholecalciferol (CALCIUM 1000 + D PO) Take 1 tablet by mouth daily.     [provider]  Cholecalciferol (VITAMIN D) 2000 UNITS tablet Take 6,000 Units by mouth daily.     [provider]   Cinnamon 500 MG capsule Take 500 mg by mouth 2 (two) times daily.     [provider]  doxylamine, Sleep, (UNISOM) 25 MG tablet Take 25 mg by mouth at bedtime.    [provider]  levothyroxine (SYNTHROID, LEVOTHROID) 137 MCG tablet Take 137 mcg by mouth daily before breakfast.    [provider]  magnesium oxide (MAG-OX) 400 MG tablet Take 800 mg by mouth at bedtime.    [provider]  meclizine (ANTIVERT) 12.5 MG tablet Take 1 tablet (12.5  mg total) by mouth 3 (three) times daily as needed for dizziness. 12/15/17   Charlynne Cousins, MD  methocarbamol (ROBAXIN) 500 MG tablet Take 1 tablet (500 mg total) by mouth every 6 (six) hours as needed for muscle spasms. 04/13/18   Mcarthur Rossetti, MD  Multiple Vitamin (MULTIVITAMIN WITH MINERALS) TABS tablet Take 1 tablet by mouth daily.    [provider]  Omega-3 Fatty Acids (FISH OIL) 1000 MG CAPS Take 1,000 mg by mouth 2 (two) times daily.    [provider]  oxyCODONE (OXY IR/ROXICODONE) 5 MG immediate release tablet Take 1-2 tablets (5-10 mg total) by mouth every 4 (four) hours as needed for moderate pain (pain score 4-6). 04/13/18   Mcarthur Rossetti, MD  Polyethyl Glycol-Propyl Glycol (SYSTANE OP) Place 1 drop into both eyes 2 (two) times daily as needed (dry eyes).    [provider]  promethazine (PHENERGAN) 12.5 MG tablet Take 1 tablet (12.5 mg total) by mouth every 6 (six) hours as needed for nausea or vomiting. 12/29/17   Kathrynn Ducking, MD  tetrahydrozoline 0.05 % ophthalmic solution Place 1 drop into both eyes.    [provider]  tiotropium (SPIRIVA) 18 MCG inhalation capsule Place 18 mcg into inhaler and inhale daily.    [provider]  traMADol (ULTRAM) 50 MG tablet Take 1 tablet (50 mg total) by mouth every 8 (eight) hours as needed (pain not relieved by tylenol). 05/05/13   Barton Dubois, MD  Turmeric 500 MG CAPS Take 500 mg by mouth 2  (two) times daily.    [provider]  vitamin B-12 (CYANOCOBALAMIN) 1000 MCG tablet Take 1,000 mcg by mouth daily.    [provider]  vitamin C (ASCORBIC ACID) 500 MG tablet Take 500 mg by mouth daily.    [provider]  vitamin E 400 UNIT capsule Take 400 Units by mouth 2 (two) times daily.     [provider]    Allergies    Lisinopril, Relafen [nabumetone], Statins, Tape, Welchol [colesevelam], Zetia [ezetimibe], Neosporin [neomycin-bacitracin zn-polymyx], and Zinc oxide  Review of Systems   Review of Systems  HENT:  Negative for hearing loss.   Neurological:  Positive for dizziness and headaches. Negative for loss of balance.  All other systems reviewed and are negative.  Physical Exam Updated Vital Signs BP (!) 165/87   Pulse 88   Temp 98.4 F (36.9 C) (Oral)   Resp 18   LMP  (LMP Unknown)   SpO2 95%   Physical Exam Vitals reviewed.  Constitutional:      Appearance: Normal appearance.  HENT:     Right Ear: Tympanic membrane normal.     Left Ear: Tympanic membrane normal.     Mouth/Throat:     Mouth: Mucous membranes are moist.  Eyes:     Pupils: Pupils are equal, round, and reactive to light.  Cardiovascular:     Rate and Rhythm: Normal rate and regular rhythm.     Pulses: Normal pulses.     Heart sounds: Normal heart sounds.  Pulmonary:     Effort: Pulmonary effort is normal.     Breath sounds: Normal breath sounds.  Abdominal:     General: Abdomen is flat.  Musculoskeletal:        General: Normal range of motion.     Cervical back: Normal range of motion.  Skin:    General: Skin is warm.  Neurological:     General: No focal  deficit present.     Mental Status: She is alert.  Psychiatric:        Mood and Affect: Mood normal.    ED Results / Procedures / Treatments   Labs (all labs ordered are listed, but only abnormal results are displayed) Labs Reviewed  URINALYSIS, ROUTINE W REFLEX MICROSCOPIC - Abnormal;  Notable for the following components:      Result Value   Color, Urine STRAW (*)    pH 9.0 (*)    All other components within normal limits  CBC WITH DIFFERENTIAL/PLATELET  COMPREHENSIVE METABOLIC PANEL  TROPONIN I (HIGH SENSITIVITY)  TROPONIN I (HIGH SENSITIVITY)    EKG EKG Interpretation  Date/Time:  Tuesday August 26 2020 07:42:51 EDT Ventricular Rate:  78 PR Interval:  207 QRS Duration: 98 QT Interval:  402 QTC Calculation: 458 R Axis:   -24 Text Interpretation: Sinus rhythm Ventricular bigeminy Borderline left axis deviation Confirmed by Sherwood Gambler [phone]) on 08/26/2020 7:52:24 AM  Radiology CT Head Wo Contrast  Result Date: 08/26/2020 CLINICAL DATA:  Severe headache and dizziness. EXAM: CT HEAD WITHOUT CONTRAST TECHNIQUE: Contiguous axial images were obtained from the base of the skull through the vertex without intravenous contrast. COMPARISON:  12/09/2016 FINDINGS: Brain: There is no evidence for acute hemorrhage, hydrocephalus, mass lesion, or abnormal extra-axial fluid collection. No definite CT evidence for acute infarction. Diffuse loss of parenchymal volume is consistent with atrophy. Patchy low attenuation in the deep hemispheric and periventricular white matter is nonspecific, but likely reflects chronic microvascular ischemic demyelination. Vascular: No hyperdense vessel or unexpected calcification.3 Skull: No evidence for fracture. No worrisome lytic or sclerotic lesion. Sinuses/Orbits: The visualized paranasal sinuses and mastoid air cells are clear. Visualized portions of the globes and intraorbital fat are unremarkable. Other: None. IMPRESSION: 1. Stable exam.  No acute intracranial abnormality. 2. Atrophy with chronic small vessel white matter ischemic disease. Electronically Signed   By: Misty Stanley M.D.   On: 08/26/2020 08:21    Procedures Procedures   Medications Ordered in ED Medications  amLODipine (NORVASC) tablet 2.5 mg (2.5 mg Oral Given 08/26/20 0912)   meclizine (ANTIVERT) tablet 25 mg (25 mg Oral Given 08/26/20 0912)  acetaminophen (TYLENOL) tablet 650 mg (650 mg Oral Given 08/26/20 1914)    ED Course  I have reviewed the triage vital signs and the nursing notes.  Pertinent labs & imaging results that were available during my care of the patient were reviewed by me and considered in my medical decision making (see chart for details).    MDM Rules/Calculators/A&P                          MDM:  Ct head no acute abnormality,  labs normal, ua no sign of infection.  Pt did not take her blood pressure medication this am.  Pt given tylenol, amlodipine and antivert.  Pt able to ambulate up and down hall without difficulty.   Dr. Regenia Skeeter evaluated pt.  Pt given rx for antivert, advised tylenol.  Pt advised to follow up with her MD for recheck  Final Clinical Impression(s) / ED Diagnoses Final diagnoses:  Acute nonintractable headache, unspecified headache type  Vertigo    Rx / DC Orders ED Discharge Orders     None     An After Visit Summary was printed and given to the patient.    Fransico Meadow, Hershal Coria 08/26/20 1219    Sherwood Gambler, MD 08/26/20 [phone]

## 2020-09-01 DIAGNOSIS — M545 Low back pain, unspecified: Secondary | ICD-10-CM | POA: Diagnosis not present

## 2020-09-19 DIAGNOSIS — I129 Hypertensive chronic kidney disease with stage 1 through stage 4 chronic kidney disease, or unspecified chronic kidney disease: Secondary | ICD-10-CM | POA: Diagnosis not present

## 2020-10-22 DIAGNOSIS — I129 Hypertensive chronic kidney disease with stage 1 through stage 4 chronic kidney disease, or unspecified chronic kidney disease: Secondary | ICD-10-CM | POA: Diagnosis not present

## 2020-11-19 DIAGNOSIS — N183 Chronic kidney disease, stage 3 unspecified: Secondary | ICD-10-CM | POA: Diagnosis not present

## 2020-11-19 DIAGNOSIS — I129 Hypertensive chronic kidney disease with stage 1 through stage 4 chronic kidney disease, or unspecified chronic kidney disease: Secondary | ICD-10-CM | POA: Diagnosis not present

## 2020-11-19 DIAGNOSIS — E039 Hypothyroidism, unspecified: Secondary | ICD-10-CM | POA: Diagnosis not present

## 2020-11-19 DIAGNOSIS — F419 Anxiety disorder, unspecified: Secondary | ICD-10-CM | POA: Diagnosis not present

## 2021-01-07 DIAGNOSIS — R599 Enlarged lymph nodes, unspecified: Secondary | ICD-10-CM | POA: Diagnosis not present

## 2021-01-22 DIAGNOSIS — M545 Low back pain, unspecified: Secondary | ICD-10-CM | POA: Diagnosis not present

## 2021-01-22 DIAGNOSIS — S39012A Strain of muscle, fascia and tendon of lower back, initial encounter: Secondary | ICD-10-CM | POA: Diagnosis not present

## 2021-01-27 DIAGNOSIS — R350 Frequency of micturition: Secondary | ICD-10-CM | POA: Diagnosis not present

## 2021-02-09 DIAGNOSIS — H5319 Other subjective visual disturbances: Secondary | ICD-10-CM | POA: Diagnosis not present

## 2021-02-09 DIAGNOSIS — H16223 Keratoconjunctivitis sicca, not specified as Sjogren's, bilateral: Secondary | ICD-10-CM | POA: Diagnosis not present

## 2021-02-09 DIAGNOSIS — H35373 Puckering of macula, bilateral: Secondary | ICD-10-CM | POA: Diagnosis not present

## 2021-02-09 DIAGNOSIS — H43393 Other vitreous opacities, bilateral: Secondary | ICD-10-CM | POA: Diagnosis not present

## 2021-02-12 DIAGNOSIS — N183 Chronic kidney disease, stage 3 unspecified: Secondary | ICD-10-CM | POA: Diagnosis not present

## 2021-02-12 DIAGNOSIS — I129 Hypertensive chronic kidney disease with stage 1 through stage 4 chronic kidney disease, or unspecified chronic kidney disease: Secondary | ICD-10-CM | POA: Diagnosis not present

## 2021-02-12 DIAGNOSIS — E039 Hypothyroidism, unspecified: Secondary | ICD-10-CM | POA: Diagnosis not present

## 2021-02-12 DIAGNOSIS — E785 Hyperlipidemia, unspecified: Secondary | ICD-10-CM | POA: Diagnosis not present

## 2021-03-18 DIAGNOSIS — E785 Hyperlipidemia, unspecified: Secondary | ICD-10-CM | POA: Diagnosis not present

## 2021-03-18 DIAGNOSIS — I129 Hypertensive chronic kidney disease with stage 1 through stage 4 chronic kidney disease, or unspecified chronic kidney disease: Secondary | ICD-10-CM | POA: Diagnosis not present

## 2021-03-18 DIAGNOSIS — F419 Anxiety disorder, unspecified: Secondary | ICD-10-CM | POA: Diagnosis not present

## 2021-03-18 DIAGNOSIS — N183 Chronic kidney disease, stage 3 unspecified: Secondary | ICD-10-CM | POA: Diagnosis not present

## 2021-03-19 ENCOUNTER — Ambulatory Visit
Admission: RE | Admit: 2021-03-19 | Discharge: 2021-03-19 | Disposition: A | Discharge status: Home / Self Care | Payer: Medicare Other | Source: Ambulatory Visit | Attending: Family Medicine | Admitting: Family Medicine

## 2021-03-19 ENCOUNTER — Other Ambulatory Visit: Payer: Self-pay | Admitting: Family Medicine

## 2021-03-19 DIAGNOSIS — J449 Chronic obstructive pulmonary disease, unspecified: Secondary | ICD-10-CM

## 2021-03-19 DIAGNOSIS — I129 Hypertensive chronic kidney disease with stage 1 through stage 4 chronic kidney disease, or unspecified chronic kidney disease: Secondary | ICD-10-CM | POA: Diagnosis not present

## 2021-03-19 DIAGNOSIS — R238 Other skin changes: Secondary | ICD-10-CM | POA: Diagnosis not present

## 2021-03-19 DIAGNOSIS — E039 Hypothyroidism, unspecified: Secondary | ICD-10-CM | POA: Diagnosis not present

## 2021-03-19 DIAGNOSIS — J439 Emphysema, unspecified: Secondary | ICD-10-CM | POA: Diagnosis not present

## 2021-04-29 DIAGNOSIS — L89151 Pressure ulcer of sacral region, stage 1: Secondary | ICD-10-CM | POA: Diagnosis not present

## 2021-05-18 DIAGNOSIS — K6289 Other specified diseases of anus and rectum: Secondary | ICD-10-CM | POA: Diagnosis not present

## 2021-05-18 DIAGNOSIS — L89151 Pressure ulcer of sacral region, stage 1: Secondary | ICD-10-CM | POA: Diagnosis not present

## 2021-05-18 DIAGNOSIS — I872 Venous insufficiency (chronic) (peripheral): Secondary | ICD-10-CM | POA: Diagnosis not present

## 2021-05-18 DIAGNOSIS — E039 Hypothyroidism, unspecified: Secondary | ICD-10-CM | POA: Diagnosis not present

## 2021-05-21 DIAGNOSIS — L89151 Pressure ulcer of sacral region, stage 1: Secondary | ICD-10-CM | POA: Diagnosis not present

## 2021-05-21 DIAGNOSIS — K6289 Other specified diseases of anus and rectum: Secondary | ICD-10-CM | POA: Diagnosis not present

## 2021-05-21 DIAGNOSIS — I872 Venous insufficiency (chronic) (peripheral): Secondary | ICD-10-CM | POA: Diagnosis not present

## 2021-05-25 DIAGNOSIS — K219 Gastro-esophageal reflux disease without esophagitis: Secondary | ICD-10-CM | POA: Diagnosis not present

## 2021-05-25 DIAGNOSIS — E785 Hyperlipidemia, unspecified: Secondary | ICD-10-CM | POA: Diagnosis not present

## 2021-05-25 DIAGNOSIS — J449 Chronic obstructive pulmonary disease, unspecified: Secondary | ICD-10-CM | POA: Diagnosis not present

## 2021-05-25 DIAGNOSIS — E039 Hypothyroidism, unspecified: Secondary | ICD-10-CM | POA: Diagnosis not present

## 2021-05-27 DIAGNOSIS — G301 Alzheimer's disease with late onset: Secondary | ICD-10-CM | POA: Diagnosis not present

## 2021-05-27 DIAGNOSIS — F02A18 Dementia in other diseases classified elsewhere, mild, with other behavioral disturbance: Secondary | ICD-10-CM | POA: Diagnosis not present

## 2021-06-21 DIAGNOSIS — L89151 Pressure ulcer of sacral region, stage 1: Secondary | ICD-10-CM | POA: Diagnosis not present

## 2021-06-21 DIAGNOSIS — K6289 Other specified diseases of anus and rectum: Secondary | ICD-10-CM | POA: Diagnosis not present

## 2021-06-21 DIAGNOSIS — I872 Venous insufficiency (chronic) (peripheral): Secondary | ICD-10-CM | POA: Diagnosis not present

## 2021-06-24 ENCOUNTER — Other Ambulatory Visit: Payer: Self-pay | Admitting: Gastroenterology

## 2021-06-24 DIAGNOSIS — K5900 Constipation, unspecified: Secondary | ICD-10-CM | POA: Diagnosis not present

## 2021-06-24 DIAGNOSIS — R634 Abnormal weight loss: Secondary | ICD-10-CM

## 2021-06-24 DIAGNOSIS — K6289 Other specified diseases of anus and rectum: Secondary | ICD-10-CM | POA: Diagnosis not present

## 2021-06-24 DIAGNOSIS — L309 Dermatitis, unspecified: Secondary | ICD-10-CM | POA: Diagnosis not present

## 2021-06-25 DIAGNOSIS — C44622 Squamous cell carcinoma of skin of right upper limb, including shoulder: Secondary | ICD-10-CM | POA: Diagnosis not present

## 2021-06-25 DIAGNOSIS — D492 Neoplasm of unspecified behavior of bone, soft tissue, and skin: Secondary | ICD-10-CM | POA: Diagnosis not present

## 2021-06-25 DIAGNOSIS — L820 Inflamed seborrheic keratosis: Secondary | ICD-10-CM | POA: Diagnosis not present

## 2021-06-25 DIAGNOSIS — L818 Other specified disorders of pigmentation: Secondary | ICD-10-CM | POA: Diagnosis not present

## 2021-06-25 DIAGNOSIS — L821 Other seborrheic keratosis: Secondary | ICD-10-CM | POA: Diagnosis not present

## 2021-06-25 DIAGNOSIS — R208 Other disturbances of skin sensation: Secondary | ICD-10-CM | POA: Diagnosis not present

## 2021-07-13 DIAGNOSIS — E039 Hypothyroidism, unspecified: Secondary | ICD-10-CM | POA: Diagnosis not present

## 2021-07-22 ENCOUNTER — Ambulatory Visit
Admission: RE | Admit: 2021-07-22 | Discharge: 2021-07-22 | Disposition: A | Discharge status: Home / Self Care | Payer: Medicare Other | Source: Ambulatory Visit | Attending: Gastroenterology | Admitting: Gastroenterology

## 2021-07-22 DIAGNOSIS — R634 Abnormal weight loss: Secondary | ICD-10-CM

## 2021-07-22 DIAGNOSIS — N1330 Unspecified hydronephrosis: Secondary | ICD-10-CM | POA: Diagnosis not present

## 2021-07-28 DIAGNOSIS — R634 Abnormal weight loss: Secondary | ICD-10-CM | POA: Diagnosis not present

## 2021-07-28 DIAGNOSIS — K5904 Chronic idiopathic constipation: Secondary | ICD-10-CM | POA: Diagnosis not present

## 2021-07-28 DIAGNOSIS — K6289 Other specified diseases of anus and rectum: Secondary | ICD-10-CM | POA: Diagnosis not present

## 2021-08-05 DIAGNOSIS — N183 Chronic kidney disease, stage 3 unspecified: Secondary | ICD-10-CM | POA: Diagnosis not present

## 2021-08-05 DIAGNOSIS — I129 Hypertensive chronic kidney disease with stage 1 through stage 4 chronic kidney disease, or unspecified chronic kidney disease: Secondary | ICD-10-CM | POA: Diagnosis not present

## 2021-08-05 DIAGNOSIS — I493 Ventricular premature depolarization: Secondary | ICD-10-CM | POA: Diagnosis not present

## 2021-08-05 DIAGNOSIS — E785 Hyperlipidemia, unspecified: Secondary | ICD-10-CM | POA: Diagnosis not present

## 2021-08-10 DIAGNOSIS — I129 Hypertensive chronic kidney disease with stage 1 through stage 4 chronic kidney disease, or unspecified chronic kidney disease: Secondary | ICD-10-CM | POA: Diagnosis not present

## 2021-08-10 DIAGNOSIS — E785 Hyperlipidemia, unspecified: Secondary | ICD-10-CM | POA: Diagnosis not present

## 2021-08-10 DIAGNOSIS — J449 Chronic obstructive pulmonary disease, unspecified: Secondary | ICD-10-CM | POA: Diagnosis not present

## 2021-08-10 DIAGNOSIS — E039 Hypothyroidism, unspecified: Secondary | ICD-10-CM | POA: Diagnosis not present

## 2021-09-30 DIAGNOSIS — I129 Hypertensive chronic kidney disease with stage 1 through stage 4 chronic kidney disease, or unspecified chronic kidney disease: Secondary | ICD-10-CM | POA: Diagnosis not present

## 2021-09-30 DIAGNOSIS — E039 Hypothyroidism, unspecified: Secondary | ICD-10-CM | POA: Diagnosis not present

## 2021-09-30 DIAGNOSIS — J449 Chronic obstructive pulmonary disease, unspecified: Secondary | ICD-10-CM | POA: Diagnosis not present

## 2021-09-30 DIAGNOSIS — E785 Hyperlipidemia, unspecified: Secondary | ICD-10-CM | POA: Diagnosis not present

## 2021-11-12 DIAGNOSIS — Z6827 Body mass index (BMI) 27.0-27.9, adult: Secondary | ICD-10-CM | POA: Diagnosis not present

## 2021-11-12 DIAGNOSIS — Z23 Encounter for immunization: Secondary | ICD-10-CM | POA: Diagnosis not present

## 2021-11-12 DIAGNOSIS — H6121 Impacted cerumen, right ear: Secondary | ICD-10-CM | POA: Diagnosis not present

## 2022-01-15 ENCOUNTER — Other Ambulatory Visit: Payer: Self-pay

## 2022-01-15 ENCOUNTER — Inpatient Hospital Stay (HOSPITAL_COMMUNITY)
Admission: EM | Admit: 2022-01-15 | Discharge: 2022-01-26 | DRG: 179 | Disposition: A | Discharge status: Skilled Nursing Facility | Payer: Medicare Other | Attending: Internal Medicine | Admitting: Internal Medicine

## 2022-01-15 ENCOUNTER — Encounter (HOSPITAL_COMMUNITY): Payer: Self-pay | Admitting: Internal Medicine

## 2022-01-15 ENCOUNTER — Emergency Department (HOSPITAL_COMMUNITY): Payer: Medicare Other

## 2022-01-15 DIAGNOSIS — Z8249 Family history of ischemic heart disease and other diseases of the circulatory system: Secondary | ICD-10-CM | POA: Diagnosis not present

## 2022-01-15 DIAGNOSIS — Z789 Other specified health status: Secondary | ICD-10-CM

## 2022-01-15 DIAGNOSIS — M8580 Other specified disorders of bone density and structure, unspecified site: Secondary | ICD-10-CM | POA: Diagnosis not present

## 2022-01-15 DIAGNOSIS — R42 Dizziness and giddiness: Secondary | ICD-10-CM

## 2022-01-15 DIAGNOSIS — E559 Vitamin D deficiency, unspecified: Secondary | ICD-10-CM | POA: Diagnosis not present

## 2022-01-15 DIAGNOSIS — I350 Nonrheumatic aortic (valve) stenosis: Secondary | ICD-10-CM | POA: Diagnosis not present

## 2022-01-15 DIAGNOSIS — Z7982 Long term (current) use of aspirin: Secondary | ICD-10-CM

## 2022-01-15 DIAGNOSIS — Z91048 Other nonmedicinal substance allergy status: Secondary | ICD-10-CM

## 2022-01-15 DIAGNOSIS — K219 Gastro-esophageal reflux disease without esophagitis: Secondary | ICD-10-CM | POA: Diagnosis not present

## 2022-01-15 DIAGNOSIS — R059 Cough, unspecified: Secondary | ICD-10-CM | POA: Diagnosis not present

## 2022-01-15 DIAGNOSIS — R509 Fever, unspecified: Secondary | ICD-10-CM | POA: Diagnosis not present

## 2022-01-15 DIAGNOSIS — M25422 Effusion, left elbow: Secondary | ICD-10-CM | POA: Diagnosis not present

## 2022-01-15 DIAGNOSIS — Z8679 Personal history of other diseases of the circulatory system: Secondary | ICD-10-CM

## 2022-01-15 DIAGNOSIS — Z79890 Hormone replacement therapy (postmenopausal): Secondary | ICD-10-CM

## 2022-01-15 DIAGNOSIS — R1310 Dysphagia, unspecified: Secondary | ICD-10-CM | POA: Diagnosis present

## 2022-01-15 DIAGNOSIS — Z90710 Acquired absence of both cervix and uterus: Secondary | ICD-10-CM | POA: Diagnosis not present

## 2022-01-15 DIAGNOSIS — M5136 Other intervertebral disc degeneration, lumbar region: Secondary | ICD-10-CM | POA: Diagnosis not present

## 2022-01-15 DIAGNOSIS — Z8669 Personal history of other diseases of the nervous system and sense organs: Secondary | ICD-10-CM

## 2022-01-15 DIAGNOSIS — J449 Chronic obstructive pulmonary disease, unspecified: Secondary | ICD-10-CM | POA: Diagnosis not present

## 2022-01-15 DIAGNOSIS — N182 Chronic kidney disease, stage 2 (mild): Secondary | ICD-10-CM | POA: Diagnosis not present

## 2022-01-15 DIAGNOSIS — I2510 Atherosclerotic heart disease of native coronary artery without angina pectoris: Secondary | ICD-10-CM | POA: Diagnosis present

## 2022-01-15 DIAGNOSIS — E8809 Other disorders of plasma-protein metabolism, not elsewhere classified: Secondary | ICD-10-CM | POA: Diagnosis not present

## 2022-01-15 DIAGNOSIS — Z883 Allergy status to other anti-infective agents status: Secondary | ICD-10-CM

## 2022-01-15 DIAGNOSIS — F02A18 Dementia in other diseases classified elsewhere, mild, with other behavioral disturbance: Secondary | ICD-10-CM | POA: Diagnosis not present

## 2022-01-15 DIAGNOSIS — Z952 Presence of prosthetic heart valve: Secondary | ICD-10-CM

## 2022-01-15 DIAGNOSIS — Z79899 Other long term (current) drug therapy: Secondary | ICD-10-CM

## 2022-01-15 DIAGNOSIS — K5900 Constipation, unspecified: Secondary | ICD-10-CM | POA: Diagnosis not present

## 2022-01-15 DIAGNOSIS — R531 Weakness: Secondary | ICD-10-CM

## 2022-01-15 DIAGNOSIS — I129 Hypertensive chronic kidney disease with stage 1 through stage 4 chronic kidney disease, or unspecified chronic kidney disease: Secondary | ICD-10-CM | POA: Diagnosis present

## 2022-01-15 DIAGNOSIS — Z87891 Personal history of nicotine dependence: Secondary | ICD-10-CM | POA: Diagnosis not present

## 2022-01-15 DIAGNOSIS — M25522 Pain in left elbow: Secondary | ICD-10-CM | POA: Diagnosis present

## 2022-01-15 DIAGNOSIS — E039 Hypothyroidism, unspecified: Secondary | ICD-10-CM | POA: Diagnosis not present

## 2022-01-15 DIAGNOSIS — G301 Alzheimer's disease with late onset: Secondary | ICD-10-CM | POA: Diagnosis not present

## 2022-01-15 DIAGNOSIS — M4302 Spondylolysis, cervical region: Secondary | ICD-10-CM | POA: Diagnosis not present

## 2022-01-15 DIAGNOSIS — I959 Hypotension, unspecified: Secondary | ICD-10-CM | POA: Diagnosis not present

## 2022-01-15 DIAGNOSIS — Z888 Allergy status to other drugs, medicaments and biological substances status: Secondary | ICD-10-CM

## 2022-01-15 DIAGNOSIS — E785 Hyperlipidemia, unspecified: Secondary | ICD-10-CM | POA: Diagnosis present

## 2022-01-15 DIAGNOSIS — Z961 Presence of intraocular lens: Secondary | ICD-10-CM | POA: Diagnosis not present

## 2022-01-15 DIAGNOSIS — Z9842 Cataract extraction status, left eye: Secondary | ICD-10-CM | POA: Diagnosis not present

## 2022-01-15 DIAGNOSIS — Z751 Person awaiting admission to adequate facility elsewhere: Secondary | ICD-10-CM

## 2022-01-15 DIAGNOSIS — Z96652 Presence of left artificial knee joint: Secondary | ICD-10-CM | POA: Diagnosis present

## 2022-01-15 DIAGNOSIS — M6281 Muscle weakness (generalized): Secondary | ICD-10-CM | POA: Diagnosis not present

## 2022-01-15 DIAGNOSIS — Z801 Family history of malignant neoplasm of trachea, bronchus and lung: Secondary | ICD-10-CM

## 2022-01-15 DIAGNOSIS — Z86010 Personal history of colonic polyps: Secondary | ICD-10-CM

## 2022-01-15 DIAGNOSIS — Z9841 Cataract extraction status, right eye: Secondary | ICD-10-CM | POA: Diagnosis not present

## 2022-01-15 DIAGNOSIS — U071 COVID-19: Secondary | ICD-10-CM | POA: Diagnosis not present

## 2022-01-15 DIAGNOSIS — Z602 Problems related to living alone: Secondary | ICD-10-CM | POA: Diagnosis present

## 2022-01-15 DIAGNOSIS — M19022 Primary osteoarthritis, left elbow: Secondary | ICD-10-CM | POA: Diagnosis not present

## 2022-01-15 DIAGNOSIS — I10 Essential (primary) hypertension: Secondary | ICD-10-CM | POA: Diagnosis not present

## 2022-01-15 DIAGNOSIS — Z66 Do not resuscitate: Secondary | ICD-10-CM | POA: Diagnosis present

## 2022-01-15 DIAGNOSIS — R4189 Other symptoms and signs involving cognitive functions and awareness: Secondary | ICD-10-CM | POA: Diagnosis present

## 2022-01-15 DIAGNOSIS — Z981 Arthrodesis status: Secondary | ICD-10-CM

## 2022-01-15 DIAGNOSIS — Z8719 Personal history of other diseases of the digestive system: Secondary | ICD-10-CM

## 2022-01-15 DIAGNOSIS — G8929 Other chronic pain: Secondary | ICD-10-CM | POA: Diagnosis present

## 2022-01-15 DIAGNOSIS — Z7951 Long term (current) use of inhaled steroids: Secondary | ICD-10-CM

## 2022-01-15 DIAGNOSIS — Z953 Presence of xenogenic heart valve: Secondary | ICD-10-CM

## 2022-01-15 DIAGNOSIS — K589 Irritable bowel syndrome without diarrhea: Secondary | ICD-10-CM | POA: Diagnosis not present

## 2022-01-15 DIAGNOSIS — E7800 Pure hypercholesterolemia, unspecified: Secondary | ICD-10-CM | POA: Diagnosis not present

## 2022-01-15 DIAGNOSIS — T68XXXA Hypothermia, initial encounter: Secondary | ICD-10-CM | POA: Diagnosis not present

## 2022-01-15 DIAGNOSIS — H9193 Unspecified hearing loss, bilateral: Secondary | ICD-10-CM | POA: Diagnosis present

## 2022-01-15 DIAGNOSIS — M170 Bilateral primary osteoarthritis of knee: Secondary | ICD-10-CM | POA: Diagnosis not present

## 2022-01-15 LAB — CBC WITH DIFFERENTIAL/PLATELET
Abs Immature Granulocytes: 0.01 10*3/uL (ref 0.00–0.07)
Basophils Absolute: 0 10*3/uL (ref 0.0–0.1)
Basophils Relative: 0 %
Eosinophils Absolute: 0 10*3/uL (ref 0.0–0.5)
Eosinophils Relative: 0 %
HCT: 45.1 % (ref 36.0–46.0)
Hemoglobin: 14.8 g/dL (ref 12.0–15.0)
Immature Granulocytes: 0 %
Lymphocytes Relative: 14 %
Lymphs Abs: 0.9 10*3/uL (ref 0.7–4.0)
MCH: 30.9 pg (ref 26.0–34.0)
MCHC: 32.8 g/dL (ref 30.0–36.0)
MCV: 94.2 fL (ref 80.0–100.0)
Monocytes Absolute: 0.9 10*3/uL (ref 0.1–1.0)
Monocytes Relative: 13 %
Neutro Abs: 4.9 10*3/uL (ref 1.7–7.7)
Neutrophils Relative %: 73 %
Platelets: 148 10*3/uL — ABNORMAL LOW (ref 150–400)
RBC: 4.79 MIL/uL (ref 3.87–5.11)
RDW: 13.6 % (ref 11.5–15.5)
WBC: 6.8 10*3/uL (ref 4.0–10.5)
nRBC: 0 % (ref 0.0–0.2)

## 2022-01-15 LAB — COMPREHENSIVE METABOLIC PANEL
ALT: 11 U/L (ref 0–44)
AST: 28 U/L (ref 15–41)
Albumin: 3.9 g/dL (ref 3.5–5.0)
Alkaline Phosphatase: 72 U/L (ref 38–126)
Anion gap: 8 (ref 5–15)
BUN: 19 mg/dL (ref 8–23)
CO2: 26 mmol/L (ref 22–32)
Calcium: 8.9 mg/dL (ref 8.9–10.3)
Chloride: 107 mmol/L (ref 98–111)
Creatinine, Ser: 1.08 mg/dL — ABNORMAL HIGH (ref 0.44–1.00)
GFR, Estimated: 49 mL/min — ABNORMAL LOW (ref >60)
Glucose, Bld: 120 mg/dL — ABNORMAL HIGH (ref 70–99)
Potassium: 4.5 mmol/L (ref 3.5–5.1)
Sodium: 141 mmol/L (ref 135–145)
Total Bilirubin: 0.7 mg/dL (ref 0.3–1.2)
Total Protein: 7.6 g/dL (ref 6.5–8.1)

## 2022-01-15 LAB — CK: Total CK: 64 U/L (ref 38–234)

## 2022-01-15 LAB — RESP PANEL BY RT-PCR (FLU A&B, COVID) ARPGX2
Influenza A by PCR: NEGATIVE
Influenza B by PCR: NEGATIVE
SARS Coronavirus 2 by RT PCR: POSITIVE — AB

## 2022-01-15 MED ORDER — NIRMATRELVIR/RITONAVIR (PAXLOVID)TABLET
3.0000 | ORAL_TABLET | Freq: Two times a day (BID) | ORAL | Status: DC
  Filled 2022-01-15: qty 30

## 2022-01-15 MED ORDER — HYDRALAZINE HCL 20 MG/ML IJ SOLN: 10.0000 mg | Freq: Three times a day (TID) | INTRAMUSCULAR | Status: DC | PRN

## 2022-01-15 MED ORDER — ENOXAPARIN SODIUM 40 MG/0.4ML IJ SOSY
40.0000 mg | PREFILLED_SYRINGE | INTRAMUSCULAR | Status: DC
  Administered 2022-01-15 – 2022-01-25 (×11): 40 mg via SUBCUTANEOUS
  Filled 2022-01-15 (×11): qty 0.4

## 2022-01-15 MED ORDER — NIRMATRELVIR/RITONAVIR (PAXLOVID) TABLET (RENAL DOSING)
2.0000 | ORAL_TABLET | Freq: Two times a day (BID) | ORAL | Status: AC
  Administered 2022-01-15 – 2022-01-20 (×10): 2 via ORAL
  Filled 2022-01-15: qty 20

## 2022-01-15 MED ORDER — IPRATROPIUM-ALBUTEROL 20-100 MCG/ACT IN AERS
1.0000 | INHALATION_SPRAY | Freq: Four times a day (QID) | RESPIRATORY_TRACT | Status: DC
  Administered 2022-01-15: 1 via RESPIRATORY_TRACT
  Filled 2022-01-15: qty 4

## 2022-01-15 MED ORDER — SODIUM CHLORIDE 0.9 % IV BOLUS
500.0000 mL | Freq: Once | INTRAVENOUS | Status: AC
  Administered 2022-01-15: 500 mL via INTRAVENOUS

## 2022-01-15 MED ORDER — GUAIFENESIN-DM 100-10 MG/5ML PO SYRP
10.0000 mL | ORAL_SOLUTION | ORAL | Status: DC | PRN
  Administered 2022-01-16 – 2022-01-18 (×3): 10 mL via ORAL
  Filled 2022-01-15 (×3): qty 10

## 2022-01-15 MED ORDER — SODIUM CHLORIDE 0.9 % IV SOLN: INTRAVENOUS | Status: DC

## 2022-01-15 MED ORDER — HYDROCOD POLI-CHLORPHE POLI ER 10-8 MG/5ML PO SUER
5.0000 mL | Freq: Two times a day (BID) | ORAL | Status: DC | PRN
  Administered 2022-01-16: 5 mL via ORAL
  Filled 2022-01-15: qty 5

## 2022-01-15 NOTE — ED Triage Notes (Signed)
Pt bib ems from home c/o generalized weakness x 2 weeks, non productive cough x 2 days.  152 cbg 80 hr  142/88 96% room air

## 2022-01-15 NOTE — ED Provider Notes (Signed)
[facility] DEPT Provider Note   CSN: [id number] Arrival date & time: 01/15/22  1226     History  Chief Complaint  Patient presents with   Weakness    Becky Mccall is a 88 y.o. female.  HPI    88 year old female comes in with chief complaint of generalized weakness. I spoke with patient's daughter, she states that patient started getting weak about a week ago.  However, in the last 2 days she has deteriorated very quickly.  Patient was able to ambulate to the bathroom, but then started needing assistance with that activity starting 2 days ago.  This morning patient was unable to get out of the bed.  Patient lives by herself.  She gets caregiver coming to her for assistance 3 hours in a day.  On Wednesday, the caregiver called out because of severe cold, they are unsure if the caregiver has COVID.  Patient denies any chest pain, shortness of breath, UTI-like symptoms, abdominal pain, nausea or vomiting.  She is having a cough that started yesterday.  Home Medications Prior to Admission medications   Medication Sig Start Date End Date Taking? Authorizing Provider  acetaminophen (TYLENOL) 500 MG tablet Take 500 mg by mouth every 6 (six) hours as needed for mild pain.    [provider]  amLODipine (NORVASC) 5 MG tablet Take 5 mg by mouth daily. 07/09/20   [provider]  aspirin 81 MG chewable tablet Chew 1 tablet (81 mg total) by mouth 2 (two) times daily. 04/13/18   Mcarthur Rossetti, MD  Calcium Carb-Cholecalciferol (CALCIUM 1000 + D PO) Take 1 tablet by mouth daily.     [provider]  cholecalciferol (VITAMIN D) 25 MCG (1000 UNIT) tablet Take 2,000 Units by mouth daily.    [provider]  Cinnamon 500 MG capsule Take 500 mg by mouth 2 (two) times daily.     [provider]  diphenhydrAMINE (BENADRYL) 25 MG tablet Take 25 mg by mouth at bedtime as needed for sleep.    [provider]   doxylamine, Sleep, (UNISOM) 25 MG tablet Take 25 mg by mouth at bedtime.    [provider]  hydrOXYzine (ATARAX/VISTARIL) 25 MG tablet Take 12.5-25 mg by mouth 3 (three) times daily as needed for anxiety. 07/22/20   [provider]  levothyroxine (SYNTHROID) 150 MCG tablet Take 150 mcg by mouth every morning. 06/17/20   [provider]  magnesium oxide (MAG-OX) 400 MG tablet Take 800 mg by mouth at bedtime.    [provider]  meclizine (ANTIVERT) 12.5 MG tablet Take 1 tablet (12.5 mg total) by mouth 3 (three) times daily as needed for dizziness. 12/15/17   Charlynne Cousins, MD  methocarbamol (ROBAXIN) 500 MG tablet Take 1 tablet (500 mg total) by mouth every 6 (six) hours as needed for muscle spasms. Patient not taking: Reported on 08/26/2020 04/13/18   Mcarthur Rossetti, MD  Multiple Vitamin (MULTIVITAMIN WITH MINERALS) TABS tablet Take 1 tablet by mouth daily.    [provider]  Omega-3 Fatty Acids (FISH OIL) 1000 MG CAPS Take 1,000 mg by mouth 2 (two) times daily.    [provider]  oxyCODONE (OXY IR/ROXICODONE) 5 MG immediate release tablet Take 1-2 tablets (5-10 mg total) by mouth every 4 (four) hours as needed for moderate pain (pain score 4-6). Patient not taking: Reported on 08/26/2020 04/13/18   Mcarthur Rossetti, MD  Polyethyl Glycol-Propyl Glycol (SYSTANE OP) Place 1  drop into both eyes 2 (two) times daily as needed (dry eyes). Patient not taking: Reported on 08/26/2020    [provider]  pravastatin (PRAVACHOL) 10 MG tablet Take 10 mg by mouth once a week. 05/12/20   [provider]  promethazine (PHENERGAN) 12.5 MG tablet Take 1 tablet (12.5 mg total) by mouth every 6 (six) hours as needed for nausea or vomiting. Patient not taking: Reported on 08/26/2020 12/29/17   Kathrynn Ducking, MD  tiotropium (SPIRIVA) 18 MCG inhalation capsule Place 18 mcg into inhaler and inhale daily.    [provider]   traMADol (ULTRAM) 50 MG tablet Take 1 tablet (50 mg total) by mouth every 8 (eight) hours as needed (pain not relieved by tylenol). 05/05/13   Barton Dubois, MD  Turmeric 500 MG CAPS Take 500 mg by mouth 2 (two) times daily.    [provider]  vitamin B-12 (CYANOCOBALAMIN) 1000 MCG tablet Take 1,000 mcg by mouth daily.    [provider]  vitamin C (ASCORBIC ACID) 500 MG tablet Take 500 mg by mouth daily.    [provider]  vitamin E 400 UNIT capsule Take 400 Units by mouth 2 (two) times daily.     [provider]      Allergies    Lisinopril, Relafen [nabumetone], Statins, Tape, Welchol [colesevelam], Zetia [ezetimibe], Neosporin [neomycin-bacitracin zn-polymyx], and Zinc oxide    Review of Systems   Review of Systems  Physical Exam Updated Vital Signs BP (!) 162/64   Pulse 76   Temp 98.8 F (37.1 C) (Oral)   Resp 13   Ht '5\' 6"'$  (1.676 m)   Wt 80 kg   LMP  (LMP Unknown)   SpO2 96%   BMI 28.47 kg/m  Physical Exam Vitals and nursing note reviewed.  Constitutional:      Appearance: She is well-developed.  HENT:     Head: Atraumatic.  Cardiovascular:     Rate and Rhythm: Normal rate.  Pulmonary:     Effort: Pulmonary effort is normal.  Musculoskeletal:     Cervical back: Normal range of motion and neck supple.  Skin:    General: Skin is warm and dry.  Neurological:     Mental Status: She is alert and oriented to person, place, and time.     Cranial Nerves: No cranial nerve deficit.     Sensory: No sensory deficit.     Motor: No weakness.     ED Results / Procedures / Treatments   Labs (all labs ordered are listed, but only abnormal results are displayed) Labs Reviewed  RESP PANEL BY RT-PCR (FLU A&B, COVID) ARPGX2 - Abnormal; Notable for the following components:      Result Value   SARS Coronavirus 2 by RT PCR POSITIVE (*)    All other components within normal limits  COMPREHENSIVE METABOLIC PANEL - Abnormal; Notable for the  following components:   Glucose, Bld 120 (*)    Creatinine, Ser 1.08 (*)    GFR, Estimated 49 (*)    All other components within normal limits  CBC WITH DIFFERENTIAL/PLATELET - Abnormal; Notable for the following components:   Platelets 148 (*)    All other components within normal limits  CK  URINALYSIS, ROUTINE W REFLEX MICROSCOPIC    EKG EKG Interpretation  Date/Time:  Friday January 15 2022 12:38:43 EST Ventricular Rate:  81 PR Interval:  211 QRS Duration: 105 QT Interval:  378 QTC Calculation: 439 R Axis:   -21  Text Interpretation: Sinus rhythm Borderline left axis deviation Abnormal R-wave progression, early transition No acute changes No old tracing to compare Confirmed by Varney Biles [phone]) on 01/15/2022 3:26:52 PM  Radiology DG Chest Port 1 View  Result Date: 01/15/2022 CLINICAL DATA:  Cough. EXAM: PORTABLE CHEST 1 VIEW COMPARISON:  Chest radiograph dated March 19, 2021 FINDINGS: The heart size and mediastinal contours are within normal limits. TAVR is unchanged. Low lung volumes, without evidence of focal consolidation or pleural effusion. The visualized skeletal structures are unremarkable. IMPRESSION: Low lung volumes, without evidence of acute cardiopulmonary disease. Electronically Signed   By: Keane Police D.O.   On: 01/15/2022 13:25    Procedures Procedures    Medications Ordered in ED Medications  nirmatrelvir/ritonavir EUA (PAXLOVID) 3 tablet (has no administration in time range)  sodium chloride 0.9 % bolus 500 mL (500 mLs Intravenous New Bag/Given 01/15/22 1309)    ED Course/ Medical Decision Making/ A&P                           Medical Decision Making Amount and/or Complexity of Data Reviewed Labs: ordered. Radiology: ordered.  Risk Decision regarding hospitalization.   88 year old patient comes in with chief complaint of generalized weakness. She denies any chest pain, shortness of breath, URI-like symptoms besides cough, abdominal  pain, UTI-like symptoms, nausea or vomiting.  She has no focal weakness or neurodeficits.  Differential considered includes severe electrolyte abnormality, renal failure, medication side effects, COVID-19, especially in the setting of sick contacts.  Influenza and other URI also possible along with dehydration.  Basic labs ordered.  Patient noted to have COVID-19.  Results discussed with the patient.  Patient will be admitted to the hospital for COVID-19.  I will start her on Paxlovid.  No hypoxia.   Final Clinical Impression(s) / ED Diagnoses Final diagnoses:  [id number]    Rx / DC Orders ED Discharge Orders     None         Varney Biles, MD 01/15/22 1541

## 2022-01-15 NOTE — Progress Notes (Addendum)
Dictation on: 01/15/2022 12:39 PM by: Varney Biles [[id number]]

## 2022-01-15 NOTE — H&P (Addendum)
History and Physical    Patient: Becky Mccall DOB: 23-Apr-1929 DOA: 01/15/2022 DOS: the patient was seen and examined on 01/15/2022 PCP: [name], MD  Patient coming from: Home  Chief Complaint:  Chief Complaint  Patient presents with   Weakness   HPI: Becky Mccall is a 88 y.o. female with medical history significant of COPD, HTN, hypothyroidism, HLD. Presenting with generalized weakness and cough. Her family reports that she has been progressively weak over the last couple of weeks. She has had a little more difficulty getting around the house. She says her legs shake, but there is no pain. She does not have any lightheadedness or dizziness. She has not had any falls. Her weakness increased significantly yesterday. She felt chills. She started having cough and congestion. This morning she seemed worse to her family. They decided she needed to come to the ED for assistance. She denies any other aggravating or alleviating factors.   Review of Systems: As mentioned in the history of present illness. All other systems reviewed and are negative. Past Medical History:  Diagnosis Date   Brain aneurysm    CAD in native artery    a.  R/LHC 11/15/16 showing normal LVEDP, mild nonobstructive CAD (30% prox RCA, 40% ostial RPDA, 45% D1, 45% dLAD), severe AS with mean gradient 40.71mHg, AVA 0.76cm2.    Carotid stenosis    a. 1-39% by duplex 2018.   Cervical spondylolysis    Chronic lower back pain    CKD (chronic kidney disease), stage II    COPD (chronic obstructive pulmonary disease) (HCC)    "mild" (12/09/2016)   DDD (degenerative disc disease), lumbar    GERD (gastroesophageal reflux disease)    Hard of hearing    wears hearing aides both ears   Hyperlipidemia    can't take the meds so takes Fish Oil   Hypertension    Hypothyroidism    IBS (irritable bowel syndrome)    Intermittent dysphagia    for pills and solids   Osteopenia    S/P TAVR (transcatheter  aortic valve replacement) 12/07/2016   26 mm Edwards Sapien 3 transcatheter heart valve placed via open left transcarotid approach    Severe aortic stenosis    a. s/p TAVR 11/2016.   Vertigo    Past Surgical History:  Procedure Laterality Date   ABDOMINAL HYSTERECTOMY     APPENDECTOMY     BACK SURGERY  2009   "on her lower back; not sure what they did" (12/09/2016)   BLEPHAROPLASTY Bilateral    CARDIAC CATHETERIZATION     CARDIAC VALVE REPLACEMENT  12/07/2016   Edwards Sapien 3 THV (size 26 mm, model # 9600TFX, serial # [id number]  CATARACT EXTRACTION W/ INTRAOCULAR LENS  IMPLANT, BILATERAL  1999,2000   CATARACT EXTRACTION, BILATERAL Bilateral    COLONOSCOPY W/ BIOPSIES AND POLYPECTOMY  11/2003   /Archie Endo5/16/2012   EYE SURGERY     HEMORROIDECTOMY     LUMBAR LAMINECTOMY  1999   LUMBAR LAMINECTOMY/DECOMPRESSION MICRODISCECTOMY  09/1999   /Archie Endo5/16/2012    POSTERIOR FUSION LUMBAR SPINE  2014   RIGHT/LEFT HEART CATH AND CORONARY ANGIOGRAPHY N/A 11/15/2016   Procedure: RIGHT/LEFT HEART CATH AND CORONARY ANGIOGRAPHY;  Surgeon: HLeonie Man MD;  [facility];  Service: Cardiovascular;  Laterality: N/A;   SKIN GRAFT SPLIT THICKNESS LEG / FOOT Left 09/2000   /Archie Endo5/16/2012   TEE WITHOUT CARDIOVERSION N/A 12/07/2016   Procedure: TRANSESOPHAGEAL ECHOCARDIOGRAM (TEE);  Surgeon:  [name], MD;  Location: [facility];  Service: Open Heart Surgery;  Laterality: N/A;   TONSILLECTOMY     TOTAL KNEE ARTHROPLASTY Left 04/11/2018   TOTAL KNEE ARTHROPLASTY Left 04/11/2018   Procedure: LEFT TOTAL KNEE ARTHROPLASTY;  Surgeon: Mcarthur Rossetti, MD;  Location: [facility];  Service: Orthopedics;  Laterality: Left;   TRANSCATHETER AORTIC VALVE REPLACEMENT, TRANSFEMORAL  12/07/2016   Edwards Sapien 3 THV (size 26 mm, model # 9600TFX, serial # [id number]   Social History:  reports that she quit smoking about 32 years ago. Her smoking use included cigarettes. She has a 80.00 pack-year  smoking history. She has never used smokeless tobacco. She reports current alcohol use of about 14.0 standard drinks of alcohol per week. She reports that she does not use drugs.  Allergies  Allergen Reactions   Lisinopril Cough   Relafen [Nabumetone] Other (See Comments)    Unsure of exact reaction.   Statins     Muscle pain   Tape     Developed blisters from clear tape used over cath site 10/2016   [facility] [Colesevelam] Other (See Comments)    Muscle pain   Zetia [Ezetimibe] Other (See Comments)    Muscle pain.   Neosporin [Neomycin-Bacitracin Zn-Polymyx] Other (See Comments)    Cause wound/scratch to worsen   Zinc Oxide Rash    Family History  Problem Relation Age of Onset   Cancer - Lung Mother    Heart attack Father    Cancer - Lung Brother    Cancer - Lung Maternal Aunt     Prior to Admission medications   Medication Sig Start Date End Date Taking? Authorizing Provider  acetaminophen (TYLENOL) 500 MG tablet Take 500 mg by mouth every 6 (six) hours as needed for mild pain.    [provider]  amLODipine (NORVASC) 5 MG tablet Take 5 mg by mouth daily. 07/09/20   [provider]  aspirin 81 MG chewable tablet Chew 1 tablet (81 mg total) by mouth 2 (two) times daily. 04/13/18   Mcarthur Rossetti, MD  Calcium Carb-Cholecalciferol (CALCIUM 1000 + D PO) Take 1 tablet by mouth daily.     [provider]  cholecalciferol (VITAMIN D) 25 MCG (1000 UNIT) tablet Take 2,000 Units by mouth daily.    [provider]  Cinnamon 500 MG capsule Take 500 mg by mouth 2 (two) times daily.     [provider]  diphenhydrAMINE (BENADRYL) 25 MG tablet Take 25 mg by mouth at bedtime as needed for sleep.    [provider]  doxylamine, Sleep, (UNISOM) 25 MG tablet Take 25 mg by mouth at bedtime.    [provider]  hydrOXYzine (ATARAX/VISTARIL) 25 MG tablet Take 12.5-25 mg by mouth 3 (three) times daily as needed for anxiety. 07/22/20    [provider]  levothyroxine (SYNTHROID) 150 MCG tablet Take 150 mcg by mouth every morning. 06/17/20   [provider]  magnesium oxide (MAG-OX) 400 MG tablet Take 800 mg by mouth at bedtime.    [provider]  meclizine (ANTIVERT) 12.5 MG tablet Take 1 tablet (12.5 mg total) by mouth 3 (three) times daily as needed for dizziness. 12/15/17   Charlynne Cousins, MD  methocarbamol (ROBAXIN) 500 MG tablet Take 1 tablet (500 mg total) by mouth every 6 (six) hours as needed for muscle spasms. Patient not taking: Reported on 08/26/2020 04/13/18   Mcarthur Rossetti, MD  Multiple Vitamin (MULTIVITAMIN WITH MINERALS) TABS tablet Take  1 tablet by mouth daily.    [provider]  Omega-3 Fatty Acids (FISH OIL) 1000 MG CAPS Take 1,000 mg by mouth 2 (two) times daily.    [provider]  oxyCODONE (OXY IR/ROXICODONE) 5 MG immediate release tablet Take 1-2 tablets (5-10 mg total) by mouth every 4 (four) hours as needed for moderate pain (pain score 4-6). Patient not taking: Reported on 08/26/2020 04/13/18   Mcarthur Rossetti, MD  Polyethyl Glycol-Propyl Glycol (SYSTANE OP) Place 1 drop into both eyes 2 (two) times daily as needed (dry eyes). Patient not taking: Reported on 08/26/2020    [provider]  pravastatin (PRAVACHOL) 10 MG tablet Take 10 mg by mouth once a week. 05/12/20   [provider]  promethazine (PHENERGAN) 12.5 MG tablet Take 1 tablet (12.5 mg total) by mouth every 6 (six) hours as needed for nausea or vomiting. Patient not taking: Reported on 08/26/2020 12/29/17   Kathrynn Ducking, MD  tiotropium (SPIRIVA) 18 MCG inhalation capsule Place 18 mcg into inhaler and inhale daily.    [provider]  traMADol (ULTRAM) 50 MG tablet Take 1 tablet (50 mg total) by mouth every 8 (eight) hours as needed (pain not relieved by tylenol). 05/05/13   Barton Dubois, MD  Turmeric 500 MG CAPS Take 500 mg by mouth 2 (two) times daily.     [provider]  vitamin B-12 (CYANOCOBALAMIN) 1000 MCG tablet Take 1,000 mcg by mouth daily.    [provider]  vitamin C (ASCORBIC ACID) 500 MG tablet Take 500 mg by mouth daily.    [provider]  vitamin E 400 UNIT capsule Take 400 Units by mouth 2 (two) times daily.     [provider]    Physical Exam: Vitals:   01/15/22 1236 01/15/22 1345 01/15/22 1430 01/15/22 1515  BP: (!) 146/61 (!) 147/63 (!) 161/62 (!) 162/64  Pulse: 79 76 74 76  Resp: '18 17 17 13  '$ Temp: 98.8 F (37.1 C)     TempSrc: Oral     SpO2: 94% 95% 95% 96%  Weight:      Height:       General: 88 y.o. female resting in bed in NAD Eyes: PERRL, normal sclera ENMT: Nares patent w/o discharge, orophaynx clear, dentition normal, ears w/o discharge/lesions/ulcers Neck: Supple, trachea midline Cardiovascular: RRR, +S1, S2, no m/g/r, equal pulses throughout Respiratory: CTABL, no w/r/r, normal WOB GI: BS+, NDNT, no masses noted, no organomegaly noted MSK: No e/c/c Neuro: A&O x 3, no focal deficits Psyc: Appropriate interaction and affect, calm/cooperative  Data Reviewed:  Results for orders placed or performed during the hospital encounter of 01/15/22 (from the past 24 hour(s))  Comprehensive metabolic panel     Status: Abnormal   Collection Time: 01/15/22  1:08 PM  Result Value Ref Range   Sodium 141 135 - 145 mmol/L   Potassium 4.5 3.5 - 5.1 mmol/L   Chloride 107 98 - 111 mmol/L   CO2 26 22 - 32 mmol/L   Glucose, Bld 120 (H) 70 - 99 mg/dL   BUN 19 8 - 23 mg/dL   Creatinine, Ser 1.08 (H) 0.44 - 1.00 mg/dL   Calcium 8.9 8.9 - 10.3 mg/dL   Total Protein 7.6 6.5 - 8.1 g/dL   Albumin 3.9 3.5 - 5.0 g/dL   AST 28 15 - 41 U/L   ALT 11 0 - 44 U/L   Alkaline Phosphatase 72 38 - 126 U/L   Total Bilirubin  0.7 0.3 - 1.2 mg/dL   GFR, Estimated 49 (L) >60 mL/min   Anion gap 8 5 - 15  CBC with Differential     Status: Abnormal   Collection Time: 01/15/22  1:08 PM  Result Value  Ref Range   WBC 6.8 4.0 - 10.5 K/uL   RBC 4.79 3.87 - 5.11 MIL/uL   Hemoglobin 14.8 12.0 - 15.0 g/dL   HCT 45.1 36.0 - 46.0 %   MCV 94.2 80.0 - 100.0 fL   MCH 30.9 26.0 - 34.0 pg   MCHC 32.8 30.0 - 36.0 g/dL   RDW 13.6 11.5 - 15.5 %   Platelets 148 (L) 150 - 400 K/uL   nRBC 0.0 0.0 - 0.2 %   Neutrophils Relative % 73 %   Neutro Abs 4.9 1.7 - 7.7 K/uL   Lymphocytes Relative 14 %   Lymphs Abs 0.9 0.7 - 4.0 K/uL   Monocytes Relative 13 %   Monocytes Absolute 0.9 0.1 - 1.0 K/uL   Eosinophils Relative 0 %   Eosinophils Absolute 0.0 0.0 - 0.5 K/uL   Basophils Relative 0 %   Basophils Absolute 0.0 0.0 - 0.1 K/uL   Immature Granulocytes 0 %   Abs Immature Granulocytes 0.01 0.00 - 0.07 K/uL  CK     Status: None   Collection Time: 01/15/22  1:08 PM  Result Value Ref Range   Total CK 64 38 - 234 U/L  Resp Panel by RT-PCR (Flu A&B, Covid) Anterior Nasal Swab     Status: Abnormal   Collection Time: 01/15/22  2:08 PM   Specimen: Anterior Nasal Swab  Result Value Ref Range   SARS Coronavirus 2 by RT PCR POSITIVE (A) NEGATIVE   Influenza A by PCR NEGATIVE NEGATIVE   Influenza B by PCR NEGATIVE NEGATIVE   CXR: Low lung volumes, without evidence of acute cardiopulmonary disease.   Assessment and Plan: COVID 19 Infection     - place in obs, med-surg     - she's not hypoxic     - paxlovid, guaifenesin, combivent     - IS     - fluids  Generalized weakness     - fluids     - PT/OT eval  COPD     - continue home regimen when confirmed  HLD CAD     - continue home regimen when confirmed  HTN     - PRN hydralazine. Hold home amlodipine d/t paxlovid interaction  Hypothyroidism     - continue home regimen when confirmed  Hx of vertigo     - continue home regimen when confirmed  Advance Care Planning:   Code Status: DNR; confirmed w/ multiple lines of questioning  Consults: None  Family Communication: w/ family at bedside  Severity of Illness: The appropriate patient  status for this patient is OBSERVATION. Observation status is judged to be reasonable and necessary in order to provide the required intensity of service to ensure the patient's safety. The patient's presenting symptoms, physical exam findings, and initial radiographic and laboratory data in the context of their medical condition is felt to place them at decreased risk for further clinical deterioration. Furthermore, it is anticipated that the patient will be medically stable for discharge from the hospital within 2 midnights of admission.   Author: Jonnie Finner, DO 01/15/2022 3:31 PM  For on call review [email or web address].

## 2022-01-15 NOTE — ED Notes (Signed)
ED TO INPATIENT HANDOFF REPORT  ED Nurse Name and Phone #: Charlean Sanfilippo RN   S Name/Age/Gender Becky Mccall 88 y.o. female Room/Bed: WA13/WA13  Code Status   Code Status: Prior  Home/SNF/Other Home Patient oriented to: self, place, time, and situation Is this baseline? Yes   Triage Complete: Triage complete  Chief Complaint COVID-19 [U07.1]  Triage Note Pt bib ems from home c/o generalized weakness x 2 weeks, non productive cough x 2 days.  152 cbg 80 hr  142/88 96% room air    Allergies Allergies  Allergen Reactions   Tape Rash    Developed blisters from clear tape used over cath site 10/2016   Bacitracin-Polymyxin B Rash   Lisinopril Cough   5-Alpha Reductase Inhibitors     Other Reaction(s): Not available   Statins     Muscle pain   Welchol [Colesevelam] Other (See Comments)    Muscle pain   Zetia [Ezetimibe] Other (See Comments)    Muscle pain.   Nabumetone Other (See Comments)    Unsure of exact reaction.  Other Reaction(s): chest pain   Neosporin [Neomycin-Bacitracin Zn-Polymyx] Other (See Comments)    Cause wound/scratch to worsen   Zinc Oxide Rash    Level of Care/Admitting Diagnosis ED Disposition     ED Disposition  Admit   Condition  --   [place]: [place] [[id number]]  Level of Care: Med-Surg [16]  May place patient in observation at [facility] or [place] if equivalent level of care is available:: No  Covid Evaluation: Confirmed COVID Positive  Diagnosis: COVID-19 [[id number]]  Admitting Physician: Jonnie Finner [[id number]]  Attending Physician: Jonnie Finner [[id number]]          B Medical/Surgery History Past Medical History:  Diagnosis Date   Brain aneurysm    CAD in native artery    a.  [facility] 11/15/16 showing normal LVEDP, mild nonobstructive CAD (30% prox RCA, 40% ostial RPDA, 45% D1, 45% dLAD), severe AS with mean gradient 40.23mHg, AVA 0.76cm2.    Carotid stenosis    a. 1-39% by duplex  2018.   Cervical spondylolysis    Chronic lower back pain    CKD (chronic kidney disease), stage II    COPD (chronic obstructive pulmonary disease) (HCC)    "mild" (12/09/2016)   DDD (degenerative disc disease), lumbar    GERD (gastroesophageal reflux disease)    Hard of hearing    wears hearing aides both ears   Hyperlipidemia    can't take the meds so takes Fish Oil   Hypertension    Hypothyroidism    IBS (irritable bowel syndrome)    Intermittent dysphagia    for pills and solids   Osteopenia    S/P TAVR (transcatheter aortic valve replacement) 12/07/2016   26 mm Edwards Sapien 3 transcatheter heart valve placed via open left transcarotid approach    Severe aortic stenosis    a. s/p TAVR 11/2016.   Vertigo    Past Surgical History:  Procedure Laterality Date   ABDOMINAL HYSTERECTOMY     APPENDECTOMY     BACK SURGERY  2009   "on her lower back; not sure what they did" (12/09/2016)   BLEPHAROPLASTY Bilateral    CARDIAC CATHETERIZATION     CARDIAC VALVE REPLACEMENT  12/07/2016   Edwards Sapien 3 THV (size 26 mm, model # 9600TFX, serial # [id number]  CATARACT EXTRACTION W/ INTRAOCULAR LENS  IMPLANT, BILATERAL  1999,2000   CATARACT EXTRACTION,  BILATERAL Bilateral    COLONOSCOPY W/ BIOPSIES AND POLYPECTOMY  11/2003   Archie Endo 07/08/2010   EYE SURGERY     HEMORROIDECTOMY     LUMBAR LAMINECTOMY  1999   LUMBAR LAMINECTOMY/DECOMPRESSION MICRODISCECTOMY  09/1999   Archie Endo 07/08/2010    POSTERIOR FUSION LUMBAR SPINE  2014   RIGHT/LEFT HEART CATH AND CORONARY ANGIOGRAPHY N/A 11/15/2016   Procedure: RIGHT/LEFT HEART CATH AND CORONARY ANGIOGRAPHY;  Surgeon: [name], MD;  Location: [place] [facility];  Service: Cardiovascular;  Laterality: N/A;   SKIN GRAFT SPLIT THICKNESS LEG / FOOT Left 09/2000   Archie Endo 07/08/2010   TEE WITHOUT CARDIOVERSION N/A 12/07/2016   Procedure: TRANSESOPHAGEAL ECHOCARDIOGRAM (TEE);  Surgeon: [name], MD;  Location: [place];  Service: Open Heart  Surgery;  Laterality: N/A;   TONSILLECTOMY     TOTAL KNEE ARTHROPLASTY Left 04/11/2018   TOTAL KNEE ARTHROPLASTY Left 04/11/2018   Procedure: LEFT TOTAL KNEE ARTHROPLASTY;  Surgeon: Mcarthur Rossetti, MD;  Location: [place];  Service: Orthopedics;  Laterality: Left;   TRANSCATHETER AORTIC VALVE REPLACEMENT, TRANSFEMORAL  12/07/2016   Edwards Sapien 3 THV (size 26 mm, model # 9600TFX, serial # [id number]     A IV Location/Drains/Wounds Patient Lines/Drains/Airways Status     Active Line/Drains/Airways     Name Placement date Placement time Site Days   Peripheral IV 01/15/22 22 G Right Forearm 01/15/22  1300  Forearm  less than 1   Incision (Closed) 12/07/16 Neck Left 12/07/16  1426  -- 1865   Incision (Closed) 04/11/18 Knee Left 04/11/18  1525  -- 1375   Wound 02/20/13 Skin tear Arm Right 1" black scab 02/20/13  0800  Arm  3251            Intake/Output Last 24 hours No intake or output data in the 24 hours ending 01/15/22 1701  Labs/Imaging Results for orders placed or performed during the hospital encounter of 01/15/22 (from the past 48 hour(s))  Comprehensive metabolic panel     Status: Abnormal   Collection Time: 01/15/22  1:08 PM  Result Value Ref Range   Sodium 141 135 - 145 mmol/L   Potassium 4.5 3.5 - 5.1 mmol/L   Chloride 107 98 - 111 mmol/L   CO2 26 22 - 32 mmol/L   Glucose, Bld 120 (H) 70 - 99 mg/dL    Comment: Glucose reference range applies only to samples taken after fasting for at least 8 hours.   BUN 19 8 - 23 mg/dL   Creatinine, Ser 1.08 (H) 0.44 - 1.00 mg/dL   Calcium 8.9 8.9 - 10.3 mg/dL   Total Protein 7.6 6.5 - 8.1 g/dL   Albumin 3.9 3.5 - 5.0 g/dL   AST 28 15 - 41 U/L   ALT 11 0 - 44 U/L   Alkaline Phosphatase 72 38 - 126 U/L   Total Bilirubin 0.7 0.3 - 1.2 mg/dL   GFR, Estimated 49 (L) >60 mL/min    Comment: (NOTE) Calculated using the CKD-EPI Creatinine Equation (2021)    Anion gap 8 5 - 15    Comment: Performed at [facility], [place] [address], [place], [place] [address]  CBC with Differential     Status: Abnormal   Collection Time: 01/15/22  1:08 PM  Result Value Ref Range   WBC 6.8 4.0 - 10.5 K/uL   RBC 4.79 3.87 - 5.11 MIL/uL   Hemoglobin 14.8 12.0 - 15.0 g/dL   HCT 45.1 36.0 - 46.0 %  MCV 94.2 80.0 - 100.0 fL   MCH 30.9 26.0 - 34.0 pg   MCHC 32.8 30.0 - 36.0 g/dL   RDW 13.6 11.5 - 15.5 %   Platelets 148 (L) 150 - 400 K/uL   nRBC 0.0 0.0 - 0.2 %   Neutrophils Relative % 73 %   Neutro Abs 4.9 1.7 - 7.7 K/uL   Lymphocytes Relative 14 %   Lymphs Abs 0.9 0.7 - 4.0 K/uL   Monocytes Relative 13 %   Monocytes Absolute 0.9 0.1 - 1.0 K/uL   Eosinophils Relative 0 %   Eosinophils Absolute 0.0 0.0 - 0.5 K/uL   Basophils Relative 0 %   Basophils Absolute 0.0 0.0 - 0.1 K/uL   Immature Granulocytes 0 %   Abs Immature Granulocytes 0.01 0.00 - 0.07 K/uL    Comment: Performed at [facility], [place] [address], [place], [place] [address]  CK     Status: None   Collection Time: 01/15/22  1:08 PM  Result Value Ref Range   Total CK 64 38 - 234 U/L    Comment: Performed at [facility], [place] [address], [place], [place] [address]  Resp Panel by RT-PCR (Flu A&B, Covid) Anterior Nasal Swab     Status: Abnormal   Collection Time: 01/15/22  2:08 PM   Specimen: Anterior Nasal Swab  Result Value Ref Range   SARS Coronavirus 2 by RT PCR POSITIVE (A) NEGATIVE    Comment: (NOTE) SARS-CoV-2 target nucleic acids are DETECTED.  The SARS-CoV-2 RNA is generally detectable in upper respiratory specimens during the acute phase of infection. Positive results are indicative of the presence of the identified virus, but do not rule out bacterial infection or co-infection with other pathogens not detected by the test. Clinical correlation with patient history and other diagnostic information is necessary to determine patient infection status. The expected result is Negative.  Fact Sheet  for Patients: EntrepreneurPulse.com.au  Fact Sheet for Healthcare Providers: IncredibleEmployment.be  This test is not yet approved or cleared by the [place] FDA and  has been authorized for detection and/or diagnosis of SARS-CoV-2 by FDA under an Emergency Use Authorization (EUA).  This EUA will remain in effect (meaning this test can be used) for the duration of  the COVID-19 declaration under Section 564(b)(1) of the A ct, 21 U.S.C. section 360bbb-3(b)(1), unless the authorization is terminated or revoked sooner.     Influenza A by PCR NEGATIVE NEGATIVE   Influenza B by PCR NEGATIVE NEGATIVE    Comment: (NOTE) The Xpert Xpress SARS-CoV-2/FLU/RSV plus assay is intended as an aid in the diagnosis of influenza from Nasopharyngeal swab specimens and should not be used as a sole basis for treatment. Nasal washings and aspirates are unacceptable for Xpert Xpress SARS-CoV-2/FLU/RSV testing.  Fact Sheet for Patients: EntrepreneurPulse.com.au  Fact Sheet for Healthcare Providers: IncredibleEmployment.be  This test is not yet approved or cleared by the [place] FDA and has been authorized for detection and/or diagnosis of SARS-CoV-2 by FDA under an Emergency Use Authorization (EUA). This EUA will remain in effect (meaning this test can be used) for the duration of the COVID-19 declaration under Section 564(b)(1) of the Act, 21 U.S.C. section 360bbb-3(b)(1), unless the authorization is terminated or revoked.  Performed at [facility], [place] [address], [place], [place] [address]    DG Chest Port 1 View  Result Date: 01/15/2022 CLINICAL DATA:  Cough. EXAM: PORTABLE CHEST 1 VIEW COMPARISON:  Chest radiograph dated  March 19, 2021 FINDINGS: The heart size and mediastinal contours are within normal limits. TAVR is unchanged. Low lung volumes, without evidence of focal consolidation or  pleural effusion. The visualized skeletal structures are unremarkable. IMPRESSION: Low lung volumes, without evidence of acute cardiopulmonary disease. Electronically Signed   By: Keane Police D.O.   On: 01/15/2022 13:25    Pending Labs Unresulted Labs (From admission, onward)     Start     Ordered   01/15/22 1251  Urinalysis, Routine w reflex microscopic  Once,   URGENT        01/15/22 1250   Signed and Held  CBC with Differential/Platelet  Daily at 5am,   R      Signed and Held   Signed and Held  Comprehensive metabolic panel  Daily at [employer],   R      Signed and Held   Signed and Held  C-reactive protein  Daily at [employer],   R      Signed and Held   Signed and Held  D-dimer, quantitative  Daily at [employer],   R      Signed and Held   Signed and Held  Ferritin  Daily at [employer],   R      Signed and Held            Vitals/Pain Today's Vitals   01/15/22 1430 01/15/22 1515 01/15/22 1615 01/15/22 1630  BP: (!) 161/62 (!) 162/64 (!) 163/61   Pulse: 74 76 75   Resp: '17 13 18   '$ Temp:    98.8 F (37.1 C)  TempSrc:    Oral  SpO2: 95% 96% 92%   Weight:      Height:      PainSc:        Isolation Precautions No active isolations  Medications Medications  nirmatrelvir/ritonavir EUA (PAXLOVID) 3 tablet (has no administration in time range)  sodium chloride 0.9 % bolus 500 mL (0 mLs Intravenous Stopped 01/15/22 1400)    Mobility  Low fall risk   Focused Assessments     R Recommendations: See Admitting Provider Note  Report given to:   Additional Notes:

## 2022-01-16 ENCOUNTER — Encounter (HOSPITAL_COMMUNITY): Payer: Self-pay | Admitting: Student

## 2022-01-16 DIAGNOSIS — Z8249 Family history of ischemic heart disease and other diseases of the circulatory system: Secondary | ICD-10-CM | POA: Diagnosis not present

## 2022-01-16 DIAGNOSIS — Z961 Presence of intraocular lens: Secondary | ICD-10-CM | POA: Diagnosis present

## 2022-01-16 DIAGNOSIS — R531 Weakness: Secondary | ICD-10-CM

## 2022-01-16 DIAGNOSIS — K589 Irritable bowel syndrome without diarrhea: Secondary | ICD-10-CM | POA: Diagnosis present

## 2022-01-16 DIAGNOSIS — Z90710 Acquired absence of both cervix and uterus: Secondary | ICD-10-CM | POA: Diagnosis not present

## 2022-01-16 DIAGNOSIS — U071 COVID-19: Secondary | ICD-10-CM | POA: Diagnosis present

## 2022-01-16 DIAGNOSIS — I10 Essential (primary) hypertension: Secondary | ICD-10-CM

## 2022-01-16 DIAGNOSIS — K5900 Constipation, unspecified: Secondary | ICD-10-CM | POA: Diagnosis not present

## 2022-01-16 DIAGNOSIS — J449 Chronic obstructive pulmonary disease, unspecified: Secondary | ICD-10-CM

## 2022-01-16 DIAGNOSIS — E7800 Pure hypercholesterolemia, unspecified: Secondary | ICD-10-CM

## 2022-01-16 DIAGNOSIS — E039 Hypothyroidism, unspecified: Secondary | ICD-10-CM | POA: Diagnosis present

## 2022-01-16 DIAGNOSIS — Z953 Presence of xenogenic heart valve: Secondary | ICD-10-CM | POA: Diagnosis not present

## 2022-01-16 DIAGNOSIS — Z952 Presence of prosthetic heart valve: Secondary | ICD-10-CM

## 2022-01-16 DIAGNOSIS — M25522 Pain in left elbow: Secondary | ICD-10-CM | POA: Diagnosis present

## 2022-01-16 DIAGNOSIS — Z9841 Cataract extraction status, right eye: Secondary | ICD-10-CM | POA: Diagnosis not present

## 2022-01-16 DIAGNOSIS — I2510 Atherosclerotic heart disease of native coronary artery without angina pectoris: Secondary | ICD-10-CM | POA: Diagnosis present

## 2022-01-16 DIAGNOSIS — Z8669 Personal history of other diseases of the nervous system and sense organs: Secondary | ICD-10-CM | POA: Diagnosis not present

## 2022-01-16 DIAGNOSIS — I129 Hypertensive chronic kidney disease with stage 1 through stage 4 chronic kidney disease, or unspecified chronic kidney disease: Secondary | ICD-10-CM | POA: Diagnosis present

## 2022-01-16 DIAGNOSIS — N182 Chronic kidney disease, stage 2 (mild): Secondary | ICD-10-CM | POA: Diagnosis present

## 2022-01-16 DIAGNOSIS — R42 Dizziness and giddiness: Secondary | ICD-10-CM | POA: Diagnosis not present

## 2022-01-16 DIAGNOSIS — K219 Gastro-esophageal reflux disease without esophagitis: Secondary | ICD-10-CM | POA: Diagnosis present

## 2022-01-16 DIAGNOSIS — Z602 Problems related to living alone: Secondary | ICD-10-CM | POA: Diagnosis present

## 2022-01-16 DIAGNOSIS — Z66 Do not resuscitate: Secondary | ICD-10-CM | POA: Diagnosis present

## 2022-01-16 DIAGNOSIS — E785 Hyperlipidemia, unspecified: Secondary | ICD-10-CM | POA: Diagnosis present

## 2022-01-16 DIAGNOSIS — Z9842 Cataract extraction status, left eye: Secondary | ICD-10-CM | POA: Diagnosis not present

## 2022-01-16 DIAGNOSIS — Z87891 Personal history of nicotine dependence: Secondary | ICD-10-CM | POA: Diagnosis not present

## 2022-01-16 DIAGNOSIS — M8580 Other specified disorders of bone density and structure, unspecified site: Secondary | ICD-10-CM | POA: Diagnosis present

## 2022-01-16 DIAGNOSIS — Z79890 Hormone replacement therapy (postmenopausal): Secondary | ICD-10-CM | POA: Diagnosis not present

## 2022-01-16 DIAGNOSIS — I350 Nonrheumatic aortic (valve) stenosis: Secondary | ICD-10-CM | POA: Diagnosis present

## 2022-01-16 LAB — CBC WITH DIFFERENTIAL/PLATELET
Abs Immature Granulocytes: 0.02 10*3/uL (ref 0.00–0.07)
Basophils Absolute: 0 10*3/uL (ref 0.0–0.1)
Basophils Relative: 0 %
Eosinophils Absolute: 0 10*3/uL (ref 0.0–0.5)
Eosinophils Relative: 0 %
HCT: 39.4 % (ref 36.0–46.0)
Hemoglobin: 12.7 g/dL (ref 12.0–15.0)
Immature Granulocytes: 0 %
Lymphocytes Relative: 16 %
Lymphs Abs: 0.9 10*3/uL (ref 0.7–4.0)
MCH: 30.2 pg (ref 26.0–34.0)
MCHC: 32.2 g/dL (ref 30.0–36.0)
MCV: 93.6 fL (ref 80.0–100.0)
Monocytes Absolute: 0.8 10*3/uL (ref 0.1–1.0)
Monocytes Relative: 13 %
Neutro Abs: 4.2 10*3/uL (ref 1.7–7.7)
Neutrophils Relative %: 71 %
Platelets: 150 10*3/uL (ref 150–400)
RBC: 4.21 MIL/uL (ref 3.87–5.11)
RDW: 13.5 % (ref 11.5–15.5)
WBC: 5.9 10*3/uL (ref 4.0–10.5)
nRBC: 0 % (ref 0.0–0.2)

## 2022-01-16 LAB — COMPREHENSIVE METABOLIC PANEL
ALT: 8 U/L (ref 0–44)
AST: 18 U/L (ref 15–41)
Albumin: 3.2 g/dL — ABNORMAL LOW (ref 3.5–5.0)
Alkaline Phosphatase: 53 U/L (ref 38–126)
Anion gap: 6 (ref 5–15)
BUN: 13 mg/dL (ref 8–23)
CO2: 25 mmol/L (ref 22–32)
Calcium: 8.1 mg/dL — ABNORMAL LOW (ref 8.9–10.3)
Chloride: 108 mmol/L (ref 98–111)
Creatinine, Ser: 0.83 mg/dL (ref 0.44–1.00)
GFR, Estimated: >60 mL/min (ref >60)
Glucose, Bld: 88 mg/dL (ref 70–99)
Potassium: 4.2 mmol/L (ref 3.5–5.1)
Sodium: 139 mmol/L (ref 135–145)
Total Bilirubin: 0.6 mg/dL (ref 0.3–1.2)
Total Protein: 6 g/dL — ABNORMAL LOW (ref 6.5–8.1)

## 2022-01-16 LAB — C-REACTIVE PROTEIN: CRP: 5.1 mg/dL — ABNORMAL HIGH (ref <1.0)

## 2022-01-16 LAB — D-DIMER, QUANTITATIVE: D-Dimer, Quant: 0.63 ug/mL-FEU — ABNORMAL HIGH (ref 0.00–0.50)

## 2022-01-16 LAB — FERRITIN: Ferritin: 103 ng/mL (ref 11–307)

## 2022-01-16 MED ORDER — DONEPEZIL HCL 5 MG PO TABS
5.0000 mg | ORAL_TABLET | Freq: Every day | ORAL | Status: DC
  Administered 2022-01-16 – 2022-01-25 (×10): 5 mg via ORAL
  Filled 2022-01-16 (×10): qty 1

## 2022-01-16 MED ORDER — ADULT MULTIVITAMIN W/MINERALS CH
1.0000 | ORAL_TABLET | Freq: Every day | ORAL | Status: DC
  Administered 2022-01-16 – 2022-01-26 (×11): 1 via ORAL
  Filled 2022-01-16 (×11): qty 1

## 2022-01-16 MED ORDER — AMLODIPINE BESYLATE 5 MG PO TABS
5.0000 mg | ORAL_TABLET | Freq: Every day | ORAL | Status: DC
  Administered 2022-01-16 – 2022-01-26 (×11): 5 mg via ORAL
  Filled 2022-01-16 (×10): qty 1

## 2022-01-16 MED ORDER — ASPIRIN 81 MG PO CHEW
81.0000 mg | CHEWABLE_TABLET | Freq: Every day | ORAL | Status: DC
  Administered 2022-01-16 – 2022-01-25 (×10): 81 mg via ORAL
  Filled 2022-01-16 (×10): qty 1

## 2022-01-16 MED ORDER — TRAMADOL HCL 50 MG PO TABS
50.0000 mg | ORAL_TABLET | Freq: Every morning | ORAL | Status: DC
  Administered 2022-01-16 – 2022-01-26 (×11): 50 mg via ORAL
  Filled 2022-01-16 (×11): qty 1

## 2022-01-16 MED ORDER — MAGNESIUM HYDROXIDE 400 MG/5ML PO SUSP
15.0000 mL | Freq: Every day | ORAL | Status: DC | PRN
  Administered 2022-01-19 – 2022-01-22 (×3): 15 mL via ORAL
  Filled 2022-01-16 (×3): qty 30

## 2022-01-16 MED ORDER — SENNOSIDES-DOCUSATE SODIUM 8.6-50 MG PO TABS
1.0000 | ORAL_TABLET | Freq: Two times a day (BID) | ORAL | Status: DC | PRN
  Administered 2022-01-19 – 2022-01-23 (×3): 1 via ORAL
  Filled 2022-01-16 (×3): qty 1

## 2022-01-16 MED ORDER — VITAMIN C 500 MG PO TABS
500.0000 mg | ORAL_TABLET | Freq: Every day | ORAL | Status: DC
  Administered 2022-01-16 – 2022-01-26 (×11): 500 mg via ORAL
  Filled 2022-01-16 (×11): qty 1

## 2022-01-16 MED ORDER — SODIUM CHLORIDE 0.9 % IV SOLN: INTRAVENOUS | Status: AC

## 2022-01-16 MED ORDER — HYDRALAZINE HCL 25 MG PO TABS
25.0000 mg | ORAL_TABLET | Freq: Four times a day (QID) | ORAL | Status: DC | PRN
  Administered 2022-01-18: 25 mg via ORAL
  Filled 2022-01-16: qty 1

## 2022-01-16 MED ORDER — IPRATROPIUM-ALBUTEROL 20-100 MCG/ACT IN AERS
1.0000 | INHALATION_SPRAY | Freq: Two times a day (BID) | RESPIRATORY_TRACT | Status: DC
  Administered 2022-01-16 – 2022-01-18 (×6): 1 via RESPIRATORY_TRACT
  Filled 2022-01-16: qty 4

## 2022-01-16 MED ORDER — MIRTAZAPINE 15 MG PO TABS
15.0000 mg | ORAL_TABLET | Freq: Every day | ORAL | Status: DC
  Administered 2022-01-16 – 2022-01-25 (×10): 15 mg via ORAL
  Filled 2022-01-16 (×10): qty 1

## 2022-01-16 MED ORDER — LEVOTHYROXINE SODIUM 150 MCG PO TABS
150.0000 ug | ORAL_TABLET | Freq: Every day | ORAL | Status: DC
  Administered 2022-01-17 – 2022-01-26 (×10): 150 ug via ORAL
  Filled 2022-01-16 (×10): qty 1

## 2022-01-16 NOTE — Progress Notes (Signed)
PROGRESS NOTE  Becky Mccall UJW:[id number] DOB: 05-31-1929   PCP: [name], MD  Patient is from: Home.  Lives alone.  Independently ambulates at baseline.  DOA: 01/15/2022 LOS: 0  Chief complaints Chief Complaint  Patient presents with   Weakness     Brief Narrative / Interim history: 88 year old F with PMH of COPD, nonobstructive CAD, severe AS s/p TAVR, hypothyroidism, HTN, HLD and diminished hearing presenting with progressive generalized weakness for 2 weeks, cough and congestion and admitted for generalized weakness and COVID-19 infection.  No oxygen requirement.  CXR without acute finding.  Started on Paxlovid, and admitted.  Subjective: Seen and examined earlier this morning.  No major events overnight of this morning.  Reports feeling weak.  No shortness of breath.  Reports dry cough.  Denies chest pain, GI or UTI symptoms.  Patient's daughter at bedside.  Objective: Vitals:   01/15/22 1803 01/15/22 2200 01/16/22 0200 01/16/22 0600  BP: (!) 158/67 125/68 (!) 156/62 (!) 150/57  Pulse: 72 71 72 73  Resp: '16 18 16 16  '$ Temp: 99.5 F (37.5 C) 97.9 F (36.6 C) 99.3 F (37.4 C) 99.6 F (37.6 C)  TempSrc: Oral Oral Oral Oral  SpO2: 91% 92% 91% 94%  Weight:      Height:        Examination:  GENERAL: No apparent distress.  Nontoxic. HEENT: MMM.  Vision grossly intact.  Diminished hearing. NECK: Supple.  No apparent JVD.  RESP:  No IWOB.  Fair aeration bilaterally. CVS:  RRR. Heart sounds normal.  ABD/GI/GU: BS+. Abd soft, NTND.  MSK/EXT:  Moves extremities. No apparent deformity. No edema.  SKIN: no apparent skin lesion or wound NEURO: Awake, alert and oriented appropriately.  No apparent focal neuro deficit. PSYCH: Calm. Normal affect.   Procedures:  None  Microbiology summarized: COVID-19 PCR nonreactive.  Assessment and plan: Principal Problem:   COVID-19 virus infection Active Problems:   S/P TAVR (transcatheter aortic valve replacement)    Vertigo   Hyperlipidemia   COPD (chronic obstructive pulmonary disease) (HCC)   Hypothyroidism   Severe aortic stenosis   Essential hypertension   CAD (coronary artery disease), native coronary artery   Generalized weakness  COVID 19 Infection: Fully vaccinated.  No shortness of breath but dry cough and generalized weakness.  Not hypoxic.  CXR without acute finding.  Mildly elevated CRP to 5.1.  Dimer within normal for age. -Continue Paxlovid, Combivent and Mucinex -Continue IV fluid hydration -OOB/PT/OT  Generalized weakness: Likely due to the above.  Lives alone.  Independently ambulates at baseline -Management as above -Therapy recommended SNF   Chronic COPD: Stable. -Breathing treatment as above   History of nonobstructive CAD/HLD/TAVR: Stable -Continue home regimen   Essential hypertension: Slightly elevated BP -Continue home amlodipine -P.o. hydralazine as needed   Hypothyroidism -Continue home Synthroid  Cognitive deficit: Stable. -Continue home meds    Body mass index is 28.47 kg/m.          DVT prophylaxis:  enoxaparin (LOVENOX) injection 40 mg Start: 01/15/22 2000  Code Status: DNR/DNI Family Communication: Updated patient's daughter at bedside Level of care: Med-Surg Status is: Observation The patient remains OBS appropriate and will d/c before 2 midnights.   Final disposition: SNF Consultants:  None  Sch Meds:  Scheduled Meds:  amLODipine  5 mg Oral Daily   ascorbic acid  500 mg Oral Daily   aspirin  81 mg Oral QHS   donepezil  5 mg Oral QHS   enoxaparin (LOVENOX)  injection  40 mg Subcutaneous Q24H   Ipratropium-Albuterol  1 puff Inhalation BID   [START ON 01/17/2022] levothyroxine  150 mcg Oral QAC breakfast   mirtazapine  15 mg Oral QHS   multivitamin with minerals  1 tablet Oral Daily   nirmatrelvir/ritonavir EUA (renal dosing)  2 tablet Oral BID   traMADol  50 mg Oral q AM   Continuous Infusions:  sodium chloride 75 mL/hr at  01/16/22 0639   PRN Meds:.chlorpheniramine-HYDROcodone, guaiFENesin-dextromethorphan, hydrALAZINE  Antimicrobials: Anti-infectives (From admission, onward)    Start     Dose/Rate Route Frequency Ordered Stop   01/15/22 1800  nirmatrelvir/ritonavir EUA (PAXLOVID) 3 tablet  Status:  Discontinued        3 tablet Oral 2 times daily 01/15/22 1533 01/15/22 1710   01/15/22 1800  nirmatrelvir/ritonavir EUA (renal dosing) (PAXLOVID) 2 tablet        2 tablet Oral 2 times daily 01/15/22 1710 01/20/22 2159        I have personally reviewed the following labs and images: CBC: Recent Labs  Lab 01/15/22 1308 01/16/22 0616  WBC 6.8 5.9  NEUTROABS 4.9 4.2  HGB 14.8 12.7  HCT 45.1 39.4  MCV 94.2 93.6  PLT 148* 150   BMP &GFR Recent Labs  Lab 01/15/22 1308 01/16/22 0616  NA 141 139  K 4.5 4.2  CL 107 108  CO2 26 25  GLUCOSE 120* 88  BUN 19 13  CREATININE 1.08* 0.83  CALCIUM 8.9 8.1*   Estimated Creatinine Clearance: 48.1 mL/min (by C-G formula based on SCr of 0.83 mg/dL). Liver & Pancreas: Recent Labs  Lab 01/15/22 1308 01/16/22 0616  AST 28 18  ALT 11 8  ALKPHOS 72 53  BILITOT 0.7 0.6  PROT 7.6 6.0*  ALBUMIN 3.9 3.2*   No results for input(s): "LIPASE", "AMYLASE" in the last 168 hours. No results for input(s): "AMMONIA" in the last 168 hours. Diabetic: No results for input(s): "HGBA1C" in the last 72 hours. No results for input(s): "GLUCAP" in the last 168 hours. Cardiac Enzymes: Recent Labs  Lab 01/15/22 1308  CKTOTAL 64   No results for input(s): "PROBNP" in the last 8760 hours. Coagulation Profile: No results for input(s): "INR", "PROTIME" in the last 168 hours. Thyroid Function Tests: No results for input(s): "TSH", "T4TOTAL", "FREET4", "T3FREE", "THYROIDAB" in the last 72 hours. Lipid Profile: No results for input(s): "CHOL", "HDL", "LDLCALC", "TRIG", "CHOLHDL", "LDLDIRECT" in the last 72 hours. Anemia Panel: Recent Labs    01/16/22 0616  FERRITIN 103    Urine analysis:    Component Value Date/Time   COLORURINE STRAW (A) 08/26/2020 0719   APPEARANCEUR CLEAR 08/26/2020 0719   LABSPEC 1.008 08/26/2020 0719   PHURINE 9.0 (H) 08/26/2020 0719   GLUCOSEU NEGATIVE 08/26/2020 0719   HGBUR NEGATIVE 08/26/2020 0719   BILIRUBINUR NEGATIVE 08/26/2020 0719   KETONESUR NEGATIVE 08/26/2020 0719   PROTEINUR NEGATIVE 08/26/2020 0719   UROBILINOGEN 0.2 05/04/2013 0328   NITRITE NEGATIVE 08/26/2020 0719   LEUKOCYTESUR NEGATIVE 08/26/2020 0719   Sepsis Labs: Invalid input(s): "PROCALCITONIN", "LACTICIDVEN"  Microbiology: Recent Results (from the past 240 hour(s))  Resp Panel by RT-PCR (Flu A&B, Covid) Anterior Nasal Swab     Status: Abnormal   Collection Time: 01/15/22  2:08 PM   Specimen: Anterior Nasal Swab  Result Value Ref Range Status   SARS Coronavirus 2 by RT PCR POSITIVE (A) NEGATIVE Final    Comment: (NOTE) SARS-CoV-2 target nucleic acids are DETECTED.  The SARS-CoV-2 RNA is generally  detectable in upper respiratory specimens during the acute phase of infection. Positive results are indicative of the presence of the identified virus, but do not rule out bacterial infection or co-infection with other pathogens not detected by the test. Clinical correlation with patient history and other diagnostic information is necessary to determine patient infection status. The expected result is Negative.  Fact Sheet for Patients: EntrepreneurPulse.com.au  Fact Sheet for Healthcare Providers: IncredibleEmployment.be  This test is not yet approved or cleared by the [place] FDA and  has been authorized for detection and/or diagnosis of SARS-CoV-2 by FDA under an Emergency Use Authorization (EUA).  This EUA will remain in effect (meaning this test can be used) for the duration of  the COVID-19 declaration under Section 564(b)(1) of the A ct, 21 U.S.C. section 360bbb-3(b)(1), unless the authorization  is terminated or revoked sooner.     Influenza A by PCR NEGATIVE NEGATIVE Final   Influenza B by PCR NEGATIVE NEGATIVE Final    Comment: (NOTE) The Xpert Xpress SARS-CoV-2/FLU/RSV plus assay is intended as an aid in the diagnosis of influenza from Nasopharyngeal swab specimens and should not be used as a sole basis for treatment. Nasal washings and aspirates are unacceptable for Xpert Xpress SARS-CoV-2/FLU/RSV testing.  Fact Sheet for Patients: EntrepreneurPulse.com.au  Fact Sheet for Healthcare Providers: IncredibleEmployment.be  This test is not yet approved or cleared by the [place] FDA and has been authorized for detection and/or diagnosis of SARS-CoV-2 by FDA under an Emergency Use Authorization (EUA). This EUA will remain in effect (meaning this test can be used) for the duration of the COVID-19 declaration under Section 564(b)(1) of the Act, 21 U.S.C. section 360bbb-3(b)(1), unless the authorization is terminated or revoked.  Performed at [facility], [place] [address] [place], [place] [address]     Radiology Studies: [facility] Chest Port 1 View  Result Date: 01/15/2022 CLINICAL DATA:  Cough. EXAM: PORTABLE CHEST 1 VIEW COMPARISON:  Chest radiograph dated March 19, 2021 FINDINGS: The heart size and mediastinal contours are within normal limits. TAVR is unchanged. Low lung volumes, without evidence of focal consolidation or pleural effusion. The visualized skeletal structures are unremarkable. IMPRESSION: Low lung volumes, without evidence of acute cardiopulmonary disease. Electronically Signed   By: Keane Police D.O.   On: 01/15/2022 13:25      Ahliyah Nienow T. Seabrook  If 7PM-7AM, please contact night-coverage [email or web address] 01/16/2022, 11:56 AM

## 2022-01-16 NOTE — Social Work (Signed)
Fl2 sent out to facilities.

## 2022-01-16 NOTE — Evaluation (Signed)
Physical Therapy Evaluation Patient Details Name: RAMI WADDLE MRN: [id number] DOB: [birth year] Today's Date: 01/16/2022  History of Present Illness  Patient is a 88 year old female who presented with cough and generalized weakness. Patient was found to have COVID 19. PMH: COPD, GERD, HTN, HLD, vertigo   Clinical Impression  Pt admitted with above diagnosis. Pt from home, son in law assisting her for 3 hours in morning and caregiver comes for 3 hours in afternoon, pt able to get in/out of lift chair at slightly elevated height and use RW while home alone, difficulty lifting R foot compared to L foot for "awhile" per daughter. Pt with varying assistance needed for STS, mod to min A, heavy cues to shift weight anterior and for hand placement with rising and lowering. Pt limited to 8 ft with RW, min A+2 with recliner follow, pt fatigues easily. Pt declines falls, but very fearful of falling, needing increased cues to shift weight anterior with transfers and to continue ambulating with RW and assistance. Family reports prior success with [facility] SN[facility] in the past and unable to provide additional assistance due to they still work. Recommending ST SNF prior to return home with family and caregivers checking on pt as needed. Pt currently with functional limitations due to the deficits listed below (see PT Problem List). Pt will benefit from skilled PT to increase their independence and safety with mobility to allow discharge to the venue listed below.          Recommendations for follow up therapy are one component of a multi-disciplinary discharge planning process, led by the attending physician.  Recommendations may be updated based on patient status, additional functional criteria and insurance authorization.  Follow Up Recommendations Skilled nursing-short term rehab (<3 hours/day) Can patient physically be transported by private vehicle: Yes    Assistance Recommended at Discharge Frequent or constant  Supervision/Assistance  Patient can return home with the following  A little help with walking and/or transfers;A little help with bathing/dressing/bathroom;Assistance with cooking/housework;Assist for transportation;Help with stairs or ramp for entrance    Equipment Recommendations None recommended by PT  Recommendations for Other Services       Functional Status Assessment Patient has had a recent decline in their functional status and demonstrates the ability to make significant improvements in function in a reasonable and predictable amount of time.     Precautions / Restrictions Precautions Precautions: Fall Precaution Comments: HOH speak on L side Restrictions Weight Bearing Restrictions: No      Mobility  Bed Mobility  General bed mobility comments: seated EOB with OT upon arrival    Transfers Overall transfer level: Needs assistance Equipment used: Rolling walker (2 wheels) Transfers: Sit to/from Stand, Bed to chair/wheelchair/BSC Sit to Stand: Mod assist, Min assist  Step pivot transfers: Min assist, +2 safety/equipment  General transfer comment: mod A with initial power to stand, heavy cues for weight shift anterior over toes, min A+2 for pivot step over to recliner, heavy cues and assist with RW management    Ambulation/Gait Ambulation/Gait assistance: Min assist, +2 safety/equipment Gait Distance (Feet): 8 Feet Assistive device: Rolling walker (2 wheels) Gait Pattern/deviations: Step-to pattern, Decreased stride length, Shuffle, Narrow base of support Gait velocity: decreased  General Gait Details: slow, short, shuffling steps, more difficulty lifting R foot compared to L, narrow BOS, min A +2 with recliner following, limited by fatigue  Stairs            Wheelchair Mobility    Modified Rankin (  Stroke Patients Only)       Balance Overall balance assessment: Needs assistance Sitting-balance support: Feet supported Sitting balance-Leahy Scale:  Fair Sitting balance - Comments: seated EOB   Standing balance support: During functional activity, Bilateral upper extremity supported, Reliant on assistive device for balance Standing balance-Leahy Scale: Poor       Pertinent Vitals/Pain Pain Assessment Pain Assessment: No/denies pain    Home Living Family/patient expects to be discharged to:: Private residence Living Arrangements: Alone Available Help at Discharge: Family;Available PRN/intermittently (caregiver 3 hours PM and son in law 3 hours AM) Type of Home: House Home Access: Stairs to enter Entrance Stairs-Rails: Right Entrance Stairs-Number of Steps: 1 step in back   Home Layout: One level Home Equipment: Conservation officer, nature (2 wheels);BSC/3in1;Shower seat      Prior Function Prior Level of Function : Needs assist  Mobility Comments: pt sleeps in lift chair, doesn't raise lift chair fully up due to fear of falling, using RW in the home ADLs Comments: patient had assistance for LB dressing from family. daughter in room reported that patient was able to complete toileting and dressing prior to son in law arrival in the AM normally.     Hand Dominance   Dominant Hand: Right    Extremity/Trunk Assessment   Upper Extremity Assessment Upper Extremity Assessment: Defer to OT evaluation    Lower Extremity Assessment Lower Extremity Assessment: Generalized weakness (AROM WFL, strength grossly 3+/5, denies numbness/tingling throughout BLE)       Communication   Communication: HOH  Cognition Arousal/Alertness: Awake/alert Behavior During Therapy: WFL for tasks assessed/performed Overall Cognitive Status: Within Functional Limits for tasks assessed  General Comments: pt needing repeat cues, very HOH        General Comments      Exercises     Assessment/Plan    PT Assessment Patient needs continued PT services  PT Problem List Decreased strength;Decreased activity tolerance;Decreased balance;Decreased  mobility;Cardiopulmonary status limiting activity       PT Treatment Interventions DME instruction;Gait training;Functional mobility training;Therapeutic activities;Therapeutic exercise;Balance training;Patient/family education    PT Goals (Current goals can be found in the Care Plan section)  Acute Rehab PT Goals Patient Stated Goal: get stronger PT Goal Formulation: With patient/family Time For Goal Achievement: 01/30/22 Potential to Achieve Goals: Good    Frequency Min 2X/week     Co-evaluation PT/OT/SLP Co-Evaluation/Treatment: Yes Reason for Co-Treatment: To address functional/ADL transfers;For patient/therapist safety PT goals addressed during session: Mobility/safety with mobility;Balance;Proper use of DME OT goals addressed during session: ADL's and self-care       AM-PAC PT "6 Clicks" Mobility  Outcome Measure Help needed turning from your back to your side while in a flat bed without using bedrails?: A Lot Help needed moving from lying on your back to sitting on the side of a flat bed without using bedrails?: A Lot Help needed moving to and from a bed to a chair (including a wheelchair)?: A Lot Help needed standing up from a chair using your arms (e.g., wheelchair or bedside chair)?: A Lot Help needed to walk in hospital room?: Total Help needed climbing 3-5 steps with a railing? : Total 6 Click Score: 10    End of Session Equipment Utilized During Treatment: Gait belt Activity Tolerance: Patient tolerated treatment well Patient left: in chair;with call bell/phone within reach;with chair alarm set;with family/visitor present Nurse Communication: Mobility status PT Visit Diagnosis: Unsteadiness on feet (R26.81);Other abnormalities of gait and mobility (R26.89);Muscle weakness (generalized) (M62.81)    Time:  9326-7124 PT Time Calculation (min) (ACUTE ONLY): 30 min   Charges:   PT Evaluation $PT Eval Moderate Complexity: 1 Mod          Tori Marlaya Turck PT,  DPT 01/16/22, 10:59 AM

## 2022-01-16 NOTE — NC FL2 (Signed)
[place] LEVEL OF CARE SCREENING TOOL     IDENTIFICATION  Patient Name: Becky Mccall RECORD Birthdate: Apr 18, 1929 Sex: female Admission Date (Current Location): 01/15/2022  [facility] and [place] Number:  Herbalist and Address:  [facility],  [place] [place], [place]      Provider Number: [id number]  Attending Physician Name and Address:  Mercy Riding, MD  Relative Name and Phone Number:  nuffer,Tamara  Daughter  [phone]    Current Level of Care: Hospital Recommended Level of Care: [place] Prior Approval Number:    Date Approved/Denied: 01/16/22 PASRR Number: [id number]  Discharge Plan: SNF    Current Diagnoses: Patient Active Problem List   Diagnosis Date Noted   COVID-19 virus infection 01/15/2022   Generalized weakness 01/15/2022   COVID-19 01/15/2022   DDD (degenerative disc disease), lumbar 07/04/2020   OSA (obstructive sleep apnea) 07/04/2020   Status post total left knee replacement 04/11/2018   CAD (coronary artery disease), native coronary artery 01/24/2018   PVC's (premature ventricular contractions) 10/11/2017   Leg cramps 10/11/2017   Essential hypertension 10/10/2017   Chronic pain of right knee 03/17/2017   Unilateral primary osteoarthritis, left knee 03/17/2017   Unilateral primary osteoarthritis, right knee 03/17/2017   Chronic pain of left knee 02/07/2017   S/P TAVR (transcatheter aortic valve replacement) 12/07/2016   Severe aortic stenosis 12/07/2016   Fibrocystic breast disease    IBS (irritable bowel syndrome)    Cervical spondylolysis    Osteopenia    Vitamin D deficiency    Squamous cell skin cancer    Carotid stenosis    Colon polyp    Vertigo 05/04/2013   Hard of hearing    Hyperlipidemia    COPD (chronic obstructive pulmonary disease) (HCC)    Hypothyroidism    GERD (gastroesophageal reflux disease)    Spinal stenosis in cervical region 02/19/2013     Orientation RESPIRATION BLADDER Height & Weight     Self, Time, Situation, Place  Normal Continent Weight: 176 lb 5.9 oz (80 kg) Height:  '5\' 6"'$  (167.6 cm)  BEHAVIORAL SYMPTOMS/MOOD NEUROLOGICAL BOWEL NUTRITION STATUS      Continent    AMBULATORY STATUS COMMUNICATION OF NEEDS Skin   Limited Assist Verbally Normal                       Personal Care Assistance Level of Assistance  Bathing, Feeding Bathing Assistance: Limited assistance Feeding assistance: Limited assistance       Functional Limitations Info  Sight, Hearing, Speech Sight Info: Adequate Hearing Info: Adequate Speech Info: Adequate    SPECIAL CARE FACTORS FREQUENCY                       Contractures Contractures Info: Not present    Additional Factors Info  Code Status, Allergies Code Status Info: DNR Allergies Info: Tape High Allergy Rash, Other (See Comments) Developed blisters from clear tape used over cath site 10/2016 Bacitra-neomycin-polymyxin-hc Medium  Rash  Bacitracin-polymyxin B Medium Allergy Rash  Lisinopril Medium  Cough  5-alpha Reductase Inhibitors Not Specified  Other (See Comments) Muscle pain Ezetimibe Not Specified  Other (See Comments) Muscle pain Statins Not Specified  Other (See Comments) Muscle pain Welchol (colesevelam) Not Specified  Other (See Comments) Muscle pain Nabumetone Low  Other (See Comments) CHEST PAIN Neosporin (neomycin-bacitracin Zn-polymyx) Low  Other (See Comments) Cause wound/scratch to worsen Zinc Oxide Low  Current Medications (01/16/2022):  This is the current hospital active medication list Current Facility-Administered Medications  Medication Dose Route Frequency Provider Last Rate Last Admin   0.9 %  sodium chloride infusion   Intravenous Continuous Gonfa, Taye T, MD       amLODipine (NORVASC) tablet 5 mg  5 mg Oral Daily Wendee Beavers T, MD   5 mg at 01/16/22 1132   ascorbic acid (VITAMIN C) tablet 500 mg  500 mg Oral Daily Wendee Beavers T,  MD   500 mg at 01/16/22 3614   aspirin chewable tablet 81 mg  81 mg Oral QHS Gonfa, Taye T, MD       chlorpheniramine-HYDROcodone (TUSSIONEX) 10-8 MG/5ML suspension 5 mL  5 mL Oral Q12H PRN Marylyn Ishihara, Tyrone A, DO   5 mL at 01/16/22 0849   donepezil (ARICEPT) tablet 5 mg  5 mg Oral QHS Gonfa, Taye T, MD       enoxaparin (LOVENOX) injection 40 mg  40 mg Subcutaneous Q24H Kyle, Tyrone A, DO   40 mg at 01/15/22 2044   guaiFENesin-dextromethorphan (ROBITUSSIN DM) 100-10 MG/5ML syrup 10 mL  10 mL Oral Q4H PRN Marylyn Ishihara, Tyrone A, DO       hydrALAZINE (APRESOLINE) injection 10 mg  10 mg Intravenous Q8H PRN Kyle, Tyrone A, DO       Ipratropium-Albuterol (COMBIVENT) respimat 1 puff  1 puff Inhalation BID Kyle, Tyrone A, DO   1 puff at 01/16/22 0845   [START ON 01/17/2022] levothyroxine (SYNTHROID) tablet 150 mcg  150 mcg Oral QAC breakfast Gonfa, Taye T, MD       mirtazapine (REMERON) tablet 15 mg  15 mg Oral QHS Gonfa, Taye T, MD       multivitamin with minerals tablet 1 tablet  1 tablet Oral Daily Mercy Riding, MD   1 tablet at 01/16/22 0837   nirmatrelvir/ritonavir EUA (renal dosing) (PAXLOVID) 2 tablet  2 tablet Oral BID Minda Ditto, RPH   2 tablet at 01/16/22 0839   traMADol (ULTRAM) tablet 50 mg  50 mg Oral q AM Mercy Riding, MD   50 mg at 01/16/22 4315     Discharge Medications: Please see discharge summary for a list of discharge medications.  Relevant Imaging Results:  Relevant Lab Results:   Additional Information [place] Roshanna Cimino LCSW-A SSI # [id number]  Rodney Booze, [place]

## 2022-01-16 NOTE — Evaluation (Signed)
Occupational Therapy Evaluation Patient Details Name: Becky Mccall MRN: [id number] DOB: 24-Oct-1929 Today's Date: 01/16/2022   History of Present Illness Patient is a 88 year old female who presented with cough and generalized weakness. Patient was found to have COVID 19. PMH: COPD, GERD, HTN, HLD, vertigo   Clinical Impression   Patient is a 88 year old female who was admitted for above. Patient was living at home alone with 3 hours of support in AM and in PM from family/caregiver. Patients daughter present in room for session. Patient's daughter reported they would provide needed support at time of d/c. Patient was noted to have decreased functional activity tolerance, decreased endurance, decreased standing balance, decreased safety awareness, and decreased knowledge of AD/AE impacting participation in ADLs. Patient would continue to benefit from skilled OT services at this time while admitted and after d/c to address noted deficits in order to improve overall safety and independence in ADLs.         Recommendations for follow up therapy are one component of a multi-disciplinary discharge planning process, led by the attending physician.  Recommendations may be updated based on patient status, additional functional criteria and insurance authorization.   Follow Up Recommendations  Home health OT     Assistance Recommended at Discharge Frequent or constant Supervision/Assistance  Patient can return home with the following A little help with walking and/or transfers;A lot of help with bathing/dressing/bathroom;Assistance with cooking/housework;Direct supervision/assist for medications management;Assist for transportation;Help with stairs or ramp for entrance;Direct supervision/assist for financial management    Functional Status Assessment  Patient has had a recent decline in their functional status and demonstrates the ability to make significant improvements in function in a  reasonable and predictable amount of time.  Equipment Recommendations  None recommended by OT    Recommendations for Other Services       Precautions / Restrictions Precautions Precaution Comments: HOH speak on L side Restrictions Weight Bearing Restrictions: No      Mobility Bed Mobility Overal bed mobility: Needs Assistance Bed Mobility: Supine to Sit     Supine to sit: Mod assist     General bed mobility comments: with increased time and cues for proper hand placement to assist with transition to midline on EOB    Transfers                          Balance                                           ADL either performed or assessed with clinical judgement   ADL Overall ADL's : Needs assistance/impaired Eating/Feeding: Set up;Sitting   Grooming: Set up;Sitting   Upper Body Bathing: Minimal assistance;Sitting   Lower Body Bathing: Moderate assistance;Sitting/lateral leans   Upper Body Dressing : Min guard;Sitting   Lower Body Dressing: Moderate assistance;Sitting/lateral leans   Toilet Transfer: Minimal assistance;Ambulation;Rolling walker (2 wheels);+2 for safety/equipment Toilet Transfer Details (indicate cue type and reason): with increased time. patient was able to clear BLE off the floor with use of RW. uses RW at baseline Toileting- Water quality scientist and Hygiene: Maximal assistance;Sit to/from stand Toileting - Clothing Manipulation Details (indicate cue type and reason): patient had pure wic in place during session with continuous voiding of bladder during session. patient declined to sit on [facility] at this time.  Vision Patient Visual Report: No change from baseline       Perception     Praxis      Pertinent Vitals/Pain Pain Assessment Pain Assessment: No/denies pain     Hand Dominance Right   Extremity/Trunk Assessment Upper Extremity Assessment Upper Extremity Assessment: Generalized weakness    Lower Extremity Assessment Lower Extremity Assessment: Defer to PT evaluation       Communication Communication Communication: HOH   Cognition Arousal/Alertness: Awake/alert Behavior During Therapy: WFL for tasks assessed/performed Overall Cognitive Status: Difficult to assess                                       General Comments       Exercises     Shoulder Instructions      Home Living Family/patient expects to be discharged to:: Private residence Living Arrangements: Alone Available Help at Discharge: Family (caregiver 3 hours PM and sone in law 3 hours AM)   Home Access: Stairs to enter [employer] of Steps: 1 step in back Entrance Stairs-Rails: Right Home Layout: One level     Bathroom Shower/Tub: Tub/shower unit         Home Equipment: Conservation officer, nature (2 wheels);BSC/3in1;Shower seat          Prior Functioning/Environment Prior Level of Function : Needs assist             Mobility Comments: patient sleeps in lift chair and uses RW for functional mobility. ADLs Comments: patient had assistance for LB dressing from family. daughter in room reported that patient was able to complete toileting and dressing prior to son in law arrival in the AM normally.        OT Problem List: Impaired balance (sitting and/or standing);Decreased safety awareness;Decreased activity tolerance      OT Treatment/Interventions: Self-care/ADL training;Energy conservation;Balance training;Therapeutic exercise;DME and/or AE instruction;Neuromuscular education;Therapeutic activities;Patient/family education    OT Goals(Current goals can be found in the care plan section) Acute Rehab OT Goals Patient Stated Goal: to get better OT Goal Formulation: With patient/family Time For Goal Achievement: 01/30/22 Potential to Achieve Goals: Fair  OT Frequency: Min 2X/week    Co-evaluation PT/OT/SLP Co-Evaluation/Treatment: Yes Reason for Co-Treatment: To  address functional/ADL transfers;For patient/therapist safety PT goals addressed during session: Mobility/safety with mobility OT goals addressed during session: ADL's and self-care      AM-PAC OT "6 Clicks" Daily Activity     Outcome Measure Help from another person eating meals?: A Little Help from another person taking care of personal grooming?: A Little Help from another person toileting, which includes using toliet, bedpan, or urinal?: A Lot Help from another person bathing (including washing, rinsing, drying)?: A Lot Help from another person to put on and taking off regular upper body clothing?: A Little Help from another person to put on and taking off regular lower body clothing?: A Lot 6 Click Score: 15   End of Session Equipment Utilized During Treatment: Rolling walker (2 wheels) Nurse Communication: Mobility status  Activity Tolerance: Patient tolerated treatment well Patient left: in chair;with call bell/phone within reach;with chair alarm set  OT Visit Diagnosis: Unsteadiness on feet (R26.81);Other abnormalities of gait and mobility (R26.89)                Time: 4132-4401 OT Time Calculation (min): 28 min Charges:  OT General Charges $OT Visit: 1 Visit OT Evaluation $OT Eval Moderate Complexity: 1  Mod  Rennie Plowman, MS Acute Rehabilitation Department Office# [phone]   Willa Rough 01/16/2022, 9:59 AM

## 2022-01-17 DIAGNOSIS — U071 COVID-19: Secondary | ICD-10-CM | POA: Diagnosis not present

## 2022-01-17 DIAGNOSIS — I2510 Atherosclerotic heart disease of native coronary artery without angina pectoris: Secondary | ICD-10-CM | POA: Diagnosis not present

## 2022-01-17 DIAGNOSIS — E039 Hypothyroidism, unspecified: Secondary | ICD-10-CM | POA: Diagnosis not present

## 2022-01-17 DIAGNOSIS — Z952 Presence of prosthetic heart valve: Secondary | ICD-10-CM | POA: Diagnosis not present

## 2022-01-17 NOTE — Plan of Care (Signed)

## 2022-01-17 NOTE — Progress Notes (Signed)
PROGRESS NOTE  Becky Mccall JWJ:[id number] DOB: 1929-04-26   PCP: [name], MD  Patient is from: Home.  Lives alone.  Ambulates with rolling walker at baseline.  DOA: 01/15/2022 LOS: 1  Chief complaints Chief Complaint  Patient presents with   Weakness     Brief Narrative / Interim history: 88 year old F with PMH of COPD, nonobstructive CAD, severe AS s/p TAVR, hypothyroidism, HTN, HLD and diminished hearing presenting with progressive generalized weakness for 2 weeks, cough and congestion and admitted for generalized weakness and COVID-19 infection.  No oxygen requirement.  CXR without acute finding.  Started on Paxlovid, and admitted.  Stable from respiratory standpoint.  Generalized weakness.  Therapy recommended SNF.  Subjective: Seen and examined earlier this morning.  No major events overnight of this morning.  No shortness of breath but dry cough and weakness.  Denies GI or UTI symptoms.  Daughter at bedside.  Objective: Vitals:   01/16/22 1440 01/16/22 2101 01/16/22 2127 01/17/22 0444  BP: (!) 144/78  (!) 140/60 135/63  Pulse: 69  70 83  Resp: '17  18 18  '$ Temp: 98.7 F (37.1 C)  99.5 F (37.5 C) 99.3 F (37.4 C)  TempSrc: Oral  Oral Oral  SpO2: 93% 94% 94% 96%  Weight:      Height:        Examination:  GENERAL: No apparent distress.  Nontoxic. HEENT: MMM.  Vision grossly intact.  Diminished hearing. NECK: Supple.  No apparent JVD.  RESP:  No IWOB.  Fair aeration bilaterally. CVS:  RRR. Heart sounds normal.  ABD/GI/GU: BS+. Abd soft, NTND.  MSK/EXT:  Moves extremities. No apparent deformity. No edema.  SKIN: no apparent skin lesion or wound NEURO: Awake and alert. Oriented appropriately.  No apparent focal neuro deficit. PSYCH: Calm. Normal affect.   Procedures:  None  Microbiology summarized: COVID-19 PCR nonreactive.  Assessment and plan: Principal Problem:   COVID-19 virus infection Active Problems:   S/P TAVR (transcatheter aortic  valve replacement)   Vertigo   Hyperlipidemia   COPD (chronic obstructive pulmonary disease) (HCC)   Hypothyroidism   Severe aortic stenosis   Essential hypertension   CAD (coronary artery disease), native coronary artery   Generalized weakness  COVID 19 Infection: Fully vaccinated.  No shortness of breath but dry cough and generalized weakness.  Not hypoxic.  CXR without acute finding.  Mildly elevated CRP to 5.1.  Dimer within normal for age. -Continue Paxlovid, Combivent and Mucinex -Continue IV fluid hydration -OOB/PT/OT  Generalized weakness: Likely due to the above.  Lives alone.  Independently ambulates at baseline -Management as above -Therapy recommended SNF   Chronic COPD: Stable. -Breathing treatment as above   History of nonobstructive CAD/HLD/TAVR: Stable -Continue home regimen   Essential hypertension: Normotensive. -Continue home amlodipine -P.o. hydralazine as needed   Hypothyroidism -Continue home Synthroid  Cognitive deficit/diminished hearing: Stable. -Continue home meds    Body mass index is 28.47 kg/m.          DVT prophylaxis:  enoxaparin (LOVENOX) injection 40 mg Start: 01/15/22 2000  Code Status: DNR/DNI Family Communication: Updated patient's daughter at bedside Level of care: Med-Surg Status is: Inpatient The patient will remain to inpatient because: Generalized weakness in the setting of COVID-19 infection awaiting SNF placement   Final disposition: SNF Consultants:  None  Sch Meds:  Scheduled Meds:  amLODipine  5 mg Oral Daily   ascorbic acid  500 mg Oral Daily   aspirin  81 mg Oral QHS  donepezil  5 mg Oral QHS   enoxaparin (LOVENOX) injection  40 mg Subcutaneous Q24H   Ipratropium-Albuterol  1 puff Inhalation BID   levothyroxine  150 mcg Oral QAC breakfast   mirtazapine  15 mg Oral QHS   multivitamin with minerals  1 tablet Oral Daily   nirmatrelvir/ritonavir EUA (renal dosing)  2 tablet Oral BID   traMADol  50 mg  Oral q AM   Continuous Infusions:   PRN Meds:.guaiFENesin-dextromethorphan, hydrALAZINE, magnesium hydroxide, senna-docusate  Antimicrobials: Anti-infectives (From admission, onward)    Start     Dose/Rate Route Frequency Ordered Stop   01/15/22 1800  nirmatrelvir/ritonavir EUA (PAXLOVID) 3 tablet  Status:  Discontinued        3 tablet Oral 2 times daily 01/15/22 1533 01/15/22 1710   01/15/22 1800  nirmatrelvir/ritonavir EUA (renal dosing) (PAXLOVID) 2 tablet        2 tablet Oral 2 times daily 01/15/22 1710 01/20/22 2159        I have personally reviewed the following labs and images: CBC: Recent Labs  Lab 01/15/22 1308 01/16/22 0616  WBC 6.8 5.9  NEUTROABS 4.9 4.2  HGB 14.8 12.7  HCT 45.1 39.4  MCV 94.2 93.6  PLT 148* 150   BMP &GFR Recent Labs  Lab 01/15/22 1308 01/16/22 0616  NA 141 139  K 4.5 4.2  CL 107 108  CO2 26 25  GLUCOSE 120* 88  BUN 19 13  CREATININE 1.08* 0.83  CALCIUM 8.9 8.1*   Estimated Creatinine Clearance: 48.1 mL/min (by C-G formula based on SCr of 0.83 mg/dL). Liver & Pancreas: Recent Labs  Lab 01/15/22 1308 01/16/22 0616  AST 28 18  ALT 11 8  ALKPHOS 72 53  BILITOT 0.7 0.6  PROT 7.6 6.0*  ALBUMIN 3.9 3.2*   No results for input(s): "LIPASE", "AMYLASE" in the last 168 hours. No results for input(s): "AMMONIA" in the last 168 hours. Diabetic: No results for input(s): "HGBA1C" in the last 72 hours. No results for input(s): "GLUCAP" in the last 168 hours. Cardiac Enzymes: Recent Labs  Lab 01/15/22 1308  CKTOTAL 64   No results for input(s): "PROBNP" in the last 8760 hours. Coagulation Profile: No results for input(s): "INR", "PROTIME" in the last 168 hours. Thyroid Function Tests: No results for input(s): "TSH", "T4TOTAL", "FREET4", "T3FREE", "THYROIDAB" in the last 72 hours. Lipid Profile: No results for input(s): "CHOL", "HDL", "LDLCALC", "TRIG", "CHOLHDL", "LDLDIRECT" in the last 72 hours. Anemia Panel: Recent Labs     01/16/22 0616  FERRITIN 103   Urine analysis:    Component Value Date/Time   COLORURINE STRAW (A) 08/26/2020 0719   APPEARANCEUR CLEAR 08/26/2020 0719   LABSPEC 1.008 08/26/2020 0719   PHURINE 9.0 (H) 08/26/2020 0719   GLUCOSEU NEGATIVE 08/26/2020 0719   HGBUR NEGATIVE 08/26/2020 0719   BILIRUBINUR NEGATIVE 08/26/2020 0719   KETONESUR NEGATIVE 08/26/2020 0719   PROTEINUR NEGATIVE 08/26/2020 0719   UROBILINOGEN 0.2 05/04/2013 0328   NITRITE NEGATIVE 08/26/2020 0719   LEUKOCYTESUR NEGATIVE 08/26/2020 0719   Sepsis Labs: Invalid input(s): "PROCALCITONIN", "LACTICIDVEN"  Microbiology: Recent Results (from the past 240 hour(s))  Resp Panel by RT-PCR (Flu A&B, Covid) Anterior Nasal Swab     Status: Abnormal   Collection Time: 01/15/22  2:08 PM   Specimen: Anterior Nasal Swab  Result Value Ref Range Status   SARS Coronavirus 2 by RT PCR POSITIVE (A) NEGATIVE Final    Comment: (NOTE) SARS-CoV-2 target nucleic acids are DETECTED.  The SARS-CoV-2 RNA is  generally detectable in upper respiratory specimens during the acute phase of infection. Positive results are indicative of the presence of the identified virus, but do not rule out bacterial infection or co-infection with other pathogens not detected by the test. Clinical correlation with patient history and other diagnostic information is necessary to determine patient infection status. The expected result is Negative.  Fact Sheet for Patients: EntrepreneurPulse.com.au  Fact Sheet for Healthcare Providers: IncredibleEmployment.be  This test is not yet approved or cleared by the [place] FDA and  has been authorized for detection and/or diagnosis of SARS-CoV-2 by FDA under an Emergency Use Authorization (EUA).  This EUA will remain in effect (meaning this test can be used) for the duration of  the COVID-19 declaration under Section 564(b)(1) of the A ct, 21 U.S.C. section  360bbb-3(b)(1), unless the authorization is terminated or revoked sooner.     Influenza A by PCR NEGATIVE NEGATIVE Final   Influenza B by PCR NEGATIVE NEGATIVE Final    Comment: (NOTE) The Xpert Xpress SARS-CoV-2/FLU/RSV plus assay is intended as an aid in the diagnosis of influenza from Nasopharyngeal swab specimens and should not be used as a sole basis for treatment. Nasal washings and aspirates are unacceptable for Xpert Xpress SARS-CoV-2/FLU/RSV testing.  Fact Sheet for Patients: EntrepreneurPulse.com.au  Fact Sheet for Healthcare Providers: IncredibleEmployment.be  This test is not yet approved or cleared by the [place] FDA and has been authorized for detection and/or diagnosis of SARS-CoV-2 by FDA under an Emergency Use Authorization (EUA). This EUA will remain in effect (meaning this test can be used) for the duration of the COVID-19 declaration under Section 564(b)(1) of the Act, 21 U.S.C. section 360bbb-3(b)(1), unless the authorization is terminated or revoked.  Performed at [facility], [place] [address], [place], [place] [address]     Radiology Studies: No results found.    Jamisyn Langer T. [place]  If 7PM-7AM, please contact night-coverage [email or web address] 01/17/2022, 2:18 PM

## 2022-01-18 ENCOUNTER — Inpatient Hospital Stay (HOSPITAL_COMMUNITY): Payer: Medicare Other

## 2022-01-18 DIAGNOSIS — Z952 Presence of prosthetic heart valve: Secondary | ICD-10-CM | POA: Diagnosis not present

## 2022-01-18 DIAGNOSIS — E039 Hypothyroidism, unspecified: Secondary | ICD-10-CM | POA: Diagnosis not present

## 2022-01-18 DIAGNOSIS — U071 COVID-19: Secondary | ICD-10-CM | POA: Diagnosis not present

## 2022-01-18 DIAGNOSIS — I2510 Atherosclerotic heart disease of native coronary artery without angina pectoris: Secondary | ICD-10-CM | POA: Diagnosis not present

## 2022-01-18 DIAGNOSIS — M25522 Pain in left elbow: Secondary | ICD-10-CM

## 2022-01-18 MED ORDER — IPRATROPIUM-ALBUTEROL 20-100 MCG/ACT IN AERS
1.0000 | INHALATION_SPRAY | Freq: Four times a day (QID) | RESPIRATORY_TRACT | Status: DC | PRN
  Administered 2022-01-20: 1 via RESPIRATORY_TRACT

## 2022-01-18 MED ORDER — DICLOFENAC SODIUM 1 % EX GEL
2.0000 g | Freq: Four times a day (QID) | CUTANEOUS | Status: DC
  Administered 2022-01-18 – 2022-01-26 (×31): 2 g via TOPICAL
  Filled 2022-01-18: qty 100

## 2022-01-18 MED ORDER — NAPROXEN 375 MG PO TABS
375.0000 mg | ORAL_TABLET | Freq: Two times a day (BID) | ORAL | Status: AC
  Administered 2022-01-18 – 2022-01-20 (×4): 375 mg via ORAL
  Filled 2022-01-18 (×4): qty 1

## 2022-01-18 NOTE — Progress Notes (Addendum)
PROGRESS NOTE  Becky Mccall NID:[id number] DOB: [birth year]   PCP: [name], MD  Patient is from: Home.  Lives alone.  Ambulates with rolling walker at baseline.  DOA: 01/15/2022 LOS: 2  Chief complaints Chief Complaint  Patient presents with   Weakness     Brief Narrative / Interim history: 88 year old F with PMH of COPD, nonobstructive CAD, severe AS s/p TAVR, hypothyroidism, HTN, HLD and diminished hearing presenting with progressive generalized weakness for 2 weeks, cough and congestion and admitted for generalized weakness and COVID-19 infection.  No oxygen requirement.  CXR without acute finding.  Started on Paxlovid, and admitted.  Stable from respiratory standpoint but generalized weakness and physical deconditioning likely due to COVID-19 infection.  Therapy recommended SNF but has to finish isolation precaution until 12/4.  TOC following.  Subjective: Seen and examined earlier this morning.  No major events overnight of this morning.  Complaining of left elbow pain.  Denies trauma or injury.  No swelling.  No numbness or tingling.  Also some tightness in left leg.  Son-in-law at bedside.  Objective: Vitals:   01/17/22 2045 01/17/22 2104 01/18/22 0648 01/18/22 1352  BP:  129/64 (!) 168/70 (!) 162/65  Pulse:  73 74 73  Resp:  '18 18 18  '$ Temp:  99 F (37.2 C) 98.9 F (37.2 C) 99.1 F (37.3 C)  TempSrc:  Oral Oral Oral  SpO2: 93% 92% 95% 94%  Weight:      Height:        Examination:  GENERAL: No apparent distress.  Nontoxic. HEENT: MMM.  Vision grossly intact.  Diminished hearing. NECK: Supple.  No apparent JVD.  RESP:  No IWOB.  Fair aeration bilaterally. CVS:  RRR. Heart sounds normal.  ABD/GI/GU: BS+. Abd soft, NTND.  MSK/EXT:  Moves extremities.   Patient with extreme range of motion in left elbow.  No focal tenderness.  No swelling or erythema. SKIN: no apparent skin lesion or wound NEURO: Awake and alert. Oriented appropriately.  No apparent  focal neuro deficit. PSYCH: Calm. Normal affect.   Procedures:  None  Microbiology summarized: COVID-19 PCR nonreactive.  Assessment and plan: Principal Problem:   COVID-19 virus infection Active Problems:   S/P TAVR (transcatheter aortic valve replacement)   Vertigo   Hyperlipidemia   COPD (chronic obstructive pulmonary disease) (HCC)   Hypothyroidism   Severe aortic stenosis   Essential hypertension   CAD (coronary artery disease), native coronary artery   Generalized weakness  COVID 19 Infection: Fully vaccinated.  No shortness of breath but dry cough and generalized weakness.  Not hypoxic.  CXR without acute finding.  Mildly elevated CRP to 5.1.  Dimer within normal for age. -Continue Paxlovid, Combivent and Mucinex -Continue IV fluid hydration -OOB/PT/OT  Generalized weakness: Likely due to the above.  Lives alone.  Independently ambulates at baseline -Management as above -Therapy recommended SNF  Left elbow pain: No trauma or injury.  No focal tenderness or erythema but pain with flexion of full extension.  X-ray with some effusion.  No history of gout. -Basic labs and uric acid in the morning -Aleve twice daily for 2 days -Voltaren gel -Reassess in the morning   Chronic COPD: Stable. -Breathing treatment as above   History of nonobstructive CAD/HLD/TAVR: Stable -Continue home regimen   Essential hypertension: Normotensive. -Continue home amlodipine -P.o. hydralazine as needed   Hypothyroidism -Continue home Synthroid  Cognitive deficit/diminished hearing: Stable. -Continue home meds    Body mass index is 28.47 kg/m.  DVT prophylaxis:  enoxaparin (LOVENOX) injection 40 mg Start: 01/15/22 2000  Code Status: DNR/DNI Family Communication: Updated patient's son-in-law at bedside, and patient's daughter over the phone  Level of care: Med-Surg Status is: Inpatient The patient will remain to inpatient because: Generalized weakness in the  setting of COVID-19 infection awaiting SNF placement   Final disposition: SNF Consultants:  None  Sch Meds:  Scheduled Meds:  amLODipine  5 mg Oral Daily   ascorbic acid  500 mg Oral Daily   aspirin  81 mg Oral QHS   diclofenac Sodium  2 g Topical QID   donepezil  5 mg Oral QHS   enoxaparin (LOVENOX) injection  40 mg Subcutaneous Q24H   Ipratropium-Albuterol  1 puff Inhalation BID   levothyroxine  150 mcg Oral QAC breakfast   mirtazapine  15 mg Oral QHS   multivitamin with minerals  1 tablet Oral Daily   naproxen  375 mg Oral BID WC   nirmatrelvir/ritonavir EUA (renal dosing)  2 tablet Oral BID   traMADol  50 mg Oral q AM   Continuous Infusions:   PRN Meds:.guaiFENesin-dextromethorphan, hydrALAZINE, magnesium hydroxide, senna-docusate  Antimicrobials: Anti-infectives (From admission, onward)    Start     Dose/Rate Route Frequency Ordered Stop   01/15/22 1800  nirmatrelvir/ritonavir EUA (PAXLOVID) 3 tablet  Status:  Discontinued        3 tablet Oral 2 times daily 01/15/22 1533 01/15/22 1710   01/15/22 1800  nirmatrelvir/ritonavir EUA (renal dosing) (PAXLOVID) 2 tablet        2 tablet Oral 2 times daily 01/15/22 1710 01/20/22 2159        I have personally reviewed the following labs and images: CBC: Recent Labs  Lab 01/15/22 1308 01/16/22 0616  WBC 6.8 5.9  NEUTROABS 4.9 4.2  HGB 14.8 12.7  HCT 45.1 39.4  MCV 94.2 93.6  PLT 148* 150   BMP &GFR Recent Labs  Lab 01/15/22 1308 01/16/22 0616  NA 141 139  K 4.5 4.2  CL 107 108  CO2 26 25  GLUCOSE 120* 88  BUN 19 13  CREATININE 1.08* 0.83  CALCIUM 8.9 8.1*   Estimated Creatinine Clearance: 48.1 mL/min (by C-G formula based on SCr of 0.83 mg/dL). Liver & Pancreas: Recent Labs  Lab 01/15/22 1308 01/16/22 0616  AST 28 18  ALT 11 8  ALKPHOS 72 53  BILITOT 0.7 0.6  PROT 7.6 6.0*  ALBUMIN 3.9 3.2*   No results for input(s): "LIPASE", "AMYLASE" in the last 168 hours. No results for input(s):  "AMMONIA" in the last 168 hours. Diabetic: No results for input(s): "HGBA1C" in the last 72 hours. No results for input(s): "GLUCAP" in the last 168 hours. Cardiac Enzymes: Recent Labs  Lab 01/15/22 1308  CKTOTAL 64   No results for input(s): "PROBNP" in the last 8760 hours. Coagulation Profile: No results for input(s): "INR", "PROTIME" in the last 168 hours. Thyroid Function Tests: No results for input(s): "TSH", "T4TOTAL", "FREET4", "T3FREE", "THYROIDAB" in the last 72 hours. Lipid Profile: No results for input(s): "CHOL", "HDL", "LDLCALC", "TRIG", "CHOLHDL", "LDLDIRECT" in the last 72 hours. Anemia Panel: Recent Labs    01/16/22 0616  FERRITIN 103   Urine analysis:    Component Value Date/Time   COLORURINE STRAW (A) 08/26/2020 0719   APPEARANCEUR CLEAR 08/26/2020 0719   LABSPEC 1.008 08/26/2020 0719   PHURINE 9.0 (H) 08/26/2020 0719   GLUCOSEU NEGATIVE 08/26/2020 0719   HGBUR NEGATIVE 08/26/2020 0719   BILIRUBINUR NEGATIVE 08/26/2020 0719  KETONESUR NEGATIVE 08/26/2020 0719   PROTEINUR NEGATIVE 08/26/2020 0719   UROBILINOGEN 0.2 05/04/2013 0328   NITRITE NEGATIVE 08/26/2020 0719   LEUKOCYTESUR NEGATIVE 08/26/2020 0719   Sepsis Labs: Invalid input(s): "PROCALCITONIN", "LACTICIDVEN"  Microbiology: Recent Results (from the past 240 hour(s))  Resp Panel by RT-PCR (Flu A&B, Covid) Anterior Nasal Swab     Status: Abnormal   Collection Time: 01/15/22  2:08 PM   Specimen: Anterior Nasal Swab  Result Value Ref Range Status   SARS Coronavirus 2 by RT PCR POSITIVE (A) NEGATIVE Final    Comment: (NOTE) SARS-CoV-2 target nucleic acids are DETECTED.  The SARS-CoV-2 RNA is generally detectable in upper respiratory specimens during the acute phase of infection. Positive results are indicative of the presence of the identified virus, but do not rule out bacterial infection or co-infection with other pathogens not detected by the test. Clinical correlation with patient  history and other diagnostic information is necessary to determine patient infection status. The expected result is Negative.  Fact Sheet for Patients: EntrepreneurPulse.com.au  Fact Sheet for Healthcare Providers: IncredibleEmployment.be  This test is not yet approved or cleared by the [place] FDA and  has been authorized for detection and/or diagnosis of SARS-CoV-2 by FDA under an Emergency Use Authorization (EUA).  This EUA will remain in effect (meaning this test can be used) for the duration of  the COVID-19 declaration under Section 564(b)(1) of the A ct, 21 U.S.C. section 360bbb-3(b)(1), unless the authorization is terminated or revoked sooner.     Influenza A by PCR NEGATIVE NEGATIVE Final   Influenza B by PCR NEGATIVE NEGATIVE Final    Comment: (NOTE) The Xpert Xpress SARS-CoV-2/FLU/RSV plus assay is intended as an aid in the diagnosis of influenza from Nasopharyngeal swab specimens and should not be used as a sole basis for treatment. Nasal washings and aspirates are unacceptable for Xpert Xpress SARS-CoV-2/FLU/RSV testing.  Fact Sheet for Patients: EntrepreneurPulse.com.au  Fact Sheet for Healthcare Providers: IncredibleEmployment.be  This test is not yet approved or cleared by the [place] FDA and has been authorized for detection and/or diagnosis of SARS-CoV-2 by FDA under an Emergency Use Authorization (EUA). This EUA will remain in effect (meaning this test can be used) for the duration of the COVID-19 declaration under Section 564(b)(1) of the Act, 21 U.S.C. section 360bbb-3(b)(1), unless the authorization is terminated or revoked.  Performed at [facility], [place] [address], [place], [place] [address]     Radiology Studies: DG Elbow 2 Views Left  Result Date: 01/18/2022 CLINICAL DATA:  Elbow pain, left EXAM: LEFT ELBOW - 2 VIEW COMPARISON:  None  Available. FINDINGS: There is a left elbow joint effusion. There moderate elbow joint degenerative changes. There is a ring osteophyte of the radial head. No definite fracture identified. IMPRESSION: No definite fracture identified. Left elbow joint effusion, which could indicate a radiographically occult fracture if there is a history of trauma. Moderate osteoarthritis of the left elbow. Electronically Signed   By: Maurine Simmering M.D.   On: 01/18/2022 13:14      Bonner Larue T. [place]  If 7PM-7AM, please contact night-coverage [email or web address] 01/18/2022, 2:18 PM

## 2022-01-18 NOTE — TOC Initial Note (Signed)
Transition of Care [facility]) - Initial/Assessment Note    Patient Details  Name: BRYNLEY CUDDEBACK MRN: [id number] Date of Birth: 01/07/1930  Transition of Care [facility]) CM/SW Contact:    Vassie Moselle, LCSW Phone Number: 01/18/2022, 11:27 AM  Clinical Narrative:                 Spoke with pt and daughter via t/c and confirmed plan for SNF. Pt is currently COVID + and will be required to complete 10 day isolation period prior to being able to transfer to SNF. Pt's daughter shares that preferred facility for SNF placement is [facility] as it is close to where they live. CSW will send referrals for SNF closer to end of isolation period.   Expected Discharge Plan: Skilled Nursing Facility Barriers to Discharge: SNF Covid   Patient Goals and CMS Choice Patient states their goals for this hospitalization and ongoing recovery are:: For pt to go to SNF CMS Medicare.gov Compare Post Acute Care list provided to:: Patient Represenative (must comment) (Daughter) Choice offered to / list presented to : Patient, Adult Children  Expected Discharge Plan and Services Expected Discharge Plan: [place] In-house Referral: NA Discharge Planning Services: CM Consult Post Acute Care Choice: [place] Living arrangements for the past 2 months: Single Family Home                 DME Arranged: N/A DME Agency: NA                  Prior Living Arrangements/Services Living arrangements for the past 2 months: Single Family Home Lives with:: Self Patient language and need for interpreter reviewed:: Yes Do you feel safe going back to the place where you live?: Yes      Need for Family Participation in Patient Care: No (Comment) Care giver support system in place?: No (comment) Current home services: DME Criminal Activity/Legal Involvement Pertinent to Current Situation/Hospitalization: No - Comment as needed  Activities of Daily Living Home Assistive Devices/Equipment:  None ADL Screening (condition at time of admission) Patient's cognitive ability adequate to safely complete daily activities?: Yes Is the patient deaf or have difficulty hearing?: No Does the patient have difficulty seeing, even when wearing glasses/contacts?: No Does the patient have difficulty concentrating, remembering, or making decisions?: No Patient able to express need for assistance with ADLs?: Yes Does the patient have difficulty dressing or bathing?: Yes Independently performs ADLs?: No Communication: Independent Dressing (OT): Needs assistance Is this a change from baseline?: Pre-admission baseline Grooming: Needs assistance Is this a change from baseline?: Pre-admission baseline Feeding: Independent Bathing: Needs assistance Is this a change from baseline?: Pre-admission baseline Toileting: Needs assistance Is this a change from baseline?: Pre-admission baseline In/Out Bed: Needs assistance Is this a change from baseline?: Pre-admission baseline Walks in Home: Needs assistance Is this a change from baseline?: Pre-admission baseline Does the patient have difficulty walking or climbing stairs?: Yes Weakness of Legs: Both Weakness of Arms/Hands: None  Permission Sought/Granted Permission sought to share information with : Family Supports, Chartered certified accountant granted to share information with : Yes, Verbal Permission Granted  Share Information with NAME: Venia Carbon     Permission granted to share info w Relationship: Daughter  Permission granted to share info w Contact Information: [phone]  Emotional Assessment Appearance:: Other (Comment Required (Spoke with pt via t/c) Attitude/Demeanor/Rapport: Unable to Assess Affect (typically observed): Unable to Assess Orientation: : Oriented to Self, Oriented to Place, Oriented to  Time, Oriented to Situation Alcohol / Substance Use: Not Applicable Psych Involvement: No (comment)  Admission  diagnosis:  COVID-19 [U07.1] Generalized weakness [R53.1] Patient Active Problem List   Diagnosis Date Noted   COVID-19 virus infection 01/15/2022   Generalized weakness 01/15/2022   COVID-19 01/15/2022   DDD (degenerative disc disease), lumbar 07/04/2020   OSA (obstructive sleep apnea) 07/04/2020   Status post total left knee replacement 04/11/2018   CAD (coronary artery disease), native coronary artery 01/24/2018   PVC's (premature ventricular contractions) 10/11/2017   Leg cramps 10/11/2017   Essential hypertension 10/10/2017   Chronic pain of right knee 03/17/2017   Unilateral primary osteoarthritis, left knee 03/17/2017   Unilateral primary osteoarthritis, right knee 03/17/2017   Chronic pain of left knee 02/07/2017   S/P TAVR (transcatheter aortic valve replacement) 12/07/2016   Severe aortic stenosis 12/07/2016   Fibrocystic breast disease    IBS (irritable bowel syndrome)    Cervical spondylolysis    Osteopenia    Vitamin D deficiency    Squamous cell skin cancer    Carotid stenosis    Colon polyp    Vertigo 05/04/2013   Hard of hearing    Hyperlipidemia    COPD (chronic obstructive pulmonary disease) ([place])    Hypothyroidism    GERD (gastroesophageal reflux disease)    Spinal stenosis in cervical region 02/19/2013   PCP:  [name], MD Pharmacy:   [facility] ORDER) [place], [place] [address] Phone: [phone] Fax: [phone]  [facility] [phone] - [place], [place] - [address] [address] Phone: [phone] Fax: [phone]     Social Determinants of Health (SDOH) Interventions Housing Interventions: Intervention Not Indicated  Readmission Risk Interventions    01/18/2022   11:24 AM  Readmission Risk Prevention Plan  Post Dischage Appt Complete  Medication Screening Complete   Transportation Screening Complete

## 2022-01-18 NOTE — Plan of Care (Signed)

## 2022-01-18 NOTE — Progress Notes (Signed)
Mobility Specialist - Progress Note   01/18/22 1052  Mobility  Activity Ambulated with assistance in hallway  Level of Assistance Contact guard assist, steadying assist  Assistive Device Front wheel walker  Activity Response Tolerated well  Mobility Referral Yes  $Mobility charge 1 Mobility   Pt received in bed and agreed to mobility, Min A for bed mobility, Min A for sit to stand. Pt felt wozzy upon standing so sat back down in bed. Left EOB with all needs met and family and NT in room.   Roderick Pee Mobility Specialist

## 2022-01-19 DIAGNOSIS — Z952 Presence of prosthetic heart valve: Secondary | ICD-10-CM | POA: Diagnosis not present

## 2022-01-19 DIAGNOSIS — I2510 Atherosclerotic heart disease of native coronary artery without angina pectoris: Secondary | ICD-10-CM | POA: Diagnosis not present

## 2022-01-19 DIAGNOSIS — E039 Hypothyroidism, unspecified: Secondary | ICD-10-CM | POA: Diagnosis not present

## 2022-01-19 DIAGNOSIS — U071 COVID-19: Secondary | ICD-10-CM | POA: Diagnosis not present

## 2022-01-19 LAB — CBC
HCT: 45.7 % (ref 36.0–46.0)
Hemoglobin: 14.8 g/dL (ref 12.0–15.0)
MCH: 30.4 pg (ref 26.0–34.0)
MCHC: 32.4 g/dL (ref 30.0–36.0)
MCV: 93.8 fL (ref 80.0–100.0)
Platelets: 195 10*3/uL (ref 150–400)
RBC: 4.87 MIL/uL (ref 3.87–5.11)
RDW: 13.2 % (ref 11.5–15.5)
WBC: 5.5 10*3/uL (ref 4.0–10.5)
nRBC: 0 % (ref 0.0–0.2)

## 2022-01-19 LAB — RENAL FUNCTION PANEL
Albumin: 3.2 g/dL — ABNORMAL LOW (ref 3.5–5.0)
Anion gap: 9 (ref 5–15)
BUN: 19 mg/dL (ref 8–23)
CO2: 26 mmol/L (ref 22–32)
Calcium: 8.7 mg/dL — ABNORMAL LOW (ref 8.9–10.3)
Chloride: 106 mmol/L (ref 98–111)
Creatinine, Ser: 1.01 mg/dL — ABNORMAL HIGH (ref 0.44–1.00)
GFR, Estimated: 53 mL/min — ABNORMAL LOW (ref >60)
Glucose, Bld: 84 mg/dL (ref 70–99)
Phosphorus: 4.7 mg/dL — ABNORMAL HIGH (ref 2.5–4.6)
Potassium: 3.8 mmol/L (ref 3.5–5.1)
Sodium: 141 mmol/L (ref 135–145)

## 2022-01-19 LAB — C-REACTIVE PROTEIN: CRP: 5.6 mg/dL — ABNORMAL HIGH (ref <1.0)

## 2022-01-19 LAB — URIC ACID: Uric Acid, Serum: 4.7 mg/dL (ref 2.5–7.1)

## 2022-01-19 LAB — MAGNESIUM: Magnesium: 2.4 mg/dL (ref 1.7–2.4)

## 2022-01-19 NOTE — Care Management Important Message (Signed)
Important Message  Patient Details IM Letter given. Name: MIRZA KIDNEY MRN: [id number] Date of Birth: January 26, 1930   Medicare Important Message Given:  Yes     Kerin Salen 01/19/2022, 10:56 AM

## 2022-01-19 NOTE — Progress Notes (Signed)
PROGRESS NOTE  Becky Mccall GGY:[id number] DOB: May 30, 1929   PCP: [name], MD  Patient is from: Home.  Lives alone.  Ambulates with rolling walker at baseline.  DOA: 01/15/2022 LOS: 2  Chief complaints Chief Complaint  Patient presents with   Weakness     Brief Narrative / Interim history: 88 year old F with PMH of COPD, nonobstructive CAD, severe AS s/p TAVR, hypothyroidism, HTN, HLD and diminished hearing presenting with progressive generalized weakness for 2 weeks, cough and congestion and admitted for generalized weakness and COVID-19 infection.  No oxygen requirement.  CXR without acute finding.  Started on Paxlovid, and admitted.  Stable from respiratory standpoint but generalized weakness and physical deconditioning likely due to COVID-19 infection.  Therapy recommended SNF but has to finish isolation precaution until 12/4.  TOC following.  Subjective: Seen and examined earlier this morning.  No major events overnight of this morning.  No complaints.  Elbow pain improved.  Objective: Vitals:   01/17/22 2045 01/17/22 2104 01/18/22 0648 01/18/22 1352  BP:  129/64 (!) 168/70 (!) 162/65  Pulse:  73 74 73  Resp:  '18 18 18  '$ Temp:  99 F (37.2 C) 98.9 F (37.2 C) 99.1 F (37.3 C)  TempSrc:  Oral Oral Oral  SpO2: 93% 92% 95% 94%  Weight:      Height:        Examination:  GENERAL: No apparent distress.  Nontoxic. HEENT: MMM.  Vision grossly intact.  Hard of hearing. NECK: Supple.  No apparent JVD.  RESP:  No IWOB.  Fair aeration bilaterally. CVS:  RRR. Heart sounds normal.  ABD/GI/GU: BS+. Abd soft, NTND.  MSK/EXT:  Moves extremities.  Good ROM in left elbow.  No erythema, tenderness or swelling. SKIN: no apparent skin lesion or wound NEURO: Awake and alert. Oriented appropriately.  No apparent focal neuro deficit. PSYCH: Calm. Normal affect.   Procedures:  None  Microbiology summarized: COVID-19 PCR nonreactive.  Assessment and plan: Principal  Problem:   COVID-19 virus infection Active Problems:   S/P TAVR (transcatheter aortic valve replacement)   Vertigo   Hyperlipidemia   COPD (chronic obstructive pulmonary disease) (H[facility])   Hypothyroidism   Severe aortic stenosis   Essential hypertension   CAD (coronary artery disease), native coronary artery   Generalized weakness  COVID 19 Infection: Fully vaccinated.  No shortness of breath but dry cough and generalized weakness.  Not hypoxic.  CXR without acute finding.  Mildly elevated CRP to 5.1.  Dimer within normal for age. -Continue Paxlovid, Combivent and Mucinex -Continue IV fluid hydration -OOB/PT/OT  Generalized weakness: Likely due to the above.  Lives alone.  Independently ambulates at baseline -Management as above -Therapy recommended SNF  Left elbow pain: No trauma or injury.  No focal tenderness or erythema but pain with flexion of full extension.  X-ray with some effusion.  No history of gout.  Uric acid normal.  Improved. -Aleve twice daily for 2 days -Voltaren gel -Reassess in the morning   Chronic COPD: Stable. -Breathing treatment as above   History of nonobstructive CAD/HLD/TAVR: Stable -Continue home regimen   Essential hypertension: Normotensive. -Continue home amlodipine -P.o. hydralazine as needed   Hypothyroidism -Continue home Synthroid  Cognitive deficit/diminished hearing: Stable. -Continue home meds    Body mass index is 28.47 kg/m.          DVT prophylaxis:  enoxaparin (LOVENOX) injection 40 mg Start: 01/15/22 2000  Code Status: DNR/DNI Family Communication: Updated patient's son-in-law at bedside, and patient's daughter  over the phone  Level of care: Med-Surg Status is: Inpatient The patient will remain to inpatient because: Generalized weakness in the setting of COVID-19 infection awaiting SNF placement   Final disposition: SNF Consultants:  None  Sch Meds:  Scheduled Meds:  amLODipine  5 mg Oral Daily   ascorbic  acid  500 mg Oral Daily   aspirin  81 mg Oral QHS   diclofenac Sodium  2 g Topical QID   donepezil  5 mg Oral QHS   enoxaparin (LOVENOX) injection  40 mg Subcutaneous Q24H   Ipratropium-Albuterol  1 puff Inhalation BID   levothyroxine  150 mcg Oral QAC breakfast   mirtazapine  15 mg Oral QHS   multivitamin with minerals  1 tablet Oral Daily   naproxen  375 mg Oral BID WC   nirmatrelvir/ritonavir EUA (renal dosing)  2 tablet Oral BID   traMADol  50 mg Oral q AM   Continuous Infusions:   PRN Meds:.guaiFENesin-dextromethorphan, hydrALAZINE, magnesium hydroxide, senna-docusate  Antimicrobials: Anti-infectives (From admission, onward)    Start     Dose/Rate Route Frequency Ordered Stop   01/15/22 1800  nirmatrelvir/ritonavir EUA (PAXLOVID) 3 tablet  Status:  Discontinued        3 tablet Oral 2 times daily 01/15/22 1533 01/15/22 1710   01/15/22 1800  nirmatrelvir/ritonavir EUA (renal dosing) (PAXLOVID) 2 tablet        2 tablet Oral 2 times daily 01/15/22 1710 01/20/22 2159        I have personally reviewed the following labs and images: CBC: Recent Labs  Lab 01/15/22 1308 01/16/22 0616  WBC 6.8 5.9  NEUTROABS 4.9 4.2  HGB 14.8 12.7  HCT 45.1 39.4  MCV 94.2 93.6  PLT 148* 150   BMP &GFR Recent Labs  Lab 01/15/22 1308 01/16/22 0616  NA 141 139  K 4.5 4.2  CL 107 108  CO2 26 25  GLUCOSE 120* 88  BUN 19 13  CREATININE 1.08* 0.83  CALCIUM 8.9 8.1*   Estimated Creatinine Clearance: 48.1 mL/min (by C-G formula based on SCr of 0.83 mg/dL). Liver & Pancreas: Recent Labs  Lab 01/15/22 1308 01/16/22 0616  AST 28 18  ALT 11 8  ALKPHOS 72 53  BILITOT 0.7 0.6  PROT 7.6 6.0*  ALBUMIN 3.9 3.2*   No results for input(s): "LIPASE", "AMYLASE" in the last 168 hours. No results for input(s): "AMMONIA" in the last 168 hours. Diabetic: No results for input(s): "HGBA1C" in the last 72 hours. No results for input(s): "GLUCAP" in the last 168 hours. Cardiac  Enzymes: Recent Labs  Lab 01/15/22 1308  CKTOTAL 64   No results for input(s): "PROBNP" in the last 8760 hours. Coagulation Profile: No results for input(s): "INR", "PROTIME" in the last 168 hours. Thyroid Function Tests: No results for input(s): "TSH", "T4TOTAL", "FREET4", "T3FREE", "THYROIDAB" in the last 72 hours. Lipid Profile: No results for input(s): "CHOL", "HDL", "LDLCALC", "TRIG", "CHOLHDL", "LDLDIRECT" in the last 72 hours. Anemia Panel: Recent Labs    01/16/22 0616  FERRITIN 103   Urine analysis:    Component Value Date/Time   COLORURINE STRAW (A) 08/26/2020 0719   APPEARANCEUR CLEAR 08/26/2020 0719   LABSPEC 1.008 08/26/2020 0719   PHURINE 9.0 (H) 08/26/2020 0719   GLUCOSEU NEGATIVE 08/26/2020 0719   HGBUR NEGATIVE 08/26/2020 0719   BILIRUBINUR NEGATIVE 08/26/2020 0719   KETONESUR NEGATIVE 08/26/2020 0719   PROTEINUR NEGATIVE 08/26/2020 0719   UROBILINOGEN 0.2 05/04/2013 0328   NITRITE NEGATIVE 08/26/2020 0719  LEUKOCYTESUR NEGATIVE 08/26/2020 0719   Sepsis Labs: Invalid input(s): "PROCALCITONIN", "LACTICIDVEN"  Microbiology: Recent Results (from the past 240 hour(s))  Resp Panel by RT-PCR (Flu A&B, Covid) Anterior Nasal Swab     Status: Abnormal   Collection Time: 01/15/22  2:08 PM   Specimen: Anterior Nasal Swab  Result Value Ref Range Status   SARS Coronavirus 2 by RT PCR POSITIVE (A) NEGATIVE Final    Comment: (NOTE) SARS-CoV-2 target nucleic acids are DETECTED.  The SARS-CoV-2 RNA is generally detectable in upper respiratory specimens during the acute phase of infection. Positive results are indicative of the presence of the identified virus, but do not rule out bacterial infection or co-infection with other pathogens not detected by the test. Clinical correlation with patient history and other diagnostic information is necessary to determine patient infection status. The expected result is Negative.  Fact Sheet for  Patients: EntrepreneurPulse.com.au  Fact Sheet for Healthcare Providers: IncredibleEmployment.be  This test is not yet approved or cleared by the [place] FDA and  has been authorized for detection and/or diagnosis of SARS-CoV-2 by FDA under an Emergency Use Authorization (EUA).  This EUA will remain in effect (meaning this test can be used) for the duration of  the COVID-19 declaration under Section 564(b)(1) of the A ct, 21 U.S.C. section 360bbb-3(b)(1), unless the authorization is terminated or revoked sooner.     Influenza A by PCR NEGATIVE NEGATIVE Final   Influenza B by PCR NEGATIVE NEGATIVE Final    Comment: (NOTE) The Xpert Xpress SARS-CoV-2/FLU/RSV plus assay is intended as an aid in the diagnosis of influenza from Nasopharyngeal swab specimens and should not be used as a sole basis for treatment. Nasal washings and aspirates are unacceptable for Xpert Xpress SARS-CoV-2/FLU/RSV testing.  Fact Sheet for Patients: EntrepreneurPulse.com.au  Fact Sheet for Healthcare Providers: IncredibleEmployment.be  This test is not yet approved or cleared by the [place] FDA and has been authorized for detection and/or diagnosis of SARS-CoV-2 by FDA under an Emergency Use Authorization (EUA). This EUA will remain in effect (meaning this test can be used) for the duration of the COVID-19 declaration under Section 564(b)(1) of the Act, 21 U.S.C. section 360bbb-3(b)(1), unless the authorization is terminated or revoked.  Performed at [facility], [place] [address], [place], [place] [address]     Radiology Studies: DG Elbow 2 Views Left  Result Date: 01/18/2022 CLINICAL DATA:  Elbow pain, left EXAM: LEFT ELBOW - 2 VIEW COMPARISON:  None Available. FINDINGS: There is a left elbow joint effusion. There moderate elbow joint degenerative changes. There is a ring osteophyte of the radial  head. No definite fracture identified. IMPRESSION: No definite fracture identified. Left elbow joint effusion, which could indicate a radiographically occult fracture if there is a history of trauma. Moderate osteoarthritis of the left elbow. Electronically Signed   By: Maurine Simmering M.D.   On: 01/18/2022 13:14      [place]  If 7PM-7AM, please contact night-coverage [email or web address] 01/18/2022, 2:18 PM

## 2022-01-19 NOTE — Progress Notes (Signed)
Physical Therapy Treatment Patient Details Name: Becky Mccall MRN: [id number] DOB: 09-10-1929 Today's Date: 01/19/2022   History of Present Illness Patient is a 88 year old female who presented with cough and generalized weakness. Patient was found to have COVID 19. PMH: COPD, GERD, HTN, HLD, vertigo    PT Comments    Educated pt on hand placement with supine to sit, pt pulling from bed rail and pulling on therapist's hand to upright trunk and scoot out to EOB; min A to complete transfer. Pt performs STS with min A to min G, cues to shift weight anterior, cues for hand placement, improves independence with repeated reps, pt prefers to brace BLE against from of seated surface to assist in powering up. Pt transfers to [facility], able to complete pericare with min guard for safety, releasing single UE at a time while standing at RW. Pt ambulates ~20 ft in room with RW, shuffling gait pattern with increased difficulty lifting R compared to L but improves with cues, no overt LOB, min A for safety. Educated pt on time spent OOB and seated exercises; pt able to demo and verbalize understanding of exercises. Pt on RA with SpO2 97% during session, no dyspnea noted.    Recommendations for follow up therapy are one component of a multi-disciplinary discharge planning process, led by the attending physician.  Recommendations may be updated based on patient status, additional functional criteria and insurance authorization.  Follow Up Recommendations  Skilled nursing-short term rehab (<3 hours/day) Can patient physically be transported by private vehicle: Yes   Assistance Recommended at Discharge Frequent or constant Supervision/Assistance  Patient can return home with the following A little help with walking and/or transfers;A little help with bathing/dressing/bathroom;Assistance with cooking/housework;Assist for transportation;Help with stairs or ramp for entrance   Equipment Recommendations  None  recommended by PT    Recommendations for Other Services       Precautions / Restrictions Precautions Precautions: Fall Precaution Comments: HOH speak on L side Restrictions Weight Bearing Restrictions: No     Mobility  Bed Mobility Overal bed mobility: Needs Assistance Bed Mobility: Supine to Sit  Supine to sit: Min assist  General bed mobility comments: pt pulls on bedrail and therapist's hand to upright trunk and scoot to EOB    Transfers Overall transfer level: Needs assistance Equipment used: Rolling walker (2 wheels) Transfers: Sit to/from Stand Sit to Stand: Min assist, Min guard  Step pivot transfers: Min guard  General transfer comment: pt cued for hand placement and weight shift anterior "nose over toes" needing min guard to min A to power to stand; min G to pivot to [facility]    Ambulation/Gait Ambulation/Gait assistance: Min Web designer (Feet): 20 Feet Assistive device: Rolling walker (2 wheels) Gait Pattern/deviations: Step-to pattern, Decreased stride length, Shuffle, Narrow base of support Gait velocity: decreased  General Gait Details: short step to steps, shuffling with R foot more difficult to clear but improves with cues, no overt LOB, cues for RW positioning and managing with turns   Chief Strategy Officer    Modified Rankin (Stroke Patients Only)       Balance Overall balance assessment: Needs assistance Sitting-balance support: Feet supported Sitting balance-Leahy Scale: Good Sitting balance - Comments: seated EOB   Standing balance support: During functional activity, Bilateral upper extremity supported, Reliant on assistive device for balance Standing balance-Leahy Scale: Poor     Cognition Arousal/Alertness: Awake/alert Behavior During  Therapy: WFL for tasks assessed/performed Overall Cognitive Status: Within Functional Limits for tasks assessed   General Comments: pt needing repeat cues, very [facility]         Exercises General Exercises - Lower Extremity Long Arc Quad: Seated, AROM, Strengthening, Both, 15 reps (2 sets) Hip Flexion/Marching: Seated, AROM, Strengthening, Both, 10 reps (2 sets) Other Exercises Other Exercises: STS reps from recliner with BUE assisting, x8 reps min guard    General Comments General comments (skin integrity, edema, etc.): pt on RA, no dyspnea noted, SpO2 97% on RA      Pertinent Vitals/Pain Pain Assessment Pain Assessment: No/denies pain    Home Living                          Prior Function            PT Goals (current goals can now be found in the care plan section) Acute Rehab PT Goals Patient Stated Goal: get stronger PT Goal Formulation: With patient/family Time For Goal Achievement: 01/30/22 Potential to Achieve Goals: Good Progress towards PT goals: Progressing toward goals    Frequency    Min 2X/week      PT Plan Current plan remains appropriate    Co-evaluation              AM-PAC PT "6 Clicks" Mobility   Outcome Measure  Help needed turning from your back to your side while in a flat bed without using bedrails?: A Little Help needed moving from lying on your back to sitting on the side of a flat bed without using bedrails?: A Little Help needed moving to and from a bed to a chair (including a wheelchair)?: A Little Help needed standing up from a chair using your arms (e.g., wheelchair or bedside chair)?: A Little Help needed to walk in hospital room?: A Lot Help needed climbing 3-5 steps with a railing? : A Lot 6 Click Score: 16    End of Session Equipment Utilized During Treatment: Gait belt Activity Tolerance: Patient tolerated treatment well Patient left: in chair;with call bell/phone within reach;with chair alarm set Nurse Communication: Mobility status PT Visit Diagnosis: Unsteadiness on feet (R26.81);Other abnormalities of gait and mobility (R26.89);Muscle weakness (generalized) (M62.81)     Time:  8264-1583 PT Time Calculation (min) (ACUTE ONLY): 43 min  Charges:  $Gait Training: 8-22 mins $Therapeutic Exercise: 8-22 mins $Therapeutic Activity: 8-22 mins                      Tori Aloysious Vangieson PT, DPT 01/19/22, 11:48 AM

## 2022-01-19 NOTE — Plan of Care (Signed)

## 2022-01-20 DIAGNOSIS — J449 Chronic obstructive pulmonary disease, unspecified: Secondary | ICD-10-CM | POA: Diagnosis not present

## 2022-01-20 DIAGNOSIS — U071 COVID-19: Secondary | ICD-10-CM | POA: Diagnosis not present

## 2022-01-20 DIAGNOSIS — Z952 Presence of prosthetic heart valve: Secondary | ICD-10-CM | POA: Diagnosis not present

## 2022-01-20 NOTE — Plan of Care (Signed)

## 2022-01-20 NOTE — Progress Notes (Signed)
[facility]                                                                              Becky Mccall, is a 88 y.o. female, DOB - August 12, 1929, [place] date - 01/15/2022    Outpatient Primary MD for the patient is [name], MD  LOS - 4  days  Chief Complaint  Patient presents with   Weakness       Brief summary   88 year old F with PMH of COPD, nonobstructive CAD, severe AS s/p TAVR, hypothyroidism, HTN, HLD and diminished hearing presenting with progressive generalized weakness for 2 weeks, cough and congestion and admitted for generalized weakness and COVID-19 infection.  No oxygen requirement.  CXR without acute finding.  Started on Paxlovid, and admitted.   Stable from respiratory standpoint but generalized weakness and physical deconditioning likely due to COVID-19 infection.  Therapy recommended SNF but has to finish isolation precaution until 12/4.  TOC following.   Assessment & Plan    Principal Problem:  Acute  COVID-19 virus infection -Patient has been fully vaccinated, presented with a dry cough and generalized weakness, no hypoxia or acute shortness of breath. -Mildly elevated CRP, chest x-ray with no infiltrates.  D-dimer mildly elevated. -Completed Paxlovid today 11/29. -Continue supportive treatment with inhalers, Mucinex -PT OT, nutrition, OOB  Active Problems: Generalized weakness -Likely due to acute COVID-19 infection, lives alone, at baseline ambulates independently -PT recommended SNF    Left elbow pain:  - No trauma or injury.  No focal tenderness or erythema but pain with flexion of full extension.  X-ray with some effusion.  No history of gout.  Uric acid normal.  -Currently no acute worsening pain  Chronic COPD: Stable. -Currently stable, continue Combivent inhaler   Essential hypertension -BP fairly stable, continue amlodipine   Hypothyroidism -Continue Synthroid   Cognitive deficit/diminished hearing:  Stable. -Appears to be close to her baseline  History of severe aortic stenosis S/P TAVR (transcatheter aortic valve replacement), CAD, HLP  -No acute issues, continue home regimen    Estimated body mass index is 28.47 kg/m as calculated from the following:   Height as of this encounter: '5\' 6"'$  (1.676 m).   Weight as of this encounter: 80 kg.  Code Status: DNR DVT Prophylaxis:  enoxaparin (LOVENOX) injection 40 mg Start: 01/15/22 2000   Level of Care: Level of care: Med-Surg Family Communication: Updated patient   Disposition Plan:      Remains inpatient appropriate: PT recommended SNF, needs 10-day quarantine required by SNF, until 12/4   Procedures:  None Consultants:   None Antimicrobials:   Anti-infectives (From admission, onward)    Start     Dose/Rate Route Frequency Ordered Stop   01/15/22 1800  nirmatrelvir/ritonavir EUA (PAXLOVID) 3 tablet  Status:  Discontinued        3 tablet Oral 2 times daily 01/15/22 1533 01/15/22 1710   01/15/22 1800  nirmatrelvir/ritonavir EUA (renal dosing) (PAXLOVID) 2 tablet        2 tablet Oral 2 times daily 01/15/22 1710 01/20/22 0940          Medications  amLODipine  5  mg Oral Daily   ascorbic acid  500 mg Oral Daily   aspirin  81 mg Oral QHS   diclofenac Sodium  2 g Topical QID   donepezil  5 mg Oral QHS   enoxaparin (LOVENOX) injection  40 mg Subcutaneous Q24H   levothyroxine  150 mcg Oral QAC breakfast   mirtazapine  15 mg Oral QHS   multivitamin with minerals  1 tablet Oral Daily   traMADol  50 mg Oral q AM      Subjective:   Becky Mccall was seen and examined today.  No fevers, no chest pain or acute shortness of breath, nausea or vomiting.  No acute events overnight.  + Generalized weakness  Objective:   Vitals:   01/19/22 1339 01/19/22 1932 01/20/22 0628 01/20/22 0928  BP: (!) 141/54 (!) 132/56 (!) 148/55   Pulse: 72 66 66   Resp: '18 18 16   '$ Temp: 98.7 F (37.1 C) 98 F (36.7 C) 98.2 F (36.8 C)    TempSrc: Oral Oral Oral   SpO2: 96% 92% 96% 97%  Weight:      Height:        Intake/Output Summary (Last 24 hours) at 01/20/2022 1014 Last data filed at 01/19/2022 2117 Gross per 24 hour  Intake 120 ml  Output --  Net 120 ml     Wt Readings from Last 3 Encounters:  01/15/22 80 kg  04/11/18 79.7 kg  04/03/18 79.7 kg     Exam General: Alert and oriented x 3, NAD, hearing deficit Cardiovascular: S1 S2 auscultated,  RRR Respiratory: Clear to auscultation bilaterally, no wheezing, rales  Gastrointestinal: Soft, nontender, nondistended, + bowel sounds Ext: no pedal edema bilaterally Neuro: moving all 4 extremities spontaneously Psych: Normal affect, hearing deficit    Data Reviewed:  I have personally reviewed following labs    CBC Lab Results  Component Value Date   WBC 5.5 01/19/2022   RBC 4.87 01/19/2022   HGB 14.8 01/19/2022   HCT 45.7 01/19/2022   MCV 93.8 01/19/2022   MCH 30.4 01/19/2022   PLT 195 01/19/2022   MCHC 32.4 01/19/2022   RDW 13.2 01/19/2022   LYMPHSABS 0.9 01/16/2022   MONOABS 0.8 01/16/2022   EOSABS 0.0 01/16/2022   BASOSABS 0.0 71/07/2692     Last metabolic panel Lab Results  Component Value Date   NA 141 01/19/2022   K 3.8 01/19/2022   CL 106 01/19/2022   CO2 26 01/19/2022   BUN 19 01/19/2022   CREATININE 1.01 (H) 01/19/2022   GLUCOSE 84 01/19/2022   GFRNONAA 53 (L) 01/19/2022   GFRAA 46 (L) 04/12/2018   CALCIUM 8.7 (L) 01/19/2022   PHOS 4.7 (H) 01/19/2022   PROT 6.0 (L) 01/16/2022   ALBUMIN 3.2 (L) 01/19/2022   BILITOT 0.6 01/16/2022   ALKPHOS 53 01/16/2022   AST 18 01/16/2022   ALT 8 01/16/2022   ANIONGAP 9 01/19/2022    CBG (last 3)  No results for input(s): "GLUCAP" in the last 72 hours.    Coagulation Profile: No results for input(s): "INR", "PROTIME" in the last 168 hours.   Radiology Studies: I have personally reviewed the imaging studies  DG Elbow 2 Views Left  Result Date: 01/18/2022 CLINICAL DATA:   Elbow pain, left EXAM: LEFT ELBOW - 2 VIEW COMPARISON:  None Available. FINDINGS: There is a left elbow joint effusion. There moderate elbow joint degenerative changes. There is a ring osteophyte of the radial head. No definite fracture identified. IMPRESSION: No  definite fracture identified. Left elbow joint effusion, which could indicate a radiographically occult fracture if there is a history of trauma. Moderate osteoarthritis of the left elbow. Electronically Signed   By: Maurine Simmering M.D.   On: 01/18/2022 13:14       Octave Montrose M.D. [facility] 01/20/2022, 10:14 AM  Available via Epic secure chat 7am-7pm After 7 pm, please refer to night coverage provider listed on amion.

## 2022-01-20 NOTE — Progress Notes (Signed)
Occupational Therapy Treatment Patient Details Name: Becky Mccall MRN: [id number] DOB: Feb 26, 1929 Today's Date: 01/20/2022   History of present illness Patient is a 88 year old female who presented with cough and generalized weakness. Patient was found to have COVID 19. PMH: COPD, GERD, HTN, HLD, vertigo   OT comments  Patient was able to engage in transfer to [facility] near bathroom door with min guard with cues for keeping toes on the floor with transfers. Patient was min A for toileting hygiene seated x2 with patient toileting twice during session. Patient participate in therapeutic activity with increased time. Patient would continue to benefit from skilled OT services at this time while admitted and after d/c to address noted deficits in order to improve overall safety and independence in ADLs. Patient's discharge plan remains appropriate at this time. OT will continue to follow acutely.     Recommendations for follow up therapy are one component of a multi-disciplinary discharge planning process, led by the attending physician.  Recommendations may be updated based on patient status, additional functional criteria and insurance authorization.    Follow Up Recommendations  Home health OT     Assistance Recommended at Discharge Frequent or constant Supervision/Assistance  Patient can return home with the following  A little help with walking and/or transfers;A lot of help with bathing/dressing/bathroom;Assistance with cooking/housework;Direct supervision/assist for medications management;Assist for transportation;Help with stairs or ramp for entrance;Direct supervision/assist for financial management   Equipment Recommendations  None recommended by OT    Recommendations for Other Services      Precautions / Restrictions Precautions Precautions: Fall Precaution Comments: HOH speak on L side Restrictions Weight Bearing Restrictions: No       Mobility Bed Mobility Overal bed  mobility: Needs Assistance Bed Mobility: Supine to Sit     Supine to sit: Min assist     General bed mobility comments: pt pulls on bedrail and therapist's hand to upright trunk and scoot to EOB    Transfers Overall transfer level: Needs assistance Equipment used: Rolling walker (2 wheels) Transfers: Sit to/from Stand Sit to Stand: Min guard           General transfer comment: cues for proper hand placement with increased time for patient to process nose over toes cues to keep toes onthe floor.     Balance                                           ADL either performed or assessed with clinical judgement   ADL Overall ADL's : Needs assistance/impaired                         Toilet Transfer: Minimal assistance;Ambulation;Rolling walker (2 wheels) Toilet Transfer Details (indicate cue type and reason): to [facility] at doorway of bathroom with increased Toileting- Clothing Manipulation and Hygiene: Minimal assistance;Sit to/from stand Toileting - Clothing Manipulation Details (indicate cue type and reason): with increased time with 2 toileting epsiodes during this session.     Functional mobility during ADLs: Min guard;Rolling walker (2 wheels)      Extremity/Trunk Assessment              Vision       Perception     Praxis      Cognition Arousal/Alertness: Awake/alert Behavior During Therapy: WFL for tasks assessed/performed Overall Cognitive Status: Within Functional Limits  for tasks assessed                                 General Comments: pt needing repeat cues, very HOH        Exercises Other Exercises Other Exercises: STS reps from recliner with BUE assisting, x6 reps min guard    Shoulder Instructions       General Comments      Pertinent Vitals/ Pain       Pain Assessment Pain Assessment: Faces Faces Pain Scale: Hurts little more Pain Location: discomfort on bottom in certian upright sitting.  daughter educated on recommendations for pressure relief Pain Descriptors / Indicators: Discomfort Pain Intervention(s): Limited activity within patient's tolerance, Monitored during session  Home Living                                          Prior Functioning/Environment              Frequency  Min 2X/week        Progress Toward Goals  OT Goals(current goals can now be found in the care plan section)  Progress towards OT goals: Progressing toward goals     Plan Discharge plan remains appropriate    Co-evaluation                 AM-PAC OT "6 Clicks" Daily Activity     Outcome Measure   Help from another person eating meals?: A Little Help from another person taking care of personal grooming?: A Little Help from another person toileting, which includes using toliet, bedpan, or urinal?: A Lot Help from another person bathing (including washing, rinsing, drying)?: A Lot Help from another person to put on and taking off regular upper body clothing?: A Little Help from another person to put on and taking off regular lower body clothing?: A Lot 6 Click Score: 15    End of Session Equipment Utilized During Treatment: Rolling walker (2 wheels)  OT Visit Diagnosis: Unsteadiness on feet (R26.81);Other abnormalities of gait and mobility (R26.89)   Activity Tolerance Patient tolerated treatment well   Patient Left in chair;with call bell/phone within reach;with chair alarm set;with family/visitor present   Nurse Communication Mobility status        Time: 1456-1539 OT Time Calculation (min): 43 min  Charges: OT General Charges $OT Visit: 1 Visit OT Treatments $Self Care/Home Management : 38-52 mins  Rennie Plowman, MS Acute Rehabilitation Department Office# [phone]   Willa Rough 01/20/2022, 4:13 PM

## 2022-01-21 DIAGNOSIS — U071 COVID-19: Secondary | ICD-10-CM | POA: Diagnosis not present

## 2022-01-21 DIAGNOSIS — J449 Chronic obstructive pulmonary disease, unspecified: Secondary | ICD-10-CM | POA: Diagnosis not present

## 2022-01-21 DIAGNOSIS — I2510 Atherosclerotic heart disease of native coronary artery without angina pectoris: Secondary | ICD-10-CM | POA: Diagnosis not present

## 2022-01-21 DIAGNOSIS — Z952 Presence of prosthetic heart valve: Secondary | ICD-10-CM | POA: Diagnosis not present

## 2022-01-21 NOTE — TOC Progression Note (Signed)
Transition of Care [facility]) - Progression Note    Patient Details  Name: Becky Mccall MRN: [id number] Date of Birth: [birth year]  Transition of Care [facility]) CM/SW [place], LCSW Phone Number: 01/21/2022, 1:32 PM  Clinical Narrative:    CSW re-sent referrals for SNF placement and currently awaiting bed offers. Pt able to transfer to SNF following COVID isolation period.    Expected Discharge Plan: [place] Barriers to Discharge: SNF Covid  Expected Discharge Plan and Services Expected Discharge Plan: [place] In-house Referral: NA Discharge Planning Services: CM Consult Post Acute Care Choice: [place] Living arrangements for the past 2 months: Single Family Home                 DME Arranged: N/A DME Agency: NA                   Social Determinants of Health (SDOH) Interventions Housing Interventions: Intervention Not Indicated  Readmission Risk Interventions    01/18/2022   11:24 AM  Readmission Risk Prevention Plan  Post Dischage Appt Complete  Medication Screening Complete  Transportation Screening Complete

## 2022-01-21 NOTE — NC FL2 (Signed)
[place] LEVEL OF CARE FORM     IDENTIFICATION  Patient Name: Becky Mccall Birthdate: [birth year] Sex: female Admission Date (Current Location): 01/15/2022  [facility] and [place] Number:  Herbalist and Address:  [facility],  [place], [place]      Provider Number: [id number]  Attending Physician Name and Address:  Mendel Corning, MD  Relative Name and Phone Number:  Deniece Ree [phone]    Current Level of Care: Hospital Recommended Level of Care: [place] Prior Approval Number:    Date Approved/Denied: 01/16/22 PASRR Number: [id number]  Discharge Plan: SNF    Current Diagnoses: Patient Active Problem List   Diagnosis Date Noted   COVID-19 virus infection 01/15/2022   Generalized weakness 01/15/2022   COVID-19 01/15/2022   DDD (degenerative disc disease), lumbar 07/04/2020   OSA (obstructive sleep apnea) 07/04/2020   Status post total left knee replacement 04/11/2018   CAD (coronary artery disease), native coronary artery 01/24/2018   PVC's (premature ventricular contractions) 10/11/2017   Leg cramps 10/11/2017   Essential hypertension 10/10/2017   Chronic pain of right knee 03/17/2017   Unilateral primary osteoarthritis, left knee 03/17/2017   Unilateral primary osteoarthritis, right knee 03/17/2017   Chronic pain of left knee 02/07/2017   S/P TAVR (transcatheter aortic valve replacement) 12/07/2016   Severe aortic stenosis 12/07/2016   Fibrocystic breast disease    IBS (irritable bowel syndrome)    Cervical spondylolysis    Osteopenia    Vitamin D deficiency    Squamous cell skin cancer    Carotid stenosis    Colon polyp    Vertigo 05/04/2013   Hard of hearing    Hyperlipidemia    COPD (chronic obstructive pulmonary disease) (HCC)    Hypothyroidism    GERD (gastroesophageal reflux disease)    Spinal stenosis in cervical region 02/19/2013    Orientation  RESPIRATION BLADDER Height & Weight     Self, Time, Situation, Place  Normal Incontinent, External catheter Weight: 176 lb 5.9 oz (80 kg) Height:  '5\' 6"'$  (167.6 cm)  BEHAVIORAL SYMPTOMS/MOOD NEUROLOGICAL BOWEL NUTRITION STATUS      Continent Diet (Regular)  AMBULATORY STATUS COMMUNICATION OF NEEDS Skin   Limited Assist Verbally Normal                       Personal Care Assistance Level of Assistance  Bathing, Feeding, Dressing Bathing Assistance: Limited assistance Feeding assistance: Independent Dressing Assistance: Limited assistance     Functional Limitations Info  Sight, Hearing, Speech Sight Info: Impaired Hearing Info: Impaired Speech Info: Adequate    SPECIAL CARE FACTORS FREQUENCY  PT (By licensed PT), OT (By licensed OT)     PT Frequency: 5x/wk OT Frequency: 5x/wk            Contractures Contractures Info: Not present    Additional Factors Info  Code Status, Allergies, Psychotropic Code Status Info: DNR Allergies Info: Tape, Bacitra-neomycin-polymyxin-hc, Bacitracin-polymyxin B, Lisinopril, 5-alpha Reductase Inhibitors, Ezetimibe, Statins, Welchol (Colesevelam), Nabumetone, Neosporin (Neomycin-bacitracin Zn-polymyx), Zinc Oxide Psychotropic Info: See MAR         Current Medications (01/21/2022):  This is the current hospital active medication list Current Facility-Administered Medications  Medication Dose Route Frequency Provider Last Rate Last Admin   amLODipine (NORVASC) tablet 5 mg  5 mg Oral Daily Wendee Beavers T, MD   5 mg at 01/21/22 0948   ascorbic acid (VITAMIN C) tablet 500 mg  500 mg Oral Daily Wendee Beavers T, MD   500 mg at 01/21/22 0948   aspirin chewable tablet 81 mg  81 mg Oral QHS Wendee Beavers T, MD   81 mg at 01/20/22 2138   diclofenac Sodium (VOLTAREN) 1 % topical gel 2 g  2 g Topical QID Wendee Beavers T, MD   2 g at 01/21/22 0950   donepezil (ARICEPT) tablet 5 mg  5 mg Oral QHS Wendee Beavers T, MD   5 mg at 01/20/22 2138   enoxaparin  (LOVENOX) injection 40 mg  40 mg Subcutaneous Q24H Kyle, Tyrone A, DO   40 mg at 01/20/22 2138   guaiFENesin-dextromethorphan (ROBITUSSIN DM) 100-10 MG/5ML syrup 10 mL  10 mL Oral Q4H PRN Marylyn Ishihara, Tyrone A, DO   10 mL at 01/18/22 0631   hydrALAZINE (APRESOLINE) tablet 25 mg  25 mg Oral Q6H PRN Wendee Beavers T, MD   25 mg at 01/18/22 1802   Ipratropium-Albuterol (COMBIVENT) respimat 1 puff  1 puff Inhalation Q6H PRN Mercy Riding, MD   1 puff at 01/20/22 0928   levothyroxine (SYNTHROID) tablet 150 mcg  150 mcg Oral QAC breakfast Wendee Beavers T, MD   150 mcg at 01/21/22 0606   magnesium hydroxide (MILK OF MAGNESIA) suspension 15 mL  15 mL Oral Daily PRN Wendee Beavers T, MD   15 mL at 01/19/22 1756   mirtazapine (REMERON) tablet 15 mg  15 mg Oral QHS Wendee Beavers T, MD   15 mg at 01/20/22 2138   multivitamin with minerals tablet 1 tablet  1 tablet Oral Daily Wendee Beavers T, MD   1 tablet at 01/21/22 0948   senna-docusate (Senokot-S) tablet 1 tablet  1 tablet Oral BID PRN Mercy Riding, MD   1 tablet at 01/19/22 1756   traMADol (ULTRAM) tablet 50 mg  50 mg Oral q AM Mercy Riding, MD   50 mg at 01/21/22 0606     Discharge Medications: Please see discharge summary for a list of discharge medications.  Relevant Imaging Results:  Relevant Lab Results:   Additional Information SSN: [id number]  Vassie Moselle, LCSW

## 2022-01-21 NOTE — Progress Notes (Addendum)
Physical Therapy Treatment Patient Details Name: Becky Mccall MRN: [id number] DOB: August 02, 1929 Today's Date: 01/21/2022   History of Present Illness Patient is a 88 year old female who presented with cough and generalized weakness. Patient was found to have COVID 19. PMH: COPD, GERD, HTN, HLD, vertigo    PT Comments    Patient c/o of 3/10 pain in bottom where sore is located during rest, no increase throughout session. Patient HOH needed repeated cues throughout session. Required mod HHA to pull trunk upright and use of bed pad to scoot to EOB. Pt c/o of dizziness when sitting EOB but shortly recovered. Min assist needed for transfers from elevated bed, knees buckled during initial standing but self recovered. Cues needed to lock knees. Mod assist needed from low toilet; mod assist needed from recliner after standing x 3 due to fatigue; Pt was able to ambulate 35 ft with min assist. SpO2 dropped to 85% when returning to recliner, quickly recovered to 95%. Highest HR during ambulation 124. Average SpO2 during activity 93%. Short shuffling step to steps, unsteady during turns. LOB x 1 during turns but self recovered. Patient took large turns with increased time needed. HIGH FALL RISK. Cues needed for RW positioning, larger steps, and managing turns. Ambulated to bathroom and in hallway. Pt unsteady while washing hands at sink. Pt will need ST Rehab at SNF to address mobility and functional decline prior to safely returning home.     Recommendations for follow up therapy are one component of a multi-disciplinary discharge planning process, led by the attending physician.  Recommendations may be updated based on patient status, additional functional criteria and insurance authorization.  Follow Up Recommendations  Skilled nursing-short term rehab (<3 hours/day) Can patient physically be transported by private vehicle: Yes   Assistance Recommended at Discharge Frequent or constant  Supervision/Assistance  Patient can return home with the following A little help with walking and/or transfers;A little help with bathing/dressing/bathroom;Assistance with cooking/housework;Assist for transportation;Help with stairs or ramp for entrance   Equipment Recommendations  None recommended by PT    Recommendations for Other Services       Precautions / Restrictions Precautions Precautions: Fall Precaution Comments: HOH speak on L side Restrictions Weight Bearing Restrictions: No     Mobility  Bed Mobility Overal bed mobility: Needs Assistance Bed Mobility: Supine to Sit     Supine to sit: Mod assist     General bed mobility comments: HHA to pull trunk upright and use of bed pad to scoot to EOB. Pt c/o of dizziness when sitting EOB but shortly recovered.    Transfers Overall transfer level: Needs assistance Equipment used: Rolling walker (2 wheels) Transfers: Sit to/from Stand Sit to Stand: Min guard, Mod assist           General transfer comment: Min assist needed from elevated bed, knees buckled during initial standing but self recovered; Mod assist needed from low toilet; mod assist needed from recliner after standing x 3 due to fatigue.    Ambulation/Gait Ambulation/Gait assistance: Min assist Gait Distance (Feet): 35 Feet Assistive device: Rolling walker (2 wheels) Gait Pattern/deviations: Step-to pattern, Decreased stride length, Shuffle, Narrow base of support Gait velocity: decreased     General Gait Details: short shuffling step to steps, unsteady during turns. LOB x 1 during turns but self recovered. Patient took large turns with increased time needed. Cues needed for RW positioning, larger steps, and managing turns. Ambulated to bathroom and in hallway.   Stairs  Wheelchair Mobility    Modified Rankin (Stroke Patients Only)       Balance                                            Cognition  Arousal/Alertness: Awake/alert Behavior During Therapy: WFL for tasks assessed/performed Overall Cognitive Status: Within Functional Limits for tasks assessed                                 General Comments: pt needing repeat cues, very HOH        Exercises      General Comments        Pertinent Vitals/Pain Pain Assessment Pain Assessment: 0-10 Pain Score: 3  Pain Location: discomfort on bottom Pain Descriptors / Indicators: Discomfort Pain Intervention(s): Limited activity within patient's tolerance, Monitored during session, Repositioned    Home Living                          Prior Function            PT Goals (current goals can now be found in the care plan section) Acute Rehab PT Goals Patient Stated Goal: get stronger PT Goal Formulation: With patient/family Time For Goal Achievement: 01/30/22 Potential to Achieve Goals: Good Progress towards PT goals: Progressing toward goals    Frequency    Min 2X/week      PT Plan Current plan remains appropriate    Co-evaluation              AM-PAC PT "6 Clicks" Mobility   Outcome Measure  Help needed turning from your back to your side while in a flat bed without using bedrails?: A Little Help needed moving from lying on your back to sitting on the side of a flat bed without using bedrails?: A Little Help needed moving to and from a bed to a chair (including a wheelchair)?: A Little Help needed standing up from a chair using your arms (e.g., wheelchair or bedside chair)?: A Little Help needed to walk in hospital room?: A Little Help needed climbing 3-5 steps with a railing? : A Lot 6 Click Score: 17    End of Session Equipment Utilized During Treatment: Gait belt Activity Tolerance: Patient tolerated treatment well Patient left: in chair;with call bell/phone within reach;with chair alarm set;with family/visitor present Nurse Communication: Mobility status PT Visit Diagnosis:  Unsteadiness on feet (R26.81);Other abnormalities of gait and mobility (R26.89);Muscle weakness (generalized) (M62.81)     Time: 3212-2482 PT Time Calculation (min) (ACUTE ONLY): 27 min  Charges:  $Gait Training: 8-22 mins $Therapeutic Activity: 8-22 mins                      Becky Mccall 01/21/2022, 1:02 PM

## 2022-01-21 NOTE — Progress Notes (Signed)
[facility]                                                                              Becky Mccall, is a 88 y.o. female, DOB - [birth year], [place] date - 01/15/2022    Outpatient Primary MD for the patient is [name], MD  LOS - 5  days  Chief Complaint  Patient presents with   Weakness       Brief summary   88 year old F with PMH of COPD, nonobstructive CAD, severe AS s/p TAVR, hypothyroidism, HTN, HLD and diminished hearing presenting with progressive generalized weakness for 2 weeks, cough and congestion and admitted for generalized weakness and COVID-19 infection.  No oxygen requirement.  CXR without acute finding.  Started on Paxlovid, and admitted.   Stable from respiratory standpoint but generalized weakness and physical deconditioning likely due to COVID-19 infection.  PT recommended SNF but has to finish isolation precaution until 12/4.  TOC following.   Assessment & Plan    Principal Problem:  Acute  COVID-19 virus infection -Patient has been fully vaccinated, presented with a dry cough and generalized weakness, no hypoxia or acute shortness of breath. -Mildly elevated CRP, chest x-ray with no infiltrates.  D-dimer mildly elevated. -Completed Paxlovid on 11/29. -Continue supportive treatment with inhalers, Mucinex -PT OT, nutrition, OOB -No hypoxia, O2 sats 93% on room air  Active Problems: Generalized weakness -Likely due to acute COVID-19 infection, lives alone, at baseline ambulates independently -PT recommended SNF    Left elbow pain:  - No trauma or injury.  No focal tenderness or erythema but pain with flexion of full extension.  X-ray with some effusion.  No history of gout.  Uric acid normal.  -Currently no acute worsening pain  Chronic COPD: Stable. -Currently stable, continue Combivent inhaler   Essential hypertension -BP fairly stable, continue amlodipine   Hypothyroidism -Continue Synthroid    Cognitive deficit/diminished hearing: Stable. -Appears to be close to her baseline  History of severe aortic stenosis S/P TAVR (transcatheter aortic valve replacement), CAD, HLP  -No acute issues, continue home regimen    Estimated body mass index is 28.47 kg/m as calculated from the following:   Height as of this encounter: '5\' 6"'$  (1.676 m).   Weight as of this encounter: 80 kg.  Code Status: DNR DVT Prophylaxis:  enoxaparin (LOVENOX) injection 40 mg Start: 01/15/22 2000   Level of Care: Level of care: Med-Surg Family Communication: Updated patient   Disposition Plan:      Remains inpatient appropriate: PT recommended SNF, needs 10-day quarantine required by SNF, until 12/4   Procedures:  None Consultants:   None Antimicrobials:   Anti-infectives (From admission, onward)    Start     Dose/Rate Route Frequency Ordered Stop   01/15/22 1800  nirmatrelvir/ritonavir EUA (PAXLOVID) 3 tablet  Status:  Discontinued        3 tablet Oral 2 times daily 01/15/22 1533 01/15/22 1710   01/15/22 1800  nirmatrelvir/ritonavir EUA (renal dosing) (PAXLOVID) 2 tablet        2 tablet Oral 2 times daily 01/15/22 1710 01/20/22 0940  Medications  amLODipine  5 mg Oral Daily   ascorbic acid  500 mg Oral Daily   aspirin  81 mg Oral QHS   diclofenac Sodium  2 g Topical QID   donepezil  5 mg Oral QHS   enoxaparin (LOVENOX) injection  40 mg Subcutaneous Q24H   levothyroxine  150 mcg Oral QAC breakfast   mirtazapine  15 mg Oral QHS   multivitamin with minerals  1 tablet Oral Daily   traMADol  50 mg Oral q AM      Subjective:   Darriana Deboy was seen and examined today.  No complaints.  Has generalized weakness, improving, no acute chest pain or shortness of breath.  On room air.  Objective:   Vitals:   01/20/22 0928 01/20/22 1229 01/20/22 2141 01/21/22 0605  BP:  (!) 131/57 (!) 145/61 (!) 145/59  Pulse:  72 62 66  Resp:  '18 18 18  '$ Temp:  98.7 F (37.1 C) 98.4 F  (36.9 C) 98.9 F (37.2 C)  TempSrc:   Oral Oral  SpO2: 97% 94% 94% 93%  Weight:      Height:        Intake/Output Summary (Last 24 hours) at 01/21/2022 1243 Last data filed at 01/21/2022 5427 Gross per 24 hour  Intake 360 ml  Output 601 ml  Net -241 ml     Wt Readings from Last 3 Encounters:  01/15/22 80 kg  04/11/18 79.7 kg  04/03/18 79.7 kg   Physical Exam General: Alert and oriented x 3, NAD, hearing deficit Cardiovascular: S1 S2 clear, RRR.  Respiratory: CTAB, no wheezing Gastrointestinal: Soft, nontender, nondistended, NBS Ext: no pedal edema bilaterally Neuro: no new deficits Psych: Normal affect     Data Reviewed:  I have personally reviewed following labs    CBC Lab Results  Component Value Date   WBC 5.5 01/19/2022   RBC 4.87 01/19/2022   HGB 14.8 01/19/2022   HCT 45.7 01/19/2022   MCV 93.8 01/19/2022   MCH 30.4 01/19/2022   PLT 195 01/19/2022   MCHC 32.4 01/19/2022   RDW 13.2 01/19/2022   LYMPHSABS 0.9 01/16/2022   MONOABS 0.8 01/16/2022   EOSABS 0.0 01/16/2022   BASOSABS 0.0 08/15/7626     Last metabolic panel Lab Results  Component Value Date   NA 141 01/19/2022   K 3.8 01/19/2022   CL 106 01/19/2022   CO2 26 01/19/2022   BUN 19 01/19/2022   CREATININE 1.01 (H) 01/19/2022   GLUCOSE 84 01/19/2022   GFRNONAA 53 (L) 01/19/2022   GFRAA 46 (L) 04/12/2018   CALCIUM 8.7 (L) 01/19/2022   PHOS 4.7 (H) 01/19/2022   PROT 6.0 (L) 01/16/2022   ALBUMIN 3.2 (L) 01/19/2022   BILITOT 0.6 01/16/2022   ALKPHOS 53 01/16/2022   AST 18 01/16/2022   ALT 8 01/16/2022   ANIONGAP 9 01/19/2022    CBG (last 3)  No results for input(s): "GLUCAP" in the last 72 hours.    Coagulation Profile: No results for input(s): "INR", "PROTIME" in the last 168 hours.   Radiology Studies: I have personally reviewed the imaging studies  No results found.     Estill Cotta M.D. [facility] 01/21/2022, 12:43 PM  Available via Epic secure chat  7am-7pm After 7 pm, please refer to night coverage provider listed on amion.

## 2022-01-22 DIAGNOSIS — J449 Chronic obstructive pulmonary disease, unspecified: Secondary | ICD-10-CM | POA: Diagnosis not present

## 2022-01-22 DIAGNOSIS — U071 COVID-19: Secondary | ICD-10-CM | POA: Diagnosis not present

## 2022-01-22 DIAGNOSIS — I2510 Atherosclerotic heart disease of native coronary artery without angina pectoris: Secondary | ICD-10-CM | POA: Diagnosis not present

## 2022-01-22 DIAGNOSIS — Z952 Presence of prosthetic heart valve: Secondary | ICD-10-CM | POA: Diagnosis not present

## 2022-01-22 NOTE — Care Management Important Message (Signed)
Important Message  Patient Details IM Letter given Name: Becky Mccall MRN: [id number] Date of Birth: [birth year]   Medicare Important Message Given:  Yes     Kerin Salen 01/22/2022, 12:05 PM

## 2022-01-22 NOTE — Progress Notes (Signed)
Mobility Specialist - Progress Note   01/22/22 1002  Mobility  Activity Ambulated with assistance in hallway  Level of Assistance Contact guard assist, steadying assist  Assistive Device Front wheel walker  Distance Ambulated (ft) 100 ft  Activity Response Tolerated well  Mobility Referral Yes  $Mobility charge 1 Mobility   Pt received in bed and agreeable to mobility. Pt is ModA from sit-to-stand and contact during ambulation. Pt required marching in place to warm her legs up. No complaints during mobility. Pt to bed after session with all needs met.    Maya  Mobility Specialist  

## 2022-01-22 NOTE — Progress Notes (Signed)
Occupational Therapy Treatment Patient Details Name: Becky Mccall MRN: [id number] DOB: 09-Dec-1929 Today's Date: 01/22/2022   History of present illness Patient is a 88 year old female who presented with cough and generalized weakness. Patient was found to have COVID 19. PMH: COPD, GERD, HTN, HLD, vertigo   OT comments  The session emphasized functional strengthening, ADL instruction for toileting at bathroom level, and functional transfer training. She required min assist to stand using a RW, min assist for toileting at bathroom level, and min guard assist for hand washing standing at sink. She needed steadying assist in dynamic standing and occasional verbal cues for general safety during out of bed activity. She is demonstrating gradual functional progress. Continue OT plan of care.    Recommendations for follow up therapy are one component of a multi-disciplinary discharge planning process, led by the attending physician.  Recommendations may be updated based on patient status, additional functional criteria and insurance authorization.    Follow Up Recommendations  Skilled nursing-short term rehab (<3 hours/day)     Assistance Recommended at Discharge Frequent or constant Supervision/Assistance  Patient can return home with the following  A little help with walking and/or transfers;A lot of help with bathing/dressing/bathroom;Assistance with cooking/housework;Direct supervision/assist for medications management;Assist for transportation;Help with stairs or ramp for entrance   Equipment Recommendations  None recommended by OT       Precautions / Restrictions Precautions Precaution Comments: HOH speak on L side Restrictions Weight Bearing Restrictions: No       Mobility Bed Mobility Overal bed mobility: Needs Assistance Bed Mobility: Supine to Sit     Supine to sit: Min assist          Transfers Overall transfer level: Needs assistance Equipment used: Rolling  walker (2 wheels) Transfers: Sit to/from Stand Sit to Stand: Min assist           General transfer comment: Min assist needed from elevated bed, required assist to steady and min cues for posture         ADL either performed or assessed with clinical judgement   ADL Overall ADL's : Needs assistance/impaired Eating/Feeding: Set up;Sitting   Grooming: Set up;Sitting           Upper Body Dressing : Minimal assistance;Sitting   Lower Body Dressing: Moderate assistance   Toilet Transfer: Minimal assistance;Cueing for sequencing;Rolling walker (2 wheels);Ambulation Toilet Transfer Details (indicate cue type and reason): PT instructed to use grab bar as needed for added support. Toileting- Clothing Manipulation and Hygiene: Minimal assistance;Sit to/from stand;Cueing for safety Toileting - Clothing Manipulation Details (indicate cue type and reason): Pt performed hygiene seated, needed steadying assist standing, and min assist for clothing management.                       Cognition Arousal/Alertness: Awake/alert Behavior During Therapy: WFL for tasks assessed/performed Overall Cognitive Status: Within Functional Limits for tasks assessed                                Pertinent Vitals/ Pain       Pain Assessment Pain Assessment: No/denies pain         Frequency  Min 2X/week        Progress Toward Goals  OT Goals(current goals can now be found in the care plan section)     Acute Rehab OT Goals Patient Stated Goal: to get better OT Goal Formulation:  With patient/family Time For Goal Achievement: 01/30/22 Potential to Achieve Goals: Good  Plan         AM-PAC OT "6 Clicks" Daily Activity     Outcome Measure   Help from another person eating meals?: None Help from another person taking care of personal grooming?: A Little Help from another person toileting, which includes using toliet, bedpan, or urinal?: A Little Help from another  person bathing (including washing, rinsing, drying)?: A Lot Help from another person to put on and taking off regular upper body clothing?: A Little Help from another person to put on and taking off regular lower body clothing?: A Lot 6 Click Score: 17    End of Session Equipment Utilized During Treatment: Rolling walker (2 wheels)  OT Visit Diagnosis: Unsteadiness on feet (R26.81);Muscle weakness (generalized) (M62.81)   Activity Tolerance Patient tolerated treatment well   Patient Left in chair;with call bell/phone within reach;with chair alarm set   Nurse Communication Mobility status        Time: 1330-1400 OT Time Calculation (min): 30 min  Charges: OT General Charges $OT Visit: 1 Visit OT Treatments $Self Care/Home Management : 8-22 mins $Therapeutic Activity: 8-22 mins    Leota Sauers, OTR/L 01/22/2022, 2:05 PM

## 2022-01-22 NOTE — Progress Notes (Signed)
[facility]                                                                              Becky Mccall, is a 88 y.o. female, DOB - [birth year], [place] date - 01/15/2022    Outpatient Primary MD for the patient is [name], MD  LOS - 6  days  Chief Complaint  Patient presents with   Weakness       Brief summary   88 year old F with PMH of COPD, nonobstructive CAD, severe AS s/p TAVR, hypothyroidism, HTN, HLD and diminished hearing presenting with progressive generalized weakness for 2 weeks, cough and congestion and admitted for generalized weakness and COVID-19 infection.  No oxygen requirement.  CXR without acute finding.  Started on Paxlovid, and admitted.   Stable from respiratory standpoint but generalized weakness and physical deconditioning likely due to COVID-19 infection.  PT recommended SNF but has to finish isolation precaution until 12/4.  TOC following.   Assessment & Plan    Principal Problem:  Acute  COVID-19 virus infection -Patient has been fully vaccinated, presented with a dry cough and generalized weakness, no hypoxia or acute shortness of breath. -Mildly elevated CRP, chest x-ray with no infiltrates.  D-dimer mildly elevated. -Completed Paxlovid on 11/29. -Continue supportive treatment with inhalers, Mucinex -PT OT, nutrition, OOB -Stable, no hypoxia, awaiting isolation precaution until 12/4 for SNF  Active Problems: Generalized weakness -Likely due to acute COVID-19 infection, lives alone, at baseline ambulates independently -PT recommended SNF    Left elbow pain:  - No trauma or injury.  No focal tenderness or erythema but pain with flexion of full extension.  X-ray with some effusion.  No history of gout.  Uric acid normal.  -Currently no acute worsening pain  Chronic COPD: Stable. -Currently stable, continue Combivent inhaler   Essential hypertension -BP stable, continue amlodipine   Hypothyroidism -On  Synthroid   Cognitive deficit/diminished hearing: Stable. -Appears to be close to her baseline  History of severe aortic stenosis S/P TAVR (transcatheter aortic valve replacement), CAD, HLP  -No acute issues, continue home regimen    Estimated body mass index is 28.47 kg/m as calculated from the following:   Height as of this encounter: '5\' 6"'$  (1.676 m).   Weight as of this encounter: 80 kg.  Code Status: DNR DVT Prophylaxis:  enoxaparin (LOVENOX) injection 40 mg Start: 01/15/22 2000   Level of Care: Level of care: Med-Surg Family Communication: Updated patient   Disposition Plan:      Remains inpatient appropriate: PT recommended SNF, needs 10-day quarantine required by SNF, until 12/4   Procedures:  None Consultants:   None Antimicrobials:   Anti-infectives (From admission, onward)    Start     Dose/Rate Route Frequency Ordered Stop   01/15/22 1800  nirmatrelvir/ritonavir EUA (PAXLOVID) 3 tablet  Status:  Discontinued        3 tablet Oral 2 times daily 01/15/22 1533 01/15/22 1710   01/15/22 1800  nirmatrelvir/ritonavir EUA (renal dosing) (PAXLOVID) 2 tablet        2 tablet Oral 2 times daily 01/15/22 1710 01/20/22 0940  Medications  amLODipine  5 mg Oral Daily   ascorbic acid  500 mg Oral Daily   aspirin  81 mg Oral QHS   diclofenac Sodium  2 g Topical QID   donepezil  5 mg Oral QHS   enoxaparin (LOVENOX) injection  40 mg Subcutaneous Q24H   levothyroxine  150 mcg Oral QAC breakfast   mirtazapine  15 mg Oral QHS   multivitamin with minerals  1 tablet Oral Daily   traMADol  50 mg Oral q AM      Subjective:   Becky Mccall was seen and examined today.  No acute complaints, overall improving, awaiting SNF, isolation until 12/4.  On room air   Objective:   Vitals:   01/21/22 0605 01/21/22 1608 01/21/22 2118 01/22/22 0531  BP: (!) 145/59 (!) 136/59 (!) 141/59 (!) 150/62  Pulse: 66 67 66 63  Resp: 18 20 (!) 22 16  Temp: 98.9 F (37.2 C)  98.8 F (37.1 C) 98.8 F (37.1 C) 97.8 F (36.6 C)  TempSrc: Oral Oral Oral Oral  SpO2: 93% 92% 93% 94%  Weight:      Height:        Intake/Output Summary (Last 24 hours) at 01/22/2022 1411 Last data filed at 01/22/2022 0531 Gross per 24 hour  Intake --  Output 800 ml  Net -800 ml     Wt Readings from Last 3 Encounters:  01/15/22 80 kg  04/11/18 79.7 kg  04/03/18 79.7 kg   Physical Exam General: Alert and oriented x 3, NAD, hearing deficit Cardiovascular: S1 S2 clear, RRR.  Respiratory: CTAB, no wheezing Gastrointestinal: Soft, nontender, nondistended, NBS Ext: no pedal edema bilaterally Neuro: no new deficits Psych: Normal affect     Data Reviewed:  I have personally reviewed following labs    CBC Lab Results  Component Value Date   WBC 5.5 01/19/2022   RBC 4.87 01/19/2022   HGB 14.8 01/19/2022   HCT 45.7 01/19/2022   MCV 93.8 01/19/2022   MCH 30.4 01/19/2022   PLT 195 01/19/2022   MCHC 32.4 01/19/2022   RDW 13.2 01/19/2022   LYMPHSABS 0.9 01/16/2022   MONOABS 0.8 01/16/2022   EOSABS 0.0 01/16/2022   BASOSABS 0.0 16/11/9602     Last metabolic panel Lab Results  Component Value Date   NA 141 01/19/2022   K 3.8 01/19/2022   CL 106 01/19/2022   CO2 26 01/19/2022   BUN 19 01/19/2022   CREATININE 1.01 (H) 01/19/2022   GLUCOSE 84 01/19/2022   GFRNONAA 53 (L) 01/19/2022   GFRAA 46 (L) 04/12/2018   CALCIUM 8.7 (L) 01/19/2022   PHOS 4.7 (H) 01/19/2022   PROT 6.0 (L) 01/16/2022   ALBUMIN 3.2 (L) 01/19/2022   BILITOT 0.6 01/16/2022   ALKPHOS 53 01/16/2022   AST 18 01/16/2022   ALT 8 01/16/2022   ANIONGAP 9 01/19/2022    CBG (last 3)  No results for input(s): "GLUCAP" in the last 72 hours.    Coagulation Profile: No results for input(s): "INR", "PROTIME" in the last 168 hours.   Radiology Studies: I have personally reviewed the imaging studies  No results found.     Estill Cotta M.D. [facility] 01/22/2022, 2:11 PM  Available  via Epic secure chat 7am-7pm After 7 pm, please refer to night coverage provider listed on amion.

## 2022-01-22 NOTE — TOC Progression Note (Signed)
Transition of Care [facility]) - Progression Note    Patient Details  Name: Becky Mccall MRN: [id number] Date of Birth: 02-07-1930  Transition of Care [facility]) CM/SW [place], LCSW Phone Number: 01/22/2022, 10:50 AM  Clinical Narrative:    CSW reviewed bed offers for SNF w/ pt's daughter. Pt's daughter accepted placement at [facility] and is able to transfer once she has completed her COVID isolation.    Expected Discharge Plan: [place] Barriers to Discharge: SNF Covid  Expected Discharge Plan and Services Expected Discharge Plan: [place] In-house Referral: NA Discharge Planning Services: CM Consult Post Acute Care Choice: [place] Living arrangements for the past 2 months: Single Family Home                 DME Arranged: N/A DME Agency: NA                   Social Determinants of Health (SDOH) Interventions Housing Interventions: Intervention Not Indicated  Readmission Risk Interventions    01/22/2022   10:50 AM 01/18/2022   11:24 AM  Readmission Risk Prevention Plan  Post Dischage Appt Complete Complete  Medication Screening Complete Complete  Transportation Screening Complete Complete

## 2022-01-23 DIAGNOSIS — Z952 Presence of prosthetic heart valve: Secondary | ICD-10-CM | POA: Diagnosis not present

## 2022-01-23 DIAGNOSIS — U071 COVID-19: Secondary | ICD-10-CM | POA: Diagnosis not present

## 2022-01-23 DIAGNOSIS — R42 Dizziness and giddiness: Secondary | ICD-10-CM

## 2022-01-23 DIAGNOSIS — J449 Chronic obstructive pulmonary disease, unspecified: Secondary | ICD-10-CM | POA: Diagnosis not present

## 2022-01-23 LAB — BASIC METABOLIC PANEL
Anion gap: 7 (ref 5–15)
BUN: 19 mg/dL (ref 8–23)
CO2: 27 mmol/L (ref 22–32)
Calcium: 9.1 mg/dL (ref 8.9–10.3)
Chloride: 107 mmol/L (ref 98–111)
Creatinine, Ser: 0.94 mg/dL (ref 0.44–1.00)
GFR, Estimated: 58 mL/min — ABNORMAL LOW (ref >60)
Glucose, Bld: 88 mg/dL (ref 70–99)
Potassium: 4.7 mmol/L (ref 3.5–5.1)
Sodium: 141 mmol/L (ref 135–145)

## 2022-01-23 LAB — CBC
HCT: 45.7 % (ref 36.0–46.0)
Hemoglobin: 14.8 g/dL (ref 12.0–15.0)
MCH: 29.9 pg (ref 26.0–34.0)
MCHC: 32.4 g/dL (ref 30.0–36.0)
MCV: 92.3 fL (ref 80.0–100.0)
Platelets: 283 10*3/uL (ref 150–400)
RBC: 4.95 MIL/uL (ref 3.87–5.11)
RDW: 12.8 % (ref 11.5–15.5)
WBC: 4.9 10*3/uL (ref 4.0–10.5)
nRBC: 0 % (ref 0.0–0.2)

## 2022-01-23 MED ORDER — SENNOSIDES-DOCUSATE SODIUM 8.6-50 MG PO TABS
2.0000 | ORAL_TABLET | Freq: Every day | ORAL | Status: DC
  Administered 2022-01-24 – 2022-01-25 (×2): 2 via ORAL
  Filled 2022-01-23 (×2): qty 2

## 2022-01-23 MED ORDER — FLEET ENEMA 7-19 GM/118ML RE ENEM: 1.0000 | ENEMA | Freq: Once | RECTAL | Status: DC

## 2022-01-23 MED ORDER — POLYETHYLENE GLYCOL 3350 17 G PO PACK
17.0000 g | PACK | Freq: Every day | ORAL | Status: DC | PRN
  Administered 2022-01-25: 17 g via ORAL
  Filled 2022-01-23: qty 1

## 2022-01-23 NOTE — Progress Notes (Signed)
Mobility Specialist - Progress Note   01/23/22 1004  Mobility  Activity Ambulated with assistance in hallway;Transferred to/from [facility]  Level of Assistance Minimal assist, patient does 75% or more  Assistive Device Front wheel walker  Distance Ambulated (ft) 150 ft  Activity Response Tolerated well  Mobility Referral Yes  $Mobility charge 1 Mobility   Pt received in bed and agreeable to mobility. Pt is MinA sit-to-stand & contact during ambulating just for direction. Pt requested assisted to [facility] for BM.  Pt to bed after session with all needs met & call bell in reach.   Pre-mobility: 98% SpO2   Set designer

## 2022-01-23 NOTE — Progress Notes (Signed)
[facility]                                                                              Becky Mccall, is a 88 y.o. female, DOB - Dec 07, 1929, [place] date - 01/15/2022    Outpatient Primary MD for the patient is [name], MD  LOS - 7  days  Chief Complaint  Patient presents with   Weakness       Brief summary   88 year old F with PMH of COPD, nonobstructive CAD, severe AS s/p TAVR, hypothyroidism, HTN, HLD and diminished hearing presenting with progressive generalized weakness for 2 weeks, cough and congestion and admitted for generalized weakness and COVID-19 infection.  No oxygen requirement.  CXR without acute finding.  Started on Paxlovid, and admitted.   Stable from respiratory standpoint but generalized weakness and physical deconditioning likely due to COVID-19 infection.  PT recommended SNF but has to finish isolation precaution until 12/4.  TOC following.   Assessment & Plan    Principal Problem:  Acute  COVID-19 virus infection -Patient has been fully vaccinated, presented with a dry cough and generalized weakness, no hypoxia or acute shortness of breath. -Mildly elevated CRP, chest x-ray with no infiltrates.  D-dimer mildly elevated. -Completed Paxlovid on 11/29. -Continue supportive treatment with inhalers, Mucinex -PT OT, nutrition, OOB -Stable, no hypoxia, awaiting isolation precaution until 12/4 for SNF  Active Problems: Generalized weakness -Likely due to acute COVID-19 infection, lives alone, at baseline ambulates independently -PT recommended SNF    Left elbow pain:  - No trauma or injury.  No focal tenderness or erythema but pain with flexion of full extension.  X-ray with some effusion.  No history of gout.  Uric acid normal.  -Pain controlled  Chronic COPD: Stable. -Currently stable, continue Combivent inhaler   Essential hypertension -BP stable, continue amlodipine   Hypothyroidism -On Synthroid    Cognitive deficit/diminished hearing: Stable. -Appears to be close to her baseline  History of severe aortic stenosis S/P TAVR (transcatheter aortic valve replacement), CAD, HLP  -No acute issues, continue home regimen  Constipation -Placed Fleet enema, bowel regimen    Estimated body mass index is 28.47 kg/m as calculated from the following:   Height as of this encounter: '5\' 6"'$  (1.676 m).   Weight as of this encounter: 80 kg.  Code Status: DNR DVT Prophylaxis:  enoxaparin (LOVENOX) injection 40 mg Start: 01/15/22 2000   Level of Care: Level of care: Med-Surg Family Communication: Updated patient   Disposition Plan:      Remains inpatient appropriate: PT recommended SNF, needs 10-day quarantine required by SNF, until 12/4   Procedures:  None Consultants:   None Antimicrobials:   Anti-infectives (From admission, onward)    Start     Dose/Rate Route Frequency Ordered Stop   01/15/22 1800  nirmatrelvir/ritonavir EUA (PAXLOVID) 3 tablet  Status:  Discontinued        3 tablet Oral 2 times daily 01/15/22 1533 01/15/22 1710   01/15/22 1800  nirmatrelvir/ritonavir EUA (renal dosing) (PAXLOVID) 2 tablet        2 tablet Oral 2 times daily 01/15/22 1710 01/20/22 0940  Medications  amLODipine  5 mg Oral Daily   ascorbic acid  500 mg Oral Daily   aspirin  81 mg Oral QHS   diclofenac Sodium  2 g Topical QID   donepezil  5 mg Oral QHS   enoxaparin (LOVENOX) injection  40 mg Subcutaneous Q24H   levothyroxine  150 mcg Oral QAC breakfast   mirtazapine  15 mg Oral QHS   multivitamin with minerals  1 tablet Oral Daily   [START ON 01/24/2022] senna-docusate  2 tablet Oral QHS   sodium phosphate  1 enema Rectal Once   traMADol  50 mg Oral q AM      Subjective:   Becky Mccall was seen and examined today. + Constipation, no nausea or vomiting, no fevers or chills.  Awaiting SNF, continue isolation till 12/4.  On room air   Objective:   Vitals:   01/22/22  0531 01/22/22 2130 01/23/22 0609 01/23/22 1322  BP: (!) 150/62 136/62 (!) 141/58 (!) 135/55  Pulse: 63 64 66 71  Resp: '16 18 18 17  '$ Temp: 97.8 F (36.6 C) 98.4 F (36.9 C) 98.7 F (37.1 C) 98.4 F (36.9 C)  TempSrc: Oral Oral Oral Oral  SpO2: 94% 95% (!) 89% 94%  Weight:      Height:        Intake/Output Summary (Last 24 hours) at 01/23/2022 1346 Last data filed at 01/23/2022 1300 Gross per 24 hour  Intake 600 ml  Output 500 ml  Net 100 ml     Wt Readings from Last 3 Encounters:  01/15/22 80 kg  04/11/18 79.7 kg  04/03/18 79.7 kg   Physical Exam General: Alert and oriented x 3, NAD Cardiovascular: S1 S2 clear, RRR.  Respiratory: Diminished breath sound at the bases, fairly clear no wheezing Gastrointestinal: Soft, nontender, nondistended, NBS Ext: no pedal edema bilaterally Psych: Normal affect     Data Reviewed:  I have personally reviewed following labs    CBC Lab Results  Component Value Date   WBC 4.9 01/23/2022   RBC 4.95 01/23/2022   HGB 14.8 01/23/2022   HCT 45.7 01/23/2022   MCV 92.3 01/23/2022   MCH 29.9 01/23/2022   PLT 283 01/23/2022   MCHC 32.4 01/23/2022   RDW 12.8 01/23/2022   LYMPHSABS 0.9 01/16/2022   MONOABS 0.8 01/16/2022   EOSABS 0.0 01/16/2022   BASOSABS 0.0 81/82/9937     Last metabolic panel Lab Results  Component Value Date   NA 141 01/23/2022   K 4.7 01/23/2022   CL 107 01/23/2022   CO2 27 01/23/2022   BUN 19 01/23/2022   CREATININE 0.94 01/23/2022   GLUCOSE 88 01/23/2022   GFRNONAA 58 (L) 01/23/2022   GFRAA 46 (L) 04/12/2018   CALCIUM 9.1 01/23/2022   PHOS 4.7 (H) 01/19/2022   PROT 6.0 (L) 01/16/2022   ALBUMIN 3.2 (L) 01/19/2022   BILITOT 0.6 01/16/2022   ALKPHOS 53 01/16/2022   AST 18 01/16/2022   ALT 8 01/16/2022   ANIONGAP 7 01/23/2022    CBG (last 3)  No results for input(s): "GLUCAP" in the last 72 hours.    Coagulation Profile: No results for input(s): "INR", "PROTIME" in the last 168  hours.   Radiology Studies: I have personally reviewed the imaging studies  No results found.     Estill Cotta M.D. [facility] 01/23/2022, 1:46 PM  Available via Epic secure chat 7am-7pm After 7 pm, please refer to night coverage provider listed on amion.

## 2022-01-23 NOTE — Plan of Care (Signed)

## 2022-01-23 NOTE — Plan of Care (Signed)
  Problem: Education: Goal: Knowledge of risk factors and measures for prevention of condition will improve 01/23/2022 1129 by Burns Spain, RN Outcome: Progressing 01/23/2022 0715 by Burns Spain, RN Outcome: Progressing   Problem: Coping: Goal: Psychosocial and spiritual needs will be supported 01/23/2022 1129 by Burns Spain, RN Outcome: Progressing 01/23/2022 0715 by Burns Spain, RN Outcome: Progressing   Problem: Respiratory: Goal: Will maintain a patent airway 01/23/2022 1129 by Burns Spain, RN Outcome: Progressing 01/23/2022 0715 by Burns Spain, RN Outcome: Progressing Goal: Complications related to the disease process, condition or treatment will be avoided or minimized 01/23/2022 1129 by Burns Spain, RN Outcome: Progressing 01/23/2022 0715 by Burns Spain, RN Outcome: Progressing   Problem: Education: Goal: Knowledge of General Education information will improve Description: Including pain rating scale, medication(s)/side effects and non-pharmacologic comfort measures 01/23/2022 1129 by Burns Spain, RN Outcome: Progressing 01/23/2022 0715 by Burns Spain, RN Outcome: Progressing   Problem: Health Behavior/Discharge Planning: Goal: Ability to manage health-related needs will improve 01/23/2022 1129 by Burns Spain, RN Outcome: Progressing 01/23/2022 0715 by Burns Spain, RN Outcome: Progressing   Problem: Clinical Measurements: Goal: Ability to maintain clinical measurements within normal limits will improve 01/23/2022 1129 by Burns Spain, RN Outcome: Progressing 01/23/2022 0715 by Burns Spain, RN Outcome: Progressing Goal: Will remain free from infection 01/23/2022 1129 by Burns Spain, RN Outcome: Progressing 01/23/2022 0715 by Burns Spain, RN Outcome: Progressing Goal: Diagnostic test results will improve 01/23/2022 1129 by Burns Spain, RN Outcome: Progressing 01/23/2022 0715 by Burns Spain, RN Outcome: Progressing Goal: Respiratory complications will improve 01/23/2022 1129  by Burns Spain, RN Outcome: Progressing 01/23/2022 0715 by Burns Spain, RN Outcome: Progressing Goal: Cardiovascular complication will be avoided 01/23/2022 1129 by Burns Spain, RN Outcome: Progressing 01/23/2022 0715 by Burns Spain, RN Outcome: Progressing   Problem: Activity: Goal: Risk for activity intolerance will decrease 01/23/2022 1129 by Burns Spain, RN Outcome: Progressing 01/23/2022 0715 by Burns Spain, RN Outcome: Progressing   Problem: Nutrition: Goal: Adequate nutrition will be maintained 01/23/2022 1129 by Burns Spain, RN Outcome: Progressing 01/23/2022 0715 by Burns Spain, RN Outcome: Progressing   Problem: Coping: Goal: Level of anxiety will decrease 01/23/2022 1129 by Burns Spain, RN Outcome: Progressing 01/23/2022 0715 by Burns Spain, RN Outcome: Progressing   Problem: Elimination: Goal: Will not experience complications related to bowel motility 01/23/2022 1129 by Burns Spain, RN Outcome: Progressing 01/23/2022 0715 by Burns Spain, RN Outcome: Progressing Goal: Will not experience complications related to urinary retention 01/23/2022 1129 by Burns Spain, RN Outcome: Progressing 01/23/2022 0715 by Burns Spain, RN Outcome: Progressing   Problem: Pain Managment: Goal: General experience of comfort will improve 01/23/2022 1129 by Burns Spain, RN Outcome: Progressing 01/23/2022 0715 by Burns Spain, RN Outcome: Progressing   Problem: Safety: Goal: Ability to remain free from injury will improve 01/23/2022 1129 by Burns Spain, RN Outcome: Progressing 01/23/2022 0715 by Burns Spain, RN Outcome: Progressing   Problem: Skin Integrity: Goal: Risk for impaired skin integrity will decrease 01/23/2022 1129 by Burns Spain, RN Outcome: Progressing 01/23/2022 0715 by Burns Spain, RN Outcome: Progressing

## 2022-01-23 NOTE — Progress Notes (Signed)
Mobility Specialist - Progress Note   01/23/22 1420  Mobility  Activity Ambulated with assistance in hallway  Level of Assistance Minimal assist, patient does 75% or more  Assistive Device Front wheel walker  Distance Ambulated (ft) 240 ft  Activity Response Tolerated well  Mobility Referral Yes  $Mobility charge 1 Mobility   Pt received in bed and agreeable to mobility. No complaints during session. Pt states "I feel like I can run now that I have had a BM". Pt to bed after session with all needs met.     [facility]

## 2022-01-24 DIAGNOSIS — I2510 Atherosclerotic heart disease of native coronary artery without angina pectoris: Secondary | ICD-10-CM | POA: Diagnosis not present

## 2022-01-24 DIAGNOSIS — U071 COVID-19: Secondary | ICD-10-CM | POA: Diagnosis not present

## 2022-01-24 DIAGNOSIS — Z952 Presence of prosthetic heart valve: Secondary | ICD-10-CM | POA: Diagnosis not present

## 2022-01-24 DIAGNOSIS — J449 Chronic obstructive pulmonary disease, unspecified: Secondary | ICD-10-CM | POA: Diagnosis not present

## 2022-01-24 NOTE — Progress Notes (Signed)
Mobility Specialist - Progress Note   01/24/22 1107  Mobility  Activity Ambulated with assistance in hallway  Level of Assistance Contact guard assist, steadying assist  Assistive Device Front wheel walker  Distance Ambulated (ft) 200 ft  Activity Response Tolerated well  Mobility Referral Yes  $Mobility charge 1 Mobility   Pt received in bed and agreed to mobility, pt got SOB nearing EOS and pt's gait began to become more shuffled and shorter as opposed to her starting gait by medium sized steps. Pt returned to chair with all needs met and alarm on.   Roderick Pee Mobility Specialist

## 2022-01-24 NOTE — Progress Notes (Addendum)
[facility]                                                                              [name], is a 88 y.o. female, DOB - September 10, 1929, [place] date - 01/15/2022    Outpatient Primary MD for the patient is [name], MD  LOS - 8  days  Chief Complaint  Patient presents with   Weakness       Brief summary   88 year old F with PMH of COPD, nonobstructive CAD, severe AS s/p TAVR, hypothyroidism, HTN, HLD and diminished hearing presenting with progressive generalized weakness for 2 weeks, cough and congestion and admitted for generalized weakness and COVID-19 infection.  No oxygen requirement.  CXR without acute finding.  Started on Paxlovid, and admitted.   Stable from respiratory standpoint but generalized weakness and physical deconditioning likely due to COVID-19 infection.  PT recommended SNF but has to finish isolation precaution until 12/4.  TOC following.   Assessment & Plan    Principal Problem:  Acute  COVID-19 virus infection -Patient has been fully vaccinated, presented with a dry cough and generalized weakness, no hypoxia or acute shortness of breath. -Mildly elevated CRP, chest x-ray with no infiltrates.  D-dimer mildly elevated. -Completed Paxlovid on 11/29. -Continue supportive treatment with inhalers, Mucinex -PT OT, nutrition, OOB -Stable, no hypoxia, awaiting isolation precaution until 12/4 for SNF -No acute issues  Active Problems: Generalized weakness -Likely due to acute COVID-19 infection, lives alone, at baseline ambulates independently -PT recommended SNF    Left elbow pain:  - No trauma or injury.  No focal tenderness or erythema but pain with flexion of full extension.  X-ray with some effusion.  No history of gout.  Uric acid normal.  -Pain controlled, stable  Chronic COPD: Stable. -Currently stable, continue Combivent inhaler   Essential hypertension -BP stable, continue amlodipine    Hypothyroidism -On Synthroid   Cognitive deficit/diminished hearing: Stable. -Appears to be close to her baseline  History of severe aortic stenosis S/P TAVR (transcatheter aortic valve replacement), CAD, HLP  -No acute issues, continue home regimen  Constipation -Resolved, had a large BM yesterday per patient    Estimated body mass index is 28.47 kg/m as calculated from the following:   Height as of this encounter: '5\' 6"'$  (1.676 m).   Weight as of this encounter: 80 kg.  Code Status: DNR DVT Prophylaxis:  enoxaparin (LOVENOX) injection 40 mg Start: 01/15/22 2000   Level of Care: Level of care: Med-Surg Family Communication: Updated patient   Disposition Plan:      Remains inpatient appropriate: PT recommended SNF, needs 10-day quarantine required by SNF, until 12/4   Procedures:  None Consultants:   None Antimicrobials:   Anti-infectives (From admission, onward)    Start     Dose/Rate Route Frequency Ordered Stop   01/15/22 1800  nirmatrelvir/ritonavir EUA (PAXLOVID) 3 tablet  Status:  Discontinued        3 tablet Oral 2 times daily 01/15/22 1533 01/15/22 1710   01/15/22 1800  nirmatrelvir/ritonavir EUA (renal dosing) (PAXLOVID) 2 tablet        2 tablet Oral 2  times daily 01/15/22 1710 01/20/22 0940          Medications  amLODipine  5 mg Oral Daily   ascorbic acid  500 mg Oral Daily   aspirin  81 mg Oral QHS   diclofenac Sodium  2 g Topical QID   donepezil  5 mg Oral QHS   enoxaparin (LOVENOX) injection  40 mg Subcutaneous Q24H   levothyroxine  150 mcg Oral QAC breakfast   mirtazapine  15 mg Oral QHS   multivitamin with minerals  1 tablet Oral Daily   senna-docusate  2 tablet Oral QHS   sodium phosphate  1 enema Rectal Once   traMADol  50 mg Oral q AM      Subjective:   Becky Mccall was seen and examined today.  Constipation resolved, feeling much better today, no acute complaints.  Awaiting SNF   Objective:   Vitals:   01/23/22 0609  01/23/22 1322 01/23/22 2053 01/24/22 0535  BP: (!) 141/58 (!) 135/55 127/65 (!) 144/57  Pulse: 66 71 63 66  Resp: '18 17 16 18  '$ Temp: 98.7 F (37.1 C) 98.4 F (36.9 C) 98.4 F (36.9 C) 98.7 F (37.1 C)  TempSrc: Oral Oral Oral Oral  SpO2: (!) 89% 94% 92% 94%  Weight:      Height:        Intake/Output Summary (Last 24 hours) at 01/24/2022 1328 Last data filed at 01/24/2022 0900 Gross per 24 hour  Intake 360 ml  Output 400 ml  Net -40 ml     Wt Readings from Last 3 Encounters:  01/15/22 80 kg  04/11/18 79.7 kg  04/03/18 79.7 kg    Physical Exam General: Alert and oriented, NAD Cardiovascular: S1 S2 clear, RRR.  Respiratory: CTAB Gastrointestinal: Soft, nontender, nondistended, NBS Ext: no pedal edema bilaterally Neuro: no new deficits Psych: Normal affect     Data Reviewed:  I have personally reviewed following labs    CBC Lab Results  Component Value Date   WBC 4.9 01/23/2022   RBC 4.95 01/23/2022   HGB 14.8 01/23/2022   HCT 45.7 01/23/2022   MCV 92.3 01/23/2022   MCH 29.9 01/23/2022   PLT 283 01/23/2022   MCHC 32.4 01/23/2022   RDW 12.8 01/23/2022   LYMPHSABS 0.9 01/16/2022   MONOABS 0.8 01/16/2022   EOSABS 0.0 01/16/2022   BASOSABS 0.0 01/60/1093     Last metabolic panel Lab Results  Component Value Date   NA 141 01/23/2022   K 4.7 01/23/2022   CL 107 01/23/2022   CO2 27 01/23/2022   BUN 19 01/23/2022   CREATININE 0.94 01/23/2022   GLUCOSE 88 01/23/2022   GFRNONAA 58 (L) 01/23/2022   GFRAA 46 (L) 04/12/2018   CALCIUM 9.1 01/23/2022   PHOS 4.7 (H) 01/19/2022   PROT 6.0 (L) 01/16/2022   ALBUMIN 3.2 (L) 01/19/2022   BILITOT 0.6 01/16/2022   ALKPHOS 53 01/16/2022   AST 18 01/16/2022   ALT 8 01/16/2022   ANIONGAP 7 01/23/2022    CBG (last 3)  No results for input(s): "GLUCAP" in the last 72 hours.    Coagulation Profile: No results for input(s): "INR", "PROTIME" in the last 168 hours.   Radiology Studies: I have personally  reviewed the imaging studies  No results found.     Estill Cotta M.D. [facility] 01/24/2022, 1:28 PM  Available via Epic secure chat 7am-7pm After 7 pm, please refer to night coverage provider listed on amion.

## 2022-01-25 DIAGNOSIS — U071 COVID-19: Secondary | ICD-10-CM | POA: Diagnosis not present

## 2022-01-25 DIAGNOSIS — I10 Essential (primary) hypertension: Secondary | ICD-10-CM | POA: Diagnosis not present

## 2022-01-25 DIAGNOSIS — I2510 Atherosclerotic heart disease of native coronary artery without angina pectoris: Secondary | ICD-10-CM | POA: Diagnosis not present

## 2022-01-25 DIAGNOSIS — J449 Chronic obstructive pulmonary disease, unspecified: Secondary | ICD-10-CM | POA: Diagnosis not present

## 2022-01-25 LAB — URINALYSIS, ROUTINE W REFLEX MICROSCOPIC
Bilirubin Urine: NEGATIVE
Glucose, UA: NEGATIVE mg/dL
Hgb urine dipstick: NEGATIVE
Ketones, ur: NEGATIVE mg/dL
Leukocytes,Ua: NEGATIVE
Nitrite: NEGATIVE
Protein, ur: NEGATIVE mg/dL
Specific Gravity, Urine: 1.016 (ref 1.005–1.030)
pH: 7 (ref 5.0–8.0)

## 2022-01-25 LAB — CBC
HCT: 41.1 % (ref 36.0–46.0)
Hemoglobin: 13.6 g/dL (ref 12.0–15.0)
MCH: 30.1 pg (ref 26.0–34.0)
MCHC: 33.1 g/dL (ref 30.0–36.0)
MCV: 90.9 fL (ref 80.0–100.0)
Platelets: 258 10*3/uL (ref 150–400)
RBC: 4.52 MIL/uL (ref 3.87–5.11)
RDW: 12.9 % (ref 11.5–15.5)
WBC: 6 10*3/uL (ref 4.0–10.5)
nRBC: 0 % (ref 0.0–0.2)

## 2022-01-25 LAB — BASIC METABOLIC PANEL
Anion gap: 8 (ref 5–15)
BUN: 21 mg/dL (ref 8–23)
CO2: 25 mmol/L (ref 22–32)
Calcium: 8.4 mg/dL — ABNORMAL LOW (ref 8.9–10.3)
Chloride: 100 mmol/L (ref 98–111)
Creatinine, Ser: 1.01 mg/dL — ABNORMAL HIGH (ref 0.44–1.00)
GFR, Estimated: 53 mL/min — ABNORMAL LOW (ref >60)
Glucose, Bld: 106 mg/dL — ABNORMAL HIGH (ref 70–99)
Potassium: 4.1 mmol/L (ref 3.5–5.1)
Sodium: 133 mmol/L — ABNORMAL LOW (ref 135–145)

## 2022-01-25 LAB — PROCALCITONIN: Procalcitonin: <0.1 ng/mL

## 2022-01-25 MED ORDER — ACETAMINOPHEN 325 MG PO TABS
650.0000 mg | ORAL_TABLET | ORAL | Status: DC | PRN
  Administered 2022-01-25: 650 mg via ORAL
  Filled 2022-01-25: qty 2

## 2022-01-25 NOTE — TOC Progression Note (Addendum)
Transition of Care [facility]) - Progression Note    Patient Details  Name: Becky Mccall MRN: [id number] Date of Birth: 11/28/1929  Transition of [facility]) CM/SW [place], LCSW Phone Number: 01/25/2022, 11:16 AM  Clinical Narrative:    Insurance authorization has been requested for SNF. Pt able to transfer to [facility] tomorrow pending insurance approval. Updated daughter via t/c. Pt's daughter or son will transport pt to SNF.   Update 11:55am: Pt's insurance Josem Kaufmann has been approved starting 12/5 to 12/7. Josem Kaufmann ID: [id number]   Expected Discharge Plan: Skilled Nursing Facility Barriers to Discharge: SNF Covid  Expected Discharge Plan and Services Expected Discharge Plan: [place] In-house Referral: NA Discharge Planning Services: CM Consult Post Acute Care Choice: [place] Living arrangements for the past 2 months: Single Family Home                 DME Arranged: N/A DME Agency: NA                   Social Determinants of Health (SDOH) Interventions Housing Interventions: Intervention Not Indicated  Readmission Risk Interventions    01/22/2022   10:50 AM 01/18/2022   11:24 AM  Readmission Risk Prevention Plan  Post Dischage Appt Complete Complete  Medication Screening Complete Complete  Transportation Screening Complete Complete

## 2022-01-25 NOTE — Progress Notes (Addendum)
[facility]                                                                              Becky Mccall, is a 88 y.o. female, DOB - 11-26-1929, [place] date - 01/15/2022    Outpatient Primary MD for the patient is [name], MD  LOS - 9  days  Chief Complaint  Patient presents with   Weakness       Brief summary   88 year old F with PMH of COPD, nonobstructive CAD, severe AS s/p TAVR, hypothyroidism, HTN, HLD and diminished hearing presenting with progressive generalized weakness for 2 weeks, cough and congestion and admitted for generalized weakness and COVID-19 infection.  No oxygen requirement.  CXR without acute finding.  Started on Paxlovid, and admitted.   Stable from respiratory standpoint but generalized weakness and physical deconditioning likely due to COVID-19 infection.  PT recommended SNF but has to finish isolation precaution until 12/4.  TOC following.   Assessment & Plan    Principal Problem:  Acute  COVID-19 virus infection -Patient has been fully vaccinated, presented with a dry cough and generalized weakness, no hypoxia or acute shortness of breath. -Mildly elevated CRP, chest x-ray with no infiltrates.  D-dimer mildly elevated. -Completed Paxlovid on 11/29. -Continue supportive treatment with inhalers, Mucinex -PT OT, nutrition, OOB -Stable, no hypoxia, awaiting isolation precaution until 12/4 for SNF -No issues, awaiting SNF, likely DC in a.m.  Addendum: 5:10pm: Spiked low-grade temp 100.6 F, somewhat lethargic - will obtain UA, culture, blood cultures, procalcitonin, CBC, bmet -Tylenol as needed  Active Problems: Generalized weakness -Likely due to acute COVID-19 infection, lives alone, at baseline ambulates independently -PT recommended SNF    Left elbow pain:  - No trauma or injury.  No focal tenderness or erythema but pain with flexion of full extension.  X-ray with some effusion.  No history of gout.   Uric acid normal.  -Currently no acute issues, pain controlled  Chronic COPD: Stable. -Currently stable, continue Combivent inhaler   Essential hypertension -Continue amlodipine, BP stable   Hypothyroidism -On Synthroid   Cognitive deficit/diminished hearing: Stable. -Appears to be close to her baseline  History of severe aortic stenosis S/P TAVR (transcatheter aortic valve replacement), CAD, HLP  -No acute issues, continue home regimen  Constipation -Resolved    Estimated body mass index is 28.47 kg/m as calculated from the following:   Height as of this encounter: '5\' 6"'$  (1.676 m).   Weight as of this encounter: 80 kg.  Code Status: DNR DVT Prophylaxis:  enoxaparin (LOVENOX) injection 40 mg Start: 01/15/22 2000   Level of Care: Level of care: Med-Surg Family Communication: Updated patient   Disposition Plan:      Remains inpatient appropriate: PT recommended SNF, needs 10-day quarantine required by SNF, until 12/4.  Plan to DC to SNF in a.m.   Procedures:  None Consultants:   None Antimicrobials:   Anti-infectives (From admission, onward)    Start     Dose/Rate Route Frequency Ordered Stop   01/15/22 1800  nirmatrelvir/ritonavir EUA (PAXLOVID) 3 tablet  Status:  Discontinued        3  tablet Oral 2 times daily 01/15/22 1533 01/15/22 1710   01/15/22 1800  nirmatrelvir/ritonavir EUA (renal dosing) (PAXLOVID) 2 tablet        2 tablet Oral 2 times daily 01/15/22 1710 01/20/22 0940          Medications  amLODipine  5 mg Oral Daily   ascorbic acid  500 mg Oral Daily   aspirin  81 mg Oral QHS   diclofenac Sodium  2 g Topical QID   donepezil  5 mg Oral QHS   enoxaparin (LOVENOX) injection  40 mg Subcutaneous Q24H   levothyroxine  150 mcg Oral QAC breakfast   mirtazapine  15 mg Oral QHS   multivitamin with minerals  1 tablet Oral Daily   senna-docusate  2 tablet Oral QHS   sodium phosphate  1 enema Rectal Once   traMADol  50 mg Oral q AM       Subjective:   Becky Mccall was seen and examined today.  No complaints, awaiting discharge to SNF.  No nausea or vomiting, tolerating diet.  Constipation resolved  Objective:   Vitals:   01/24/22 0535 01/24/22 1430 01/24/22 2041 01/25/22 0505  BP: (!) 144/57 (!) 151/66 (!) 148/59 (!) 145/54  Pulse: 66 70 68 74  Resp: '18 16 18 20  '$ Temp: 98.7 F (37.1 C) 98.7 F (37.1 C) 98.6 F (37 C) 98.2 F (36.8 C)  TempSrc: Oral Oral Oral Oral  SpO2: 94% 94% 93% 95%  Weight:      Height:        Intake/Output Summary (Last 24 hours) at 01/25/2022 1357 Last data filed at 01/25/2022 1302 Gross per 24 hour  Intake 238 ml  Output 600 ml  Net -362 ml     Wt Readings from Last 3 Encounters:  01/15/22 80 kg  04/11/18 79.7 kg  04/03/18 79.7 kg    Physical Exam General: Alert and oriented x 3, NAD, hearing deficit Cardiovascular: S1 S2 clear, RRR.  Respiratory: CTAB, no wheezing Gastrointestinal: Soft, nontender, nondistended, NBS Ext: no pedal edema bilaterally Neuro: no new deficits Psych: Normal affect    Data Reviewed:  I have personally reviewed following labs    CBC Lab Results  Component Value Date   WBC 4.9 01/23/2022   RBC 4.95 01/23/2022   HGB 14.8 01/23/2022   HCT 45.7 01/23/2022   MCV 92.3 01/23/2022   MCH 29.9 01/23/2022   PLT 283 01/23/2022   MCHC 32.4 01/23/2022   RDW 12.8 01/23/2022   LYMPHSABS 0.9 01/16/2022   MONOABS 0.8 01/16/2022   EOSABS 0.0 01/16/2022   BASOSABS 0.0 76/16/0737     Last metabolic panel Lab Results  Component Value Date   NA 141 01/23/2022   K 4.7 01/23/2022   CL 107 01/23/2022   CO2 27 01/23/2022   BUN 19 01/23/2022   CREATININE 0.94 01/23/2022   GLUCOSE 88 01/23/2022   GFRNONAA 58 (L) 01/23/2022   GFRAA 46 (L) 04/12/2018   CALCIUM 9.1 01/23/2022   PHOS 4.7 (H) 01/19/2022   PROT 6.0 (L) 01/16/2022   ALBUMIN 3.2 (L) 01/19/2022   BILITOT 0.6 01/16/2022   ALKPHOS 53 01/16/2022   AST 18 01/16/2022   ALT 8  01/16/2022   ANIONGAP 7 01/23/2022    CBG (last 3)  No results for input(s): "GLUCAP" in the last 72 hours.    Coagulation Profile: No results for input(s): "INR", "PROTIME" in the last 168 hours.   Radiology Studies: I have personally reviewed the imaging studies  No results found.     Estill Cotta M.D. [facility] 01/25/2022, 1:57 PM  Available via Epic secure chat 7am-7pm After 7 pm, please refer to night coverage provider listed on amion.

## 2022-01-25 NOTE — Progress Notes (Signed)
Physical Therapy Treatment Patient Details Name: Becky Mccall MRN: [id number] DOB: May 26, 1929 Today's Date: 01/25/2022   History of Present Illness Patient is a 88 year old female who presented with cough and generalized weakness. Patient was found to have COVID 19. PMH: COPD, GERD, HTN, HLD, vertigo    PT Comments    Pt participated well. She is a high risk for falls when mobilizing. She required assistance and directional cues throughout session. She exhibits a shuffling gait pattern and requires cues for increased step lengths. Continue to recommend ST SNF for rehab.     Recommendations for follow up therapy are one component of a multi-disciplinary discharge planning process, led by the attending physician.  Recommendations may be updated based on patient status, additional functional criteria and insurance authorization.  Follow Up Recommendations  Skilled nursing-short term rehab (<3 hours/day) Can patient physically be transported by private vehicle: Yes   Assistance Recommended at Discharge Frequent or constant Supervision/Assistance  Patient can return home with the following A little help with walking and/or transfers;A little help with bathing/dressing/bathroom;Assistance with cooking/housework;Assist for transportation;Help with stairs or ramp for entrance   Equipment Recommendations  None recommended by PT    Recommendations for Other Services       Precautions / Restrictions Precautions Precautions: Fall Precaution Comments: HOH speak on L side Restrictions Weight Bearing Restrictions: No     Mobility  Bed Mobility Overal bed mobility: Needs Assistance Bed Mobility: Supine to Sit, Sit to Supine     Supine to sit: Min assist, HOB elevated Sit to supine: Min assist, HOB elevated   General bed mobility comments: Assist for trunk and LEs. Assist to scoot to EOB. Increased time. Cues required.    Transfers Overall transfer level: Needs  assistance Equipment used: Rolling walker (2 wheels) Transfers: Sit to/from Stand Sit to Stand: Min assist           General transfer comment: Assist to power up. Posterior bias initially with increased time for pt to find her balance. Cues for safety, technique, hand, feet placement.    Ambulation/Gait Ambulation/Gait assistance: Min assist Gait Distance (Feet): 40 Feet Assistive device: Rolling walker (2 wheels) Gait Pattern/deviations: Decreased step length - right, Decreased step length - left, Decreased stride length, Shuffle       General Gait Details: Pt continues to take short, shuffling steps. Moderate cues for increased step length, RW proximity. Fall risk. Assist to stabilize pt throughout distance and to manage RW.   Stairs             Wheelchair Mobility    Modified Rankin (Stroke Patients Only)       Balance Overall balance assessment: Needs assistance         Standing balance support: During functional activity, Bilateral upper extremity supported, Reliant on assistive device for balance Standing balance-Leahy Scale: Poor                              Cognition Arousal/Alertness: Awake/alert Behavior During Therapy: WFL for tasks assessed/performed Overall Cognitive Status: Within Functional Limits for tasks assessed                                 General Comments: pt needing repeat cues, very [facility]        Exercises      General Comments  Pertinent Vitals/Pain Pain Assessment Pain Assessment: Faces Faces Pain Scale: Hurts a little bit Pain Location: discomfort on bottom Pain Descriptors / Indicators: Discomfort, Sore Pain Intervention(s): Limited activity within patient's tolerance, Monitored during session, Repositioned    Home Living                          Prior Function            PT Goals (current goals can now be found in the care plan section) Progress towards PT goals:  Progressing toward goals    Frequency    Min 2X/week      PT Plan Current plan remains appropriate    Co-evaluation              AM-PAC PT "6 Clicks" Mobility   Outcome Measure  Help needed turning from your back to your side while in a flat bed without using bedrails?: A Little Help needed moving from lying on your back to sitting on the side of a flat bed without using bedrails?: A Little Help needed moving to and from a bed to a chair (including a wheelchair)?: A Little Help needed standing up from a chair using your arms (e.g., wheelchair or bedside chair)?: A Little Help needed to walk in hospital room?: A Little Help needed climbing 3-5 steps with a railing? : A Lot 6 Click Score: 17    End of Session Equipment Utilized During Treatment: Gait belt Activity Tolerance: Patient tolerated treatment well Patient left: in bed;with call bell/phone within reach;with bed alarm set   PT Visit Diagnosis: Muscle weakness (generalized) (M62.81);Difficulty in walking, not elsewhere classified (R26.2)     Time: 1045-1100 PT Time Calculation (min) (ACUTE ONLY): 15 min  Charges:  $Gait Training: 8-22 mins                        Doreatha Massed, PT Acute Rehabilitation  Office: [phone]

## 2022-01-26 DIAGNOSIS — L22 Diaper dermatitis: Secondary | ICD-10-CM | POA: Diagnosis not present

## 2022-01-26 DIAGNOSIS — R4181 Age-related cognitive decline: Secondary | ICD-10-CM | POA: Diagnosis not present

## 2022-01-26 DIAGNOSIS — M25522 Pain in left elbow: Secondary | ICD-10-CM | POA: Diagnosis not present

## 2022-01-26 DIAGNOSIS — K589 Irritable bowel syndrome without diarrhea: Secondary | ICD-10-CM | POA: Diagnosis not present

## 2022-01-26 DIAGNOSIS — H04129 Dry eye syndrome of unspecified lacrimal gland: Secondary | ICD-10-CM | POA: Diagnosis not present

## 2022-01-26 DIAGNOSIS — R42 Dizziness and giddiness: Secondary | ICD-10-CM | POA: Diagnosis not present

## 2022-01-26 DIAGNOSIS — Z79899 Other long term (current) drug therapy: Secondary | ICD-10-CM | POA: Diagnosis not present

## 2022-01-26 DIAGNOSIS — M6281 Muscle weakness (generalized): Secondary | ICD-10-CM | POA: Diagnosis not present

## 2022-01-26 DIAGNOSIS — I2510 Atherosclerotic heart disease of native coronary artery without angina pectoris: Secondary | ICD-10-CM | POA: Diagnosis not present

## 2022-01-26 DIAGNOSIS — J449 Chronic obstructive pulmonary disease, unspecified: Secondary | ICD-10-CM | POA: Diagnosis not present

## 2022-01-26 DIAGNOSIS — R059 Cough, unspecified: Secondary | ICD-10-CM | POA: Diagnosis not present

## 2022-01-26 DIAGNOSIS — E8809 Other disorders of plasma-protein metabolism, not elsewhere classified: Secondary | ICD-10-CM | POA: Diagnosis not present

## 2022-01-26 DIAGNOSIS — E785 Hyperlipidemia, unspecified: Secondary | ICD-10-CM | POA: Diagnosis not present

## 2022-01-26 DIAGNOSIS — I10 Essential (primary) hypertension: Secondary | ICD-10-CM | POA: Diagnosis not present

## 2022-01-26 DIAGNOSIS — M170 Bilateral primary osteoarthritis of knee: Secondary | ICD-10-CM | POA: Diagnosis not present

## 2022-01-26 DIAGNOSIS — F02A18 Dementia in other diseases classified elsewhere, mild, with other behavioral disturbance: Secondary | ICD-10-CM | POA: Diagnosis not present

## 2022-01-26 DIAGNOSIS — M4302 Spondylolysis, cervical region: Secondary | ICD-10-CM | POA: Diagnosis not present

## 2022-01-26 DIAGNOSIS — K219 Gastro-esophageal reflux disease without esophagitis: Secondary | ICD-10-CM | POA: Diagnosis not present

## 2022-01-26 DIAGNOSIS — Z952 Presence of prosthetic heart valve: Secondary | ICD-10-CM | POA: Diagnosis not present

## 2022-01-26 DIAGNOSIS — E039 Hypothyroidism, unspecified: Secondary | ICD-10-CM | POA: Diagnosis not present

## 2022-01-26 DIAGNOSIS — U071 COVID-19: Secondary | ICD-10-CM | POA: Diagnosis not present

## 2022-01-26 DIAGNOSIS — K5900 Constipation, unspecified: Secondary | ICD-10-CM | POA: Diagnosis not present

## 2022-01-26 DIAGNOSIS — E559 Vitamin D deficiency, unspecified: Secondary | ICD-10-CM | POA: Diagnosis not present

## 2022-01-26 DIAGNOSIS — M19022 Primary osteoarthritis, left elbow: Secondary | ICD-10-CM | POA: Diagnosis not present

## 2022-01-26 DIAGNOSIS — M5136 Other intervertebral disc degeneration, lumbar region: Secondary | ICD-10-CM | POA: Diagnosis not present

## 2022-01-26 DIAGNOSIS — G301 Alzheimer's disease with late onset: Secondary | ICD-10-CM | POA: Diagnosis not present

## 2022-01-26 DIAGNOSIS — N182 Chronic kidney disease, stage 2 (mild): Secondary | ICD-10-CM | POA: Diagnosis not present

## 2022-01-26 LAB — URINE CULTURE: Culture: NO GROWTH

## 2022-01-26 MED ORDER — GUAIFENESIN-DM 100-10 MG/5ML PO SYRP: 10.0000 mL | ORAL_SOLUTION | ORAL | 0 refills | Status: DC | PRN

## 2022-01-26 MED ORDER — DICLOFENAC SODIUM 1 % EX GEL: 2.0000 g | Freq: Four times a day (QID) | CUTANEOUS | Status: AC

## 2022-01-26 MED ORDER — IPRATROPIUM-ALBUTEROL 20-100 MCG/ACT IN AERS: 1.0000 | INHALATION_SPRAY | Freq: Four times a day (QID) | RESPIRATORY_TRACT | Status: DC | PRN

## 2022-01-26 MED ORDER — SENNOSIDES-DOCUSATE SODIUM 8.6-50 MG PO TABS: 2.0000 | ORAL_TABLET | Freq: Every day | ORAL | Status: DC

## 2022-01-26 MED ORDER — TRAMADOL HCL 50 MG PO TABS: 50.0000 mg | ORAL_TABLET | Freq: Every morning | ORAL | 0 refills | Status: DC

## 2022-01-26 MED ORDER — POLYETHYLENE GLYCOL 3350 17 G PO PACK: 17.0000 g | PACK | Freq: Every day | ORAL | 0 refills | Status: AC | PRN

## 2022-01-26 NOTE — Discharge Summary (Signed)
Physician Discharge Summary   Patient: Becky Mccall MRN: [id number] DOB: Aug 25, 1929  Admit date:     01/15/2022  Discharge date: 01/26/22  Discharge Physician: Estill Cotta, MD    PCP: [name], MD   Recommendations at discharge:   Continue physical therapy, CBC, bmet in 1 week  Discharge Diagnoses:    COVID-19 virus infection   S/P TAVR (transcatheter aortic valve replacement)    History of vertigo   Hyperlipidemia   COPD (chronic obstructive pulmonary disease) (HCC)   Hypothyroidism   Severe aortic stenosis   Essential hypertension   CAD (coronary artery disease), native coronary artery   Generalized weakness   Hospital Course:  88 year old F with PMH of COPD, nonobstructive CAD, severe AS s/p TAVR, hypothyroidism, HTN, HLD and diminished hearing presenting with progressive generalized weakness for 2 weeks, cough and congestion and admitted for generalized weakness and COVID-19 infection.  No oxygen requirement.  CXR without acute finding.  Started on Paxlovid, and admitted.   Stable from respiratory standpoint but generalized weakness and physical deconditioning likely due to COVID-19 infection.   PT recommended SNF, finish isolation precaution until 12/4.     Assessment and Plan:   Acute  COVID-19 virus infection -Patient has been fully vaccinated, presented with a dry cough and generalized weakness, no hypoxia or acute shortness of breath. -Mildly elevated CRP, chest x-ray with no infiltrates.  D-dimer mildly elevated. -Completed Paxlovid on 11/29. -Continue supportive treatment with inhalers, Mucinex -PT OT, nutrition, OOB -Stable, no hypoxia, awaiting isolation precaution until 12/4 for SNF -BMET CBC, procalcitonin on 12/4, within normal limits    Generalized weakness -Likely due to acute COVID-19 infection, lives alone, at baseline ambulates independently -PT recommended SNF    Left elbow pain:  - No trauma or injury.  No focal tenderness or  erythema but pain with flexion of full extension.  X-ray with some effusion.  No history of gout.  Uric acid normal.  -Currently no acute issues, pain controlled   Chronic COPD: Stable. -Currently stable, continue Combivent inhaler   Essential hypertension -Continue amlodipine, BP stable   Hypothyroidism -On Synthroid   Cognitive deficit/diminished hearing: Stable. -Appears to be close to her baseline   History of severe aortic stenosis S/P TAVR (transcatheter aortic valve replacement), CAD, HLP  -No acute issues, continue home regimen   Constipation -Resolved   Estimated body mass index is 28.47 kg/m as calculated from the following:   Height as of this encounter: '5\' 6"'$  (1.676 m).   Weight as of this encounter: 80 kg.      Pain control - [employer] Controlled Substance Reporting System database was reviewed. and patient was instructed, not to drive, operate heavy machinery, perform activities at heights, swimming or participation in water activities or provide baby-sitting services while on Pain, Sleep and Anxiety Medications; until their outpatient Physician has advised to do so again. Also recommended to not to take more than prescribed Pain, Sleep and Anxiety Medications.  Consultants: None Procedures performed: None Disposition: Skilled nursing facility Diet recommendation:  Discharge Diet Orders (From admission, onward)     Start     Ordered   01/26/22 0000  Diet - low sodium heart healthy        01/26/22 1131           Cardiac diet DISCHARGE MEDICATION: Allergies as of 01/26/2022       Reactions   Tape Rash, Other (See Comments)   Developed blisters from clear tape used over cath  site 10/2016   Bacitra-neomycin-polymyxin-hc Rash   Bacitracin-polymyxin B Rash   Lisinopril Cough   5-alpha Reductase Inhibitors Other (See Comments)   Muscle pain   Ezetimibe Other (See Comments)   Muscle pain   Statins Other (See Comments)   Muscle pain   Welchol  [colesevelam] Other (See Comments)   Muscle pain   Nabumetone Other (See Comments)   CHEST PAIN   Neosporin [neomycin-bacitracin Zn-polymyx] Other (See Comments)   Cause wound/scratch to worsen   Zinc Oxide Rash        Medication List     STOP taking these medications    hydrOXYzine 25 MG tablet Commonly known as: ATARAX   methocarbamol 500 MG tablet Commonly known as: ROBAXIN   promethazine 12.5 MG tablet Commonly known as: PHENERGAN   Spiriva Respimat 2.5 MCG/ACT Aers Generic drug: Tiotropium Bromide Monohydrate       TAKE these medications    A+D Diaper Rash Crea Apply 1 application  topically as needed (to the anus, for irritation).   acetaminophen 500 MG tablet Commonly known as: TYLENOL Take 500 mg by mouth every 6 (six) hours as needed for mild pain.   amLODipine 5 MG tablet Commonly known as: NORVASC Take 5 mg by mouth daily.   ascorbic acid 500 MG tablet Commonly known as: VITAMIN C Take 500 mg by mouth daily.   aspirin 81 MG chewable tablet Chew 1 tablet (81 mg total) by mouth 2 (two) times daily. What changed: when to take this   B COMPLEX PO Take 1 tablet by mouth daily.   CALCIUM 1000 + D PO Take 1 tablet by mouth daily.   Cinnamon 500 MG capsule Take 500 mg by mouth daily.   diclofenac Sodium 1 % Gel Commonly known as: VOLTAREN Apply 2 g topically 4 (four) times daily for 7 days. Apply to left elbow   donepezil 5 MG tablet Commonly known as: ARICEPT Take 5 mg by mouth at bedtime.   Fish Oil 1000 MG Caps Take 2,000 mg by mouth daily.   guaiFENesin-dextromethorphan 100-10 MG/5ML syrup Commonly known as: ROBITUSSIN DM Take 10 mLs by mouth every 4 (four) hours as needed for cough.   hydrocortisone 2.5 % rectal cream Commonly known as: ANUSOL-HC Place 1 Application rectally 2 (two) times daily. to affected area   Ipratropium-Albuterol 20-100 MCG/ACT Aers respimat Commonly known as: COMBIVENT Inhale 1 puff into the lungs every  6 (six) hours as needed for wheezing.   levothyroxine 150 MCG tablet Commonly known as: SYNTHROID Take 150 mcg by mouth daily before breakfast.   magnesium oxide 400 MG tablet Commonly known as: MAG-OX Take 1,200 mg by mouth at bedtime.   meclizine 12.5 MG tablet Commonly known as: ANTIVERT Take 1 tablet (12.5 mg total) by mouth 3 (three) times daily as needed for dizziness. What changed: when to take this   mirtazapine 15 MG tablet Commonly known as: REMERON Take 15 mg by mouth at bedtime.   multivitamin with minerals Tabs tablet Take 1 tablet by mouth daily.   polyethylene glycol 17 g packet Commonly known as: MIRALAX / GLYCOLAX Take 17 g by mouth daily as needed for mild constipation or moderate constipation.   pravastatin 10 MG tablet Commonly known as: PRAVACHOL Take 10 mg by mouth every Monday.   PROBIOTIC PO Take 1 capsule by mouth daily.   senna-docusate 8.6-50 MG tablet Commonly known as: Senokot-S Take 2 tablets by mouth at bedtime.   traMADol 50 MG tablet Commonly known  as: Ultram Take 1 tablet (50 mg total) by mouth in the morning.   Turmeric 500 MG Caps Take 500 mg by mouth daily.   Visine-AC 0.05-0.25 % ophthalmic solution Generic drug: tetrahydrozoline-zinc Place 2 drops into both eyes 3 (three) times daily as needed (for redness or irritation).   Vitamin D3 50 MCG (2000 UT) Tabs Take 2,000 Units by mouth daily.   vitamin E 180 MG (400 UNITS) capsule Take 400 Units by mouth daily.        Contact information for follow-up providers     [name], MD. Schedule an appointment as soon as possible for a visit in 2 week(s).   Specialty: Family Medicine Why: for hospital follow-up Contact information: [address], [address] [place] [address] [phone]              Contact information for after-discharge care     Destination     [facility] Preferred SNF .   Service: Skilled Nursing Contact  information: [address] [place] [phone]                    Discharge Exam: Danley Danker Weights   01/15/22 1232  Weight: 80 kg   S: No acute complaints, no fevers or chills this morning, no coughing.  Overnight no acute issues or fevers, cough or chest pain, shortness of breath.  Feels ready to be discharged today  BP (!) 127/115 (BP Location: Right Arm)   Pulse 74   Temp 98.9 F (37.2 C) (Oral)   Resp 17   Ht '5\' 6"'$  (1.676 m)   Wt 80 kg   LMP  (LMP Unknown)   SpO2 91%   BMI 28.47 kg/m   Physical Exam General: Alert and oriented x 3, NAD, hearing deficit Cardiovascular: S1 S2 clear, RRR.  Respiratory: CTAB, no wheezing, rales or rhonchi Gastrointestinal: Soft, nontender, nondistended, NBS Ext: no pedal edema bilaterally Neuro: no new deficits Psych: Normal affect and demeanor, alert and oriented x3    Condition at discharge: fair  The results of significant diagnostics from this hospitalization (including imaging, microbiology, ancillary and laboratory) are listed below for reference.   Imaging Studies: DG Elbow 2 Views Left  Result Date: 01/18/2022 CLINICAL DATA:  Elbow pain, left EXAM: LEFT ELBOW - 2 VIEW COMPARISON:  None Available. FINDINGS: There is a left elbow joint effusion. There moderate elbow joint degenerative changes. There is a ring osteophyte of the radial head. No definite fracture identified. IMPRESSION: No definite fracture identified. Left elbow joint effusion, which could indicate a radiographically occult fracture if there is a history of trauma. Moderate osteoarthritis of the left elbow. Electronically Signed   By: Maurine Simmering M.D.   On: 01/18/2022 13:14   DG Chest Port 1 View  Result Date: 01/15/2022 CLINICAL DATA:  Cough. EXAM: PORTABLE CHEST 1 VIEW COMPARISON:  Chest radiograph dated March 19, 2021 FINDINGS: The heart size and mediastinal contours are within normal limits. TAVR is unchanged. Low lung  volumes, without evidence of focal consolidation or pleural effusion. The visualized skeletal structures are unremarkable. IMPRESSION: Low lung volumes, without evidence of acute cardiopulmonary disease. Electronically Signed   By: Keane Police D.O.   On: 01/15/2022 13:25    Microbiology: Results for orders placed or performed during the hospital encounter of 01/15/22  Resp Panel by RT-PCR (Flu A&B, Covid) Anterior Nasal Swab     Status: Abnormal   Collection Time: 01/15/22  2:08 PM  Specimen: Anterior Nasal Swab  Result Value Ref Range Status   SARS Coronavirus 2 by RT PCR POSITIVE (A) NEGATIVE Final    Comment: (NOTE) SARS-CoV-2 target nucleic acids are DETECTED.  The SARS-CoV-2 RNA is generally detectable in upper respiratory specimens during the acute phase of infection. Positive results are indicative of the presence of the identified virus, but do not rule out bacterial infection or co-infection with other pathogens not detected by the test. Clinical correlation with patient history and other diagnostic information is necessary to determine patient infection status. The expected result is Negative.  Fact Sheet for Patients: EntrepreneurPulse.com.au  Fact Sheet for Healthcare Providers: IncredibleEmployment.be  This test is not yet approved or cleared by the [place] FDA and  has been authorized for detection and/or diagnosis of SARS-CoV-2 by FDA under an Emergency Use Authorization (EUA).  This EUA will remain in effect (meaning this test can be used) for the duration of  the COVID-19 declaration under Section 564(b)(1) of the A ct, 21 U.S.C. section 360bbb-3(b)(1), unless the authorization is terminated or revoked sooner.     Influenza A by PCR NEGATIVE NEGATIVE Final   Influenza B by PCR NEGATIVE NEGATIVE Final    Comment: (NOTE) The Xpert Xpress SARS-CoV-2/FLU/RSV plus assay is intended as an aid in the diagnosis of influenza  from Nasopharyngeal swab specimens and should not be used as a sole basis for treatment. Nasal washings and aspirates are unacceptable for Xpert Xpress SARS-CoV-2/FLU/RSV testing.  Fact Sheet for Patients: EntrepreneurPulse.com.au  Fact Sheet for Healthcare Providers: IncredibleEmployment.be  This test is not yet approved or cleared by the [place] FDA and has been authorized for detection and/or diagnosis of SARS-CoV-2 by FDA under an Emergency Use Authorization (EUA). This EUA will remain in effect (meaning this test can be used) for the duration of the COVID-19 declaration under Section 564(b)(1) of the Act, 21 U.S.C. section 360bbb-3(b)(1), unless the authorization is terminated or revoked.  Performed at [facility], [place] [address] [place], [place] [address]   Culture, blood (Routine X 2) w Reflex to ID Panel     Status: None (Preliminary result)   Collection Time: 01/25/22  6:34 PM   Specimen: BLOOD  Result Value Ref Range Status   Specimen Description   Final    BLOOD RIGHT ANTECUBITAL Performed at [facility] [address] [place], [place] [address]    Special Requests   Final    BOTTLES DRAWN AEROBIC AND ANAEROBIC Blood Culture adequate volume Performed at [place] [address] [place], [place] [address]    Culture   Final    NO GROWTH < 12 HOURS Performed at [facility] [place] [address] [place], [place] [address]    Report Status PENDING  Incomplete  Culture, blood (Routine X 2) w Reflex to ID Panel     Status: None (Preliminary result)   Collection Time: 01/25/22  6:58 PM   Specimen: BLOOD LEFT ARM  Result Value Ref Range Status   Specimen Description   Final    BLOOD LEFT ARM Performed at [facility], [place] [address], [place], [place] [address]    Special Requests   Final    BOTTLES DRAWN AEROBIC AND ANAEROBIC Blood Culture adequate  volume Performed at [facility] [address] [place], [place] [address]    Culture   Final    NO GROWTH < 12 HOURS Performed at [facility], [address], [place], [place] [address]    Report Status PENDING  Incomplete    Labs: CBC: Recent Labs  Lab 01/23/22 0638 01/25/22 1834  WBC 4.9 6.0  HGB 14.8 13.6  HCT 45.7 41.1  MCV 92.3 90.9  PLT 283 720   Basic Metabolic Panel: Recent Labs  Lab 01/23/22 0638 01/25/22 1834  NA 141 133*  K 4.7 4.1  CL 107 100  CO2 27 25  GLUCOSE 88 106*  BUN 19 21  CREATININE 0.94 1.01*  CALCIUM 9.1 8.4*   Liver Function Tests: No results for input(s): "AST", "ALT", "ALKPHOS", "BILITOT", "PROT", "ALBUMIN" in the last 168 hours. CBG: No results for input(s): "GLUCAP" in the last 168 hours.  Discharge time spent: greater than 30 minutes.  Signed: Estill Cotta, MD [facility] 01/26/2022

## 2022-01-26 NOTE — TOC Transition Note (Signed)
Transition of Care [facility]) - CM/SW Discharge Note   Patient Details  Name: Becky Mccall MRN: [id number] Date of Birth: September 13, 1929  [facility] CM/SW Contact:  Vassie Moselle, LCSW Phone Number: 01/26/2022, 11:43 AM   Clinical Narrative:    Pt is to discharge to [facility] for SNF. Pt will be going to room 127b. RN to call report to [phone]. CSW spoke with pt's daughter who is agreeable to discharge plan. Pt's son is to transport pt to facility.    Final next level of care: Skilled Nursing Facility Barriers to Discharge: Barriers Resolved   Patient Goals and CMS Choice Patient states their goals for this hospitalization and ongoing recovery are:: For pt to go to SNF CMS Medicare.gov Compare Post Acute Care list provided to:: Patient Represenative (must comment) (Daughter) Choice offered to / list presented to : Patient, Adult Children  Discharge Placement   Existing PASRR number confirmed : 01/22/22          Patient chooses bed at: [facility] Patient to be transferred to facility by: Family Name of family member notified: Daughter, Venia Carbon Patient and family notified of of transfer: 01/26/22  Discharge Plan and Services In-house Referral: NA Discharge Planning Services: CM Consult Post Acute Care Choice: [place]          DME Arranged: N/A DME Agency: NA                  Social Determinants of Health ([place]) Interventions Housing Interventions: Intervention Not Indicated   Readmission Risk Interventions    01/26/2022   11:41 AM 01/22/2022   10:50 AM 01/18/2022   11:24 AM  Readmission Risk Prevention Plan  Post Dischage Appt  Complete Complete  Medication Screening  Complete Complete  Transportation Screening Complete Complete Complete  PCP or Specialist Appt within 5-7 Days Complete    Home Care Screening Complete    Medication Review (RN CM) Complete

## 2022-01-26 NOTE — Plan of Care (Signed)
Patient is discharging to SNF, she is stable and in no distress

## 2022-01-26 NOTE — Progress Notes (Signed)
Report called and given to [place] at the facility

## 2022-01-26 NOTE — Plan of Care (Signed)
Patient is discharging, she is stable and in no distress

## 2022-01-26 NOTE — Progress Notes (Signed)
Mobility Specialist - Progress Note   01/26/22 1008  Mobility  Activity Transferred to/from [facility];Ambulated with assistance in hallway  Level of Assistance Contact guard assist, steadying assist  Assistive Device Front wheel walker  Distance Ambulated (ft) 35 ft  Activity Response Tolerated well  Mobility Referral Yes  $Mobility charge 1 Mobility   Pt received in bed and agreeable to mobility. Ambulation cut short due to pt having sudden urge for BM. Assisted pt to [facility] for a BM. No complaints during session. Pt to bed after session with all needs met & NT in room.   [facility]

## 2022-01-27 DIAGNOSIS — I10 Essential (primary) hypertension: Secondary | ICD-10-CM | POA: Diagnosis not present

## 2022-01-27 DIAGNOSIS — R4181 Age-related cognitive decline: Secondary | ICD-10-CM | POA: Diagnosis not present

## 2022-01-27 DIAGNOSIS — J449 Chronic obstructive pulmonary disease, unspecified: Secondary | ICD-10-CM | POA: Diagnosis not present

## 2022-01-27 DIAGNOSIS — I2510 Atherosclerotic heart disease of native coronary artery without angina pectoris: Secondary | ICD-10-CM | POA: Diagnosis not present

## 2022-01-28 DIAGNOSIS — E785 Hyperlipidemia, unspecified: Secondary | ICD-10-CM | POA: Diagnosis not present

## 2022-01-28 DIAGNOSIS — E559 Vitamin D deficiency, unspecified: Secondary | ICD-10-CM | POA: Diagnosis not present

## 2022-01-28 DIAGNOSIS — E039 Hypothyroidism, unspecified: Secondary | ICD-10-CM | POA: Diagnosis not present

## 2022-01-28 DIAGNOSIS — L22 Diaper dermatitis: Secondary | ICD-10-CM | POA: Diagnosis not present

## 2022-01-29 DIAGNOSIS — R4181 Age-related cognitive decline: Secondary | ICD-10-CM | POA: Diagnosis not present

## 2022-01-29 DIAGNOSIS — I2510 Atherosclerotic heart disease of native coronary artery without angina pectoris: Secondary | ICD-10-CM | POA: Diagnosis not present

## 2022-01-29 DIAGNOSIS — J449 Chronic obstructive pulmonary disease, unspecified: Secondary | ICD-10-CM | POA: Diagnosis not present

## 2022-01-29 DIAGNOSIS — I10 Essential (primary) hypertension: Secondary | ICD-10-CM | POA: Diagnosis not present

## 2022-01-30 LAB — CULTURE, BLOOD (ROUTINE X 2)
Culture: NO GROWTH
Culture: NO GROWTH
Special Requests: ADEQUATE
Special Requests: ADEQUATE

## 2022-02-01 DIAGNOSIS — Z79899 Other long term (current) drug therapy: Secondary | ICD-10-CM | POA: Diagnosis not present

## 2022-02-01 DIAGNOSIS — R42 Dizziness and giddiness: Secondary | ICD-10-CM | POA: Diagnosis not present

## 2022-02-01 DIAGNOSIS — R059 Cough, unspecified: Secondary | ICD-10-CM | POA: Diagnosis not present

## 2022-02-02 DIAGNOSIS — L22 Diaper dermatitis: Secondary | ICD-10-CM | POA: Diagnosis not present

## 2022-02-02 DIAGNOSIS — E039 Hypothyroidism, unspecified: Secondary | ICD-10-CM | POA: Diagnosis not present

## 2022-02-02 DIAGNOSIS — M25522 Pain in left elbow: Secondary | ICD-10-CM | POA: Diagnosis not present

## 2022-02-02 DIAGNOSIS — Z79899 Other long term (current) drug therapy: Secondary | ICD-10-CM | POA: Diagnosis not present

## 2022-02-02 DIAGNOSIS — E559 Vitamin D deficiency, unspecified: Secondary | ICD-10-CM | POA: Diagnosis not present

## 2022-02-02 DIAGNOSIS — E785 Hyperlipidemia, unspecified: Secondary | ICD-10-CM | POA: Diagnosis not present

## 2022-02-04 DIAGNOSIS — K5900 Constipation, unspecified: Secondary | ICD-10-CM | POA: Diagnosis not present

## 2022-02-04 DIAGNOSIS — H04129 Dry eye syndrome of unspecified lacrimal gland: Secondary | ICD-10-CM | POA: Diagnosis not present

## 2022-02-05 DIAGNOSIS — I2510 Atherosclerotic heart disease of native coronary artery without angina pectoris: Secondary | ICD-10-CM | POA: Diagnosis not present

## 2022-02-05 DIAGNOSIS — I10 Essential (primary) hypertension: Secondary | ICD-10-CM | POA: Diagnosis not present

## 2022-02-05 DIAGNOSIS — M6281 Muscle weakness (generalized): Secondary | ICD-10-CM | POA: Diagnosis not present

## 2022-02-05 DIAGNOSIS — J449 Chronic obstructive pulmonary disease, unspecified: Secondary | ICD-10-CM | POA: Diagnosis not present

## 2022-02-12 DIAGNOSIS — M47812 Spondylosis without myelopathy or radiculopathy, cervical region: Secondary | ICD-10-CM | POA: Diagnosis not present

## 2022-02-12 DIAGNOSIS — I6529 Occlusion and stenosis of unspecified carotid artery: Secondary | ICD-10-CM | POA: Diagnosis not present

## 2022-02-12 DIAGNOSIS — J449 Chronic obstructive pulmonary disease, unspecified: Secondary | ICD-10-CM | POA: Diagnosis not present

## 2022-02-12 DIAGNOSIS — E039 Hypothyroidism, unspecified: Secondary | ICD-10-CM | POA: Diagnosis not present

## 2022-02-12 DIAGNOSIS — E559 Vitamin D deficiency, unspecified: Secondary | ICD-10-CM | POA: Diagnosis not present

## 2022-02-12 DIAGNOSIS — K219 Gastro-esophageal reflux disease without esophagitis: Secondary | ICD-10-CM | POA: Diagnosis not present

## 2022-02-12 DIAGNOSIS — E785 Hyperlipidemia, unspecified: Secondary | ICD-10-CM | POA: Diagnosis not present

## 2022-02-12 DIAGNOSIS — I2510 Atherosclerotic heart disease of native coronary artery without angina pectoris: Secondary | ICD-10-CM | POA: Diagnosis not present

## 2022-02-12 DIAGNOSIS — E46 Unspecified protein-calorie malnutrition: Secondary | ICD-10-CM | POA: Diagnosis not present

## 2022-02-12 DIAGNOSIS — Z7982 Long term (current) use of aspirin: Secondary | ICD-10-CM | POA: Diagnosis not present

## 2022-02-12 DIAGNOSIS — U071 COVID-19: Secondary | ICD-10-CM | POA: Diagnosis not present

## 2022-02-12 DIAGNOSIS — M545 Low back pain, unspecified: Secondary | ICD-10-CM | POA: Diagnosis not present

## 2022-02-12 DIAGNOSIS — M5136 Other intervertebral disc degeneration, lumbar region: Secondary | ICD-10-CM | POA: Diagnosis not present

## 2022-02-12 DIAGNOSIS — M25522 Pain in left elbow: Secondary | ICD-10-CM | POA: Diagnosis not present

## 2022-02-12 DIAGNOSIS — F0280 Dementia in other diseases classified elsewhere without behavioral disturbance: Secondary | ICD-10-CM | POA: Diagnosis not present

## 2022-02-12 DIAGNOSIS — I352 Nonrheumatic aortic (valve) stenosis with insufficiency: Secondary | ICD-10-CM | POA: Diagnosis not present

## 2022-02-12 DIAGNOSIS — I10 Essential (primary) hypertension: Secondary | ICD-10-CM | POA: Diagnosis not present

## 2022-02-12 DIAGNOSIS — Z87891 Personal history of nicotine dependence: Secondary | ICD-10-CM | POA: Diagnosis not present

## 2022-02-12 DIAGNOSIS — H9190 Unspecified hearing loss, unspecified ear: Secondary | ICD-10-CM | POA: Diagnosis not present

## 2022-02-12 DIAGNOSIS — G8929 Other chronic pain: Secondary | ICD-10-CM | POA: Diagnosis not present

## 2022-02-12 DIAGNOSIS — I671 Cerebral aneurysm, nonruptured: Secondary | ICD-10-CM | POA: Diagnosis not present

## 2022-03-05 DIAGNOSIS — R54 Age-related physical debility: Secondary | ICD-10-CM | POA: Diagnosis not present

## 2022-03-05 DIAGNOSIS — J449 Chronic obstructive pulmonary disease, unspecified: Secondary | ICD-10-CM | POA: Diagnosis not present

## 2022-03-05 DIAGNOSIS — H6122 Impacted cerumen, left ear: Secondary | ICD-10-CM | POA: Diagnosis not present

## 2022-03-05 DIAGNOSIS — U071 COVID-19: Secondary | ICD-10-CM | POA: Diagnosis not present

## 2022-03-05 DIAGNOSIS — M6281 Muscle weakness (generalized): Secondary | ICD-10-CM | POA: Diagnosis not present

## 2022-03-15 DIAGNOSIS — U071 COVID-19: Secondary | ICD-10-CM | POA: Diagnosis not present

## 2022-03-15 DIAGNOSIS — H9190 Unspecified hearing loss, unspecified ear: Secondary | ICD-10-CM | POA: Diagnosis not present

## 2022-03-15 DIAGNOSIS — M25522 Pain in left elbow: Secondary | ICD-10-CM | POA: Diagnosis not present

## 2022-03-15 DIAGNOSIS — I352 Nonrheumatic aortic (valve) stenosis with insufficiency: Secondary | ICD-10-CM | POA: Diagnosis not present

## 2022-03-15 DIAGNOSIS — M47812 Spondylosis without myelopathy or radiculopathy, cervical region: Secondary | ICD-10-CM | POA: Diagnosis not present

## 2022-03-15 DIAGNOSIS — G8929 Other chronic pain: Secondary | ICD-10-CM | POA: Diagnosis not present

## 2022-03-15 DIAGNOSIS — E559 Vitamin D deficiency, unspecified: Secondary | ICD-10-CM | POA: Diagnosis not present

## 2022-03-15 DIAGNOSIS — Z87891 Personal history of nicotine dependence: Secondary | ICD-10-CM | POA: Diagnosis not present

## 2022-03-15 DIAGNOSIS — J449 Chronic obstructive pulmonary disease, unspecified: Secondary | ICD-10-CM | POA: Diagnosis not present

## 2022-03-15 DIAGNOSIS — E785 Hyperlipidemia, unspecified: Secondary | ICD-10-CM | POA: Diagnosis not present

## 2022-03-15 DIAGNOSIS — E46 Unspecified protein-calorie malnutrition: Secondary | ICD-10-CM | POA: Diagnosis not present

## 2022-03-15 DIAGNOSIS — I671 Cerebral aneurysm, nonruptured: Secondary | ICD-10-CM | POA: Diagnosis not present

## 2022-03-15 DIAGNOSIS — I2510 Atherosclerotic heart disease of native coronary artery without angina pectoris: Secondary | ICD-10-CM | POA: Diagnosis not present

## 2022-03-15 DIAGNOSIS — K219 Gastro-esophageal reflux disease without esophagitis: Secondary | ICD-10-CM | POA: Diagnosis not present

## 2022-03-15 DIAGNOSIS — F0280 Dementia in other diseases classified elsewhere without behavioral disturbance: Secondary | ICD-10-CM | POA: Diagnosis not present

## 2022-03-15 DIAGNOSIS — M545 Low back pain, unspecified: Secondary | ICD-10-CM | POA: Diagnosis not present

## 2022-03-15 DIAGNOSIS — I10 Essential (primary) hypertension: Secondary | ICD-10-CM | POA: Diagnosis not present

## 2022-03-15 DIAGNOSIS — I6529 Occlusion and stenosis of unspecified carotid artery: Secondary | ICD-10-CM | POA: Diagnosis not present

## 2022-03-15 DIAGNOSIS — Z7982 Long term (current) use of aspirin: Secondary | ICD-10-CM | POA: Diagnosis not present

## 2022-03-15 DIAGNOSIS — E039 Hypothyroidism, unspecified: Secondary | ICD-10-CM | POA: Diagnosis not present

## 2022-03-15 DIAGNOSIS — M5136 Other intervertebral disc degeneration, lumbar region: Secondary | ICD-10-CM | POA: Diagnosis not present

## 2022-03-16 DIAGNOSIS — M545 Low back pain, unspecified: Secondary | ICD-10-CM | POA: Diagnosis not present

## 2022-03-16 DIAGNOSIS — J449 Chronic obstructive pulmonary disease, unspecified: Secondary | ICD-10-CM | POA: Diagnosis not present

## 2022-03-16 DIAGNOSIS — F0280 Dementia in other diseases classified elsewhere without behavioral disturbance: Secondary | ICD-10-CM | POA: Diagnosis not present

## 2022-03-16 DIAGNOSIS — E785 Hyperlipidemia, unspecified: Secondary | ICD-10-CM | POA: Diagnosis not present

## 2022-03-16 DIAGNOSIS — M47812 Spondylosis without myelopathy or radiculopathy, cervical region: Secondary | ICD-10-CM | POA: Diagnosis not present

## 2022-03-16 DIAGNOSIS — I671 Cerebral aneurysm, nonruptured: Secondary | ICD-10-CM | POA: Diagnosis not present

## 2022-03-16 DIAGNOSIS — I6529 Occlusion and stenosis of unspecified carotid artery: Secondary | ICD-10-CM | POA: Diagnosis not present

## 2022-03-16 DIAGNOSIS — I2510 Atherosclerotic heart disease of native coronary artery without angina pectoris: Secondary | ICD-10-CM | POA: Diagnosis not present

## 2022-03-16 DIAGNOSIS — E039 Hypothyroidism, unspecified: Secondary | ICD-10-CM | POA: Diagnosis not present

## 2022-03-16 DIAGNOSIS — U071 COVID-19: Secondary | ICD-10-CM | POA: Diagnosis not present

## 2022-03-16 DIAGNOSIS — M25522 Pain in left elbow: Secondary | ICD-10-CM | POA: Diagnosis not present

## 2022-03-16 DIAGNOSIS — I10 Essential (primary) hypertension: Secondary | ICD-10-CM | POA: Diagnosis not present

## 2022-03-16 DIAGNOSIS — G8929 Other chronic pain: Secondary | ICD-10-CM | POA: Diagnosis not present

## 2022-03-16 DIAGNOSIS — I352 Nonrheumatic aortic (valve) stenosis with insufficiency: Secondary | ICD-10-CM | POA: Diagnosis not present

## 2022-03-16 DIAGNOSIS — M5136 Other intervertebral disc degeneration, lumbar region: Secondary | ICD-10-CM | POA: Diagnosis not present

## 2022-03-16 DIAGNOSIS — K219 Gastro-esophageal reflux disease without esophagitis: Secondary | ICD-10-CM | POA: Diagnosis not present

## 2022-03-17 DIAGNOSIS — F0280 Dementia in other diseases classified elsewhere without behavioral disturbance: Secondary | ICD-10-CM | POA: Diagnosis not present

## 2022-03-17 DIAGNOSIS — E785 Hyperlipidemia, unspecified: Secondary | ICD-10-CM | POA: Diagnosis not present

## 2022-03-17 DIAGNOSIS — E039 Hypothyroidism, unspecified: Secondary | ICD-10-CM | POA: Diagnosis not present

## 2022-03-17 DIAGNOSIS — I352 Nonrheumatic aortic (valve) stenosis with insufficiency: Secondary | ICD-10-CM | POA: Diagnosis not present

## 2022-03-17 DIAGNOSIS — M25522 Pain in left elbow: Secondary | ICD-10-CM | POA: Diagnosis not present

## 2022-03-17 DIAGNOSIS — J449 Chronic obstructive pulmonary disease, unspecified: Secondary | ICD-10-CM | POA: Diagnosis not present

## 2022-03-17 DIAGNOSIS — I2510 Atherosclerotic heart disease of native coronary artery without angina pectoris: Secondary | ICD-10-CM | POA: Diagnosis not present

## 2022-03-17 DIAGNOSIS — M5136 Other intervertebral disc degeneration, lumbar region: Secondary | ICD-10-CM | POA: Diagnosis not present

## 2022-03-17 DIAGNOSIS — U071 COVID-19: Secondary | ICD-10-CM | POA: Diagnosis not present

## 2022-03-17 DIAGNOSIS — G8929 Other chronic pain: Secondary | ICD-10-CM | POA: Diagnosis not present

## 2022-03-17 DIAGNOSIS — I6529 Occlusion and stenosis of unspecified carotid artery: Secondary | ICD-10-CM | POA: Diagnosis not present

## 2022-03-17 DIAGNOSIS — I10 Essential (primary) hypertension: Secondary | ICD-10-CM | POA: Diagnosis not present

## 2022-03-17 DIAGNOSIS — M47812 Spondylosis without myelopathy or radiculopathy, cervical region: Secondary | ICD-10-CM | POA: Diagnosis not present

## 2022-03-17 DIAGNOSIS — K219 Gastro-esophageal reflux disease without esophagitis: Secondary | ICD-10-CM | POA: Diagnosis not present

## 2022-03-17 DIAGNOSIS — M545 Low back pain, unspecified: Secondary | ICD-10-CM | POA: Diagnosis not present

## 2022-03-17 DIAGNOSIS — I671 Cerebral aneurysm, nonruptured: Secondary | ICD-10-CM | POA: Diagnosis not present

## 2022-03-18 DIAGNOSIS — E039 Hypothyroidism, unspecified: Secondary | ICD-10-CM | POA: Diagnosis not present

## 2022-03-18 DIAGNOSIS — J449 Chronic obstructive pulmonary disease, unspecified: Secondary | ICD-10-CM | POA: Diagnosis not present

## 2022-03-18 DIAGNOSIS — I10 Essential (primary) hypertension: Secondary | ICD-10-CM | POA: Diagnosis not present

## 2022-03-18 DIAGNOSIS — I6529 Occlusion and stenosis of unspecified carotid artery: Secondary | ICD-10-CM | POA: Diagnosis not present

## 2022-03-18 DIAGNOSIS — I671 Cerebral aneurysm, nonruptured: Secondary | ICD-10-CM | POA: Diagnosis not present

## 2022-03-18 DIAGNOSIS — I2510 Atherosclerotic heart disease of native coronary artery without angina pectoris: Secondary | ICD-10-CM | POA: Diagnosis not present

## 2022-03-18 DIAGNOSIS — M545 Low back pain, unspecified: Secondary | ICD-10-CM | POA: Diagnosis not present

## 2022-03-18 DIAGNOSIS — F0280 Dementia in other diseases classified elsewhere without behavioral disturbance: Secondary | ICD-10-CM | POA: Diagnosis not present

## 2022-03-18 DIAGNOSIS — U071 COVID-19: Secondary | ICD-10-CM | POA: Diagnosis not present

## 2022-03-18 DIAGNOSIS — K219 Gastro-esophageal reflux disease without esophagitis: Secondary | ICD-10-CM | POA: Diagnosis not present

## 2022-03-18 DIAGNOSIS — E785 Hyperlipidemia, unspecified: Secondary | ICD-10-CM | POA: Diagnosis not present

## 2022-03-18 DIAGNOSIS — M5136 Other intervertebral disc degeneration, lumbar region: Secondary | ICD-10-CM | POA: Diagnosis not present

## 2022-03-18 DIAGNOSIS — I352 Nonrheumatic aortic (valve) stenosis with insufficiency: Secondary | ICD-10-CM | POA: Diagnosis not present

## 2022-03-18 DIAGNOSIS — M25522 Pain in left elbow: Secondary | ICD-10-CM | POA: Diagnosis not present

## 2022-03-18 DIAGNOSIS — M47812 Spondylosis without myelopathy or radiculopathy, cervical region: Secondary | ICD-10-CM | POA: Diagnosis not present

## 2022-03-18 DIAGNOSIS — G8929 Other chronic pain: Secondary | ICD-10-CM | POA: Diagnosis not present

## 2022-03-22 DIAGNOSIS — E785 Hyperlipidemia, unspecified: Secondary | ICD-10-CM | POA: Diagnosis not present

## 2022-03-22 DIAGNOSIS — M47812 Spondylosis without myelopathy or radiculopathy, cervical region: Secondary | ICD-10-CM | POA: Diagnosis not present

## 2022-03-22 DIAGNOSIS — U071 COVID-19: Secondary | ICD-10-CM | POA: Diagnosis not present

## 2022-03-22 DIAGNOSIS — I129 Hypertensive chronic kidney disease with stage 1 through stage 4 chronic kidney disease, or unspecified chronic kidney disease: Secondary | ICD-10-CM | POA: Diagnosis not present

## 2022-03-22 DIAGNOSIS — I6529 Occlusion and stenosis of unspecified carotid artery: Secondary | ICD-10-CM | POA: Diagnosis not present

## 2022-03-22 DIAGNOSIS — M545 Low back pain, unspecified: Secondary | ICD-10-CM | POA: Diagnosis not present

## 2022-03-22 DIAGNOSIS — F0280 Dementia in other diseases classified elsewhere without behavioral disturbance: Secondary | ICD-10-CM | POA: Diagnosis not present

## 2022-03-22 DIAGNOSIS — M5136 Other intervertebral disc degeneration, lumbar region: Secondary | ICD-10-CM | POA: Diagnosis not present

## 2022-03-22 DIAGNOSIS — G8929 Other chronic pain: Secondary | ICD-10-CM | POA: Diagnosis not present

## 2022-03-22 DIAGNOSIS — J449 Chronic obstructive pulmonary disease, unspecified: Secondary | ICD-10-CM | POA: Diagnosis not present

## 2022-03-22 DIAGNOSIS — K219 Gastro-esophageal reflux disease without esophagitis: Secondary | ICD-10-CM | POA: Diagnosis not present

## 2022-03-22 DIAGNOSIS — I671 Cerebral aneurysm, nonruptured: Secondary | ICD-10-CM | POA: Diagnosis not present

## 2022-03-22 DIAGNOSIS — N183 Chronic kidney disease, stage 3 unspecified: Secondary | ICD-10-CM | POA: Diagnosis not present

## 2022-03-22 DIAGNOSIS — I2510 Atherosclerotic heart disease of native coronary artery without angina pectoris: Secondary | ICD-10-CM | POA: Diagnosis not present

## 2022-03-22 DIAGNOSIS — I10 Essential (primary) hypertension: Secondary | ICD-10-CM | POA: Diagnosis not present

## 2022-03-22 DIAGNOSIS — M25522 Pain in left elbow: Secondary | ICD-10-CM | POA: Diagnosis not present

## 2022-03-22 DIAGNOSIS — E039 Hypothyroidism, unspecified: Secondary | ICD-10-CM | POA: Diagnosis not present

## 2022-03-22 DIAGNOSIS — I352 Nonrheumatic aortic (valve) stenosis with insufficiency: Secondary | ICD-10-CM | POA: Diagnosis not present

## 2022-03-24 DIAGNOSIS — I10 Essential (primary) hypertension: Secondary | ICD-10-CM | POA: Diagnosis not present

## 2022-03-24 DIAGNOSIS — M545 Low back pain, unspecified: Secondary | ICD-10-CM | POA: Diagnosis not present

## 2022-03-24 DIAGNOSIS — U071 COVID-19: Secondary | ICD-10-CM | POA: Diagnosis not present

## 2022-03-24 DIAGNOSIS — I671 Cerebral aneurysm, nonruptured: Secondary | ICD-10-CM | POA: Diagnosis not present

## 2022-03-24 DIAGNOSIS — E039 Hypothyroidism, unspecified: Secondary | ICD-10-CM | POA: Diagnosis not present

## 2022-03-24 DIAGNOSIS — J449 Chronic obstructive pulmonary disease, unspecified: Secondary | ICD-10-CM | POA: Diagnosis not present

## 2022-03-24 DIAGNOSIS — G8929 Other chronic pain: Secondary | ICD-10-CM | POA: Diagnosis not present

## 2022-03-24 DIAGNOSIS — E785 Hyperlipidemia, unspecified: Secondary | ICD-10-CM | POA: Diagnosis not present

## 2022-03-24 DIAGNOSIS — I352 Nonrheumatic aortic (valve) stenosis with insufficiency: Secondary | ICD-10-CM | POA: Diagnosis not present

## 2022-03-24 DIAGNOSIS — M47812 Spondylosis without myelopathy or radiculopathy, cervical region: Secondary | ICD-10-CM | POA: Diagnosis not present

## 2022-03-24 DIAGNOSIS — F0280 Dementia in other diseases classified elsewhere without behavioral disturbance: Secondary | ICD-10-CM | POA: Diagnosis not present

## 2022-03-24 DIAGNOSIS — M5136 Other intervertebral disc degeneration, lumbar region: Secondary | ICD-10-CM | POA: Diagnosis not present

## 2022-03-24 DIAGNOSIS — M25522 Pain in left elbow: Secondary | ICD-10-CM | POA: Diagnosis not present

## 2022-03-24 DIAGNOSIS — K219 Gastro-esophageal reflux disease without esophagitis: Secondary | ICD-10-CM | POA: Diagnosis not present

## 2022-03-24 DIAGNOSIS — I6529 Occlusion and stenosis of unspecified carotid artery: Secondary | ICD-10-CM | POA: Diagnosis not present

## 2022-03-24 DIAGNOSIS — I2510 Atherosclerotic heart disease of native coronary artery without angina pectoris: Secondary | ICD-10-CM | POA: Diagnosis not present

## 2022-03-26 DIAGNOSIS — K219 Gastro-esophageal reflux disease without esophagitis: Secondary | ICD-10-CM | POA: Diagnosis not present

## 2022-03-26 DIAGNOSIS — F0280 Dementia in other diseases classified elsewhere without behavioral disturbance: Secondary | ICD-10-CM | POA: Diagnosis not present

## 2022-03-26 DIAGNOSIS — M25522 Pain in left elbow: Secondary | ICD-10-CM | POA: Diagnosis not present

## 2022-03-26 DIAGNOSIS — I2510 Atherosclerotic heart disease of native coronary artery without angina pectoris: Secondary | ICD-10-CM | POA: Diagnosis not present

## 2022-03-26 DIAGNOSIS — E785 Hyperlipidemia, unspecified: Secondary | ICD-10-CM | POA: Diagnosis not present

## 2022-03-26 DIAGNOSIS — I671 Cerebral aneurysm, nonruptured: Secondary | ICD-10-CM | POA: Diagnosis not present

## 2022-03-26 DIAGNOSIS — E039 Hypothyroidism, unspecified: Secondary | ICD-10-CM | POA: Diagnosis not present

## 2022-03-26 DIAGNOSIS — M545 Low back pain, unspecified: Secondary | ICD-10-CM | POA: Diagnosis not present

## 2022-03-26 DIAGNOSIS — U071 COVID-19: Secondary | ICD-10-CM | POA: Diagnosis not present

## 2022-03-26 DIAGNOSIS — M47812 Spondylosis without myelopathy or radiculopathy, cervical region: Secondary | ICD-10-CM | POA: Diagnosis not present

## 2022-03-26 DIAGNOSIS — J449 Chronic obstructive pulmonary disease, unspecified: Secondary | ICD-10-CM | POA: Diagnosis not present

## 2022-03-26 DIAGNOSIS — I10 Essential (primary) hypertension: Secondary | ICD-10-CM | POA: Diagnosis not present

## 2022-03-26 DIAGNOSIS — G8929 Other chronic pain: Secondary | ICD-10-CM | POA: Diagnosis not present

## 2022-03-26 DIAGNOSIS — M5136 Other intervertebral disc degeneration, lumbar region: Secondary | ICD-10-CM | POA: Diagnosis not present

## 2022-03-26 DIAGNOSIS — I6529 Occlusion and stenosis of unspecified carotid artery: Secondary | ICD-10-CM | POA: Diagnosis not present

## 2022-03-26 DIAGNOSIS — I352 Nonrheumatic aortic (valve) stenosis with insufficiency: Secondary | ICD-10-CM | POA: Diagnosis not present

## 2022-03-30 DIAGNOSIS — F0280 Dementia in other diseases classified elsewhere without behavioral disturbance: Secondary | ICD-10-CM | POA: Diagnosis not present

## 2022-03-30 DIAGNOSIS — I6529 Occlusion and stenosis of unspecified carotid artery: Secondary | ICD-10-CM | POA: Diagnosis not present

## 2022-03-30 DIAGNOSIS — M545 Low back pain, unspecified: Secondary | ICD-10-CM | POA: Diagnosis not present

## 2022-03-30 DIAGNOSIS — E039 Hypothyroidism, unspecified: Secondary | ICD-10-CM | POA: Diagnosis not present

## 2022-03-30 DIAGNOSIS — K219 Gastro-esophageal reflux disease without esophagitis: Secondary | ICD-10-CM | POA: Diagnosis not present

## 2022-03-30 DIAGNOSIS — I10 Essential (primary) hypertension: Secondary | ICD-10-CM | POA: Diagnosis not present

## 2022-03-30 DIAGNOSIS — J449 Chronic obstructive pulmonary disease, unspecified: Secondary | ICD-10-CM | POA: Diagnosis not present

## 2022-03-30 DIAGNOSIS — I352 Nonrheumatic aortic (valve) stenosis with insufficiency: Secondary | ICD-10-CM | POA: Diagnosis not present

## 2022-03-30 DIAGNOSIS — M5136 Other intervertebral disc degeneration, lumbar region: Secondary | ICD-10-CM | POA: Diagnosis not present

## 2022-03-30 DIAGNOSIS — G8929 Other chronic pain: Secondary | ICD-10-CM | POA: Diagnosis not present

## 2022-03-30 DIAGNOSIS — E785 Hyperlipidemia, unspecified: Secondary | ICD-10-CM | POA: Diagnosis not present

## 2022-03-30 DIAGNOSIS — M25522 Pain in left elbow: Secondary | ICD-10-CM | POA: Diagnosis not present

## 2022-03-30 DIAGNOSIS — I671 Cerebral aneurysm, nonruptured: Secondary | ICD-10-CM | POA: Diagnosis not present

## 2022-03-30 DIAGNOSIS — I2510 Atherosclerotic heart disease of native coronary artery without angina pectoris: Secondary | ICD-10-CM | POA: Diagnosis not present

## 2022-03-30 DIAGNOSIS — U071 COVID-19: Secondary | ICD-10-CM | POA: Diagnosis not present

## 2022-03-30 DIAGNOSIS — M47812 Spondylosis without myelopathy or radiculopathy, cervical region: Secondary | ICD-10-CM | POA: Diagnosis not present

## 2022-03-31 DIAGNOSIS — I10 Essential (primary) hypertension: Secondary | ICD-10-CM | POA: Diagnosis not present

## 2022-03-31 DIAGNOSIS — M545 Low back pain, unspecified: Secondary | ICD-10-CM | POA: Diagnosis not present

## 2022-03-31 DIAGNOSIS — I6529 Occlusion and stenosis of unspecified carotid artery: Secondary | ICD-10-CM | POA: Diagnosis not present

## 2022-03-31 DIAGNOSIS — M25522 Pain in left elbow: Secondary | ICD-10-CM | POA: Diagnosis not present

## 2022-03-31 DIAGNOSIS — K219 Gastro-esophageal reflux disease without esophagitis: Secondary | ICD-10-CM | POA: Diagnosis not present

## 2022-03-31 DIAGNOSIS — I2510 Atherosclerotic heart disease of native coronary artery without angina pectoris: Secondary | ICD-10-CM | POA: Diagnosis not present

## 2022-03-31 DIAGNOSIS — G8929 Other chronic pain: Secondary | ICD-10-CM | POA: Diagnosis not present

## 2022-03-31 DIAGNOSIS — M5136 Other intervertebral disc degeneration, lumbar region: Secondary | ICD-10-CM | POA: Diagnosis not present

## 2022-03-31 DIAGNOSIS — U071 COVID-19: Secondary | ICD-10-CM | POA: Diagnosis not present

## 2022-03-31 DIAGNOSIS — I352 Nonrheumatic aortic (valve) stenosis with insufficiency: Secondary | ICD-10-CM | POA: Diagnosis not present

## 2022-03-31 DIAGNOSIS — F0280 Dementia in other diseases classified elsewhere without behavioral disturbance: Secondary | ICD-10-CM | POA: Diagnosis not present

## 2022-03-31 DIAGNOSIS — J449 Chronic obstructive pulmonary disease, unspecified: Secondary | ICD-10-CM | POA: Diagnosis not present

## 2022-03-31 DIAGNOSIS — E785 Hyperlipidemia, unspecified: Secondary | ICD-10-CM | POA: Diagnosis not present

## 2022-03-31 DIAGNOSIS — M47812 Spondylosis without myelopathy or radiculopathy, cervical region: Secondary | ICD-10-CM | POA: Diagnosis not present

## 2022-03-31 DIAGNOSIS — I671 Cerebral aneurysm, nonruptured: Secondary | ICD-10-CM | POA: Diagnosis not present

## 2022-03-31 DIAGNOSIS — E039 Hypothyroidism, unspecified: Secondary | ICD-10-CM | POA: Diagnosis not present

## 2022-04-01 DIAGNOSIS — K219 Gastro-esophageal reflux disease without esophagitis: Secondary | ICD-10-CM | POA: Diagnosis not present

## 2022-04-01 DIAGNOSIS — U071 COVID-19: Secondary | ICD-10-CM | POA: Diagnosis not present

## 2022-04-01 DIAGNOSIS — I10 Essential (primary) hypertension: Secondary | ICD-10-CM | POA: Diagnosis not present

## 2022-04-01 DIAGNOSIS — I2510 Atherosclerotic heart disease of native coronary artery without angina pectoris: Secondary | ICD-10-CM | POA: Diagnosis not present

## 2022-04-01 DIAGNOSIS — M545 Low back pain, unspecified: Secondary | ICD-10-CM | POA: Diagnosis not present

## 2022-04-01 DIAGNOSIS — I671 Cerebral aneurysm, nonruptured: Secondary | ICD-10-CM | POA: Diagnosis not present

## 2022-04-01 DIAGNOSIS — M5136 Other intervertebral disc degeneration, lumbar region: Secondary | ICD-10-CM | POA: Diagnosis not present

## 2022-04-01 DIAGNOSIS — G8929 Other chronic pain: Secondary | ICD-10-CM | POA: Diagnosis not present

## 2022-04-01 DIAGNOSIS — M47812 Spondylosis without myelopathy or radiculopathy, cervical region: Secondary | ICD-10-CM | POA: Diagnosis not present

## 2022-04-01 DIAGNOSIS — I6529 Occlusion and stenosis of unspecified carotid artery: Secondary | ICD-10-CM | POA: Diagnosis not present

## 2022-04-01 DIAGNOSIS — I352 Nonrheumatic aortic (valve) stenosis with insufficiency: Secondary | ICD-10-CM | POA: Diagnosis not present

## 2022-04-01 DIAGNOSIS — E039 Hypothyroidism, unspecified: Secondary | ICD-10-CM | POA: Diagnosis not present

## 2022-04-01 DIAGNOSIS — M25522 Pain in left elbow: Secondary | ICD-10-CM | POA: Diagnosis not present

## 2022-04-01 DIAGNOSIS — F0280 Dementia in other diseases classified elsewhere without behavioral disturbance: Secondary | ICD-10-CM | POA: Diagnosis not present

## 2022-04-01 DIAGNOSIS — E785 Hyperlipidemia, unspecified: Secondary | ICD-10-CM | POA: Diagnosis not present

## 2022-04-01 DIAGNOSIS — J449 Chronic obstructive pulmonary disease, unspecified: Secondary | ICD-10-CM | POA: Diagnosis not present

## 2022-04-05 DIAGNOSIS — G8929 Other chronic pain: Secondary | ICD-10-CM | POA: Diagnosis not present

## 2022-04-05 DIAGNOSIS — I2510 Atherosclerotic heart disease of native coronary artery without angina pectoris: Secondary | ICD-10-CM | POA: Diagnosis not present

## 2022-04-05 DIAGNOSIS — M25522 Pain in left elbow: Secondary | ICD-10-CM | POA: Diagnosis not present

## 2022-04-05 DIAGNOSIS — E785 Hyperlipidemia, unspecified: Secondary | ICD-10-CM | POA: Diagnosis not present

## 2022-04-05 DIAGNOSIS — I6529 Occlusion and stenosis of unspecified carotid artery: Secondary | ICD-10-CM | POA: Diagnosis not present

## 2022-04-05 DIAGNOSIS — K219 Gastro-esophageal reflux disease without esophagitis: Secondary | ICD-10-CM | POA: Diagnosis not present

## 2022-04-05 DIAGNOSIS — M5136 Other intervertebral disc degeneration, lumbar region: Secondary | ICD-10-CM | POA: Diagnosis not present

## 2022-04-05 DIAGNOSIS — I671 Cerebral aneurysm, nonruptured: Secondary | ICD-10-CM | POA: Diagnosis not present

## 2022-04-05 DIAGNOSIS — E039 Hypothyroidism, unspecified: Secondary | ICD-10-CM | POA: Diagnosis not present

## 2022-04-05 DIAGNOSIS — M545 Low back pain, unspecified: Secondary | ICD-10-CM | POA: Diagnosis not present

## 2022-04-05 DIAGNOSIS — F0280 Dementia in other diseases classified elsewhere without behavioral disturbance: Secondary | ICD-10-CM | POA: Diagnosis not present

## 2022-04-05 DIAGNOSIS — I10 Essential (primary) hypertension: Secondary | ICD-10-CM | POA: Diagnosis not present

## 2022-04-05 DIAGNOSIS — M47812 Spondylosis without myelopathy or radiculopathy, cervical region: Secondary | ICD-10-CM | POA: Diagnosis not present

## 2022-04-05 DIAGNOSIS — J449 Chronic obstructive pulmonary disease, unspecified: Secondary | ICD-10-CM | POA: Diagnosis not present

## 2022-04-05 DIAGNOSIS — I352 Nonrheumatic aortic (valve) stenosis with insufficiency: Secondary | ICD-10-CM | POA: Diagnosis not present

## 2022-04-05 DIAGNOSIS — U071 COVID-19: Secondary | ICD-10-CM | POA: Diagnosis not present

## 2022-04-06 DIAGNOSIS — I2510 Atherosclerotic heart disease of native coronary artery without angina pectoris: Secondary | ICD-10-CM | POA: Diagnosis not present

## 2022-04-06 DIAGNOSIS — I6529 Occlusion and stenosis of unspecified carotid artery: Secondary | ICD-10-CM | POA: Diagnosis not present

## 2022-04-06 DIAGNOSIS — I671 Cerebral aneurysm, nonruptured: Secondary | ICD-10-CM | POA: Diagnosis not present

## 2022-04-06 DIAGNOSIS — M5136 Other intervertebral disc degeneration, lumbar region: Secondary | ICD-10-CM | POA: Diagnosis not present

## 2022-04-06 DIAGNOSIS — E039 Hypothyroidism, unspecified: Secondary | ICD-10-CM | POA: Diagnosis not present

## 2022-04-06 DIAGNOSIS — M545 Low back pain, unspecified: Secondary | ICD-10-CM | POA: Diagnosis not present

## 2022-04-06 DIAGNOSIS — G8929 Other chronic pain: Secondary | ICD-10-CM | POA: Diagnosis not present

## 2022-04-06 DIAGNOSIS — E785 Hyperlipidemia, unspecified: Secondary | ICD-10-CM | POA: Diagnosis not present

## 2022-04-06 DIAGNOSIS — K219 Gastro-esophageal reflux disease without esophagitis: Secondary | ICD-10-CM | POA: Diagnosis not present

## 2022-04-06 DIAGNOSIS — M25522 Pain in left elbow: Secondary | ICD-10-CM | POA: Diagnosis not present

## 2022-04-06 DIAGNOSIS — I10 Essential (primary) hypertension: Secondary | ICD-10-CM | POA: Diagnosis not present

## 2022-04-06 DIAGNOSIS — F0280 Dementia in other diseases classified elsewhere without behavioral disturbance: Secondary | ICD-10-CM | POA: Diagnosis not present

## 2022-04-06 DIAGNOSIS — U071 COVID-19: Secondary | ICD-10-CM | POA: Diagnosis not present

## 2022-04-06 DIAGNOSIS — M47812 Spondylosis without myelopathy or radiculopathy, cervical region: Secondary | ICD-10-CM | POA: Diagnosis not present

## 2022-04-06 DIAGNOSIS — I352 Nonrheumatic aortic (valve) stenosis with insufficiency: Secondary | ICD-10-CM | POA: Diagnosis not present

## 2022-04-06 DIAGNOSIS — J449 Chronic obstructive pulmonary disease, unspecified: Secondary | ICD-10-CM | POA: Diagnosis not present

## 2022-04-08 DIAGNOSIS — K219 Gastro-esophageal reflux disease without esophagitis: Secondary | ICD-10-CM | POA: Diagnosis not present

## 2022-04-08 DIAGNOSIS — E039 Hypothyroidism, unspecified: Secondary | ICD-10-CM | POA: Diagnosis not present

## 2022-04-08 DIAGNOSIS — I10 Essential (primary) hypertension: Secondary | ICD-10-CM | POA: Diagnosis not present

## 2022-04-08 DIAGNOSIS — G8929 Other chronic pain: Secondary | ICD-10-CM | POA: Diagnosis not present

## 2022-04-08 DIAGNOSIS — U071 COVID-19: Secondary | ICD-10-CM | POA: Diagnosis not present

## 2022-04-08 DIAGNOSIS — M25522 Pain in left elbow: Secondary | ICD-10-CM | POA: Diagnosis not present

## 2022-04-08 DIAGNOSIS — M545 Low back pain, unspecified: Secondary | ICD-10-CM | POA: Diagnosis not present

## 2022-04-08 DIAGNOSIS — I2510 Atherosclerotic heart disease of native coronary artery without angina pectoris: Secondary | ICD-10-CM | POA: Diagnosis not present

## 2022-04-08 DIAGNOSIS — I352 Nonrheumatic aortic (valve) stenosis with insufficiency: Secondary | ICD-10-CM | POA: Diagnosis not present

## 2022-04-08 DIAGNOSIS — E785 Hyperlipidemia, unspecified: Secondary | ICD-10-CM | POA: Diagnosis not present

## 2022-04-08 DIAGNOSIS — I6529 Occlusion and stenosis of unspecified carotid artery: Secondary | ICD-10-CM | POA: Diagnosis not present

## 2022-04-08 DIAGNOSIS — J449 Chronic obstructive pulmonary disease, unspecified: Secondary | ICD-10-CM | POA: Diagnosis not present

## 2022-04-08 DIAGNOSIS — I671 Cerebral aneurysm, nonruptured: Secondary | ICD-10-CM | POA: Diagnosis not present

## 2022-04-08 DIAGNOSIS — F0280 Dementia in other diseases classified elsewhere without behavioral disturbance: Secondary | ICD-10-CM | POA: Diagnosis not present

## 2022-04-08 DIAGNOSIS — M47812 Spondylosis without myelopathy or radiculopathy, cervical region: Secondary | ICD-10-CM | POA: Diagnosis not present

## 2022-04-08 DIAGNOSIS — M5136 Other intervertebral disc degeneration, lumbar region: Secondary | ICD-10-CM | POA: Diagnosis not present

## 2022-04-22 DIAGNOSIS — H9190 Unspecified hearing loss, unspecified ear: Secondary | ICD-10-CM | POA: Diagnosis not present

## 2022-04-22 DIAGNOSIS — I6529 Occlusion and stenosis of unspecified carotid artery: Secondary | ICD-10-CM | POA: Diagnosis not present

## 2022-04-22 DIAGNOSIS — E785 Hyperlipidemia, unspecified: Secondary | ICD-10-CM | POA: Diagnosis not present

## 2022-04-22 DIAGNOSIS — I2510 Atherosclerotic heart disease of native coronary artery without angina pectoris: Secondary | ICD-10-CM | POA: Diagnosis not present

## 2022-04-22 DIAGNOSIS — Z87891 Personal history of nicotine dependence: Secondary | ICD-10-CM | POA: Diagnosis not present

## 2022-04-22 DIAGNOSIS — F0280 Dementia in other diseases classified elsewhere without behavioral disturbance: Secondary | ICD-10-CM | POA: Diagnosis not present

## 2022-04-22 DIAGNOSIS — K219 Gastro-esophageal reflux disease without esophagitis: Secondary | ICD-10-CM | POA: Diagnosis not present

## 2022-04-22 DIAGNOSIS — M47812 Spondylosis without myelopathy or radiculopathy, cervical region: Secondary | ICD-10-CM | POA: Diagnosis not present

## 2022-04-22 DIAGNOSIS — Z8616 Personal history of COVID-19: Secondary | ICD-10-CM | POA: Diagnosis not present

## 2022-04-22 DIAGNOSIS — I10 Essential (primary) hypertension: Secondary | ICD-10-CM | POA: Diagnosis not present

## 2022-04-22 DIAGNOSIS — J449 Chronic obstructive pulmonary disease, unspecified: Secondary | ICD-10-CM | POA: Diagnosis not present

## 2022-04-22 DIAGNOSIS — Z7982 Long term (current) use of aspirin: Secondary | ICD-10-CM | POA: Diagnosis not present

## 2022-04-22 DIAGNOSIS — G8929 Other chronic pain: Secondary | ICD-10-CM | POA: Diagnosis not present

## 2022-04-22 DIAGNOSIS — I352 Nonrheumatic aortic (valve) stenosis with insufficiency: Secondary | ICD-10-CM | POA: Diagnosis not present

## 2022-04-22 DIAGNOSIS — E46 Unspecified protein-calorie malnutrition: Secondary | ICD-10-CM | POA: Diagnosis not present

## 2022-04-22 DIAGNOSIS — E559 Vitamin D deficiency, unspecified: Secondary | ICD-10-CM | POA: Diagnosis not present

## 2022-04-22 DIAGNOSIS — M5136 Other intervertebral disc degeneration, lumbar region: Secondary | ICD-10-CM | POA: Diagnosis not present

## 2022-04-22 DIAGNOSIS — M545 Low back pain, unspecified: Secondary | ICD-10-CM | POA: Diagnosis not present

## 2022-04-22 DIAGNOSIS — I671 Cerebral aneurysm, nonruptured: Secondary | ICD-10-CM | POA: Diagnosis not present

## 2022-04-22 DIAGNOSIS — Z9181 History of falling: Secondary | ICD-10-CM | POA: Diagnosis not present

## 2022-04-22 DIAGNOSIS — E039 Hypothyroidism, unspecified: Secondary | ICD-10-CM | POA: Diagnosis not present

## 2022-04-28 DIAGNOSIS — I10 Essential (primary) hypertension: Secondary | ICD-10-CM | POA: Diagnosis not present

## 2022-04-28 DIAGNOSIS — I352 Nonrheumatic aortic (valve) stenosis with insufficiency: Secondary | ICD-10-CM | POA: Diagnosis not present

## 2022-04-28 DIAGNOSIS — G8929 Other chronic pain: Secondary | ICD-10-CM | POA: Diagnosis not present

## 2022-04-28 DIAGNOSIS — F0280 Dementia in other diseases classified elsewhere without behavioral disturbance: Secondary | ICD-10-CM | POA: Diagnosis not present

## 2022-04-28 DIAGNOSIS — I6529 Occlusion and stenosis of unspecified carotid artery: Secondary | ICD-10-CM | POA: Diagnosis not present

## 2022-04-28 DIAGNOSIS — J449 Chronic obstructive pulmonary disease, unspecified: Secondary | ICD-10-CM | POA: Diagnosis not present

## 2022-04-28 DIAGNOSIS — M5136 Other intervertebral disc degeneration, lumbar region: Secondary | ICD-10-CM | POA: Diagnosis not present

## 2022-04-28 DIAGNOSIS — E46 Unspecified protein-calorie malnutrition: Secondary | ICD-10-CM | POA: Diagnosis not present

## 2022-04-28 DIAGNOSIS — E039 Hypothyroidism, unspecified: Secondary | ICD-10-CM | POA: Diagnosis not present

## 2022-04-28 DIAGNOSIS — E785 Hyperlipidemia, unspecified: Secondary | ICD-10-CM | POA: Diagnosis not present

## 2022-04-28 DIAGNOSIS — H9190 Unspecified hearing loss, unspecified ear: Secondary | ICD-10-CM | POA: Diagnosis not present

## 2022-04-28 DIAGNOSIS — M545 Low back pain, unspecified: Secondary | ICD-10-CM | POA: Diagnosis not present

## 2022-04-28 DIAGNOSIS — I2510 Atherosclerotic heart disease of native coronary artery without angina pectoris: Secondary | ICD-10-CM | POA: Diagnosis not present

## 2022-04-28 DIAGNOSIS — K219 Gastro-esophageal reflux disease without esophagitis: Secondary | ICD-10-CM | POA: Diagnosis not present

## 2022-04-28 DIAGNOSIS — I671 Cerebral aneurysm, nonruptured: Secondary | ICD-10-CM | POA: Diagnosis not present

## 2022-04-28 DIAGNOSIS — M47812 Spondylosis without myelopathy or radiculopathy, cervical region: Secondary | ICD-10-CM | POA: Diagnosis not present

## 2022-05-05 DIAGNOSIS — K219 Gastro-esophageal reflux disease without esophagitis: Secondary | ICD-10-CM | POA: Diagnosis not present

## 2022-05-05 DIAGNOSIS — E46 Unspecified protein-calorie malnutrition: Secondary | ICD-10-CM | POA: Diagnosis not present

## 2022-05-05 DIAGNOSIS — M5136 Other intervertebral disc degeneration, lumbar region: Secondary | ICD-10-CM | POA: Diagnosis not present

## 2022-05-05 DIAGNOSIS — G8929 Other chronic pain: Secondary | ICD-10-CM | POA: Diagnosis not present

## 2022-05-05 DIAGNOSIS — I671 Cerebral aneurysm, nonruptured: Secondary | ICD-10-CM | POA: Diagnosis not present

## 2022-05-05 DIAGNOSIS — M47812 Spondylosis without myelopathy or radiculopathy, cervical region: Secondary | ICD-10-CM | POA: Diagnosis not present

## 2022-05-05 DIAGNOSIS — I2510 Atherosclerotic heart disease of native coronary artery without angina pectoris: Secondary | ICD-10-CM | POA: Diagnosis not present

## 2022-05-05 DIAGNOSIS — E039 Hypothyroidism, unspecified: Secondary | ICD-10-CM | POA: Diagnosis not present

## 2022-05-05 DIAGNOSIS — E785 Hyperlipidemia, unspecified: Secondary | ICD-10-CM | POA: Diagnosis not present

## 2022-05-05 DIAGNOSIS — H9190 Unspecified hearing loss, unspecified ear: Secondary | ICD-10-CM | POA: Diagnosis not present

## 2022-05-05 DIAGNOSIS — I6529 Occlusion and stenosis of unspecified carotid artery: Secondary | ICD-10-CM | POA: Diagnosis not present

## 2022-05-05 DIAGNOSIS — F0280 Dementia in other diseases classified elsewhere without behavioral disturbance: Secondary | ICD-10-CM | POA: Diagnosis not present

## 2022-05-05 DIAGNOSIS — M545 Low back pain, unspecified: Secondary | ICD-10-CM | POA: Diagnosis not present

## 2022-05-05 DIAGNOSIS — I352 Nonrheumatic aortic (valve) stenosis with insufficiency: Secondary | ICD-10-CM | POA: Diagnosis not present

## 2022-05-05 DIAGNOSIS — J449 Chronic obstructive pulmonary disease, unspecified: Secondary | ICD-10-CM | POA: Diagnosis not present

## 2022-05-05 DIAGNOSIS — I10 Essential (primary) hypertension: Secondary | ICD-10-CM | POA: Diagnosis not present

## 2022-05-11 DIAGNOSIS — K219 Gastro-esophageal reflux disease without esophagitis: Secondary | ICD-10-CM | POA: Diagnosis not present

## 2022-05-11 DIAGNOSIS — E039 Hypothyroidism, unspecified: Secondary | ICD-10-CM | POA: Diagnosis not present

## 2022-05-11 DIAGNOSIS — M5136 Other intervertebral disc degeneration, lumbar region: Secondary | ICD-10-CM | POA: Diagnosis not present

## 2022-05-11 DIAGNOSIS — E46 Unspecified protein-calorie malnutrition: Secondary | ICD-10-CM | POA: Diagnosis not present

## 2022-05-11 DIAGNOSIS — I352 Nonrheumatic aortic (valve) stenosis with insufficiency: Secondary | ICD-10-CM | POA: Diagnosis not present

## 2022-05-11 DIAGNOSIS — I2510 Atherosclerotic heart disease of native coronary artery without angina pectoris: Secondary | ICD-10-CM | POA: Diagnosis not present

## 2022-05-11 DIAGNOSIS — M47812 Spondylosis without myelopathy or radiculopathy, cervical region: Secondary | ICD-10-CM | POA: Diagnosis not present

## 2022-05-11 DIAGNOSIS — I10 Essential (primary) hypertension: Secondary | ICD-10-CM | POA: Diagnosis not present

## 2022-05-11 DIAGNOSIS — G8929 Other chronic pain: Secondary | ICD-10-CM | POA: Diagnosis not present

## 2022-05-11 DIAGNOSIS — M545 Low back pain, unspecified: Secondary | ICD-10-CM | POA: Diagnosis not present

## 2022-05-11 DIAGNOSIS — F0280 Dementia in other diseases classified elsewhere without behavioral disturbance: Secondary | ICD-10-CM | POA: Diagnosis not present

## 2022-05-11 DIAGNOSIS — I671 Cerebral aneurysm, nonruptured: Secondary | ICD-10-CM | POA: Diagnosis not present

## 2022-05-11 DIAGNOSIS — H9190 Unspecified hearing loss, unspecified ear: Secondary | ICD-10-CM | POA: Diagnosis not present

## 2022-05-11 DIAGNOSIS — J449 Chronic obstructive pulmonary disease, unspecified: Secondary | ICD-10-CM | POA: Diagnosis not present

## 2022-05-11 DIAGNOSIS — I6529 Occlusion and stenosis of unspecified carotid artery: Secondary | ICD-10-CM | POA: Diagnosis not present

## 2022-05-11 DIAGNOSIS — E785 Hyperlipidemia, unspecified: Secondary | ICD-10-CM | POA: Diagnosis not present

## 2022-05-21 ENCOUNTER — Encounter (HOSPITAL_COMMUNITY): Payer: Self-pay

## 2022-05-21 ENCOUNTER — Inpatient Hospital Stay (HOSPITAL_COMMUNITY): Payer: Medicare Other

## 2022-05-21 ENCOUNTER — Emergency Department (HOSPITAL_COMMUNITY): Payer: Medicare Other

## 2022-05-21 ENCOUNTER — Inpatient Hospital Stay (HOSPITAL_COMMUNITY)
Admission: EM | Admit: 2022-05-21 | Discharge: 2022-05-25 | DRG: 482 | Disposition: A | Discharge status: Skilled Nursing Facility | Payer: Medicare Other | Attending: Internal Medicine | Admitting: Internal Medicine

## 2022-05-21 ENCOUNTER — Other Ambulatory Visit: Payer: Self-pay

## 2022-05-21 DIAGNOSIS — S72002A Fracture of unspecified part of neck of left femur, initial encounter for closed fracture: Secondary | ICD-10-CM | POA: Diagnosis not present

## 2022-05-21 DIAGNOSIS — Z981 Arthrodesis status: Secondary | ICD-10-CM

## 2022-05-21 DIAGNOSIS — I10 Essential (primary) hypertension: Secondary | ICD-10-CM | POA: Diagnosis present

## 2022-05-21 DIAGNOSIS — E039 Hypothyroidism, unspecified: Secondary | ICD-10-CM | POA: Diagnosis not present

## 2022-05-21 DIAGNOSIS — Z953 Presence of xenogenic heart valve: Secondary | ICD-10-CM

## 2022-05-21 DIAGNOSIS — Z974 Presence of external hearing-aid: Secondary | ICD-10-CM

## 2022-05-21 DIAGNOSIS — Z952 Presence of prosthetic heart valve: Secondary | ICD-10-CM

## 2022-05-21 DIAGNOSIS — Z801 Family history of malignant neoplasm of trachea, bronchus and lung: Secondary | ICD-10-CM

## 2022-05-21 DIAGNOSIS — Z79890 Hormone replacement therapy (postmenopausal): Secondary | ICD-10-CM | POA: Diagnosis not present

## 2022-05-21 DIAGNOSIS — N182 Chronic kidney disease, stage 2 (mild): Secondary | ICD-10-CM | POA: Diagnosis present

## 2022-05-21 DIAGNOSIS — Y92000 Kitchen of unspecified non-institutional (private) residence as  the place of occurrence of the external cause: Secondary | ICD-10-CM | POA: Diagnosis not present

## 2022-05-21 DIAGNOSIS — M545 Low back pain, unspecified: Secondary | ICD-10-CM | POA: Diagnosis present

## 2022-05-21 DIAGNOSIS — Y92019 Unspecified place in single-family (private) house as the place of occurrence of the external cause: Secondary | ICD-10-CM

## 2022-05-21 DIAGNOSIS — R0902 Hypoxemia: Secondary | ICD-10-CM | POA: Diagnosis not present

## 2022-05-21 DIAGNOSIS — M51369 Other intervertebral disc degeneration, lumbar region without mention of lumbar back pain or lower extremity pain: Secondary | ICD-10-CM | POA: Diagnosis present

## 2022-05-21 DIAGNOSIS — Z96652 Presence of left artificial knee joint: Secondary | ICD-10-CM | POA: Diagnosis present

## 2022-05-21 DIAGNOSIS — S0990XA Unspecified injury of head, initial encounter: Secondary | ICD-10-CM | POA: Diagnosis not present

## 2022-05-21 DIAGNOSIS — J449 Chronic obstructive pulmonary disease, unspecified: Secondary | ICD-10-CM | POA: Diagnosis not present

## 2022-05-21 DIAGNOSIS — Z90710 Acquired absence of both cervix and uterus: Secondary | ICD-10-CM | POA: Diagnosis not present

## 2022-05-21 DIAGNOSIS — W010XXA Fall on same level from slipping, tripping and stumbling without subsequent striking against object, initial encounter: Secondary | ICD-10-CM | POA: Diagnosis present

## 2022-05-21 DIAGNOSIS — Z471 Aftercare following joint replacement surgery: Secondary | ICD-10-CM | POA: Diagnosis not present

## 2022-05-21 DIAGNOSIS — S728X2A Other fracture of left femur, initial encounter for closed fracture: Secondary | ICD-10-CM | POA: Diagnosis present

## 2022-05-21 DIAGNOSIS — Z66 Do not resuscitate: Secondary | ICD-10-CM | POA: Diagnosis present

## 2022-05-21 DIAGNOSIS — I2510 Atherosclerotic heart disease of native coronary artery without angina pectoris: Secondary | ICD-10-CM | POA: Diagnosis present

## 2022-05-21 DIAGNOSIS — M5136 Other intervertebral disc degeneration, lumbar region: Secondary | ICD-10-CM | POA: Diagnosis present

## 2022-05-21 DIAGNOSIS — Z9181 History of falling: Secondary | ICD-10-CM | POA: Diagnosis not present

## 2022-05-21 DIAGNOSIS — K589 Irritable bowel syndrome without diarrhea: Secondary | ICD-10-CM | POA: Diagnosis present

## 2022-05-21 DIAGNOSIS — Z7982 Long term (current) use of aspirin: Secondary | ICD-10-CM

## 2022-05-21 DIAGNOSIS — W19XXXA Unspecified fall, initial encounter: Secondary | ICD-10-CM | POA: Diagnosis not present

## 2022-05-21 DIAGNOSIS — S72012A Unspecified intracapsular fracture of left femur, initial encounter for closed fracture: Secondary | ICD-10-CM | POA: Diagnosis not present

## 2022-05-21 DIAGNOSIS — G8929 Other chronic pain: Secondary | ICD-10-CM | POA: Diagnosis not present

## 2022-05-21 DIAGNOSIS — Z7401 Bed confinement status: Secondary | ICD-10-CM | POA: Diagnosis not present

## 2022-05-21 DIAGNOSIS — Z8249 Family history of ischemic heart disease and other diseases of the circulatory system: Secondary | ICD-10-CM

## 2022-05-21 DIAGNOSIS — K5900 Constipation, unspecified: Secondary | ICD-10-CM | POA: Diagnosis not present

## 2022-05-21 DIAGNOSIS — Z961 Presence of intraocular lens: Secondary | ICD-10-CM | POA: Diagnosis present

## 2022-05-21 DIAGNOSIS — K219 Gastro-esophageal reflux disease without esophagitis: Secondary | ICD-10-CM | POA: Diagnosis not present

## 2022-05-21 DIAGNOSIS — Z87891 Personal history of nicotine dependence: Secondary | ICD-10-CM

## 2022-05-21 DIAGNOSIS — I129 Hypertensive chronic kidney disease with stage 1 through stage 4 chronic kidney disease, or unspecified chronic kidney disease: Secondary | ICD-10-CM | POA: Diagnosis present

## 2022-05-21 DIAGNOSIS — E785 Hyperlipidemia, unspecified: Secondary | ICD-10-CM | POA: Diagnosis present

## 2022-05-21 DIAGNOSIS — M8580 Other specified disorders of bone density and structure, unspecified site: Secondary | ICD-10-CM | POA: Diagnosis present

## 2022-05-21 DIAGNOSIS — Z602 Problems related to living alone: Secondary | ICD-10-CM | POA: Diagnosis not present

## 2022-05-21 DIAGNOSIS — S72002D Fracture of unspecified part of neck of left femur, subsequent encounter for closed fracture with routine healing: Secondary | ICD-10-CM | POA: Diagnosis not present

## 2022-05-21 DIAGNOSIS — F0390 Unspecified dementia without behavioral disturbance: Secondary | ICD-10-CM | POA: Diagnosis present

## 2022-05-21 DIAGNOSIS — S72012D Unspecified intracapsular fracture of left femur, subsequent encounter for closed fracture with routine healing: Secondary | ICD-10-CM | POA: Diagnosis not present

## 2022-05-21 DIAGNOSIS — Z91048 Other nonmedicinal substance allergy status: Secondary | ICD-10-CM | POA: Diagnosis not present

## 2022-05-21 DIAGNOSIS — Z79899 Other long term (current) drug therapy: Secondary | ICD-10-CM

## 2022-05-21 DIAGNOSIS — R410 Disorientation, unspecified: Secondary | ICD-10-CM | POA: Diagnosis not present

## 2022-05-21 DIAGNOSIS — H918X3 Other specified hearing loss, bilateral: Secondary | ICD-10-CM | POA: Diagnosis present

## 2022-05-21 DIAGNOSIS — M25552 Pain in left hip: Secondary | ICD-10-CM | POA: Diagnosis not present

## 2022-05-21 LAB — CBC WITH DIFFERENTIAL/PLATELET
Abs Immature Granulocytes: 0.05 10*3/uL (ref 0.00–0.07)
Basophils Absolute: 0 10*3/uL (ref 0.0–0.1)
Basophils Relative: 0 %
Eosinophils Absolute: 0 10*3/uL (ref 0.0–0.5)
Eosinophils Relative: 0 %
HCT: 42.6 % (ref 36.0–46.0)
Hemoglobin: 13.9 g/dL (ref 12.0–15.0)
Immature Granulocytes: 1 %
Lymphocytes Relative: 8 %
Lymphs Abs: 0.8 10*3/uL (ref 0.7–4.0)
MCH: 31 pg (ref 26.0–34.0)
MCHC: 32.6 g/dL (ref 30.0–36.0)
MCV: 94.9 fL (ref 80.0–100.0)
Monocytes Absolute: 0.5 10*3/uL (ref 0.1–1.0)
Monocytes Relative: 5 %
Neutro Abs: 8.9 10*3/uL — ABNORMAL HIGH (ref 1.7–7.7)
Neutrophils Relative %: 86 %
Platelets: 178 10*3/uL (ref 150–400)
RBC: 4.49 MIL/uL (ref 3.87–5.11)
RDW: 12.8 % (ref 11.5–15.5)
WBC: 10.4 10*3/uL (ref 4.0–10.5)
nRBC: 0 % (ref 0.0–0.2)

## 2022-05-21 LAB — CK: Total CK: 52 U/L (ref 38–234)

## 2022-05-21 LAB — COMPREHENSIVE METABOLIC PANEL
ALT: 14 U/L (ref 0–44)
AST: 25 U/L (ref 15–41)
Albumin: 4 g/dL (ref 3.5–5.0)
Alkaline Phosphatase: 81 U/L (ref 38–126)
Anion gap: 9 (ref 5–15)
BUN: 21 mg/dL (ref 8–23)
CO2: 28 mmol/L (ref 22–32)
Calcium: 9.1 mg/dL (ref 8.9–10.3)
Chloride: 104 mmol/L (ref 98–111)
Creatinine, Ser: 1.14 mg/dL — ABNORMAL HIGH (ref 0.44–1.00)
GFR, Estimated: 46 mL/min — ABNORMAL LOW (ref >60)
Glucose, Bld: 128 mg/dL — ABNORMAL HIGH (ref 70–99)
Potassium: 4.3 mmol/L (ref 3.5–5.1)
Sodium: 141 mmol/L (ref 135–145)
Total Bilirubin: 0.5 mg/dL (ref 0.3–1.2)
Total Protein: 6.6 g/dL (ref 6.5–8.1)

## 2022-05-21 LAB — CBG MONITORING, ED: Glucose-Capillary: 128 mg/dL — ABNORMAL HIGH (ref 70–99)

## 2022-05-21 MED ORDER — LEVOTHYROXINE SODIUM 75 MCG PO TABS
150.0000 ug | ORAL_TABLET | Freq: Every day | ORAL | Status: DC
  Administered 2022-05-22 – 2022-05-25 (×4): 150 ug via ORAL
  Filled 2022-05-21 (×5): qty 2

## 2022-05-21 MED ORDER — ASPIRIN 81 MG PO CHEW
81.0000 mg | CHEWABLE_TABLET | Freq: Every day | ORAL | Status: DC
  Administered 2022-05-22 – 2022-05-24 (×3): 81 mg via ORAL
  Filled 2022-05-21 (×3): qty 1

## 2022-05-21 MED ORDER — FENTANYL CITRATE PF 50 MCG/ML IJ SOSY
12.5000 ug | PREFILLED_SYRINGE | Freq: Once | INTRAMUSCULAR | Status: AC
  Administered 2022-05-21: 12.5 ug via INTRAVENOUS
  Filled 2022-05-21: qty 1

## 2022-05-21 MED ORDER — ENOXAPARIN SODIUM 40 MG/0.4ML IJ SOSY
40.0000 mg | PREFILLED_SYRINGE | INTRAMUSCULAR | Status: DC
  Administered 2022-05-21 – 2022-05-24 (×4): 40 mg via SUBCUTANEOUS
  Filled 2022-05-21 (×4): qty 0.4

## 2022-05-21 MED ORDER — HYDRALAZINE HCL 20 MG/ML IJ SOLN: 10.0000 mg | Freq: Four times a day (QID) | INTRAMUSCULAR | Status: DC | PRN

## 2022-05-21 MED ORDER — IPRATROPIUM-ALBUTEROL 0.5-2.5 (3) MG/3ML IN SOLN: 3.0000 mL | Freq: Four times a day (QID) | RESPIRATORY_TRACT | Status: DC | PRN

## 2022-05-21 MED ORDER — MORPHINE SULFATE (PF) 2 MG/ML IV SOLN
1.0000 mg | INTRAVENOUS | Status: DC | PRN
  Administered 2022-05-21 (×2): 1 mg via INTRAVENOUS
  Filled 2022-05-21 (×2): qty 1

## 2022-05-21 MED ORDER — DOCUSATE SODIUM 100 MG PO CAPS
100.0000 mg | ORAL_CAPSULE | Freq: Two times a day (BID) | ORAL | Status: DC
  Administered 2022-05-22 – 2022-05-25 (×6): 100 mg via ORAL
  Filled 2022-05-21 (×7): qty 1

## 2022-05-21 MED ORDER — AMLODIPINE BESYLATE 5 MG PO TABS
5.0000 mg | ORAL_TABLET | Freq: Every day | ORAL | Status: DC
  Administered 2022-05-22 – 2022-05-25 (×4): 5 mg via ORAL
  Filled 2022-05-21 (×4): qty 1

## 2022-05-21 MED ORDER — POLYETHYLENE GLYCOL 3350 17 G PO PACK: 17.0000 g | PACK | Freq: Every day | ORAL | Status: DC | PRN

## 2022-05-21 MED ORDER — IPRATROPIUM-ALBUTEROL 20-100 MCG/ACT IN AERS: 1.0000 | INHALATION_SPRAY | Freq: Four times a day (QID) | RESPIRATORY_TRACT | Status: DC | PRN

## 2022-05-21 MED ORDER — TRAMADOL HCL 50 MG PO TABS
50.0000 mg | ORAL_TABLET | Freq: Every day | ORAL | Status: DC
  Administered 2022-05-22 – 2022-05-25 (×4): 50 mg via ORAL
  Filled 2022-05-21 (×4): qty 1

## 2022-05-21 MED ORDER — HYDROCORTISONE (PERIANAL) 2.5 % EX CREA
1.0000 | TOPICAL_CREAM | Freq: Two times a day (BID) | CUTANEOUS | Status: DC
  Administered 2022-05-23 – 2022-05-24 (×3): 1 via RECTAL
  Filled 2022-05-21: qty 28.35

## 2022-05-21 MED ORDER — MAGNESIUM OXIDE -MG SUPPLEMENT 400 (240 MG) MG PO TABS
800.0000 mg | ORAL_TABLET | Freq: Every day | ORAL | Status: DC
  Administered 2022-05-21 – 2022-05-24 (×4): 800 mg via ORAL
  Filled 2022-05-21 (×5): qty 2

## 2022-05-21 MED ORDER — ACETAMINOPHEN 650 MG RE SUPP: 650.0000 mg | Freq: Four times a day (QID) | RECTAL | Status: DC | PRN

## 2022-05-21 MED ORDER — SACCHAROMYCES BOULARDII 250 MG PO CAPS
250.0000 mg | ORAL_CAPSULE | Freq: Every day | ORAL | Status: DC
  Administered 2022-05-23 – 2022-05-25 (×3): 250 mg via ORAL
  Filled 2022-05-21 (×3): qty 1

## 2022-05-21 MED ORDER — MIRTAZAPINE 15 MG PO TABS
15.0000 mg | ORAL_TABLET | Freq: Every day | ORAL | Status: DC
  Administered 2022-05-21 – 2022-05-24 (×4): 15 mg via ORAL
  Filled 2022-05-21 (×4): qty 1

## 2022-05-21 MED ORDER — ONDANSETRON HCL 4 MG PO TABS: 4.0000 mg | ORAL_TABLET | Freq: Four times a day (QID) | ORAL | Status: DC | PRN

## 2022-05-21 MED ORDER — ONDANSETRON HCL 4 MG/2ML IJ SOLN: 4.0000 mg | Freq: Four times a day (QID) | INTRAMUSCULAR | Status: DC | PRN

## 2022-05-21 MED ORDER — ACETAMINOPHEN 325 MG PO TABS: 650.0000 mg | ORAL_TABLET | Freq: Four times a day (QID) | ORAL | Status: DC | PRN

## 2022-05-21 MED ORDER — NAPHAZOLINE-GLYCERIN 0.012-0.25 % OP SOLN
2.0000 [drp] | Freq: Four times a day (QID) | OPHTHALMIC | Status: DC | PRN
  Filled 2022-05-21: qty 15

## 2022-05-21 MED ORDER — DONEPEZIL HCL 10 MG PO TABS
5.0000 mg | ORAL_TABLET | Freq: Every day | ORAL | Status: DC
  Administered 2022-05-21 – 2022-05-24 (×4): 5 mg via ORAL
  Filled 2022-05-21 (×4): qty 1

## 2022-05-21 NOTE — Consult Note (Signed)
Reason for Consult: Left hip pain Referring Physician: Dr. [name] is an 89 y.o. female.  HPI: Becky Mccall is an ambulatory 89 year old female who lives alone but has significant family help who looks in on her about 12 to 16 hours/day.  She had a mechanical fall earlier and describes left hip pain.  Unwitnessed fall.  Seen on camera by family members who came within 10 minutes and had her brought to the emergency department.  Denies any other orthopedic complaints.  Has history of aortic valve replacement but does not take anticoagulants.  Past Medical History:  Diagnosis Date   Brain aneurysm    CAD in native artery    a.  R/LHC 11/15/16 showing normal LVEDP, mild nonobstructive CAD (30% prox RCA, 40% ostial RPDA, 45% D1, 45% dLAD), severe AS with mean gradient 40.1mmHg, AVA 0.76cm2.    Carotid stenosis    a. 1-39% by duplex 2018.   Cervical spondylolysis    Chronic lower back pain    CKD (chronic kidney disease), stage II    COPD (chronic obstructive pulmonary disease) (HCC)    "mild" (12/09/2016)   DDD (degenerative disc disease), lumbar    GERD (gastroesophageal reflux disease)    Hard of hearing    wears hearing aides both ears   Hyperlipidemia    can't take the meds so takes Fish Oil   Hypertension    Hypothyroidism    IBS (irritable bowel syndrome)    Intermittent dysphagia    for pills and solids   Osteopenia    S/P TAVR (transcatheter aortic valve replacement) 12/07/2016   26 mm Edwards Sapien 3 transcatheter heart valve placed via open left transcarotid approach    Severe aortic stenosis    a. s/p TAVR 11/2016.   Vertigo     Past Surgical History:  Procedure Laterality Date   ABDOMINAL HYSTERECTOMY     APPENDECTOMY     BACK SURGERY  2009   "on her lower back; not sure what they did" (12/09/2016)   BLEPHAROPLASTY Bilateral    CARDIAC CATHETERIZATION     CARDIAC VALVE REPLACEMENT  12/07/2016   Edwards Sapien 3 THV (size 26 mm, model # 9600TFX,  serial # [id number]   CATARACT EXTRACTION W/ INTRAOCULAR LENS  IMPLANT, BILATERAL  1999,2000   CATARACT EXTRACTION, BILATERAL Bilateral    COLONOSCOPY W/ BIOPSIES AND POLYPECTOMY  11/2003   Archie Endo 07/08/2010   EYE SURGERY     HEMORROIDECTOMY     LUMBAR LAMINECTOMY  1999   LUMBAR LAMINECTOMY/DECOMPRESSION MICRODISCECTOMY  09/1999   Archie Endo 07/08/2010    POSTERIOR FUSION LUMBAR SPINE  2014   RIGHT/LEFT HEART CATH AND CORONARY ANGIOGRAPHY N/A 11/15/2016   Procedure: RIGHT/LEFT HEART CATH AND CORONARY ANGIOGRAPHY;  Surgeon: [name], MD;  Location: [facility];  Service: Cardiovascular;  Laterality: N/A;   SKIN GRAFT SPLIT THICKNESS LEG / FOOT Left 09/2000   Archie Endo 07/08/2010   TEE WITHOUT CARDIOVERSION N/A 12/07/2016   Procedure: TRANSESOPHAGEAL ECHOCARDIOGRAM (TEE);  Surgeon: [name], MD;  Location: [facility];  Service: Open Heart Surgery;  Laterality: N/A;   TONSILLECTOMY     TOTAL KNEE ARTHROPLASTY Left 04/11/2018   TOTAL KNEE ARTHROPLASTY Left 04/11/2018   Procedure: LEFT TOTAL KNEE ARTHROPLASTY;  Surgeon: Mcarthur Rossetti, MD;  Location: [place];  Service: Orthopedics;  Laterality: Left;   TRANSCATHETER AORTIC VALVE REPLACEMENT, TRANSFEMORAL  12/07/2016   Edwards Sapien 3 THV (size 26 mm, model # 9600TFX, serial # [id number]  Family History  Problem Relation Age of Onset   Cancer - Lung Mother    Heart attack Father    Cancer - Lung Brother    Cancer - Lung Maternal Aunt     Social History:  reports that she quit smoking about 32 years ago. Her smoking use included cigarettes. She has a 80.00 pack-year smoking history. She has never used smokeless tobacco. She reports current alcohol use of about 14.0 standard drinks of alcohol per week. She reports that she does not use drugs.  Allergies:  Allergies  Allergen Reactions   Tape Rash and Other (See Comments)    Developed blisters from clear tape used over cath site 10/2016   Bacitra-Neomycin-Polymyxin-Hc Rash    Bacitracin-Polymyxin B Rash   Lisinopril Cough   5-Alpha Reductase Inhibitors Other (See Comments)    Muscle pain   Ezetimibe Other (See Comments)    Muscle pain   Statins Other (See Comments)    Muscle pain   Welchol [Colesevelam] Other (See Comments)    Muscle pain   Nabumetone Other (See Comments)    CHEST PAIN   Neosporin [Neomycin-Bacitracin Zn-Polymyx] Other (See Comments)    Cause wound/scratch to worsen   Zinc Oxide Rash    Medications: I have reviewed the patient's current medications.  Results for orders placed or performed during the hospital encounter of 05/21/22 (from the past 48 hour(s))  CBG monitoring, ED     Status: Abnormal   Collection Time: 05/21/22  4:08 PM  Result Value Ref Range   Glucose-Capillary 128 (H) 70 - 99 mg/dL    Comment: Glucose reference range applies only to samples taken after fasting for at least 8 hours.  Comprehensive metabolic panel     Status: Abnormal   Collection Time: 05/21/22  4:10 PM  Result Value Ref Range   Sodium 141 135 - 145 mmol/L   Potassium 4.3 3.5 - 5.1 mmol/L   Chloride 104 98 - 111 mmol/L   CO2 28 22 - 32 mmol/L   Glucose, Bld 128 (H) 70 - 99 mg/dL    Comment: Glucose reference range applies only to samples taken after fasting for at least 8 hours.   BUN 21 8 - 23 mg/dL   Creatinine, Ser 1.14 (H) 0.44 - 1.00 mg/dL   Calcium 9.1 8.9 - 10.3 mg/dL   Total Protein 6.6 6.5 - 8.1 g/dL   Albumin 4.0 3.5 - 5.0 g/dL   AST 25 15 - 41 U/L   ALT 14 0 - 44 U/L   Alkaline Phosphatase 81 38 - 126 U/L   Total Bilirubin 0.5 0.3 - 1.2 mg/dL   GFR, Estimated 46 (L) >60 mL/min    Comment: (NOTE) Calculated using the CKD-EPI Creatinine Equation (2021)    Anion gap 9 5 - 15    Comment: Performed at [facility], [place] [address] [place], [place] [address]  CBC with Differential     Status: Abnormal   Collection Time: 05/21/22  4:10 PM  Result Value Ref Range   WBC 10.4 4.0 - 10.5 K/uL   RBC 4.49 3.87 - 5.11  MIL/uL   Hemoglobin 13.9 12.0 - 15.0 g/dL   HCT 42.6 36.0 - 46.0 %   MCV 94.9 80.0 - 100.0 fL   MCH 31.0 26.0 - 34.0 pg   MCHC 32.6 30.0 - 36.0 g/dL   RDW 12.8 11.5 - 15.5 %   Platelets 178 150 - 400 K/uL   nRBC 0.0 0.0 -  0.2 %   Neutrophils Relative % 86 %   Neutro Abs 8.9 (H) 1.7 - 7.7 K/uL   Lymphocytes Relative 8 %   Lymphs Abs 0.8 0.7 - 4.0 K/uL   Monocytes Relative 5 %   Monocytes Absolute 0.5 0.1 - 1.0 K/uL   Eosinophils Relative 0 %   Eosinophils Absolute 0.0 0.0 - 0.5 K/uL   Basophils Relative 0 %   Basophils Absolute 0.0 0.0 - 0.1 K/uL   Immature Granulocytes 1 %   Abs Immature Granulocytes 0.05 0.00 - 0.07 K/uL    Comment: Performed at [facility], [place] [address] [place], [place] [address]  CK     Status: None   Collection Time: 05/21/22  4:10 PM  Result Value Ref Range   Total CK 52 38 - 234 U/L    Comment: Performed at [facility], [place] [address] [place], [place] [address]    DG Hip Unilat With Pelvis 2-3 Views Left  Result Date: 05/21/2022 CLINICAL DATA:  Fall, left hip pain EXAM: DG HIP (WITH OR WITHOUT PELVIS) 2-3V LEFT COMPARISON:  CT pelvis 07/22/2021 FINDINGS: Acute subcapital fracture of the left femoral neck observed with mild cortical discontinuity but no substantial angulation at this time. There is about 3 mm of displacement. Suspected linear 1 cm fragment along the lateral margin of the fracture plane. No trochanteric or intertrochanteric component identified. Lower lumbar posterolateral rod and pedicle screws noted. IMPRESSION: 1. Acute subcapital fracture of the left femoral neck with about 3 mm of displacement. Electronically Signed   By: Van Clines M.D.   On: 05/21/2022 16:48   DG Femur Min 2 Views Left  Result Date: 05/21/2022 CLINICAL DATA:  Fall, left hip pain EXAM: LEFT FEMUR 2 VIEWS COMPARISON:  Hip radiographs from 05/21/2022 FINDINGS: There is cortical discontinuity in the subcapital portion of  the femoral neck compatible with acute femoral neck fracture. Distal superficial femoral artery and popliteal artery atherosclerotic calcification. Total knee prosthesis noted. No periprosthetic fracture is identified. IMPRESSION: 1. Acute subcapital fracture of the left femoral neck. 2. Total knee prosthesis noted. Electronically Signed   By: Van Clines M.D.   On: 05/21/2022 16:46    Review of Systems  Musculoskeletal:  Positive for arthralgias.  All other systems reviewed and are negative.  Blood pressure (!) 144/63, pulse 82, temperature 98.2 F (36.8 C), temperature source Oral, resp. rate 17, height 5\' 6"  (1.676 m), weight 80 kg, SpO2 95 %. Physical Exam Vitals reviewed.  HENT:     Head: Normocephalic.     Nose: Nose normal.     Mouth/Throat:     Mouth: Mucous membranes are moist.  Eyes:     Pupils: Pupils are equal, round, and reactive to light.  Cardiovascular:     Rate and Rhythm: Normal rate.     Pulses: Normal pulses.  Pulmonary:     Effort: Pulmonary effort is normal.  Abdominal:     General: Abdomen is flat.  Musculoskeletal:     Cervical back: Normal range of motion.  Skin:    Capillary Refill: Capillary refill takes less than 2 seconds.  Neurological:     General: No focal deficit present.     Mental Status: She is alert.  Psychiatric:        Mood and Affect: Mood normal.   Examination of bilateral upper extremities demonstrates good range of motion of bilateral wrist elbows and shoulders with no crepitus.  Right lower extremity demonstrates no  effusion in the knee with good range of motion in the right hip knee and ankle.  Pedal pulses palpable.  Ankle dorsiflexion intact bilaterally.  Total knee incision intact on the left-hand side with no effusion.  Patient does have groin pain with internal/external rotation of the leg. Assessment/Plan: Impression is minimally displaced somewhat valgus impacted left femoral neck fracture.  Discussed with daughter and  patient treatment options which would be percutaneous pinning with a period of delayed weightbearing versus hip replacement with immediate weightbearing.  The latter is a slightly more involved surgery with higher risk but the former may require more surgery in the future if fracture healing does not occur.  Plan to get CT scan for better evaluation of fracture morphology and if medical optimization has been performed then we would plan for surgery sometime tomorrow likely in the early afternoon.  Landry Dyke Rilen Shukla 05/21/2022, 7:52 PM

## 2022-05-21 NOTE — ED Provider Notes (Signed)
Becky Mccall AT [facility] Provider Note   CSN: [id number] Arrival date & time: 05/21/22  1514     History  Chief Complaint  Patient presents with   Lytle Michaels    Becky Mccall is a 89 y.o. female. With past medical history of aortic stenosis s/p TAVR, CKD II, COPD, HLD, HTN, chronic low back pain, CAD, brain aneurysm who presents to the emergency department with fall.   [place] her foot got caught on something causing her to trip and fall.  States that she normally walks with a walker and was using this at the time.  She states that she did strike the left posterior part of her head but is unsure on what.  She states that she is having left-sided hip pain.  She denies having any prodromal headache, dizziness, chest pain, palpitations, shortness of breath prior to or after falling.  No recent illnesses.  Has been eating and drinking appropriately at home.  At bedside states that they saw her on the floor on their home camera and went to get her.  Patient states she fell around noon and they state they looked at the camera around then.   Fall       Home Medications Prior to Admission medications   Medication Sig Start Date End Date Taking? Authorizing Provider  acetaminophen (TYLENOL) 500 MG tablet Take 500 mg by mouth every 6 (six) hours as needed for mild pain.    [provider]  amLODipine (NORVASC) 5 MG tablet Take 5 mg by mouth daily. 07/09/20   [provider]  aspirin 81 MG chewable tablet Chew 1 tablet (81 mg total) by mouth 2 (two) times daily. Patient taking differently: Chew 81 mg by mouth at bedtime. 04/13/18   Mcarthur Rossetti, MD  B Complex Vitamins (B COMPLEX PO) Take 1 tablet by mouth daily.    [provider]  Calcium Carb-Cholecalciferol (CALCIUM 1000 + D PO) Take 1 tablet by mouth daily.     [provider]  Cholecalciferol (VITAMIN D3) 50  MCG (2000 UT) TABS Take 2,000 Units by mouth daily.    [provider]  Cinnamon 500 MG capsule Take 500 mg by mouth daily.    [provider]  Diaper Rash Products (A+D DIAPER RASH) CREA Apply 1 application  topically as needed (to the anus, for irritation).    [provider]  donepezil (ARICEPT) 5 MG tablet Take 5 mg by mouth at bedtime.    [provider]  guaiFENesin-dextromethorphan (ROBITUSSIN DM) 100-10 MG/5ML syrup Take 10 mLs by mouth every 4 (four) hours as needed for cough. 01/26/22   Rai, Vernelle Emerald, MD  hydrocortisone (ANUSOL-HC) 2.5 % rectal cream Place 1 Application rectally 2 (two) times daily. to affected area    [provider]  Ipratropium-Albuterol (COMBIVENT) 20-100 MCG/ACT AERS respimat Inhale 1 puff into the lungs every 6 (six) hours as needed for wheezing. 01/26/22   Rai, Vernelle Emerald, MD  levothyroxine (SYNTHROID) 150 MCG tablet Take 150 mcg by mouth daily before breakfast. 06/17/20   [provider]  magnesium oxide (MAG-OX) 400 MG tablet Take 1,200 mg by mouth at bedtime.    [provider]  meclizine (ANTIVERT) 12.5 MG tablet Take 1 tablet (12.5 mg total) by mouth 3 (three) times daily as needed for dizziness. Patient taking differently: Take 12.5 mg by mouth in the morning. 12/15/17   Feliz  Marguarite Arbour, MD  mirtazapine (REMERON) 15 MG tablet Take 15 mg by mouth at bedtime.    [provider]  Multiple Vitamin (MULTIVITAMIN WITH MINERALS) TABS tablet Take 1 tablet by mouth daily.    [provider]  Omega-3 Fatty Acids (FISH OIL) 1000 MG CAPS Take 2,000 mg by mouth daily.    [provider]  polyethylene glycol (MIRALAX / GLYCOLAX) 17 g packet Take 17 g by mouth daily as needed for mild constipation or moderate constipation. 01/26/22   Rai, Vernelle Emerald, MD  pravastatin (PRAVACHOL) 10 MG tablet Take 10 mg by mouth every Monday. 05/12/20   [provider]  Probiotic Product  (PROBIOTIC PO) Take 1 capsule by mouth daily.    [provider]  senna-docusate (SENOKOT-S) 8.6-50 MG tablet Take 2 tablets by mouth at bedtime. 01/26/22   Rai, Vernelle Emerald, MD  traMADol (ULTRAM) 50 MG tablet Take 1 tablet (50 mg total) by mouth in the morning. 01/26/22   Rai, Ripudeep Raliegh Ip, MD  Turmeric 500 MG CAPS Take 500 mg by mouth daily.    [provider]  VISINE-AC 0.05-0.25 % ophthalmic solution Place 2 drops into both eyes 3 (three) times daily as needed (for redness or irritation).    [provider]  vitamin C (ASCORBIC ACID) 500 MG tablet Take 500 mg by mouth daily.    [provider]  vitamin E 400 UNIT capsule Take 400 Units by mouth daily.    [provider]      Allergies    Tape, Bacitra-neomycin-polymyxin-hc, Bacitracin-polymyxin b, Lisinopril, 5-alpha reductase inhibitors, Ezetimibe, Statins, Welchol [colesevelam], Nabumetone, Neosporin [neomycin-bacitracin zn-polymyx], and Zinc oxide    Review of Systems   Review of Systems  Musculoskeletal:  Positive for arthralgias and gait problem.  All other systems reviewed and are negative.   Physical Exam Updated Vital Signs BP (!) 143/66   Pulse 81   Temp 98.2 F (36.8 C) (Oral)   Resp 16   Ht 5\' 6"  (1.676 m)   Wt 80 kg   LMP  (LMP Unknown)   SpO2 95%   BMI 28.47 kg/m  Physical Exam Vitals and nursing note reviewed.  Constitutional:      General: She is not in acute distress.    Appearance: Normal appearance. She is ill-appearing. She is not toxic-appearing.     Comments: Chronically ill appearing   HENT:     Head: Normocephalic and atraumatic.     Mouth/Throat:     Mouth: Mucous membranes are dry.     Pharynx: Oropharynx is clear.  Eyes:     General: No scleral icterus.    Extraocular Movements: Extraocular movements intact.  Cardiovascular:     Rate and Rhythm: Normal rate and regular rhythm.     Pulses: Normal pulses.  Pulmonary:     Effort: Pulmonary effort is  normal. No respiratory distress.     Breath sounds: Normal breath sounds.  Abdominal:     General: Bowel sounds are normal. There is no distension.     Palpations: Abdomen is soft.     Tenderness: There is no abdominal tenderness.  Musculoskeletal:        General: Tenderness present.     Cervical back: Neck supple. No tenderness.     Right lower leg: No edema.     Left lower leg: No edema.     Comments: TTP left thigh, left leg mildly externally rotated without shortening. DP pulse 2+   Skin:  General: Skin is warm and dry.     Capillary Refill: Capillary refill takes less than 2 seconds.  Neurological:     General: No focal deficit present.     Mental Status: She is alert and oriented to person, place, and time.  Psychiatric:        Mood and Affect: Mood normal.        Behavior: Behavior normal.        Thought Content: Thought content normal.        Judgment: Judgment normal.     ED Results / Procedures / Treatments   Labs (all labs ordered are listed, but only abnormal results are displayed) Labs Reviewed  COMPREHENSIVE METABOLIC PANEL - Abnormal; Notable for the following components:      Result Value   Glucose, Bld 128 (*)    Creatinine, Ser 1.14 (*)    GFR, Estimated 46 (*)    All other components within normal limits  CBC WITH DIFFERENTIAL/PLATELET - Abnormal; Notable for the following components:   Neutro Abs 8.9 (*)    All other components within normal limits  CBG MONITORING, ED - Abnormal; Notable for the following components:   Glucose-Capillary 128 (*)    All other components within normal limits  CK  CBC  CREATININE, SERUM  BASIC METABOLIC PANEL  CBC    EKG EKG Interpretation  Date/Time:  Friday May 21 2022 15:59:11 EDT Ventricular Rate:  82 PR Interval:  228 QRS Duration: 92 QT Interval:  385 QTC Calculation: 450 R Axis:   13 Text Interpretation: Sinus rhythm Prolonged PR interval Anteroseptal infarct, old no significant change since Nov  2023 Confirmed by Sherwood Gambler [phone]) on 05/21/2022 4:02:59 PM  Radiology DG Hip Unilat With Pelvis 2-3 Views Left  Result Date: 05/21/2022 CLINICAL DATA:  Fall, left hip pain EXAM: DG HIP (WITH OR WITHOUT PELVIS) 2-3V LEFT COMPARISON:  CT pelvis 07/22/2021 FINDINGS: Acute subcapital fracture of the left femoral neck observed with mild cortical discontinuity but no substantial angulation at this time. There is about 3 mm of displacement. Suspected linear 1 cm fragment along the lateral margin of the fracture plane. No trochanteric or intertrochanteric component identified. Lower lumbar posterolateral rod and pedicle screws noted. IMPRESSION: 1. Acute subcapital fracture of the left femoral neck with about 3 mm of displacement. Electronically Signed   By: Van Clines M.D.   On: 05/21/2022 16:48   DG Femur Min 2 Views Left  Result Date: 05/21/2022 CLINICAL DATA:  Fall, left hip pain EXAM: LEFT FEMUR 2 VIEWS COMPARISON:  Hip radiographs from 05/21/2022 FINDINGS: There is cortical discontinuity in the subcapital portion of the femoral neck compatible with acute femoral neck fracture. Distal superficial femoral artery and popliteal artery atherosclerotic calcification. Total knee prosthesis noted. No periprosthetic fracture is identified. IMPRESSION: 1. Acute subcapital fracture of the left femoral neck. 2. Total knee prosthesis noted. Electronically Signed   By: Van Clines M.D.   On: 05/21/2022 16:46    Procedures Procedures   Medications Ordered in ED Medications  aspirin chewable tablet 81 mg (has no administration in time range)  traMADol (ULTRAM) tablet 50 mg (has no administration in time range)  amLODipine (NORVASC) tablet 5 mg (has no administration in time range)  donepezil (ARICEPT) tablet 5 mg (has no administration in time range)  mirtazapine (REMERON) tablet 15 mg (has no administration in time range)  levothyroxine (SYNTHROID) tablet 150 mcg (has no administration in  time range)  magnesium oxide (MAG-OX) tablet 1,200  mg (has no administration in time range)  polyethylene glycol (MIRALAX / GLYCOLAX) packet 17 g (has no administration in time range)  Probiotic TBEC (has no administration in time range)  hydrocortisone (ANUSOL-HC) 2.5 % rectal cream 1 Application (has no administration in time range)  Ipratropium-Albuterol (COMBIVENT) respimat 1 puff (has no administration in time range)  naphazoline-glycerin (CLEAR EYES REDNESS) ophth solution 2 drop (has no administration in time range)  enoxaparin (LOVENOX) injection 40 mg (has no administration in time range)  acetaminophen (TYLENOL) tablet 650 mg (has no administration in time range)    Or  acetaminophen (TYLENOL) suppository 650 mg (has no administration in time range)  morphine (PF) 2 MG/ML injection 1 mg (has no administration in time range)  docusate sodium (COLACE) capsule 100 mg (has no administration in time range)  ondansetron (ZOFRAN) tablet 4 mg (has no administration in time range)    Or  ondansetron (ZOFRAN) injection 4 mg (has no administration in time range)  hydrALAZINE (APRESOLINE) injection 10 mg (has no administration in time range)  fentaNYL (SUBLIMAZE) injection 12.5 mcg (12.5 mcg Intravenous Given 05/21/22 1751)    ED Course/ Medical Decision Making/ A&P Clinical Course as of 05/21/22 1802  Fri May 21, 2022  1721 Spoke with Dr. Marlou Sa orthopedic surgery. Requesting medicine admission and will see patient.  [LA]    Clinical Course User Index [LA] Mickie Hillier, PA-C   {    Medical Decision Making Amount and/or Complexity of Data Reviewed Labs: ordered. Radiology: ordered.  Risk Prescription drug management. Decision regarding hospitalization.  Initial Impression and Ddx 89 year old female who presents to the emergency department with fall  Patient PMH that increases complexity of ED encounter:  CKD, COPD, CAD, brain aneurysm, AS, chronic low back pain    Interpretation of Diagnostics I independent reviewed and interpreted the labs as followed: No leukocytosis, anemia, electrolyte dysfunction.  CK is normal.  Her creatinine is very mildly elevated to 1.14 but no AKI.  - I independently visualized the following imaging with scope of interpretation limited to determining acute life threatening conditions related to emergency care: Plain film of the left hip and femur, which revealed acute subcapital fracture of the left femoral neck  Patient Reassessment and Ultimate Disposition/Management 89 year old female who presents to the emergency department for fall.  She is overall well-appearing and hemodynamically stable.  She is alert and oriented.  Does not sound like she had prodromal symptoms or syncope prior to falling.  She did strike her head.  Not anticoagulated.  Complaining of left hip pain.  Physical exam as noted above.  Will obtain CT head and C-spine as well as plain film of the left femur and hip.  Will get basic labs including a CK and EKG.  EKG without malignant arrhythmia.  Labs are unremarkable. Plain film of the left hip does show a left femoral neck fracture.  Giving very small dose IV fentanyl 12.5 mcg to help with pain at this time to keep NPO.  I consulted and spoke with Dr. Marlou Sa, orthopedic surgery who will see the patient and is requesting medicine admit.  Consulted and spoke with Dr. Renaee Munda, hospitalist who agrees to admit the patient.  Will update him when CT head and C-spine have resulted.  Will continue to observe her.  Patient management required discussion with the following services or consulting groups:  Hospitalist Service and Orthopedic Surgery  Complexity of Problems Addressed Acute complicated illness or Injury  Additional Data Reviewed and Analyzed Further  history obtained from: Further history from spouse/family member, Past medical history and medications listed in the EMR, Prior ED visit notes, and Care  Everywhere  Patient Encounter Risk Assessment Use of parenteral controlled substances, Consideration of hospitalization, and Major procedures  Final Clinical Impression(s) / ED Diagnoses Final diagnoses:  Closed displaced fracture of left femoral neck [facility])    Rx / DC Orders ED Discharge Orders     None         Mickie Hillier, PA-C 05/21/22 1802    Sherwood Gambler, MD 05/22/22 2337

## 2022-05-21 NOTE — ED Notes (Signed)
Pt's oxygen sats 87-88% on room air. Pt placed on oxygen 2 liters via nasal canula

## 2022-05-21 NOTE — ED Triage Notes (Signed)
Pt c/o left hip pain. EMS reports pt's feet got tangled and she fell going to the kitchen about an hour PTA. EMS reports pt lives home alone and family saw her on the camera.

## 2022-05-21 NOTE — H&P (Signed)
History and Physical  Becky Mccall [id number] DOB: [birth year] DOA: 05/21/2022  PCP: [name], MD   Chief Complaint: fall, left hip pain   HPI: Becky Mccall is a 89 y.o. female with medical history significant for CAD, prior brain imaging, COPD on room air, CKD stage II, GERD, hyperlipidemia, hypertension admitted to the hospital with left hip closed femur fracture after mechanical fall at home today.  Most of the history is provided by the patient's daughter who is at the bedside, patient is hard of hearing.  Apparently, the patient lives in her own home, with the assistance of multiple caregivers during the day.  She was in her kitchen today, when she caught her right foot on the edge of her eyelid, and fell to the ground.  This was unwitnessed, patient states that she did not lose consciousness, but she did hit her head.  She had immediate pain in the left hip and inability to bear weight.  She was brought to the ER for evaluation.  On review of systems, patient and her daughter deny any chest pain, nausea, vomiting, recent illnesses.  No fevers, no chills.  No changes in her medications.  She has been doing well with no problems of late.  ED Course: In the emergency department, patient was noted to be somewhat hypertensive, but otherwise stable vital signs.  Lab work was obtained, which was relatively unremarkable, with her creatinine slightly up to 1.1.  CT scan of the head was ordered, and is still pending at this time.  X-rays were obtained, and consistent with left subcapital fracture.  ER provider discussed with orthopedic surgery who will consult, and hospitalist was asked to admit the patient.  Review of Systems: Please see HPI for pertinent positives and negatives. A complete 10 system review of systems are otherwise negative.  Past Medical History:  Diagnosis Date   Brain aneurysm    CAD in native artery    a.  R/LHC 11/15/16 showing normal LVEDP, mild  nonobstructive CAD (30% prox RCA, 40% ostial RPDA, 45% D1, 45% dLAD), severe AS with mean gradient 40.60mmHg, AVA 0.76cm2.    Carotid stenosis    a. 1-39% by duplex 2018.   Cervical spondylolysis    Chronic lower back pain    CKD (chronic kidney disease), stage II    COPD (chronic obstructive pulmonary disease) (HCC)    "mild" (12/09/2016)   DDD (degenerative disc disease), lumbar    GERD (gastroesophageal reflux disease)    Hard of hearing    wears hearing aides both ears   Hyperlipidemia    can't take the meds so takes Fish Oil   Hypertension    Hypothyroidism    IBS (irritable bowel syndrome)    Intermittent dysphagia    for pills and solids   Osteopenia    S/P TAVR (transcatheter aortic valve replacement) 12/07/2016   26 mm Edwards Sapien 3 transcatheter heart valve placed via open left transcarotid approach    Severe aortic stenosis    a. s/p TAVR 11/2016.   Vertigo    Past Surgical History:  Procedure Laterality Date   ABDOMINAL HYSTERECTOMY     APPENDECTOMY     BACK SURGERY  2009   "on her lower back; not sure what they did" (12/09/2016)   BLEPHAROPLASTY Bilateral    CARDIAC CATHETERIZATION     CARDIAC VALVE REPLACEMENT  12/07/2016   Edwards Sapien 3 THV (size 26 mm, model # 9600TFX, serial # [id number]   CATARACT EXTRACTION  W/ INTRAOCULAR LENS  IMPLANT, BILATERAL  [id number]   CATARACT EXTRACTION, BILATERAL Bilateral    COLONOSCOPY W/ BIOPSIES AND POLYPECTOMY  11/2003   Archie Endo 07/08/2010   EYE SURGERY     HEMORROIDECTOMY     LUMBAR LAMINECTOMY  1999   LUMBAR LAMINECTOMY/DECOMPRESSION MICRODISCECTOMY  09/1999   Archie Endo 07/08/2010    POSTERIOR FUSION LUMBAR SPINE  2014   RIGHT/LEFT HEART CATH AND CORONARY ANGIOGRAPHY N/A 11/15/2016   Procedure: RIGHT/LEFT HEART CATH AND CORONARY ANGIOGRAPHY;  Surgeon: [name], MD;  Location: [facility];  Service: Cardiovascular;  Laterality: N/A;   SKIN GRAFT SPLIT THICKNESS LEG / FOOT Left 09/2000   Archie Endo 07/08/2010    TEE WITHOUT CARDIOVERSION N/A 12/07/2016   Procedure: TRANSESOPHAGEAL ECHOCARDIOGRAM (TEE);  Surgeon: [name], MD;  Location: [facility];  Service: Open Heart Surgery;  Laterality: N/A;   TONSILLECTOMY     TOTAL KNEE ARTHROPLASTY Left 04/11/2018   TOTAL KNEE ARTHROPLASTY Left 04/11/2018   Procedure: LEFT TOTAL KNEE ARTHROPLASTY;  Surgeon: Mcarthur Rossetti, MD;  Location: [facility];  Service: Orthopedics;  Laterality: Left;   TRANSCATHETER AORTIC VALVE REPLACEMENT, TRANSFEMORAL  12/07/2016   Edwards Sapien 3 THV (size 26 mm, model # 9600TFX, serial # [id number]    Social History:  reports that she quit smoking about 32 years ago. Her smoking use included cigarettes. She has a 80.00 pack-year smoking history. She has never used smokeless tobacco. She reports current alcohol use of about 14.0 standard drinks of alcohol per week. She reports that she does not use drugs.   Allergies  Allergen Reactions   Tape Rash and Other (See Comments)    Developed blisters from clear tape used over cath site 10/2016   Bacitra-Neomycin-Polymyxin-Hc Rash   Bacitracin-Polymyxin B Rash   Lisinopril Cough   5-Alpha Reductase Inhibitors Other (See Comments)    Muscle pain   Ezetimibe Other (See Comments)    Muscle pain   Statins Other (See Comments)    Muscle pain   Welchol [Colesevelam] Other (See Comments)    Muscle pain   Nabumetone Other (See Comments)    CHEST PAIN   Neosporin [Neomycin-Bacitracin Zn-Polymyx] Other (See Comments)    Cause wound/scratch to worsen   Zinc Oxide Rash    Family History  Problem Relation Age of Onset   Cancer - Lung Mother    Heart attack Father    Cancer - Lung Brother    Cancer - Lung Maternal Aunt      Prior to Admission medications   Medication Sig Start Date End Date Taking? Authorizing Provider  acetaminophen (TYLENOL) 500 MG tablet Take 500 mg by mouth every 6 (six) hours as needed for mild pain.    [provider]  amLODipine (NORVASC) 5 MG  tablet Take 5 mg by mouth daily. 07/09/20   [provider]  aspirin 81 MG chewable tablet Chew 1 tablet (81 mg total) by mouth 2 (two) times daily. Patient taking differently: Chew 81 mg by mouth at bedtime. 04/13/18   Mcarthur Rossetti, MD  B Complex Vitamins (B COMPLEX PO) Take 1 tablet by mouth daily.    [provider]  Calcium Carb-Cholecalciferol (CALCIUM 1000 + D PO) Take 1 tablet by mouth daily.     [provider]  Cholecalciferol (VITAMIN D3) 50 MCG (2000 UT) TABS Take 2,000 Units by mouth daily.    [provider]  Cinnamon 500 MG capsule Take 500 mg by mouth daily.  [provider]  Diaper Rash Products (A+D DIAPER RASH) CREA Apply 1 application  topically as needed (to the anus, for irritation).    [provider]  donepezil (ARICEPT) 5 MG tablet Take 5 mg by mouth at bedtime.    [provider]  guaiFENesin-dextromethorphan (ROBITUSSIN DM) 100-10 MG/5ML syrup Take 10 mLs by mouth every 4 (four) hours as needed for cough. 01/26/22   Rai, Vernelle Emerald, MD  hydrocortisone (ANUSOL-HC) 2.5 % rectal cream Place 1 Application rectally 2 (two) times daily. to affected area    [provider]  Ipratropium-Albuterol (COMBIVENT) 20-100 MCG/ACT AERS respimat Inhale 1 puff into the lungs every 6 (six) hours as needed for wheezing. 01/26/22   Rai, Vernelle Emerald, MD  levothyroxine (SYNTHROID) 150 MCG tablet Take 150 mcg by mouth daily before breakfast. 06/17/20   [provider]  magnesium oxide (MAG-OX) 400 MG tablet Take 1,200 mg by mouth at bedtime.    [provider]  meclizine (ANTIVERT) 12.5 MG tablet Take 1 tablet (12.5 mg total) by mouth 3 (three) times daily as needed for dizziness. Patient taking differently: Take 12.5 mg by mouth in the morning. 12/15/17   Charlynne Cousins, MD  mirtazapine (REMERON) 15 MG tablet Take 15 mg by mouth at bedtime.    [provider]  Multiple Vitamin  (MULTIVITAMIN WITH MINERALS) TABS tablet Take 1 tablet by mouth daily.    [provider]  Omega-3 Fatty Acids (FISH OIL) 1000 MG CAPS Take 2,000 mg by mouth daily.    [provider]  polyethylene glycol (MIRALAX / GLYCOLAX) 17 g packet Take 17 g by mouth daily as needed for mild constipation or moderate constipation. 01/26/22   Rai, Vernelle Emerald, MD  pravastatin (PRAVACHOL) 10 MG tablet Take 10 mg by mouth every Monday. 05/12/20   [provider]  Probiotic Product (PROBIOTIC PO) Take 1 capsule by mouth daily.    [provider]  senna-docusate (SENOKOT-S) 8.6-50 MG tablet Take 2 tablets by mouth at bedtime. 01/26/22   Rai, Vernelle Emerald, MD  traMADol (ULTRAM) 50 MG tablet Take 1 tablet (50 mg total) by mouth in the morning. 01/26/22   Rai, Ripudeep Raliegh Ip, MD  Turmeric 500 MG CAPS Take 500 mg by mouth daily.    [provider]  VISINE-AC 0.05-0.25 % ophthalmic solution Place 2 drops into both eyes 3 (three) times daily as needed (for redness or irritation).    [provider]  vitamin C (ASCORBIC ACID) 500 MG tablet Take 500 mg by mouth daily.    [provider]  vitamin E 400 UNIT capsule Take 400 Units by mouth daily.    [provider]    Physical Exam: BP (!) 143/66   Pulse 81   Temp 98.2 F (36.8 C) (Oral)   Resp 16   Ht 5\' 6"  (1.676 m)   Wt 80 kg   LMP  (LMP Unknown)   SpO2 95%   BMI 28.47 kg/m   General: Patient is alert, oriented to person place and time, calm, looks younger than stated age, incredibly hard of hearing.  Looks slightly uncomfortable due to left hip discomfort Eyes: EOMI, clear conjuctivae, white sclerea Neck: supple, no masses, trachea mildline  Cardiovascular: RRR, no murmurs or rubs, no peripheral edema  Respiratory: clear to auscultation bilaterally, no wheezes, no crackles  Abdomen: soft, nontender, nondistended, normal bowel tones heard  Skin: dry, no rashes  Musculoskeletal: no joint effusions,  holding right hip and some  flexion, left leg straight Psychiatric: appropriate affect, normal speech Dr. Tyson Bab[facility]: extraocular muscles intact, clear speech, moving all extremities with intact sensorium          Labs on Admission:  Basic Metabolic Panel: Recent Labs  Lab 05/21/22 1610  NA 141  K 4.3  CL 104  CO2 28  GLUCOSE 128*  BUN 21  CREATININE 1.14*  CALCIUM 9.1   Liver Function Tests: Recent Labs  Lab 05/21/22 1610  AST 25  ALT 14  ALKPHOS 81  BILITOT 0.5  PROT 6.6  ALBUMIN 4.0   No results for input(s): "LIPASE", "AMYLASE" in the last 168 hours. No results for input(s): "AMMONIA" in the last 168 hours. CBC: Recent Labs  Lab 05/21/22 1610  WBC 10.4  NEUTROABS 8.9*  HGB 13.9  HCT 42.6  MCV 94.9  PLT 178   Cardiac Enzymes: Recent Labs  Lab 05/21/22 1610  CKTOTAL 52   BNP (last 3 results) No results for input(s): "BNP" in the last 8760 hours.  ProBNP (last 3 results) No results for input(s): "PROBNP" in the last 8760 hours.  CBG: Recent Labs  Lab 05/21/22 1608  GLUCAP 128*  This very CallRadiological Exams on Admission: DG Hip Unilat With Pelvis 2-3 Views Left  Result Date: 05/21/2022 CLINICAL DATA:  Fall, left hip pain EXAM: DG HIP (WITH OR WITHOUT PELVIS) 2-3V LEFT COMPARISON:  CT pelvis 07/22/2021 FINDINGS: Acute subcapital fracture of the left femoral neck observed with mild cortical discontinuity but no substantial angulation at this time. There is about 3 mm of displacement. Suspected linear 1 cm fragment along the lateral margin of the fracture plane. No trochanteric or intertrochanteric component identified. Lower lumbar posterolateral rod and pedicle screws noted. IMPRESSION: 1. Acute subcapital fracture of the left femoral neck with about 3 mm of displacement. Electronically Signed   By: Van Clines M.D.   On: 05/21/2022 16:48   DG Femur Min 2 Views Left  Result Date: 05/21/2022 CLINICAL DATA:  Fall, left hip pain  EXAM: LEFT FEMUR 2 VIEWS COMPARISON:  Hip radiographs from 05/21/2022 FINDINGS: There is cortical discontinuity in the subcapital portion of the femoral neck compatible with acute femoral neck fracture. Distal superficial femoral artery and popliteal artery atherosclerotic calcification. Total knee prosthesis noted. No periprosthetic fracture is identified. IMPRESSION: 1. Acute subcapital fracture of the left femoral neck. 2. Total knee prosthesis noted. Electronically Signed   By: Van Clines M.D.   On: 05/21/2022 16:46    Assessment/Plan Principal Problem:   Hip fracture requiring operative repair, left, closed, initial encounter ([facility]) - due to mechanical fall, without syncope or lack of consciousness.  - inpatient admission - Pain and nausea control as needed - NPO at midnight in anticipation of surgical repair - orthopedics has been consulted and will consider surgical repair  Chronic home medications has been continued for the following conditions:   CAD (coronary artery disease), native coronary artery   S/P TAVR (transcatheter aortic valve replacement)   COPD (chronic obstructive pulmonary disease) (HCC)   GERD (gastroesophageal reflux disease)   Dementia   DDD (degenerative disc disease), lumbar   Essential hypertension  DVT prophylaxis: Lovenox     Code Status: DNR, discussed with patient's daughter at the bedside this evening in the ER.  Consults called: None  Admission status: The appropriate patient status for this patient is INPATIENT. Inpatient status is judged to be reasonable and necessary in order to provide the required intensity of service to ensure the patient's  safety. The patient's presenting symptoms, physical exam findings, and initial radiographic and laboratory data in the context of their chronic comorbidities is felt to place them at high risk for further clinical deterioration. Furthermore, it is not anticipated that the patient will be medically stable  for discharge from the hospital within 2 midnights of admission.    I certify that at the point of admission it is my clinical judgment that the patient will require inpatient hospital care spanning beyond 2 midnights from the point of admission due to high intensity of service, high risk for further deterioration and high frequency of surveillance required  Time spent: 50 minutes  Aya Geisel Neva Seat MD [facility] Pager [phone]  If 7PM-7AM, please contact night-coverage [email or web address] Password [email or web address]  05/21/2022, 6:01 PM

## 2022-05-22 ENCOUNTER — Inpatient Hospital Stay (HOSPITAL_COMMUNITY): Payer: Medicare Other | Admitting: Certified Registered Nurse Anesthetist

## 2022-05-22 ENCOUNTER — Inpatient Hospital Stay (HOSPITAL_COMMUNITY): Payer: Medicare Other

## 2022-05-22 ENCOUNTER — Encounter (HOSPITAL_COMMUNITY)
Admission: EM | Disposition: A | Discharge status: Skilled Nursing Facility | Payer: Self-pay | Source: Home / Self Care | Attending: Internal Medicine

## 2022-05-22 DIAGNOSIS — I10 Essential (primary) hypertension: Secondary | ICD-10-CM

## 2022-05-22 DIAGNOSIS — S72002A Fracture of unspecified part of neck of left femur, initial encounter for closed fracture: Secondary | ICD-10-CM

## 2022-05-22 DIAGNOSIS — Z87891 Personal history of nicotine dependence: Secondary | ICD-10-CM

## 2022-05-22 DIAGNOSIS — I2510 Atherosclerotic heart disease of native coronary artery without angina pectoris: Secondary | ICD-10-CM | POA: Diagnosis not present

## 2022-05-22 HISTORY — PX: ORIF HIP FRACTURE: SHX2125

## 2022-05-22 LAB — BASIC METABOLIC PANEL
Anion gap: 10 (ref 5–15)
BUN: 19 mg/dL (ref 8–23)
CO2: 25 mmol/L (ref 22–32)
Calcium: 8.8 mg/dL — ABNORMAL LOW (ref 8.9–10.3)
Chloride: 103 mmol/L (ref 98–111)
Creatinine, Ser: 0.9 mg/dL (ref 0.44–1.00)
GFR, Estimated: >60 mL/min (ref >60)
Glucose, Bld: 99 mg/dL (ref 70–99)
Potassium: 4 mmol/L (ref 3.5–5.1)
Sodium: 138 mmol/L (ref 135–145)

## 2022-05-22 LAB — CBC
HCT: 45.8 % (ref 36.0–46.0)
Hemoglobin: 14.8 g/dL (ref 12.0–15.0)
MCH: 30.7 pg (ref 26.0–34.0)
MCHC: 32.3 g/dL (ref 30.0–36.0)
MCV: 95 fL (ref 80.0–100.0)
Platelets: 145 10*3/uL — ABNORMAL LOW (ref 150–400)
RBC: 4.82 MIL/uL (ref 3.87–5.11)
RDW: 12.7 % (ref 11.5–15.5)
WBC: 9.1 10*3/uL (ref 4.0–10.5)
nRBC: 0 % (ref 0.0–0.2)

## 2022-05-22 LAB — SURGICAL PCR SCREEN
MRSA, PCR: NEGATIVE
Staphylococcus aureus: NEGATIVE

## 2022-05-22 SURGERY — OPEN REDUCTION INTERNAL FIXATION HIP
Anesthesia: General | Site: Hip | Laterality: Left

## 2022-05-22 MED ORDER — FENTANYL CITRATE PF 50 MCG/ML IJ SOSY: 25.0000 ug | PREFILLED_SYRINGE | INTRAMUSCULAR | Status: DC | PRN

## 2022-05-22 MED ORDER — OYSTER SHELL CALCIUM/D3 500-5 MG-MCG PO TABS
1.0000 | ORAL_TABLET | Freq: Every day | ORAL | Status: DC
  Administered 2022-05-23 – 2022-05-25 (×3): 1 via ORAL
  Filled 2022-05-22 (×3): qty 1

## 2022-05-22 MED ORDER — UMECLIDINIUM BROMIDE 62.5 MCG/ACT IN AEPB
1.0000 | INHALATION_SPRAY | Freq: Every day | RESPIRATORY_TRACT | Status: DC
  Administered 2022-05-23 – 2022-05-25 (×3): 1 via RESPIRATORY_TRACT
  Filled 2022-05-22: qty 7

## 2022-05-22 MED ORDER — DULOXETINE HCL 30 MG PO CPEP
30.0000 mg | ORAL_CAPSULE | Freq: Every day | ORAL | Status: DC
  Administered 2022-05-22 – 2022-05-25 (×4): 30 mg via ORAL
  Filled 2022-05-22 (×4): qty 1

## 2022-05-22 MED ORDER — TIOTROPIUM BROMIDE MONOHYDRATE 2.5 MCG/ACT IN AERS: 2.0000 | INHALATION_SPRAY | Freq: Every day | RESPIRATORY_TRACT | Status: DC

## 2022-05-22 MED ORDER — FENTANYL CITRATE (PF) 100 MCG/2ML IJ SOLN
INTRAMUSCULAR | Status: AC
  Filled 2022-05-22: qty 2

## 2022-05-22 MED ORDER — DEXAMETHASONE SODIUM PHOSPHATE 4 MG/ML IJ SOLN
INTRAMUSCULAR | Status: DC | PRN
  Administered 2022-05-22: 5 mg via INTRAVENOUS

## 2022-05-22 MED ORDER — CEFAZOLIN SODIUM-DEXTROSE 2-4 GM/100ML-% IV SOLN
2.0000 g | INTRAVENOUS | Status: AC
  Administered 2022-05-22: 2 g via INTRAVENOUS

## 2022-05-22 MED ORDER — LIDOCAINE 2% (20 MG/ML) 5 ML SYRINGE
INTRAMUSCULAR | Status: DC | PRN
  Administered 2022-05-22: 100 mg via INTRAVENOUS

## 2022-05-22 MED ORDER — CLONIDINE HCL (ANALGESIA) 100 MCG/ML EP SOLN
EPIDURAL | Status: DC | PRN
  Administered 2022-05-22: 100 ug

## 2022-05-22 MED ORDER — LACTATED RINGERS IV SOLN: INTRAVENOUS | Status: DC | PRN

## 2022-05-22 MED ORDER — PRAVASTATIN SODIUM 10 MG PO TABS
10.0000 mg | ORAL_TABLET | ORAL | Status: DC
  Administered 2022-05-24: 10 mg via ORAL
  Filled 2022-05-22: qty 1

## 2022-05-22 MED ORDER — METOCLOPRAMIDE HCL 5 MG/ML IJ SOLN: 5.0000 mg | Freq: Three times a day (TID) | INTRAMUSCULAR | Status: DC | PRN

## 2022-05-22 MED ORDER — METHOCARBAMOL 1000 MG/10ML IJ SOLN: 500.0000 mg | Freq: Four times a day (QID) | INTRAVENOUS | Status: DC | PRN

## 2022-05-22 MED ORDER — MENTHOL 3 MG MT LOZG: 1.0000 | LOZENGE | OROMUCOSAL | Status: DC | PRN

## 2022-05-22 MED ORDER — MORPHINE SULFATE (PF) 4 MG/ML IV SOLN
INTRAVENOUS | Status: DC | PRN
  Administered 2022-05-22: 8 mg

## 2022-05-22 MED ORDER — PHENOL 1.4 % MT LIQD: 1.0000 | OROMUCOSAL | Status: DC | PRN

## 2022-05-22 MED ORDER — BUPIVACAINE HCL (PF) 0.25 % IJ SOLN
INTRAMUSCULAR | Status: DC | PRN
  Administered 2022-05-22: 30 mL

## 2022-05-22 MED ORDER — METHOCARBAMOL 500 MG PO TABS
500.0000 mg | ORAL_TABLET | Freq: Four times a day (QID) | ORAL | Status: DC | PRN
  Administered 2022-05-22 – 2022-05-23 (×2): 500 mg via ORAL
  Filled 2022-05-22 (×3): qty 1

## 2022-05-22 MED ORDER — ROCURONIUM BROMIDE 10 MG/ML (PF) SYRINGE
PREFILLED_SYRINGE | INTRAVENOUS | Status: DC | PRN
  Administered 2022-05-22: 40 mg via INTRAVENOUS
  Administered 2022-05-22: 20 mg via INTRAVENOUS

## 2022-05-22 MED ORDER — TRANEXAMIC ACID-NACL 1000-0.7 MG/100ML-% IV SOLN
1000.0000 mg | INTRAVENOUS | Status: AC
  Administered 2022-05-22: 1000 mg via INTRAVENOUS

## 2022-05-22 MED ORDER — ONDANSETRON HCL 4 MG PO TABS: 4.0000 mg | ORAL_TABLET | Freq: Four times a day (QID) | ORAL | Status: DC | PRN

## 2022-05-22 MED ORDER — ONDANSETRON HCL 4 MG/2ML IJ SOLN: 4.0000 mg | Freq: Four times a day (QID) | INTRAMUSCULAR | Status: DC | PRN

## 2022-05-22 MED ORDER — VITAMIN C 500 MG PO TABS
500.0000 mg | ORAL_TABLET | Freq: Every day | ORAL | Status: DC
  Administered 2022-05-22 – 2022-05-25 (×4): 500 mg via ORAL
  Filled 2022-05-22 (×4): qty 1

## 2022-05-22 MED ORDER — PHENYLEPHRINE HCL (PRESSORS) 10 MG/ML IV SOLN
INTRAVENOUS | Status: AC
  Filled 2022-05-22: qty 1

## 2022-05-22 MED ORDER — CHLORHEXIDINE GLUCONATE 4 % EX LIQD: 60.0000 mL | Freq: Once | CUTANEOUS | Status: DC

## 2022-05-22 MED ORDER — ACETAMINOPHEN 325 MG PO TABS: 325.0000 mg | ORAL_TABLET | Freq: Four times a day (QID) | ORAL | Status: DC | PRN

## 2022-05-22 MED ORDER — CEFAZOLIN SODIUM-DEXTROSE 2-4 GM/100ML-% IV SOLN
INTRAVENOUS | Status: AC
  Filled 2022-05-22: qty 100

## 2022-05-22 MED ORDER — PROPOFOL 10 MG/ML IV BOLUS
INTRAVENOUS | Status: DC | PRN
  Administered 2022-05-22: 140 mg via INTRAVENOUS

## 2022-05-22 MED ORDER — TRANEXAMIC ACID-NACL 1000-0.7 MG/100ML-% IV SOLN
INTRAVENOUS | Status: AC
  Filled 2022-05-22: qty 100

## 2022-05-22 MED ORDER — METOCLOPRAMIDE HCL 5 MG PO TABS: 5.0000 mg | ORAL_TABLET | Freq: Three times a day (TID) | ORAL | Status: DC | PRN

## 2022-05-22 MED ORDER — SUGAMMADEX SODIUM 200 MG/2ML IV SOLN
INTRAVENOUS | Status: DC | PRN
  Administered 2022-05-22: 200 mg via INTRAVENOUS

## 2022-05-22 MED ORDER — DOCUSATE SODIUM 100 MG PO CAPS: 100.0000 mg | ORAL_CAPSULE | Freq: Two times a day (BID) | ORAL | Status: DC

## 2022-05-22 MED ORDER — HYDROCODONE-ACETAMINOPHEN 5-325 MG PO TABS
1.0000 | ORAL_TABLET | ORAL | Status: DC | PRN
  Administered 2022-05-23 (×2): 1 via ORAL
  Filled 2022-05-22: qty 2
  Filled 2022-05-22: qty 1

## 2022-05-22 MED ORDER — POVIDONE-IODINE 10 % EX SWAB: 2.0000 | Freq: Once | CUTANEOUS | Status: DC

## 2022-05-22 MED ORDER — 0.9 % SODIUM CHLORIDE (POUR BTL) OPTIME
TOPICAL | Status: DC | PRN
  Administered 2022-05-22: 1000 mL

## 2022-05-22 MED ORDER — FENTANYL CITRATE (PF) 100 MCG/2ML IJ SOLN
INTRAMUSCULAR | Status: DC | PRN
  Administered 2022-05-22 (×2): 50 ug via INTRAVENOUS

## 2022-05-22 MED ORDER — BUPIVACAINE HCL 0.25 % IJ SOLN
INTRAMUSCULAR | Status: AC
  Filled 2022-05-22: qty 1

## 2022-05-22 MED ORDER — HYDROXYZINE HCL 25 MG PO TABS
25.0000 mg | ORAL_TABLET | Freq: Every day | ORAL | Status: DC
  Administered 2022-05-22 – 2022-05-25 (×4): 25 mg via ORAL
  Filled 2022-05-22 (×4): qty 1

## 2022-05-22 MED ORDER — ONDANSETRON HCL 4 MG/2ML IJ SOLN: 4.0000 mg | Freq: Once | INTRAMUSCULAR | Status: DC | PRN

## 2022-05-22 MED ORDER — CEFAZOLIN SODIUM-DEXTROSE 1-4 GM/50ML-% IV SOLN
1.0000 g | Freq: Three times a day (TID) | INTRAVENOUS | Status: AC
  Administered 2022-05-22 – 2022-05-23 (×2): 1 g via INTRAVENOUS
  Filled 2022-05-22 (×2): qty 50

## 2022-05-22 MED ORDER — EPHEDRINE 5 MG/ML INJ
INTRAVENOUS | Status: AC
  Filled 2022-05-22: qty 5

## 2022-05-22 MED ORDER — ACETAMINOPHEN 500 MG PO TABS
500.0000 mg | ORAL_TABLET | Freq: Four times a day (QID) | ORAL | Status: AC
  Administered 2022-05-22 – 2022-05-23 (×3): 500 mg via ORAL
  Filled 2022-05-22 (×3): qty 1

## 2022-05-22 MED ORDER — EPHEDRINE SULFATE-NACL 50-0.9 MG/10ML-% IV SOSY
PREFILLED_SYRINGE | INTRAVENOUS | Status: DC | PRN
  Administered 2022-05-22: 7.5 mg via INTRAVENOUS

## 2022-05-22 MED ORDER — TRANEXAMIC ACID-NACL 1000-0.7 MG/100ML-% IV SOLN
1000.0000 mg | Freq: Once | INTRAVENOUS | Status: AC
  Administered 2022-05-22: 1000 mg via INTRAVENOUS
  Filled 2022-05-22: qty 100

## 2022-05-22 MED ORDER — PHENYLEPHRINE 80 MCG/ML (10ML) SYRINGE FOR IV PUSH (FOR BLOOD PRESSURE SUPPORT)
PREFILLED_SYRINGE | INTRAVENOUS | Status: DC | PRN
  Administered 2022-05-22 (×2): 80 ug via INTRAVENOUS
  Administered 2022-05-22: 160 ug via INTRAVENOUS
  Administered 2022-05-22: 120 ug via INTRAVENOUS

## 2022-05-22 MED ORDER — ONDANSETRON HCL 4 MG/2ML IJ SOLN
INTRAMUSCULAR | Status: DC | PRN
  Administered 2022-05-22: 4 mg via INTRAVENOUS

## 2022-05-22 MED ORDER — MORPHINE SULFATE (PF) 4 MG/ML IV SOLN
INTRAVENOUS | Status: AC
  Filled 2022-05-22: qty 2

## 2022-05-22 MED ORDER — CLONIDINE HCL (ANALGESIA) 100 MCG/ML EP SOLN
150.0000 ug | Freq: Once | EPIDURAL | Status: DC
  Filled 2022-05-22: qty 1.5

## 2022-05-22 MED ORDER — MORPHINE SULFATE (PF) 2 MG/ML IV SOLN: 0.5000 mg | INTRAVENOUS | Status: DC | PRN

## 2022-05-22 SURGICAL SUPPLY — 36 items
BAG COUNTER SPONGE SURGICOUNT (BAG) IMPLANT
BAG SPEC THK2 15X12 ZIP CLS (MISCELLANEOUS)
BAG SPNG CNTER NS LX DISP (BAG)
BAG ZIPLOCK 12X15 (MISCELLANEOUS) IMPLANT
BNDG GAUZE DERMACEA FLUFF 4 (GAUZE/BANDAGES/DRESSINGS) ×1 IMPLANT
BNDG GZE DERMACEA 4 6PLY (GAUZE/BANDAGES/DRESSINGS) ×1
COVER PERINEAL POST (MISCELLANEOUS) ×1 IMPLANT
COVER SURGICAL LIGHT HANDLE (MISCELLANEOUS) ×1 IMPLANT
DRAPE STERI IOBAN 125X83 (DRAPES) ×1 IMPLANT
DRAPE U-SHAPE 47X51 STRL (DRAPES) ×2 IMPLANT
DRESSING AQUACEL AG SP 3.5X4 (GAUZE/BANDAGES/DRESSINGS) ×1 IMPLANT
DRSG AQUACEL AG SP 3.5X4 (GAUZE/BANDAGES/DRESSINGS) ×1
DRSG EMULSION OIL 3X16 NADH (GAUZE/BANDAGES/DRESSINGS) ×1 IMPLANT
DRSG TEGADERM 4X4.75 (GAUZE/BANDAGES/DRESSINGS) IMPLANT
DURAPREP 26ML APPLICATOR (WOUND CARE) ×1 IMPLANT
ELECT REM PT RETURN 15FT ADLT (MISCELLANEOUS) ×1 IMPLANT
GAUZE SPONGE 4X4 12PLY STRL (GAUZE/BANDAGES/DRESSINGS) IMPLANT
GAUZE XEROFORM 1X8 LF (GAUZE/BANDAGES/DRESSINGS) IMPLANT
GLOVE SURG ORTHO 8.0 STRL STRW (GLOVE) ×1 IMPLANT
GOWN STRL REUS W/ TWL LRG LVL3 (GOWN DISPOSABLE) ×1 IMPLANT
GOWN STRL REUS W/TWL LRG LVL3 (GOWN DISPOSABLE) ×1
KIT TURNOVER KIT A (KITS) IMPLANT
NS IRRIG 1000ML POUR BTL (IV SOLUTION) ×1 IMPLANT
PACK GENERAL/GYN (CUSTOM PROCEDURE TRAY) ×1 IMPLANT
PIN THD TIP GUIDE 3.2X12 (PIN) IMPLANT
PROTECTOR NERVE ULNAR (MISCELLANEOUS) ×1 IMPLANT
SCREW CANN 7.0X85MM (Screw) ×1 IMPLANT
SCREW CANN RVRS CUT FLT 85X32X (Screw) IMPLANT
SCREW CANN THRD 6.5X90 NS (Screw) IMPLANT
SUT ETHILON 3 0 FSL (SUTURE) IMPLANT
SUT ETHILON 3 0 PS 1 (SUTURE) ×1 IMPLANT
SUT VIC AB 0 CT1 36 (SUTURE) IMPLANT
SUT VIC AB 2-0 CT1 27 (SUTURE) ×1
SUT VIC AB 2-0 CT1 TAPERPNT 27 (SUTURE) IMPLANT
TOWEL OR 17X26 10 PK STRL BLUE (TOWEL DISPOSABLE) ×1 IMPLANT
TRAY FOLEY MTR SLVR 16FR STAT (SET/KITS/TRAYS/PACK) IMPLANT

## 2022-05-22 NOTE — Hospital Course (Signed)
Taken from H&P.   Becky Mccall is a 89 y.o. female with medical history significant for CAD, prior brain imaging, COPD on room air, CKD stage II, GERD, hyperlipidemia, hypertension admitted to the hospital with left hip closed femur fracture after mechanical fall at home.  Patient lives alone with a a lot of family support and monitoring.  ED Course: In the emergency department, patient was noted to be somewhat hypertensive, but otherwise stable vital signs.  Lab work was obtained, which was relatively unremarkable, with her creatinine slightly up to 1.1.  Multiple imaging which include chest x-ray, left elbow x-ray, left hip, CT head, CT cervical spine and CT left hip were all negative except showing a minimally displaced subcapital impaction fracture of the proximal left femur.  Orthopedic surgery was consulted.  Going to operating room today.  3/30: Blood pressure elevated at 167/58 this morning.  Labs seems stable, creatinine improved to 0.90.  CK was normal.  3/31: Hemodynamically stable.  S/p pinning of left hip.  Patient will be nonweightbearing until her follow-up appointment with orthopedic surgery in 7 to 10 days and at that point they will determine when it will be optimal for patient to return to weightbearing. Orthopedic is recommending Lovenox as DVT prophylaxis. Patient did had a skin tear on the nonoperative calf which was related to positioning from procedure and surgeon is recommending keep covering with Ace wrap and Adaptic dressing. Labs seems stable with mild increase in creatinine to 1.16.  Giving some IV fluid. PT/OT

## 2022-05-22 NOTE — Brief Op Note (Signed)
   05/22/2022  4:05 PM  PATIENT:  Becky Mccall  89 y.o. female  PRE-OPERATIVE DIAGNOSIS:  LEFT HIP FRACTURE  POST-OPERATIVE DIAGNOSIS:  LEFT HIP FRACTURE  PROCEDURE:  Procedure(s): OPEN REDUCTION INTERNAL FIXATION HIP  SURGEON:  Surgeon(s): Marlou Sa, Tonna Corner, MD  ASSISTANT: Annie Main, PA  ANESTHESIA:   General  EBL: 10 ml    Total I/O In: 1000 [I.V.:800; IV Piggyback:200] Out: 350 [Urine:300; Blood:50]  BLOOD ADMINISTERED: none  DRAINS: None  LOCAL MEDICATIONS USED: Marcaine morphine clonidine  SPECIMEN:  No Specimen  COUNTS:  YES  TOURNIQUET:  * No tourniquets in log *  DICTATION: .Other Dictation: Dictation Number done  PLAN OF CARE: Admit to inpatient   PATIENT DISPOSITION:  PACU - hemodynamically stable

## 2022-05-22 NOTE — Assessment & Plan Note (Signed)
No acute concerns no chest pain. -Continue with home aspirin and statin

## 2022-05-22 NOTE — Anesthesia Preprocedure Evaluation (Addendum)
Anesthesia Evaluation  Patient identified by MRN, date of birth, ID band Patient awake    Reviewed: Allergy & Precautions, NPO status , Patient's Chart, lab work & pertinent test results  Airway Mallampati: II       Dental  (+) Edentulous Upper, Edentulous Lower   Pulmonary former smoker   Pulmonary exam normal        Cardiovascular hypertension, Pt. on medications + CAD  Normal cardiovascular exam+ Valvular Problems/Murmurs      Neuro/Psych  negative psych ROS   GI/Hepatic Neg liver ROS,GERD  ,,  Endo/Other  Hypothyroidism    Renal/GU   negative genitourinary   Musculoskeletal  (+) Arthritis , Osteoarthritis,    Abdominal Normal abdominal exam  (+)   Peds  Hematology negative hematology ROS (+)   Anesthesia Other Findings HTN, chronic diastolic CHF (EF [id number] by echo 2019), COPD (mild), HLD, mild nonobstructive CAD (30% prox RCA, 40% ostial RPDA, 45% D1, 45% dLAD), BPPV, Hypothyroid, CKD stage II, chronic dizziness, severe aortic stenosis s/p TAVR 2018 (valve functioning normally by echo 2019).   Reproductive/Obstetrics                             Anesthesia Physical Anesthesia Plan  ASA: III  Anesthesia Plan: General   Post-op Pain Management:  Regional for Post-op pain   Induction: Intravenous  PONV Risk Score and Plan: 3 and Ondansetron  Airway Management Planned: Oral ETT  Additional Equipment: None  Intra-op Plan:   Post-operative Plan: Extubation in OR  Informed Consent: I have reviewed the patients History and Physical, chart, labs and discussed the procedure including the risks, benefits and alternatives for the proposed anesthesia with the patient or authorized representative who has indicated his/her understanding and acceptance.   Patient has DNR.  Discussed DNR with power of attorney and Suspend DNR.   Dental advisory given  Plan Discussed with:  CRNA  Anesthesia Plan Comments: (See PAT note by Karoline Caldwell, PA-C )        Anesthesia Quick Evaluation

## 2022-05-22 NOTE — Progress Notes (Addendum)
Patient doing well this morning CT scan left hip reviewed and the patient has 1 to 2 mm displaced valgus impacted subcapital fracture. Scan is reviewed with the daughter. With history of back fusion I think the displacement in his fracture which is minimal to none would allow for pin fixation in 1 week of nonweightbearing followed by progressive weightbearing.  If that fails or collapses then we could proceed with hip replacement.  Based on the valgus angulation of the fracture I think there is a chance this could heal and it certainly is less surgery for her as opposed to hip replacement in the face of prior back fusion.  All questions answered.  Risk and benefits discussed.  Plan to do that early this afternoon.  It appears that the patient did receive Lovenox last night at 10:00 PM.  This dosing does preclude use of spinal anesthetic.  The timing of her surgery puts her 12 hours out and with the addition of transxemic acid and the relatively small incision utilized for the procedure I think she should be fine to proceed.

## 2022-05-22 NOTE — Anesthesia Postprocedure Evaluation (Signed)
Anesthesia Post Note  Patient: Becky Mccall  Procedure(s) Performed: OPEN REDUCTION INTERNAL FIXATION HIP (Left: Hip)     Patient location during evaluation: PACU Anesthesia Type: General Level of consciousness: responds to stimulation Pain management: pain level controlled Vital Signs Assessment: post-procedure vital signs reviewed and stable Respiratory status: spontaneous breathing Cardiovascular status: stable Postop Assessment: no apparent nausea or vomiting Anesthetic complications: no  No notable events documented.  Last Vitals:  Vitals:   05/22/22 1600 05/22/22 1615  BP: (!) 154/55 137/69  Pulse: 79 80  Resp: 15 15  Temp:  36.4 C  SpO2: 100% 100%    Last Pain:  Vitals:   05/22/22 1600  TempSrc:   PainSc: [name]

## 2022-05-22 NOTE — Plan of Care (Signed)
Plan of care initiated and discussed. 

## 2022-05-22 NOTE — Op Note (Unsigned)
NAME: Becky Mccall, Becky Mccall MEDICAL RECORD NO: [id number] ACCOUNT NO: [id number] DATE OF BIRTH: 08/20/1929 FACILITY: [facility] LOCATION: [id number] PHYSICIAN: Yetta Barre. Marlou Sa, MD  Operative Report   DATE OF PROCEDURE: 05/22/2022  PREOPERATIVE DIAGNOSIS:  Left hip nondisplaced slightly valgus impacted fracture.  POSTOPERATIVE DIAGNOSIS:  Left hip nondisplaced slightly valgus impacted fracture.  PROCEDURE:  Left hip fracture pinning using Biomet stainless steel screws.  SURGEON:  Yetta Barre. Marlou Sa, MD  ASSISTANT:  Annie Main, PA.  INDICATIONS:  The patient is a 89 year old patient with left hip pain following a fall.  She presents now for operative management after explanation of risks and benefits.  DESCRIPTION OF PROCEDURE:  The patient was brought to the operating room where general anesthetic was induced.  Preoperative antibiotics administered.  Timeout was called.  The patient was placed on the Hana bed initially with the non-affected leg in the  scissored position, but that did not give adequate visualization of the left hip and so we switched to lithotomy position.  Peroneal nerve well padded.  After calling timeout, the area was prescrubbed with alcohol and Betadine, allowed to air dry,  prepped with DuraPrep solution and draped in sterile manner.  Ioban used to cover the operative field.  About 2.5 cm incision was made.  Three guide pins then placed in the middle third of the femoral neck spaced out in the AP and lateral planes in the  fashion.  Screws were then placed with very good purchase obtained.  The fracture stability was confirmed in the AP and lateral planes under fluoroscopy.  Next, the incision was thoroughly irrigated and anesthetized using a combination of Marcaine,  morphine and clonidine.  We also under fluoroscopic guidance injected the hip joint itself for postoperative pain relief. This was done with 10 mL of the Marcaine, morphine, clonidine.  Following this, the incision was  closed using 0 Vicryl suture, 2-0  Vicryl suture, and 3-0 nylon.  Impervious dressing placed.  The patient tolerated the procedure well without complications, transferred to the recovery room in stable condition.  Luke's assistance was required for opening, closing, mobilization of  tissue.  His assistance was a medical necessity.     SUJ D: 05/22/2022 4:08:41 pm T: 05/22/2022 11:41:00 pm  JOB: [id number]

## 2022-05-22 NOTE — Progress Notes (Signed)
  Progress Note   Patient: Becky Mccall [id number] DOB: 08-Aug-1929 DOA: 05/21/2022     1 DOS: the patient was seen and examined on 05/22/2022   Brief hospital course: Taken from H&P.   KAELEI KORMAN is a 89 y.o. female with medical history significant for CAD, prior brain imaging, COPD on room air, CKD stage II, GERD, hyperlipidemia, hypertension admitted to the hospital with left hip closed femur fracture after mechanical fall at home.  Patient lives alone with a a lot of family support and monitoring.  ED Course: In the emergency department, patient was noted to be somewhat hypertensive, but otherwise stable vital signs.  Lab work was obtained, which was relatively unremarkable, with her creatinine slightly up to 1.1.  Multiple imaging which include chest x-ray, left elbow x-ray, left hip, CT head, CT cervical spine and CT left hip were all negative except showing a minimally displaced subcapital impaction fracture of the proximal left femur.  Orthopedic surgery was consulted.  Going to operating room today.  3/30: Blood pressure elevated at 167/58 this morning.  Labs seems stable, creatinine improved to 0.90.  CK was normal.         Assessment and Plan: * Hip fracture requiring operative repair, left, closed, initial encounter ([place]) Secondary to mechanical fall.  Patient is mobile and independent at baseline. Orthopedic was consulted and patient will be going to the OR for nailing, if that does not work out in next few weeks then she might need total hip replacement. -Follow-up for surgical recommendations -Continue with pain management and supportive care  CAD (coronary artery disease), native coronary artery No acute concerns no chest pain. -Continue with home aspirin and statin  Essential hypertension -Continue home amlodipine  COPD (chronic obstructive pulmonary disease) ([place]) No acute concern. -Continue home inhalers   Subjective: Patient was seen and  examined today.  Denies any pain.  Daughter at bedside.  Physical Exam: Vitals:   05/22/22 0138 05/22/22 0505 05/22/22 1102 05/22/22 1245  BP: 139/60 (!) 167/58 (!) 165/52 (!) 157/64  Pulse: 79 70 69 72  Resp: 18 16 17 14   Temp: 97.7 F (36.5 C) 98.1 F (36.7 C) 98.9 F (37.2 C) 98.9 F (37.2 C)  TempSrc: Oral Oral    SpO2: 95% 98% 95% (!) 87%  Weight:      Height:       General.  Frail, hard of hearing elderly lady, in no acute distress. Pulmonary.  Lungs clear bilaterally, normal respiratory effort. CV.  Regular rate and rhythm, no JVD, rub or murmur. Abdomen.  Soft, nontender, nondistended, BS positive. CNS.  Alert and oriented .  No focal neurologic deficit. Extremities.  No edema, no cyanosis, pulses intact and symmetrical. Psychiatry.  Appears to have some cognitive impairment  Data Reviewed: Prior data reviewed  Family Communication: Discussed with daughter at bedside  Disposition: Status is: Inpatient Remains inpatient appropriate because: Severity of illness  Planned Discharge Destination:  To be determined  DVT prophylaxis.  Lovenox Time spent: 40 minutes  This record has been created using Systems analyst. Errors have been sought and corrected,but may not always be located. Such creation errors do not reflect on the standard of care.   Author: Lorella Nimrod, MD 05/22/2022 1:04 PM  For on call review [email or web address].

## 2022-05-22 NOTE — Assessment & Plan Note (Signed)
No acute concern. -Continue home inhalers

## 2022-05-22 NOTE — Assessment & Plan Note (Signed)
Secondary to mechanical fall.  Patient is mobile and independent at baseline. Orthopedic was consulted and patient will be going to the OR for nailing, if that does not work out in next few weeks then she might need total hip replacement. -Follow-up for surgical recommendations -Continue with pain management and supportive care

## 2022-05-22 NOTE — Anesthesia Procedure Notes (Signed)
Procedure Name: Intubation Date/Time: 05/22/2022 2:00 PM  Performed by: Claudia Desanctis, CRNAPre-anesthesia Checklist: Patient identified, Emergency Drugs available, Suction available and Patient being monitored Patient Re-evaluated:Patient Re-evaluated prior to induction Oxygen Delivery Method: Circle system utilized Preoxygenation: Pre-oxygenation with 100% oxygen Induction Type: IV induction Ventilation: Mask ventilation without difficulty Laryngoscope Size: 2 and Miller Grade View: Grade I Tube type: Oral Tube size: 7.0 mm Number of attempts: 1 Airway Equipment and Method: Stylet Placement Confirmation: ETT inserted through vocal cords under direct vision, positive ETCO2 and breath sounds checked- equal and bilateral Secured at: 21 cm Tube secured with: Tape Dental Injury: Teeth and Oropharynx as per pre-operative assessment

## 2022-05-22 NOTE — Transfer of Care (Signed)
Immediate Anesthesia Transfer of Care Note  Patient: Becky Mccall  Procedure(s) Performed: OPEN REDUCTION INTERNAL FIXATION HIP (Left: Hip)  Patient Location: PACU  Anesthesia Type:General  Level of Consciousness: awake and patient cooperative  Airway & Oxygen Therapy: Patient Spontanous Breathing and Patient connected to face mask  Post-op Assessment: Report given to RN and Post -op Vital signs reviewed and stable  Post vital signs: Reviewed and stable  Last Vitals:  Vitals Value Taken Time  BP 154/60 05/22/22 1546  Temp    Pulse 76 05/22/22 1548  Resp 15 05/22/22 1548  SpO2 100 % 05/22/22 1548  Vitals shown include unvalidated device data.  Last Pain:  Vitals:   05/22/22 0730  TempSrc:   PainSc: Asleep      Patients Stated Pain Goal: 3 ([id number])  Complications: No notable events documented.

## 2022-05-22 NOTE — Assessment & Plan Note (Signed)
-  Continue home amlodipine 

## 2022-05-23 DIAGNOSIS — S72002A Fracture of unspecified part of neck of left femur, initial encounter for closed fracture: Secondary | ICD-10-CM | POA: Diagnosis not present

## 2022-05-23 DIAGNOSIS — I10 Essential (primary) hypertension: Secondary | ICD-10-CM | POA: Diagnosis not present

## 2022-05-23 LAB — CBC
HCT: 42.6 % (ref 36.0–46.0)
Hemoglobin: 13.6 g/dL (ref 12.0–15.0)
MCH: 30.6 pg (ref 26.0–34.0)
MCHC: 31.9 g/dL (ref 30.0–36.0)
MCV: 95.9 fL (ref 80.0–100.0)
Platelets: 153 10*3/uL (ref 150–400)
RBC: 4.44 MIL/uL (ref 3.87–5.11)
RDW: 12.6 % (ref 11.5–15.5)
WBC: 8.4 10*3/uL (ref 4.0–10.5)
nRBC: 0 % (ref 0.0–0.2)

## 2022-05-23 LAB — BASIC METABOLIC PANEL
Anion gap: 10 (ref 5–15)
BUN: 22 mg/dL (ref 8–23)
CO2: 27 mmol/L (ref 22–32)
Calcium: 8.9 mg/dL (ref 8.9–10.3)
Chloride: 101 mmol/L (ref 98–111)
Creatinine, Ser: 1.16 mg/dL — ABNORMAL HIGH (ref 0.44–1.00)
GFR, Estimated: 45 mL/min — ABNORMAL LOW (ref >60)
Glucose, Bld: 121 mg/dL — ABNORMAL HIGH (ref 70–99)
Potassium: 4.5 mmol/L (ref 3.5–5.1)
Sodium: 138 mmol/L (ref 135–145)

## 2022-05-23 MED ORDER — LACTATED RINGERS IV SOLN: INTRAVENOUS | Status: DC

## 2022-05-23 NOTE — Assessment & Plan Note (Signed)
Secondary to mechanical fall.  Patient is mobile and independent at baseline. Orthopedic was consulted and s/p pinning of left hip -Patient will be nonweightbearing until seen in clinic as outpatient by orthopedic surgery. -PT/OT evaluation -Continue with pain management and supportive care

## 2022-05-23 NOTE — Plan of Care (Signed)
  Problem: Coping: Goal: Level of anxiety will decrease Outcome: Progressing   Problem: Pain Managment: Goal: General experience of comfort will improve Outcome: Progressing   Problem: Safety: Goal: Ability to remain free from injury will improve Outcome: Progressing   

## 2022-05-23 NOTE — Evaluation (Signed)
Physical Therapy Evaluation Patient Details Name: Becky Mccall MRN: [id number] DOB: May 05, 1929 Today's Date: 05/23/2022  History of Present Illness  89 y.o. female admitted to the hospital with left hip closed femur fracture after mechanical fall at home.ortho consulted and pt is  s/p L hip pinning on 05/22/22.  PMH: Covid, CAD,  COPD, CKD stage II, GERD, hyperlipidemia, hypertension, vertigo  Clinical Impression  Pt admitted with above diagnosis.  Pt mod I to supervision prior to adm but had near 24 hour care; will need post acute rehab  Pt currently with functional limitations due to the deficits listed below (see PT Problem List). Pt will benefit from acute skilled PT to increase their independence and safety with mobility to allow discharge.          Recommendations for follow up therapy are one component of a multi-disciplinary discharge planning process, led by the attending physician.  Recommendations may be updated based on patient status, additional functional criteria and insurance authorization.  Follow Up Recommendations Can patient physically be transported by private vehicle: No     Assistance Recommended at Discharge Frequent or constant Supervision/Assistance  Patient can return home with the following  Two people to help with walking and/or transfers;A lot of help with bathing/dressing/bathroom;Assist for transportation;Help with stairs or ramp for entrance;Assistance with cooking/housework    Equipment Recommendations None recommended by PT  Recommendations for Other Services       Functional Status Assessment Patient has had a recent decline in their functional status and demonstrates the ability to make significant improvements in function in a reasonable and predictable amount of time.     Precautions / Restrictions Precautions Precautions: Fall Restrictions Weight Bearing Restrictions: Yes LLE Weight Bearing: Non weight bearing      Mobility  Bed  Mobility Overal bed mobility: Needs Assistance Bed Mobility: Supine to Sit     Supine to sit: Mod assist     General bed mobility comments: assist to elevate trunk, bed pad used to assist scooting to EOB in supine and sitting    Transfers Overall transfer level: Needs assistance   Transfers: Bed to chair/wheelchair/BSC, Sit to/from Stand            Lateral/Scoot Transfers: Mod assist General transfer comment: unable to come to stand and maintain NWB; assist to maintain NWB for lateral scoot to recliner, cues to self assist    Ambulation/Gait               General Gait Details: unable  Stairs            Wheelchair Mobility    Modified Rankin (Stroke Patients Only)       Balance Overall balance assessment: Needs assistance Sitting-balance support: No upper extremity supported, Feet supported, Single extremity supported Sitting balance-Leahy Scale: Good       Standing balance-Leahy Scale: Zero                               Pertinent Vitals/Pain Pain Assessment Pain Assessment: Faces Faces Pain Scale: Hurts a little bit Pain Location: L hip Pain Descriptors / Indicators: Grimacing Pain Intervention(s): Limited activity within patient's tolerance, Monitored during session, Premedicated before session, Repositioned    Home Living Family/patient expects to be discharged to:: Skilled nursing facility   Available Help at Discharge: Family;Available PRN/intermittently Type of Home: House Home Access: Stairs to enter       Home Layout: One level Home  Equipment: Conservation officer, nature (2 wheels);BSC/3in1;Shower seat      Prior Function Prior Level of Function : Needs assist             Mobility Comments: pt sleeps in lift chair, doesn't raise lift chair fully up due to fear of falling, using RW in the home or furniture surfing ADLs Comments: patient had assistance for LB dressing from family. daughter in room reported that patient was  able to complete toileting and dressing prior to son in law arrival in the AM normally.     Hand Dominance        Extremity/Trunk Assessment                Communication   Communication: HOH  Cognition Arousal/Alertness: Awake/alert Behavior During Therapy: WFL for tasks assessed/performed Overall Cognitive Status: Within Functional Limits for tasks assessed                                          General Comments      Exercises     Assessment/Plan    PT Assessment Patient needs continued PT services  PT Problem List Decreased strength;Decreased range of motion;Decreased activity tolerance;Decreased mobility;Decreased knowledge of precautions;Decreased balance;Pain       PT Treatment Interventions DME instruction;Gait training;Functional mobility training;Therapeutic activities;Patient/family education;Therapeutic exercise    PT Goals (Current goals can be found in the Care Plan section)  Acute Rehab PT Goals Patient Stated Goal: get better, walk PT Goal Formulation: With patient Time For Goal Achievement: 06/06/22 Potential to Achieve Goals: Good    Frequency Min 3X/week     Co-evaluation               AM-PAC PT "6 Clicks" Mobility  Outcome Measure Help needed turning from your back to your side while in a flat bed without using bedrails?: A Little Help needed moving from lying on your back to sitting on the side of a flat bed without using bedrails?: A Lot Help needed moving to and from a bed to a chair (including a wheelchair)?: Total Help needed standing up from a chair using your arms (e.g., wheelchair or bedside chair)?: Total Help needed to walk in hospital room?: Total Help needed climbing 3-5 steps with a railing? : Total 6 Click Score: 9    End of Session Equipment Utilized During Treatment: Gait belt Activity Tolerance: Patient tolerated treatment well Patient left: in chair;with call bell/phone within reach;with  chair alarm set;with family/visitor present   PT Visit Diagnosis: Other abnormalities of gait and mobility (R26.89);Difficulty in walking, not elsewhere classified (R26.2)    Time: [id number] PT Time Calculation (min) (ACUTE ONLY): 28 min   Charges:   PT Evaluation $PT Eval Low Complexity: 1 Low PT Treatments $Therapeutic Activity: 8-22 mins        Baxter Flattery, PT  Acute Rehab Dept [facility]) [phone]  05/23/2022   [facility] 05/23/2022, 11:16 AM

## 2022-05-23 NOTE — Plan of Care (Signed)
  Problem: Activity: Goal: Risk for activity intolerance will decrease Outcome: Progressing   Problem: Elimination: Goal: Will not experience complications related to bowel motility Outcome: Progressing   Problem: Pain Managment: Goal: General experience of comfort will improve Outcome: Progressing   

## 2022-05-23 NOTE — Progress Notes (Signed)
  Subjective: Patient is a 89 year old female who is POD 1 s/p left hip pinning.  She states that she feels a lot better today in regards to her pain control.  Really does not have any significant hip pain.  She denies any chest pain or shortness of breath.  No other complaints.  Objective: Vital signs in last 24 hours: Temp:  [97.5 F (36.4 C)-98.9 F (37.2 C)] 97.8 F (36.6 C) (03/31 0540) Pulse Rate:  [69-80] 74 (03/31 0540) Resp:  [14-17] 16 (03/31 0540) BP: (111-165)/(47-91) 142/91 (03/31 0540) SpO2:  [87 %-100 %] 97 % (03/31 0540)  Intake/Output from previous day: 03/30 0701 - 03/31 0700 In: 1697 [P.O.:597; I.V.:800; IV Piggyback:300] Out: 1350 [Urine:1300; Blood:50] Intake/Output this shift: No intake/output data recorded.  Exam:  Palpable DP pulse of the operative extremity.  Leg lengths are equal.  Intact ankle dorsiflexion and plantarflexion.  No knee effusion noted.  Dressing is intact without any gross blood or drainage.  No pain with logroll of the left hip.  No calf tenderness.  Negative Homans' sign.  Labs: Recent Labs    05/21/22 1610 05/22/22 0319 05/23/22 0614  HGB 13.9 14.8 13.6   Recent Labs    05/22/22 0319 05/23/22 0614  WBC 9.1 8.4  RBC 4.82 4.44  HCT 45.8 42.6  PLT 145* 153   Recent Labs    05/22/22 0319 05/23/22 0614  NA 138 138  K 4.0 4.5  CL 103 101  CO2 25 27  BUN 19 22  CREATININE 0.90 1.16*  GLUCOSE 99 121*  CALCIUM 8.8* 8.9   No results for input(s): "LABPT", "INR" in the last 72 hours.  Assessment/Plan: Patient is a 89 year old female who is POD 1 s/p left hip pinning.  Plan to continue with nonweightbearing status until her follow-up in clinic in 7 to 10 days.  At that point we will evaluate with radiographs and determine when it is optimal for patient to return to weightbearing which will start with toe-touch weightbearing.  Continue with Lovenox for DVT prophylaxis along with SCDs.  She does have skin tear on the  nonoperative calf that was related to positioning from procedure.  We will keep this covered with Ace wrap and Adaptic dressing.  Follow-up with Dr. Marlou Sa in clinic in 7 to 10 days.   Luke Phi Avans 05/23/2022, 8:11 AM

## 2022-05-23 NOTE — Progress Notes (Signed)
Progress Note   Patient: Becky Mccall [id number] DOB: Sep 22, 1929 DOA: 05/21/2022     2 DOS: the patient was seen and examined on 05/23/2022   Brief hospital course: Taken from H&P.   Becky Mccall is a 89 y.o. female with medical history significant for CAD, prior brain imaging, COPD on room air, CKD stage II, GERD, hyperlipidemia, hypertension admitted to the hospital with left hip closed femur fracture after mechanical fall at home.  Patient lives alone with a a lot of family support and monitoring.  ED Course: In the emergency department, patient was noted to be somewhat hypertensive, but otherwise stable vital signs.  Lab work was obtained, which was relatively unremarkable, with her creatinine slightly up to 1.1.  Multiple imaging which include chest x-ray, left elbow x-ray, left hip, CT head, CT cervical spine and CT left hip were all negative except showing a minimally displaced subcapital impaction fracture of the proximal left femur.  Orthopedic surgery was consulted.  Going to operating room today.  3/30: Blood pressure elevated at 167/58 this morning.  Labs seems stable, creatinine improved to 0.90.  CK was normal.  3/31: Hemodynamically stable.  S/p pinning of left hip.  Patient will be nonweightbearing until her follow-up appointment with orthopedic surgery in 7 to 10 days and at that point they will determine when it will be optimal for patient to return to weightbearing. Orthopedic is recommending Lovenox as DVT prophylaxis. Patient did had a skin tear on the nonoperative calf which was related to positioning from procedure and surgeon is recommending keep covering with Ace wrap and Adaptic dressing. Labs seems stable with mild increase in creatinine to 1.16.  Giving some IV fluid. PT/OT   Assessment and Plan: * Hip fracture requiring operative repair, left, closed, initial encounter ([place]) Secondary to mechanical fall.  Patient is mobile and independent at  baseline. Orthopedic was consulted and s/p pinning of left hip -Patient will be nonweightbearing until seen in clinic as outpatient by orthopedic surgery. -PT/OT evaluation -Continue with pain management and supportive care  CAD (coronary artery disease), native coronary artery No acute concerns no chest pain. -Continue with home aspirin and statin  Essential hypertension -Continue home amlodipine  COPD (chronic obstructive pulmonary disease) ([facility]) No acute concern. -Continue home inhalers   Subjective: Patient was resting comfortably when seen today.  Pain seems well-controlled.  Physical Exam: Vitals:   05/22/22 1858 05/22/22 2103 05/23/22 0206 05/23/22 0540  BP: (!) 122/47 (!) 111/47 136/61 (!) 142/91  Pulse: 77 72 69 74  Resp: 17 16 16 16   Temp: 97.8 F (36.6 C) (!) 97.5 F (36.4 C) 98.5 F (36.9 C) 97.8 F (36.6 C)  TempSrc: Oral Oral Oral Oral  SpO2: 96% 94% 96% 97%  Weight:      Height:       General.  Hard of hearing elderly lady, in no acute distress. Pulmonary.  Lungs clear bilaterally, normal respiratory effort. CV.  Regular rate and rhythm, no JVD, rub or murmur. Abdomen.  Soft, nontender, nondistended, BS positive. CNS.  Alert and oriented .  No focal neurologic deficit. Extremities.  No edema, no cyanosis, pulses intact and symmetrical. Psychiatry.  Judgment and insight appears impaired.   Data Reviewed: Prior data reviewed  Family Communication: Discussed with daughter at bedside  Disposition: Status is: Inpatient Remains inpatient appropriate because: Severity of illness  Planned Discharge Destination:  To be determined  DVT prophylaxis.  Lovenox Time spent: 42 minutes  This record has been  created using Systems analyst. Errors have been sought and corrected,but may not always be located. Such creation errors do not reflect on the standard of care.   Author: Lorella Nimrod, MD 05/23/2022 11:14 AM  For on call review  [email or web address].

## 2022-05-24 ENCOUNTER — Encounter (HOSPITAL_COMMUNITY): Payer: Self-pay | Admitting: Orthopedic Surgery

## 2022-05-24 DIAGNOSIS — S72002A Fracture of unspecified part of neck of left femur, initial encounter for closed fracture: Secondary | ICD-10-CM | POA: Diagnosis not present

## 2022-05-24 DIAGNOSIS — I10 Essential (primary) hypertension: Secondary | ICD-10-CM | POA: Diagnosis not present

## 2022-05-24 NOTE — Progress Notes (Signed)
Progress Note   Patient: Becky Mccall [id number] DOB: Sep 17, 1929 DOA: 05/21/2022     3 DOS: the patient was seen and examined on 05/24/2022   Brief hospital course: Taken from H&P.   Becky Mccall is a 89 y.o. female with medical history significant for CAD, prior brain imaging, COPD on room air, CKD stage II, GERD, hyperlipidemia, hypertension admitted to the hospital with left hip closed femur fracture after mechanical fall at home.  Patient lives alone with a a lot of family support and monitoring.  ED Course: In the emergency department, patient was noted to be somewhat hypertensive, but otherwise stable vital signs.  Lab work was obtained, which was relatively unremarkable, with her creatinine slightly up to 1.1.  Multiple imaging which include chest x-ray, left elbow x-ray, left hip, CT head, CT cervical spine and CT left hip were all negative except showing a minimally displaced subcapital impaction fracture of the proximal left femur.  Orthopedic surgery was consulted.  Going to operating room today.  3/30: Blood pressure elevated at 167/58 this morning.  Labs seems stable, creatinine improved to 0.90.  CK was normal.  3/31: Hemodynamically stable.  S/p pinning of left hip.  Patient will be nonweightbearing until her follow-up appointment with orthopedic surgery in 7 to 10 days and at that point they will determine when it will be optimal for patient to return to weightbearing. Orthopedic is recommending Lovenox as DVT prophylaxis. Patient did had a skin tear on the nonoperative calf which was related to positioning from procedure and surgeon is recommending keep covering with Ace wrap and Adaptic dressing. Labs seems stable with mild increase in creatinine to 1.16.  Giving some IV fluid. PT/OT are recommending SNF.  4/1: Patient remained stable.  Some concern of mild desaturation when placed on room air overnight, currently on 1 L of oxygen.  No baseline oxygen  use.   Assessment and Plan: * Hip fracture requiring operative repair, left, closed, initial encounter Secondary to mechanical fall.  Patient is mobile and independent at baseline. Orthopedic was consulted and s/p pinning of left hip -Patient will be nonweightbearing until seen in clinic as outpatient by orthopedic surgery. -PT/OT evaluation -Continue with pain management and supportive care  CAD (coronary artery disease), native coronary artery No acute concerns no chest pain. -Continue with home aspirin and statin  Essential hypertension -Continue home amlodipine  COPD (chronic obstructive pulmonary disease) No acute concern. -Continue home inhalers   Subjective: Patient was seen and examined today.  No new concerns.  Physical Exam: Vitals:   05/23/22 1410 05/23/22 2027 05/24/22 0540 05/24/22 0825  BP: (!) 122/51 98/82 (!) 176/69   Pulse: 81 80 73   Resp: 14 16 16    Temp: 98.7 F (37.1 C) 98.8 F (37.1 C) 98 F (36.7 C)   TempSrc: Oral Oral Oral   SpO2: 96% 94% 98% (!) 87%  Weight:      Height:       General.  Hard of hearing, frail elderly lady, in no acute distress. Pulmonary.  Lungs clear bilaterally, normal respiratory effort. CV.  Regular rate and rhythm, no JVD, rub or murmur. Abdomen.  Soft, nontender, nondistended, BS positive. CNS.  Alert and oriented .  No focal neurologic deficit. Extremities.  No edema, no cyanosis, pulses intact and symmetrical.  Data Reviewed: Prior data reviewed  Family Communication: Discussed with daughter at bedside  Disposition: Status is: Inpatient Remains inpatient appropriate because: Severity of illness  Planned Discharge Destination:  SNF  DVT prophylaxis.  Lovenox Time spent: 40 minutes  This record has been created using Systems analyst. Errors have been sought and corrected,but may not always be located. Such creation errors do not reflect on the standard of care.   Author: Lorella Nimrod,  MD 05/24/2022 12:16 PM  For on call review [email or web address].

## 2022-05-24 NOTE — TOC Initial Note (Signed)
Transition of Care [facility]) - Initial/Assessment Note    Patient Details  Name: Becky Mccall MRN: [id number] Date of Birth: 09/03/1929  Transition of Care [facility]) CM/SW Contact:    Lennart Pall, LCSW Phone Number: 05/24/2022, 1:45 PM  Clinical Narrative:                  Met with pt and have spoken with daughter, Venia Carbon, today to review dc planning needs.  Both report that they are agreed with recommendation for SNF rehab prior to pt's return home.  Pt does live alone but has support intermittently through the day and occasionally at night but not 24/7.  Will begin SNF bed search and insurance authorization once bed confirmed.   Expected Discharge Plan: Skilled Nursing Facility Barriers to Discharge: SNF Pending bed offer, Insurance Authorization   Patient Goals and CMS Choice Patient states their goals for this hospitalization and ongoing recovery are:: return home following SNF CMS Medicare.gov Compare Post Acute Care list provided to:: Patient Choice offered to / list presented to : Patient, Adult Children      Expected Discharge Plan and Services     Post Acute Care Choice: [place] Living arrangements for the past 2 months: Single Family Home                                      Prior Living Arrangements/Services Living arrangements for the past 2 months: Single Family Home Lives with:: Self Patient language and need for interpreter reviewed:: Yes Do you feel safe going back to the place where you live?: Yes      Need for Family Participation in Patient Care: Yes (Comment) Care giver support system in place?: Yes (comment)   Criminal Activity/Legal Involvement Pertinent to Current Situation/Hospitalization: No - Comment as needed  Activities of Daily Living Home Assistive Devices/Equipment: Cane (specify quad or straight), Eyeglasses, Hand-held shower hose, Shower chair without back ADL Screening (condition at time of  admission) Patient's cognitive ability adequate to safely complete daily activities?: Yes Is the patient deaf or have difficulty hearing?: Yes Does the patient have difficulty seeing, even when wearing glasses/contacts?: No Does the patient have difficulty concentrating, remembering, or making decisions?: No Patient able to express need for assistance with ADLs?: Yes Does the patient have difficulty dressing or bathing?: No Independently performs ADLs?: Yes (appropriate for developmental age) Does the patient have difficulty walking or climbing stairs?: Yes Weakness of Legs: Left Weakness of Arms/Hands: None  Permission Sought/Granted Permission sought to share information with : Family Supports Permission granted to share information with : Yes, Verbal Permission Granted  Share Information with NAME: daughterVenia Carbon @ [phone]           Emotional Assessment Appearance:: Appears stated age Attitude/Demeanor/Rapport: Engaged, Gracious Affect (typically observed): Accepting, Pleasant Orientation: : Oriented to Self, Oriented to Place, Oriented to  Time, Oriented to Situation Alcohol / Substance Use: Not Applicable Psych Involvement: No (comment)  Admission diagnosis:  Other fracture of left femur, initial encounter for closed fracture [S72.8X2A] Closed displaced fracture of left femoral neck [S72.002A] Patient Active Problem List   Diagnosis Date Noted   Hip fracture requiring operative repair, left, closed, initial encounter 05/21/2022   Other fracture of left femur, initial encounter for closed fracture 05/21/2022   COVID-19 virus infection 01/15/2022   Generalized weakness 01/15/2022   COVID-19 01/15/2022   DDD (degenerative disc  disease), lumbar 07/04/2020   OSA (obstructive sleep apnea) 07/04/2020   Status post total left knee replacement 04/11/2018   CAD (coronary artery disease), native coronary artery 01/24/2018   PVC's (premature ventricular contractions)  10/11/2017   Leg cramps 10/11/2017   Essential hypertension 10/10/2017   Chronic pain of right knee 03/17/2017   Unilateral primary osteoarthritis, left knee 03/17/2017   Unilateral primary osteoarthritis, right knee 03/17/2017   Chronic pain of left knee 02/07/2017   S/P TAVR (transcatheter aortic valve replacement) 12/07/2016   Severe aortic stenosis 12/07/2016   Fibrocystic breast disease    IBS (irritable bowel syndrome)    Cervical spondylolysis    Osteopenia    Vitamin D deficiency    Squamous cell skin cancer    Carotid stenosis    Colon polyp    Vertigo 05/04/2013   Hard of hearing    Hyperlipidemia    COPD (chronic obstructive pulmonary disease)    Hypothyroidism    GERD (gastroesophageal reflux disease)    Spinal stenosis in cervical region 02/19/2013   PCP:  [name], MD Pharmacy:   [facility] [place], [place] [address] Phone: [phone] Fax: [place] [address] [place] - [address] AT [place] [address] [place] [address] Phone: [phone] Fax: [phone]     Social Determinants of Health (SDOH) Social History: SDOH Screenings   Food Insecurity: No Food Insecurity (05/21/2022)  Housing: Low Risk  (05/21/2022)  Transportation Needs: No Transportation Needs (05/21/2022)  Utilities: Not At Risk (05/21/2022)  Tobacco Use: Medium Risk (05/21/2022)   SDOH Interventions:     Readmission Risk Interventions    05/24/2022    1:43 PM 01/26/2022   11:41 AM 01/22/2022   10:50 AM  Readmission Risk Prevention Plan  Post Dischage Appt   Complete  Medication Screening   Complete  Transportation Screening Complete Complete Complete  PCP or Specialist Appt within 5-7 Days Complete Complete   Home Care Screening Complete Complete   Medication Review (RN CM) Complete Complete

## 2022-05-24 NOTE — NC FL2 (Signed)
[place] LEVEL OF CARE FORM     IDENTIFICATION  Patient Name: Becky Mccall Birthdate: 04-10-1929 Sex: female Admission Date (Current Location): 05/21/2022  [facility] and [place] Number:  Herbalist and Address:  [facility],  [place], [place]      Provider Number: [id number]  Attending Physician Name and Address:  Lorella Nimrod, MD  Relative Name and Phone Number:  daughterVenia Carbon @ [phone]    Current Level of Care: Hospital Recommended Level of Care: [place] Prior Approval Number:    Date Approved/Denied:   PASRR Number: [id number]  Discharge Plan: SNF    Current Diagnoses: Patient Active Problem List   Diagnosis Date Noted   Hip fracture requiring operative repair, left, closed, initial encounter 05/21/2022   Other fracture of left femur, initial encounter for closed fracture 05/21/2022   COVID-19 virus infection 01/15/2022   Generalized weakness 01/15/2022   COVID-19 01/15/2022   DDD (degenerative disc disease), lumbar 07/04/2020   OSA (obstructive sleep apnea) 07/04/2020   Status post total left knee replacement 04/11/2018   CAD (coronary artery disease), native coronary artery 01/24/2018   PVC's (premature ventricular contractions) 10/11/2017   Leg cramps 10/11/2017   Essential hypertension 10/10/2017   Chronic pain of right knee 03/17/2017   Unilateral primary osteoarthritis, left knee 03/17/2017   Unilateral primary osteoarthritis, right knee 03/17/2017   Chronic pain of left knee 02/07/2017   S/P TAVR (transcatheter aortic valve replacement) 12/07/2016   Severe aortic stenosis 12/07/2016   Fibrocystic breast disease    IBS (irritable bowel syndrome)    Cervical spondylolysis    Osteopenia    Vitamin D deficiency    Squamous cell skin cancer    Carotid stenosis    Colon polyp    Vertigo 05/04/2013   Hard of hearing    Hyperlipidemia    COPD (chronic  obstructive pulmonary disease)    Hypothyroidism    GERD (gastroesophageal reflux disease)    Spinal stenosis in cervical region 02/19/2013    Orientation RESPIRATION BLADDER Height & Weight     Self, Time, Situation, Place  Normal Incontinent, External catheter (currently with purewick) Weight: 176 lb 5.9 oz (80 kg) Height:  5\' 6"  (167.6 cm)  BEHAVIORAL SYMPTOMS/MOOD NEUROLOGICAL BOWEL NUTRITION STATUS      Incontinent Diet (regular)  AMBULATORY STATUS COMMUNICATION OF NEEDS Skin   Extensive Assist Verbally Other (Comment) (surgical incision only)                       Personal Care Assistance Level of Assistance  Bathing, Dressing Bathing Assistance: Limited assistance   Dressing Assistance: Limited assistance     Functional Limitations Info  Sight, Hearing, Speech Sight Info: Adequate Hearing Info: Impaired Speech Info: Adequate    SPECIAL CARE FACTORS FREQUENCY  PT (By licensed PT), OT (By licensed OT)     PT Frequency: 5x/wk OT Frequency: 5x/wk            Contractures Contractures Info: Not present    Additional Factors Info  Code Status, Allergies, Psychotropic Code Status Info: DNR Allergies Info: Tape, Bacitra-neomycin-polymyxin-hc, Bacitracin-polymyxin B, Lisinopril, 5-alpha Reductase Inhibitors, Ezetimibe, Statins, Welchol (Colesevelam), Nabumetone, Neosporin (Neomycin-bacitracin Zn-polymyx), Zinc Oxide Psychotropic Info: see MAR         Current Medications (05/24/2022):  This is the current hospital active medication list Current Facility-Administered Medications  Medication Dose Route Frequency Provider Last Rate Last Admin   acetaminophen (  TYLENOL) tablet 325-650 mg  325-650 mg Oral Q6H PRN Magnant, Charles L, PA-C       amLODipine (NORVASC) tablet 5 mg  5 mg Oral Daily Hollice Gong, Mir M, MD   5 mg at 05/24/22 [id number]   ascorbic acid (VITAMIN C) tablet 500 mg  500 mg Oral Daily Lorella Nimrod, MD   500 mg at 05/24/22 1015   aspirin chewable tablet  81 mg  81 mg Oral QHS Hollice Gong, Mir M, MD   81 mg at 05/23/22 2121   calcium-vitamin D (OSCAL WITH D) 500-5 MG-MCG per tablet 1 tablet  1 tablet Oral QAC breakfast Lorella Nimrod, MD   1 tablet at 05/24/22 0803   cloNIDine (DURACLON) 100 mcg/mL inj- OR Only  150 mcg Intra-articular Once Meredith Pel, MD       docusate sodium (COLACE) capsule 100 mg  100 mg Oral BID Hollice Gong, Mir M, MD   100 mg at 05/24/22 1015   donepezil (ARICEPT) tablet 5 mg  5 mg Oral QHS Hollice Gong, Mir M, MD   5 mg at 05/23/22 2121   DULoxetine (CYMBALTA) DR capsule 30 mg  30 mg Oral Daily Lorella Nimrod, MD   30 mg at 05/24/22 1015   enoxaparin (LOVENOX) injection 40 mg  40 mg Subcutaneous Q24H Hollice Gong, Mir M, MD   40 mg at 05/23/22 2122   hydrALAZINE (APRESOLINE) injection 10 mg  10 mg Intravenous Q6H PRN Lucillie Garfinkel, MD       HYDROcodone-acetaminophen (NORCO/VICODIN) 5-325 MG per tablet 1-2 tablet  1-2 tablet Oral Q4H PRN Magnant, Gerrianne Scale, PA-C   1 tablet at 05/23/22 2121   hydrocortisone (ANUSOL-HC) 2.5 % rectal cream 1 Application  1 Application Rectal BID Lucillie Garfinkel, MD   1 Application at [id number]   hydrOXYzine (ATARAX) tablet 25 mg  25 mg Oral Daily Lorella Nimrod, MD   25 mg at 05/24/22 1015   ipratropium-albuterol (DUONEB) 0.5-2.5 (3) MG/3ML nebulizer solution 3 mL  3 mL Nebulization Q6H PRN Hollice Gong, Mir M, MD       levothyroxine (SYNTHROID) tablet 150 mcg  150 mcg Oral QAC breakfast Hollice Gong, Mir M, MD   150 mcg at 05/24/22 0547   magnesium oxide (MAG-OX) tablet 800 mg  800 mg Oral QHS Hollice Gong, Mir M, MD   800 mg at 05/23/22 2121   menthol-cetylpyridinium (CEPACOL) lozenge 3 mg  1 lozenge Oral PRN Magnant, Charles L, PA-C       Or   phenol (CHLORASEPTIC) mouth spray 1 spray  1 spray Mouth/Throat PRN Magnant, Charles L, PA-C       methocarbamol (ROBAXIN) tablet 500 mg  500 mg Oral Q6H PRN Magnant, Charles L, PA-C   500 mg at 05/23/22 2122   Or   methocarbamol (ROBAXIN) 500 mg in  dextrose 5 % 50 mL IVPB  500 mg Intravenous Q6H PRN Magnant, Charles L, PA-C       metoCLOPramide (REGLAN) tablet 5-10 mg  5-10 mg Oral Q8H PRN Magnant, Charles L, PA-C       Or   metoCLOPramide (REGLAN) injection 5-10 mg  5-10 mg Intravenous Q8H PRN Magnant, Charles L, PA-C       mirtazapine (REMERON) tablet 15 mg  15 mg Oral QHS Hollice Gong, Mir M, MD   15 mg at 05/23/22 2122   morphine (PF) 2 MG/ML injection 0.5 mg  0.5 mg Intravenous Q2H PRN Magnant, Charles L, PA-C       naphazoline-glycerin (CLEAR EYES REDNESS) ophth solution  2 drop  2 drop Both Eyes QID PRN Hollice Gong, Mir M, MD       ondansetron [facility]) tablet 4 mg  4 mg Oral Q6H PRN Hollice Gong, Mir M, MD       Or   ondansetron [facility]) injection 4 mg  4 mg Intravenous Q6H PRN Hollice Gong, Mir M, MD       polyethylene glycol (MIRALAX / GLYCOLAX) packet 17 g  17 g Oral Daily PRN Hollice Gong, Mir M, MD       pravastatin (PRAVACHOL) tablet 10 mg  10 mg Oral Q Isaac Bliss, [place], MD   10 mg at 05/24/22 1015   saccharomyces boulardii (FLORASTOR) capsule 250 mg  250 mg Oral Daily Hollice Gong, Mir M, MD   250 mg at 05/24/22 1015   traMADol (ULTRAM) tablet 50 mg  50 mg Oral Q0600 Hollice Gong, Mir M, MD   50 mg at 05/24/22 0547   umeclidinium bromide (INCRUSE ELLIPTA) 62.5 MCG/ACT 1 puff  1 puff Inhalation Daily Adrian Saran, RPH   1 puff at 05/24/22 [id number]     Discharge Medications: Please see discharge summary for a list of discharge medications.  Relevant Imaging Results:  Relevant Mccall Results:   Additional Information SSN: [id number]  Lennart Pall, LCSW

## 2022-05-24 NOTE — Progress Notes (Signed)
Patient on 1 liter oxygen because oxygen saturations decline below 90, sitting, while on room air. Will continue to monitor and encourage incentive spirometer.

## 2022-05-24 NOTE — Progress Notes (Signed)
Physical Therapy Treatment Patient Details Name: Becky Mccall MRN: [id number] DOB: Oct 05, 1929 Today's Date: 05/24/2022   History of Present Illness 89 y.o. female admitted to the hospital with left hip closed femur fracture after mechanical fall at home.ortho consulted and pt is  s/p L hip pinning on 05/22/22.  PMH: Covid, CAD,  COPD, CKD stage II, GERD, hyperlipidemia, hypertension, vertigo    PT Comments    Pt is cooperative, very HOH but follows basic commands with incr time. Pt requires multi-modal cues and 2 person assist for lateral scooting transfers from bed to drop arm recliner; pt will benefit from continued post acute rehab  Recommendations for follow up therapy are one component of a multi-disciplinary discharge planning process, led by the attending physician.  Recommendations may be updated based on patient status, additional functional criteria and insurance authorization.  Follow Up Recommendations  Can patient physically be transported by private vehicle: No    Assistance Recommended at Discharge Frequent or constant Supervision/Assistance  Patient can return home with the following Two people to help with walking and/or transfers;A lot of help with bathing/dressing/bathroom;Assist for transportation;Help with stairs or ramp for entrance;Assistance with cooking/housework   Equipment Recommendations  None recommended by PT    Recommendations for Other Services       Precautions / Restrictions Precautions Precautions: Fall Restrictions LLE Weight Bearing: Non weight bearing     Mobility  Bed Mobility Overal bed mobility: Needs Assistance Bed Mobility: Supine to Sit     Supine to sit: Mod assist, +2 for physical assistance, +2 for safety/equipment     General bed mobility comments: assist to elevate trunk, bed pad used to assist scooting to EOB in supine and sitting    Transfers Overall transfer level: Needs assistance   Transfers: Bed to  chair/wheelchair/BSC            Lateral/Scoot Transfers: Mod assist, +2 physical assistance, +2 safety/equipment General transfer comment: assist to maintain NWB for lateral scoot to recliner, cues for wt shift anteriorly and  to self assist with bil UEs and RLE, bed pad used to complete lateral scoot    Ambulation/Gait                   Stairs             Wheelchair Mobility    Modified Rankin (Stroke Patients Only)       Balance                                            Cognition Arousal/Alertness: Awake/alert Behavior During Therapy: WFL for tasks assessed/performed, Flat affect   Area of Impairment: Following commands                       Following Commands: Follows one step commands with increased time, Follows multi-step commands inconsistently                Exercises General Exercises - Lower Extremity Ankle Circles/Pumps: AROM, AAROM, Both, 10 reps (requires tactile and verbal cues to complete) Heel Slides: AAROM, Left, 10 reps    General Comments        Pertinent Vitals/Pain Pain Assessment Pain Assessment: Faces Faces Pain Scale: Hurts a little bit Pain Location: L hip Pain Descriptors / Indicators: Sore Pain Intervention(s): Limited activity within patient's tolerance, Monitored during session, Repositioned  Home Living                          Prior Function            PT Goals (current goals can now be found in the care plan section) Acute Rehab PT Goals Patient Stated Goal: get better, walk PT Goal Formulation: With patient Time For Goal Achievement: 06/06/22 Potential to Achieve Goals: Good Progress towards PT goals: Progressing toward goals    Frequency    Min 3X/week      PT Plan Current plan remains appropriate    Co-evaluation              AM-PAC PT "6 Clicks" Mobility   Outcome Measure  Help needed turning from your back to your side while in a flat  bed without using bedrails?: A Lot Help needed moving from lying on your back to sitting on the side of a flat bed without using bedrails?: Total Help needed moving to and from a bed to a chair (including a wheelchair)?: Total Help needed standing up from a chair using your arms (e.g., wheelchair or bedside chair)?: Total Help needed to walk in hospital room?: Total Help needed climbing 3-5 steps with a railing? : Total 6 Click Score: 7    End of Session Equipment Utilized During Treatment: Gait belt Activity Tolerance: Patient tolerated treatment well Patient left: in chair;with call bell/phone within reach;with chair alarm set   PT Visit Diagnosis: Other abnormalities of gait and mobility (R26.89);Difficulty in walking, not elsewhere classified (R26.2)     Time: [id number] PT Time Calculation (min) (ACUTE ONLY): 15 min  Charges:  $Therapeutic Activity: 8-22 mins                     Baxter Flattery, PT  Acute Rehab Dept [facility]) [phone]  05/24/2022    [facility] 05/24/2022, 12:01 PM

## 2022-05-24 NOTE — Progress Notes (Signed)
  Subjective: Becky Mccall is a 89 y.o. female s/p left Hip pinning.  They are POD2.  Pt's pain is controlled.  No other complaints aside from mild pain with mobility.  She is NWB.    Objective: Vital signs in last 24 hours: Temp:  [98 F (36.7 C)-98.8 F (37.1 C)] 98 F (36.7 C) (04/01 0540) Pulse Rate:  [73-81] 73 (04/01 0540) Resp:  [14-16] 16 (04/01 0540) BP: (98-176)/(51-82) 176/69 (04/01 0540) SpO2:  [87 %-98 %] 87 % (04/01 0825)  Intake/Output from previous day: 03/31 0701 - 04/01 0700 In: 360 [P.O.:360] Out: 1300 [Urine:1300] Intake/Output this shift: Total I/O In: 360 [P.O.:360] Out: 50 [Urine:50]  Exam:  No gross blood or drainage overlying the dressing Left foot warm well-perfused Sensation intact distally in the left foot Able to dorsiflex and plantarflex the left foot Minimal pain with hip range of motion.  No calf tenderness of the left leg   Labs: Recent Labs    05/21/22 1610 05/22/22 0319 05/23/22 0614  HGB 13.9 14.8 13.6   Recent Labs    05/22/22 0319 05/23/22 0614  WBC 9.1 8.4  RBC 4.82 4.44  HCT 45.8 42.6  PLT 145* 153   Recent Labs    05/22/22 0319 05/23/22 0614  NA 138 138  K 4.0 4.5  CL 103 101  CO2 25 27  BUN 19 22  CREATININE 0.90 1.16*  GLUCOSE 99 121*  CALCIUM 8.8* 8.9   No results for input(s): "LABPT", "INR" in the last 72 hours.  Assessment/Plan: Pt is POD 2 s/p left hip pinning.    -Disposition pending medical team clearance and decision  -Non weight bearing to LLE  -Mobility with PT   -Follow-up with Dr Marlou Sa 7-10 days postop   [facility] 05/24/2022, 1:38 PM

## 2022-05-25 DIAGNOSIS — M25552 Pain in left hip: Secondary | ICD-10-CM | POA: Diagnosis not present

## 2022-05-25 DIAGNOSIS — E785 Hyperlipidemia, unspecified: Secondary | ICD-10-CM | POA: Diagnosis not present

## 2022-05-25 DIAGNOSIS — S72012D Unspecified intracapsular fracture of left femur, subsequent encounter for closed fracture with routine healing: Secondary | ICD-10-CM | POA: Diagnosis not present

## 2022-05-25 DIAGNOSIS — I2510 Atherosclerotic heart disease of native coronary artery without angina pectoris: Secondary | ICD-10-CM

## 2022-05-25 DIAGNOSIS — Z9181 History of falling: Secondary | ICD-10-CM | POA: Diagnosis not present

## 2022-05-25 DIAGNOSIS — Z7401 Bed confinement status: Secondary | ICD-10-CM | POA: Diagnosis not present

## 2022-05-25 DIAGNOSIS — K5900 Constipation, unspecified: Secondary | ICD-10-CM | POA: Diagnosis not present

## 2022-05-25 DIAGNOSIS — G3184 Mild cognitive impairment, so stated: Secondary | ICD-10-CM | POA: Diagnosis not present

## 2022-05-25 DIAGNOSIS — J449 Chronic obstructive pulmonary disease, unspecified: Secondary | ICD-10-CM | POA: Diagnosis not present

## 2022-05-25 DIAGNOSIS — I129 Hypertensive chronic kidney disease with stage 1 through stage 4 chronic kidney disease, or unspecified chronic kidney disease: Secondary | ICD-10-CM | POA: Diagnosis not present

## 2022-05-25 DIAGNOSIS — R410 Disorientation, unspecified: Secondary | ICD-10-CM | POA: Diagnosis not present

## 2022-05-25 DIAGNOSIS — S72002A Fracture of unspecified part of neck of left femur, initial encounter for closed fracture: Secondary | ICD-10-CM | POA: Diagnosis not present

## 2022-05-25 DIAGNOSIS — F32A Depression, unspecified: Secondary | ICD-10-CM | POA: Diagnosis not present

## 2022-05-25 DIAGNOSIS — Z952 Presence of prosthetic heart valve: Secondary | ICD-10-CM | POA: Diagnosis not present

## 2022-05-25 DIAGNOSIS — M5136 Other intervertebral disc degeneration, lumbar region: Secondary | ICD-10-CM | POA: Diagnosis not present

## 2022-05-25 DIAGNOSIS — S72002D Fracture of unspecified part of neck of left femur, subsequent encounter for closed fracture with routine healing: Secondary | ICD-10-CM | POA: Diagnosis not present

## 2022-05-25 DIAGNOSIS — E039 Hypothyroidism, unspecified: Secondary | ICD-10-CM | POA: Diagnosis not present

## 2022-05-25 DIAGNOSIS — I10 Essential (primary) hypertension: Secondary | ICD-10-CM | POA: Diagnosis not present

## 2022-05-25 MED ORDER — TRAMADOL HCL 50 MG PO TABS: 50.0000 mg | ORAL_TABLET | Freq: Every morning | ORAL | 0 refills | Status: AC

## 2022-05-25 MED ORDER — DOCUSATE SODIUM 100 MG PO CAPS: 100.0000 mg | ORAL_CAPSULE | Freq: Two times a day (BID) | ORAL | 0 refills | Status: AC

## 2022-05-25 NOTE — Plan of Care (Signed)
°  Problem: Clinical Measurements: °Goal: Will remain free from infection °Outcome: Progressing °  °Problem: Activity: °Goal: Risk for activity intolerance will decrease °Outcome: Progressing °  °Problem: Nutrition: °Goal: Adequate nutrition will be maintained °Outcome: Progressing °  °

## 2022-05-25 NOTE — Discharge Summary (Signed)
Physician Discharge Summary   Patient: Becky Mccall MRN: [id number] DOB: 25-Jan-1930  Admit date:     05/21/2022  Discharge date: 05/25/22  Discharge Physician: Lorella Nimrod   PCP: [name], MD   Recommendations at discharge:  Please obtain CBC and BMP in 1 week Follow-up with orthopedic surgery in 1 week Patient will be nonweightbearing on left lower extremity until sees at her orthopedic office and then they can decide Follow-up with primary care provider  Discharge Diagnoses: Principal Problem:   Hip fracture requiring operative repair, left, closed, initial encounter Active Problems:   CAD (coronary artery disease), native coronary artery   S/P TAVR (transcatheter aortic valve replacement)   Essential hypertension   COPD (chronic obstructive pulmonary disease)   GERD (gastroesophageal reflux disease)   DDD (degenerative disc disease), lumbar   Other fracture of left femur, initial encounter for closed fracture   Hospital Course: Taken from H&P.   Becky Mccall is a 89 y.o. female with medical history significant for CAD, prior brain imaging, COPD on room air, CKD stage II, GERD, hyperlipidemia, hypertension admitted to the hospital with left hip closed femur fracture after mechanical fall at home.  Patient lives alone with a a lot of family support and monitoring.  ED Course: In the emergency department, patient was noted to be somewhat hypertensive, but otherwise stable vital signs.  Lab work was obtained, which was relatively unremarkable, with her creatinine slightly up to 1.1.  Multiple imaging which include chest x-ray, left elbow x-ray, left hip, CT head, CT cervical spine and CT left hip were all negative except showing a minimally displaced subcapital impaction fracture of the proximal left femur.  Orthopedic surgery was consulted.  Going to operating room today.  3/30: Blood pressure elevated at 167/58 this morning.  Labs seems stable, creatinine  improved to 0.90.  CK was normal.  3/31: Hemodynamically stable.  S/p pinning of left hip.  Patient will be nonweightbearing until her follow-up appointment with orthopedic surgery in 7 to 10 days and at that point they will determine when it will be optimal for patient to return to weightbearing. Orthopedic is recommending Lovenox as DVT prophylaxis. Patient did had a skin tear on the nonoperative calf which was related to positioning from procedure and surgeon is recommending keep covering with Ace wrap and Adaptic dressing. Labs seems stable with mild increase in creatinine to 1.16.  Giving some IV fluid. PT/OT are recommending SNF.  4/1: Patient remained stable.  Some concern of mild desaturation when placed on room air overnight, currently on 1 L of oxygen.  No baseline oxygen use.  4/2: Remained hemodynamically stable.  Intermittent mild desaturation, most likely secondary to poor respiratory effort.  Patient should encourage incentive spirometry and can use 1 to 2 L of oxygen as needed.  Chest clear on exam and patient appears comfortable.  Patient will remain nonweightbearing on left lower extremity and need to have a follow-up with orthopedic surgery in 1 week for further recommendations.  She will be using baby aspirin twice daily as DVT prophylaxis.  Patient will continue with rest of her home medications and need to have a close follow-up with her providers.  Assessment and Plan: * Hip fracture requiring operative repair, left, closed, initial encounter Secondary to mechanical fall.  Patient is mobile and independent at baseline. Orthopedic was consulted and s/p pinning of left hip -Patient will be nonweightbearing until seen in clinic as outpatient by orthopedic surgery. -PT/OT evaluation -Continue with pain  management and supportive care  CAD (coronary artery disease), native coronary artery No acute concerns no chest pain. -Continue with home aspirin and statin  Essential  hypertension -Continue home amlodipine  COPD (chronic obstructive pulmonary disease) No acute concern. -Continue home inhalers   Pain control - [place] Controlled Substance Reporting System database was reviewed. and patient was instructed, not to drive, operate heavy machinery, perform activities at heights, swimming or participation in water activities or provide baby-sitting services while on Pain, Sleep and Anxiety Medications; until their outpatient Physician has advised to do so again. Also recommended to not to take more than prescribed Pain, Sleep and Anxiety Medications.  Consultants: Orthopedic surgery Procedures performed: Pinning of left hip Disposition: Skilled nursing facility Diet recommendation:  Discharge Diet Orders (From admission, onward)     Start     Ordered   05/25/22 0000  Diet - low sodium heart healthy        05/25/22 1050           Cardiac diet DISCHARGE MEDICATION: Allergies as of 05/25/2022       Reactions   Tape Rash, Other (See Comments)   Developed blisters from clear tape used over cath site 10/2016   Bacitra-neomycin-polymyxin-hc Rash   Bacitracin-polymyxin B Rash   Lisinopril Cough   5-alpha Reductase Inhibitors Other (See Comments)   Muscle pain   Ezetimibe Other (See Comments)   Muscle pain   Statins Other (See Comments)   Muscle pain   Welchol [colesevelam] Other (See Comments)   Muscle pain   Nabumetone Other (See Comments)   CHEST PAIN   Neosporin [neomycin-bacitracin Zn-polymyx] Other (See Comments)   Cause wound/scratch to worsen   Zinc Oxide Rash        Medication List     STOP taking these medications    guaiFENesin-dextromethorphan 100-10 MG/5ML syrup Commonly known as: ROBITUSSIN DM   Ipratropium-Albuterol 20-100 MCG/ACT Aers respimat Commonly known as: COMBIVENT       TAKE these medications    A+D Diaper Rash Crea Apply 1 application  topically as needed (to the anus, for irritation).    acetaminophen 500 MG tablet Commonly known as: TYLENOL Take 500 mg by mouth every 6 (six) hours as needed for mild pain.   amLODipine 5 MG tablet Commonly known as: NORVASC Take 5 mg by mouth daily.   ascorbic acid 500 MG tablet Commonly known as: VITAMIN C Take 500 mg by mouth daily.   aspirin 81 MG chewable tablet Chew 1 tablet (81 mg total) by mouth 2 (two) times daily.   B COMPLEX PO Take 1 tablet by mouth daily.   CALCIUM 1000 + D PO Take 1 tablet by mouth daily.   Cinnamon 500 MG capsule Take 500 mg by mouth daily.   docusate sodium 100 MG capsule Commonly known as: COLACE Take 1 capsule (100 mg total) by mouth 2 (two) times daily.   donepezil 5 MG tablet Commonly known as: ARICEPT Take 5 mg by mouth at bedtime.   DULoxetine 30 MG capsule Commonly known as: CYMBALTA Take 30 mg by mouth daily.   Fish Oil 1000 MG Caps Take 2,000 mg by mouth daily.   hydrOXYzine 25 MG tablet Commonly known as: ATARAX Take 25 mg by mouth daily.   levothyroxine 150 MCG tablet Commonly known as: SYNTHROID Take 150 mcg by mouth daily before breakfast.   magnesium oxide 400 MG tablet Commonly known as: MAG-OX Take 1,200 mg by mouth at bedtime.   meclizine  12.5 MG tablet Commonly known as: ANTIVERT Take 1 tablet (12.5 mg total) by mouth 3 (three) times daily as needed for dizziness. What changed: when to take this   mirtazapine 15 MG tablet Commonly known as: REMERON Take 15 mg by mouth at bedtime.   multivitamin with minerals Tabs tablet Take 1 tablet by mouth daily.   polyethylene glycol 17 g packet Commonly known as: MIRALAX / GLYCOLAX Take 17 g by mouth daily as needed for mild constipation or moderate constipation.   pravastatin 10 MG tablet Commonly known as: PRAVACHOL Take 10 mg by mouth every Monday.   PROBIOTIC PO Take 1 capsule by mouth daily.   Spiriva Respimat 2.5 MCG/ACT Aers Generic drug: Tiotropium Bromide Monohydrate Inhale 2 each into the  lungs daily at 6 (six) AM.   traMADol 50 MG tablet Commonly known as: Ultram Take 1 tablet (50 mg total) by mouth in the morning.   Turmeric 500 MG Caps Take 500 mg by mouth daily.   Visine-AC 0.05-0.25 % ophthalmic solution Generic drug: tetrahydrozoline-zinc Place 2 drops into both eyes 3 (three) times daily as needed (for redness or irritation).   Vitamin D3 50 MCG (2000 UT) Tabs Take 2,000 Units by mouth daily.   vitamin E 180 MG (400 UNITS) capsule Take 400 Units by mouth daily.               Discharge Care Instructions  (From admission, onward)           Start     Ordered   05/25/22 0000  Leave dressing on - Keep it clean, dry, and intact until clinic visit        05/25/22 1050            Contact information for follow-up providers     [name], MD. Schedule an appointment as soon as possible for a visit in 1 week(s).   Specialty: Family Medicine Contact information: [address] [place] [address] [phone]         Meredith Pel, MD. Schedule an appointment as soon as possible for a visit in 1 week(s).   Specialty: Orthopedic Surgery Contact information: [place] [address] [phone]              Contact information for after-discharge care     Destination     [facility] .   Service: Skilled Nursing Contact information: [address] [place] [phone]                    Discharge Exam: Danley Danker Weights   05/21/22 1527  Weight: 80 kg   General.  Frail elderly lady, very hard of hearing, in no acute distress. Pulmonary.  Lungs clear bilaterally, normal respiratory effort. CV.  Regular rate and rhythm, no JVD, rub or murmur. Abdomen.  Soft, nontender, nondistended, BS positive. CNS.  Alert and oriented .  No focal neurologic deficit. Extremities.  No edema, no cyanosis, pulses intact and  symmetrical. Psychiatry.  Appears to have some cognitive impairment  Condition at discharge: Fullstable  The results of significant diagnostics from this hospitalization (including imaging, microbiology, ancillary and laboratory) are listed below for reference.   Imaging Studies: Pelvis Portable  Result Date: 05/22/2022 CLINICAL DATA:  Hip fracture, status post left hip pinning. EXAM: PORTABLE PELVIS 1-2 VIEWS COMPARISON:  Preoperative imaging. FINDINGS: Imaging focused on the lower pelvis and upper femurs in the region of postsurgical  change. Three screws traverse left femoral neck fracture. No periprosthetic lucency. Recent postsurgical change includes air and edema in the soft tissues. No other acute abnormalities. IMPRESSION: Post pinning of left femoral neck fracture. No immediate postoperative complication. Electronically Signed   By: Keith Rake M.D.   On: 05/22/2022 16:53   DG HIP UNILAT WITH PELVIS 1V LEFT  Result Date: 05/22/2022 CLINICAL DATA:  Pinning of the left hip EXAM: DG HIP (WITH OR WITHOUT PELVIS) 1V*L* COMPARISON:  Left hip and pelvis radiograph dated 05/21/2022 FINDINGS: Three fluoroscopic images obtained during left hip pinning. 1 minute 1 second fluoro time utilized. Radiation dose 16.16 mGy Kerma. Please see performing physicians operative report for full details IMPRESSION: Fluoroscopic images were obtained for intraoperative guidance of left hip pinning. Electronically Signed   By: Darrin Nipper M.D.   On: 05/22/2022 15:59   DG C-Arm 1-60 Min-No Report  Result Date: 05/22/2022 Fluoroscopy was utilized by the requesting physician.  No radiographic interpretation.   DG C-Arm 1-60 Min-No Report  Result Date: 05/22/2022 Fluoroscopy was utilized by the requesting physician.  No radiographic interpretation.   CT HIP LEFT WO CONTRAST  Result Date: 05/21/2022 CLINICAL DATA:  Hip trauma, fracture suspected. EXAM: CT OF THE LEFT HIP WITHOUT CONTRAST TECHNIQUE: Multidetector  CT imaging of the left hip was performed according to the standard protocol. Multiplanar CT image reconstructions were also generated. RADIATION DOSE REDUCTION: This exam was performed according to the departmental dose-optimization program which includes automated exposure control, adjustment of the mA and/or kV according to patient size and/or use of iterative reconstruction technique. COMPARISON:  05/21/2022. FINDINGS: Bones/Joint/Cartilage There is a minimally displaced subcapital impaction fracture of the proximal left femur. No dislocation. Mild degenerative changes are noted at the hips bilaterally. A small joint effusion is noted at the left hip. Spinal fusion hardware are noted in the lower lumbar spine. Ligaments Suboptimally assessed by CT. Muscles and Tendons No intramuscular hematoma. Mild edema is present about the left hip. Soft tissues Aortic atherosclerosis. IMPRESSION: Minimally displaced subcapital impaction fracture of the proximal left femur. Electronically Signed   By: Brett Fairy M.D.   On: 05/21/2022 21:00   CT Head Wo Contrast  Result Date: 05/21/2022 CLINICAL DATA:  Head trauma EXAM: CT HEAD WITHOUT CONTRAST CT CERVICAL SPINE WITHOUT CONTRAST TECHNIQUE: Multidetector CT imaging of the head and cervical spine was performed following the standard protocol without intravenous contrast. Multiplanar CT image reconstructions of the cervical spine were also generated. RADIATION DOSE REDUCTION: This exam was performed according to the departmental dose-optimization program which includes automated exposure control, adjustment of the mA and/or kV according to patient size and/or use of iterative reconstruction technique. COMPARISON:  None Available. FINDINGS: CT HEAD FINDINGS Brain: There is no mass, hemorrhage or extra-axial collection. The size and configuration of the ventricles and extra-axial CSF spaces are normal. There is hypoattenuation of the periventricular white matter, most  commonly indicating chronic ischemic microangiopathy. Vascular: No abnormal hyperdensity of the major intracranial arteries or dural venous sinuses. No intracranial atherosclerosis. Skull: The visualized skull base, calvarium and extracranial soft tissues are normal. Sinuses/Orbits: No fluid levels or advanced mucosal thickening of the visualized paranasal sinuses. No mastoid or middle ear effusion. The orbits are normal. CT CERVICAL SPINE FINDINGS Alignment: No static subluxation. Facets are aligned. Occipital condyles are normally positioned. Skull base and vertebrae: No acute fracture. Soft tissues and spinal canal: No prevertebral fluid or swelling. No visible canal hematoma. Disc levels: Multilevel degenerative disc disease without bony  spinal canal stenosis. Upper chest: No pneumothorax, pulmonary nodule or pleural effusion. Other: Normal visualized paraspinal cervical soft tissues. IMPRESSION: 1. No acute intracranial abnormality. 2. Chronic ischemic microangiopathy. 3. No acute fracture or static subluxation of the cervical spine. Electronically Signed   By: Ulyses Jarred M.D.   On: 05/21/2022 20:54   CT Cervical Spine Wo Contrast  Result Date: 05/21/2022 CLINICAL DATA:  Head trauma EXAM: CT HEAD WITHOUT CONTRAST CT CERVICAL SPINE WITHOUT CONTRAST TECHNIQUE: Multidetector CT imaging of the head and cervical spine was performed following the standard protocol without intravenous contrast. Multiplanar CT image reconstructions of the cervical spine were also generated. RADIATION DOSE REDUCTION: This exam was performed according to the departmental dose-optimization program which includes automated exposure control, adjustment of the mA and/or kV according to patient size and/or use of iterative reconstruction technique. COMPARISON:  None Available. FINDINGS: CT HEAD FINDINGS Brain: There is no mass, hemorrhage or extra-axial collection. The size and configuration of the ventricles and extra-axial CSF spaces  are normal. There is hypoattenuation of the periventricular white matter, most commonly indicating chronic ischemic microangiopathy. Vascular: No abnormal hyperdensity of the major intracranial arteries or dural venous sinuses. No intracranial atherosclerosis. Skull: The visualized skull base, calvarium and extracranial soft tissues are normal. Sinuses/Orbits: No fluid levels or advanced mucosal thickening of the visualized paranasal sinuses. No mastoid or middle ear effusion. The orbits are normal. CT CERVICAL SPINE FINDINGS Alignment: No static subluxation. Facets are aligned. Occipital condyles are normally positioned. Skull base and vertebrae: No acute fracture. Soft tissues and spinal canal: No prevertebral fluid or swelling. No visible canal hematoma. Disc levels: Multilevel degenerative disc disease without bony spinal canal stenosis. Upper chest: No pneumothorax, pulmonary nodule or pleural effusion. Other: Normal visualized paraspinal cervical soft tissues. IMPRESSION: 1. No acute intracranial abnormality. 2. Chronic ischemic microangiopathy. 3. No acute fracture or static subluxation of the cervical spine. Electronically Signed   By: Ulyses Jarred M.D.   On: 05/21/2022 20:54   DG Hip Unilat With Pelvis 2-3 Views Left  Result Date: 05/21/2022 CLINICAL DATA:  Fall, left hip pain EXAM: DG HIP (WITH OR WITHOUT PELVIS) 2-3V LEFT COMPARISON:  CT pelvis 07/22/2021 FINDINGS: Acute subcapital fracture of the left femoral neck observed with mild cortical discontinuity but no substantial angulation at this time. There is about 3 mm of displacement. Suspected linear 1 cm fragment along the lateral margin of the fracture plane. No trochanteric or intertrochanteric component identified. Lower lumbar posterolateral rod and pedicle screws noted. IMPRESSION: 1. Acute subcapital fracture of the left femoral neck with about 3 mm of displacement. Electronically Signed   By: Van Clines M.D.   On: 05/21/2022 16:48    DG Femur Min 2 Views Left  Result Date: 05/21/2022 CLINICAL DATA:  Fall, left hip pain EXAM: LEFT FEMUR 2 VIEWS COMPARISON:  Hip radiographs from 05/21/2022 FINDINGS: There is cortical discontinuity in the subcapital portion of the femoral neck compatible with acute femoral neck fracture. Distal superficial femoral artery and popliteal artery atherosclerotic calcification. Total knee prosthesis noted. No periprosthetic fracture is identified. IMPRESSION: 1. Acute subcapital fracture of the left femoral neck. 2. Total knee prosthesis noted. Electronically Signed   By: Van Clines M.D.   On: 05/21/2022 16:46    Microbiology: Results for orders placed or performed during the hospital encounter of 05/21/22  Surgical pcr screen     Status: None   Collection Time: 05/22/22  6:27 AM   Specimen: Nasal Mucosa; Nasal Swab  Result Value  Ref Range Status   MRSA, PCR NEGATIVE NEGATIVE Final   Staphylococcus aureus NEGATIVE NEGATIVE Final    Comment: (NOTE) The Xpert SA Assay (FDA approved for NASAL specimens in patients 48 years of age and older), is one component of a comprehensive surveillance program. It is not intended to diagnose infection nor to guide or monitor treatment. Performed at [facility], [place] [address], [place],  [address]     Labs: CBC: Recent Labs  Lab 05/21/22 1610 05/22/22 0319 05/23/22 0614  WBC 10.4 9.1 8.4  NEUTROABS 8.9*  --   --   HGB 13.9 14.8 13.6  HCT 42.6 45.8 42.6  MCV 94.9 95.0 95.9  PLT 178 145* 0000000   Basic Metabolic Panel: Recent Labs  Lab 05/21/22 1610 05/22/22 0319 05/23/22 0614  NA 141 138 138  K 4.3 4.0 4.5  CL 104 103 101  CO2 28 25 27   GLUCOSE 128* 99 121*  BUN 21 19 22   CREATININE 1.14* 0.90 1.16*  CALCIUM 9.1 8.8* 8.9   Liver Function Tests: Recent Labs  Lab 05/21/22 1610  AST 25  ALT 14  ALKPHOS 81  BILITOT 0.5  PROT 6.6  ALBUMIN 4.0   CBG: Recent Labs  Lab 05/21/22 1608  GLUCAP 128*     Discharge time spent: greater than 30 minutes.  This record has been created using Systems analyst. Errors have been sought and corrected,but may not always be located. Such creation errors do not reflect on the standard of care.   Signed: Lorella Nimrod, MD [facility] 05/25/2022

## 2022-05-25 NOTE — Plan of Care (Signed)
  Problem: Education: Goal: Knowledge of General Education information will improve Description: Including pain rating scale, medication(s)/side effects and non-pharmacologic comfort measures Outcome: Adequate for Discharge   Problem: Health Behavior/Discharge Planning: Goal: Ability to manage health-related needs will improve Outcome: Adequate for Discharge   Problem: Clinical Measurements: Goal: Ability to maintain clinical measurements within normal limits will improve Outcome: Adequate for Discharge Goal: Will remain free from infection 05/25/2022 1349 by Lise Auer, LPN Outcome: Adequate for Discharge 05/25/2022 0756 by Lise Auer, LPN Outcome: Progressing Goal: Diagnostic test results will improve Outcome: Adequate for Discharge Goal: Respiratory complications will improve Outcome: Adequate for Discharge Goal: Cardiovascular complication will be avoided Outcome: Adequate for Discharge   Problem: Activity: Goal: Risk for activity intolerance will decrease 05/25/2022 1349 by Iver Nestle A, LPN Outcome: Adequate for Discharge 05/25/2022 0756 by Lise Auer, LPN Outcome: Progressing   Problem: Nutrition: Goal: Adequate nutrition will be maintained 05/25/2022 1349 by Lise Auer, LPN Outcome: Adequate for Discharge 05/25/2022 0756 by Lise Auer, LPN Outcome: Progressing   Problem: Coping: Goal: Level of anxiety will decrease Outcome: Adequate for Discharge   Problem: Elimination: Goal: Will not experience complications related to bowel motility Outcome: Adequate for Discharge Goal: Will not experience complications related to urinary retention Outcome: Adequate for Discharge   Problem: Pain Managment: Goal: General experience of comfort will improve Outcome: Adequate for Discharge   Problem: Safety: Goal: Ability to remain free from injury will improve Outcome: Adequate for Discharge   Problem: Skin Integrity: Goal: Risk for impaired skin  integrity will decrease Outcome: Adequate for Discharge   Problem: Education: Goal: Verbalization of understanding the information provided (i.e., activity precautions, restrictions, etc) will improve Outcome: Adequate for Discharge Goal: Individualized Educational Video(s) Outcome: Adequate for Discharge   Problem: Activity: Goal: Ability to ambulate and perform ADLs will improve Outcome: Adequate for Discharge   Problem: Clinical Measurements: Goal: Postoperative complications will be avoided or minimized Outcome: Adequate for Discharge   Problem: Self-Concept: Goal: Ability to maintain and perform role responsibilities to the fullest extent possible will improve Outcome: Adequate for Discharge   Problem: Pain Management: Goal: Pain level will decrease Outcome: Adequate for Discharge

## 2022-05-26 NOTE — Progress Notes (Signed)
 [facility]:  Post Acute and Long Term Care Medicine    Admission History and Physical to SNF Rehab - [facility]  Visit Date: 05/26/2022  Patient: Becky Mccall Date of Birth: 06-15-1929  Provider: Ozell Norleen Hives, MD    SUBJECTIVE   HPI: Becky Mccall is a 89 y.o. female, outside resident, with hx of CAD, COPD on room air, CKD stage II, GERD, hyperlipidemia, hypertension  who presents from [facility] (3/29-05/25/22) for fall and found to have acute left hip fx. She was seen by ortho and ORIF was performed without complication. Post op course notable for mild hypoxia treated with NCO2. Once stable it was decided to bring her to SNF rehab unit and she is seen today sitting up in bed in her room watching TV. She is AA and has no acute concerns for me. Pain is controlled.   From discharge summary: * Hip fracture requiring operative repair, left, closed, initial encounter Secondary to mechanical fall. Patient is mobile and independent at baseline. Orthopedic was consulted and s/p pinning of left hip -Patient will be nonweightbearing until seen in clinic as outpatient by orthopedic surgery. -PT/OT evaluation -Continue with pain management and supportive care  CAD (coronary artery disease), native coronary artery No acute concerns no chest pain. -Continue with home aspirin  and statin  Essential hypertension -Continue home amlodipine   COPD (chronic obstructive pulmonary disease) No acute concern. -Continue home inhalers    Past Medical Hx:  Brain aneurysm  CAD in native artery  a. [facility] 11/15/16 showing normal LVEDP, mild nonobstructive CAD (30% prox RCA, 40% ostial RPDA, 45% D1, 45% dLAD), severe AS with mean gradient 40.75mmHg, AVA 0.76cm2.  Carotid stenosis  a. 1-39% by duplex 2018.  Cervical spondylolysis  Chronic lower back pain  CKD (chronic kidney disease), stage II  COPD (chronic obstructive pulmonary disease) (HCC)  mild (12/09/2016)  DDD (degenerative  disc disease), lumbar  GERD (gastroesophageal reflux disease)  Hard of hearing  wears hearing aides both ears  Hyperlipidemia  can't take the meds so takes Fish Oil  Hypertension  Hypothyroidism  IBS (irritable bowel syndrome)  Intermittent dysphagia  for pills and solids  Osteopenia  S/P TAVR (transcatheter aortic valve replacement) 12/07/2016  26 mm Edwards Sapien 3 transcatheter heart valve placed via open left transcarotid approach  Severe aortic stenosis  a. s/p TAVR 11/2016.  Vertigo   Past Surgical Hx:  ABDOMINAL HYSTERECTOMY  APPENDECTOMY  BACK SURGERY 2009  on her lower back; not sure what they did (12/09/2016)  BLEPHAROPLASTY Bilateral  CARDIAC CATHETERIZATION  CARDIAC VALVE REPLACEMENT 12/07/2016  Edwards Sapien 3 THV (size 26 mm, model # 9600TFX, serial # [id number]  CATARACT EXTRACTION W/ INTRAOCULAR LENS IMPLANT, BILATERAL 1999,2000  CATARACT EXTRACTION, BILATERAL Bilateral  COLONOSCOPY W/ BIOPSIES AND POLYPECTOMY 11/2003  thelbert 07/08/2010  EYE SURGERY  HEMORROIDECTOMY  LUMBAR LAMINECTOMY 1999  LUMBAR LAMINECTOMY/DECOMPRESSION MICRODISCECTOMY 09/1999  thelbert 07/08/2010  POSTERIOR FUSION LUMBAR SPINE 2014  RIGHT/LEFT HEART CATH AND CORONARY ANGIOGRAPHY N/A 11/15/2016  Procedure: RIGHT/LEFT HEART CATH AND CORONARY ANGIOGRAPHY; Surgeon: Anner Alm ORN, MD; Location: [facility] INVASIVE CV LAB; Service: Cardiovascular; Laterality: N/A;  SKIN GRAFT SPLIT THICKNESS LEG / FOOT Left 09/2000  thelbert 07/08/2010  TEE WITHOUT CARDIOVERSION N/A 12/07/2016  Procedure: TRANSESOPHAGEAL ECHOCARDIOGRAM (TEE); Surgeon: Wonda Ozell, MD; Location: [facility] OR; Service: Open Heart Surgery; Laterality: N/A;  TONSILLECTOMY  TOTAL KNEE ARTHROPLASTY Left 04/11/2018  TOTAL KNEE ARTHROPLASTY Left 04/11/2018  Procedure: LEFT TOTAL KNEE ARTHROPLASTY; Surgeon: Vernetta Lonni GRADE, MD; Location: [facility] OR; Service: Orthopedics; Laterality:  Left;  TRANSCATHETER AORTIC VALVE REPLACEMENT, TRANSFEMORAL  12/07/2016  Edwards Sapien 3 THV (size 26 mm, model # 9600TFX, serial # [id number]   Medications: see MAR  Allergies:  Tape Rash and Other (See Comments)  Developed blisters from clear tape used over cath site 10/2016  Bacitra-Neomycin-Polymyxin-Hc Rash  Bacitracin -Polymyxin B Rash  Lisinopril Cough  5-Alpha Reductase Inhibitors Other (See Comments)  Muscle pain  Ezetimibe Other (See Comments)  Muscle pain  Statins Other (See Comments)  Muscle pain  Welchol [Colesevelam] Other (See Comments)  Muscle pain  Nabumetone Other (See Comments)  CHEST PAIN  Neosporin [Neomycin-Bacitracin  Zn-Polymyx] Other (See Comments)  Cause wound/scratch to worsen  Zinc Oxide Rash   Family History:  Cancer - Lung Mother  Heart attack Father  Cancer - Lung Brother  Cancer - Lung Maternal Aunt   Social History: reports that she quit smoking about 32 years ago. Her smoking use included cigarettes. She has a 80.00 pack-year smoking history. She has never used smokeless tobacco. She reports current alcohol use of about 14.0 standard drinks of alcohol per week. She reports that she does not use drugs.  She is widowed and lives alone. 2 children live close by and she has caregivers during the afternoons and nights. Between caregivers and family she has close to 24/7 care at home.    Review of Systems  Negative except HPI   OBJECTIVE   Physical Examination:  Physical Exam Vitals and nursing note reviewed.  Constitutional:      General: She is not in acute distress.    Appearance: She is not ill-appearing.  HENT:     Head: Normocephalic and atraumatic.     Nose:     Comments: NCO2 in place    Mouth/Throat:     Pharynx: Oropharynx is clear. No oropharyngeal exudate.  Eyes:     General: No scleral icterus.    Conjunctiva/sclera: Conjunctivae normal.  Cardiovascular:     Rate and Rhythm: Normal rate and regular rhythm.     Heart sounds: Normal heart sounds. No murmur heard.    No gallop.   Pulmonary:     Effort: Pulmonary effort is normal.     Breath sounds: Normal breath sounds. No wheezing, rhonchi or rales.  Abdominal:     General: Bowel sounds are normal. There is no distension.     Tenderness: There is no abdominal tenderness.  Musculoskeletal:     Cervical back: Neck supple.     Right lower leg: No edema.     Left lower leg: No edema.  Skin:    General: Skin is warm and dry.     Findings: No rash.     Comments: Left hip wound c/d/I. RT leg ST is dressed.  Neurological:     General: No focal deficit present.     Mental Status: She is alert and oriented to person, place, and time.     Motor: Weakness (general) present.  Psychiatric:        Mood and Affect: Mood normal.        Behavior: Behavior normal.      Review of labsand imaging from [facility]): No results found for this or any previous visit (from the past 168 hour(s)).  CBC (05/23/2022 6:14 AM EDT) Lab Results - CBC (05/23/2022 6:14 AM EDT) Component Value Ref Range Test Method Analysis Time Performed At Pathologist Signature  WBC 8.4 4.0 - 10.5 K/uL     [place] [facility]  RBC 4.44 3.87 - 5.11 MIL/uL     [place] [facility]    Hemoglobin 13.6 12.0 - 15.0 g/dL     [place] [facility]    HCT 42.6 36.0 - 46.0 %     [place] [facility]    MCV 95.9 80.0 - 100.0 fL     [place] [facility]    MCH 30.6 26.0 - 34.0 pg     [place] [facility]    MCHC 31.9 30.0 - 36.0 g/dL     [place] [facility]    RDW 12.6 11.5 - 15.5 %     [place] [facility]    Platelets 153 150 - 400 K/uL     [place] [facility]    nRBC 0.0 0.0 - 0.2 %     [place] [facility]     (ABNORMAL) Basic metabolic panel (05/23/2022 6:14 AM EDT) Lab Results - (ABNORMAL) Basic metabolic panel (05/23/2022 6:14 AM EDT) Component Value Ref Range Test Method Analysis Time Performed At Pathologist Signature   Sodium 138 135 - 145 mmol/L     [place] [facility]    Potassium 4.5 3.5 - 5.1 mmol/L     [place] [facility]    Chloride 101 98 - 111 mmol/L     [place] [facility]    CO2 27 22 - 32 mmol/L     [place] [facility]    Glucose, Bld 121 (H) 70 - 99 mg/dL     [place] [facility]    Comment: Glucose reference range applies only to samples taken after fasting for at least 8 hours.  BUN 22 8 - 23 mg/dL     [place] [facility]    Creatinine, Ser 1.16 (H) 0.44 - 1.00 mg/dL     [place] [facility]    Calcium  8.9 8.9 - 10.3 mg/dL     [place]  [facility]    GFR, Estimated 45 (L) >60 mL/min     [place] [facility]    Comment: (NOTE) Calculated using the CKD-EPI Creatinine Equation (2021)   Anion gap 10 5 - 15      CL[facility] LAB[facility]ATORY      ASSESSMENT AND PLAN   Code Status: DNR  1. Fall, left hip fx s/p ORIF - PT/OT., NWB until ortho f/u as directed. Pain control with Tylenol , Ultram . ASA for DVT proph. Ca+D. Colace for GI proph.  2. COPD with mild hypoxia - cont Spiriva , wean O2 as tolerated  3. CAD - cont ASA, statin  4. HTN - cont norvasc   5. Dementia - cont Aricept   6. Depression - cont Cymbalta , Remeron   7. Hypothyroid - cont HRT   Due to multiple morbidities noted above, this patient is at significant risk of worsening medical status and readmission to the hospital.   Electronicallysigned by: Ozell Norleen Hives, MD 05/26/2022 2:02 PM   This document contains confidential information from a [facility] medical record system and may be unauthenticated.  Release may be made only with a valid authorization or in accordance with applicable policies of the Medical Center or its affiliates. This document must be maintained in a secure manner or discarded/destroyed as required by Medical Center policy or by a confidential means such as shredding.

## 2022-05-31 ENCOUNTER — Other Ambulatory Visit (INDEPENDENT_AMBULATORY_CARE_PROVIDER_SITE_OTHER): Payer: Medicare Other

## 2022-05-31 ENCOUNTER — Ambulatory Visit (INDEPENDENT_AMBULATORY_CARE_PROVIDER_SITE_OTHER): Payer: Medicare Other | Admitting: Surgical

## 2022-05-31 DIAGNOSIS — M25552 Pain in left hip: Secondary | ICD-10-CM | POA: Diagnosis not present

## 2022-06-02 ENCOUNTER — Encounter: Payer: Self-pay | Admitting: Surgical

## 2022-06-02 NOTE — Progress Notes (Signed)
Post-Op Visit Note   Patient: Becky Mccall           Date of Birth: [birth year]           MRN: [id number] Visit Date: 05/31/2022 PCP: Laurann Montana, MD   Assessment & Plan:  Chief Complaint:  Chief Complaint  Patient presents with   Left Hip - Routine Post Op   Visit Diagnoses:  1. Pain in left hip     Plan: Patient is a 89 year old female who presents s/p left hip fracture fixation with 3 screws on 05/22/2022.  She has been staying in skilled nursing facility.  She has been nonweightbearing and not doing anything with physical therapy due to her limitations.  She is accompanied by her daughter today.  States that pain is well-controlled and really does not have any discomfort or pain.  She has no fevers or chills.  No complaint of new chest pain or shortness of breath.  No calf pain.  On exam, patient has no pain with hip range of motion whatsoever.  She has incision that is healing well without evidence of infection or dehiscence.  Sutures were intact but these were removed and replaced with Steri-Strips.  She also had 3 sutures that were intact and a skin tear on the right calf.  The sutures were removed as well and the skin tear looks to be healing well.  We will apply Adaptic dressing over top of the skin tear today and have the skilled nursing facility periodically change her dressing with either Adaptic or petroleum gauze and wrap with Ace bandage.  She also had her hip tested with axial loading while she was seated and she had no pain with this whatsoever.  No calf tenderness.  Negative Homans' sign.  Intact ankle dorsiflexion and plantarflexion.  Palpable DP pulse.  She is able to perform straight leg raise.  Intact hip flexion actively.  Radiographs demonstrate hip fracture that appears in good alignment with no change in position of the hardware.  Plan to gradually increase her weightbearing status starting at 25% weightbearing with a walker to 50% weightbearing with a  walker over the next 2 weeks.  We will see her back in the office in 2 weeks for clinical recheck with Dr. August Saucer and new radiographs at that time in order to ensure no fracture displacement.  Call with any concerns.  Follow-Up Instructions: No follow-ups on file.   Orders:  Orders Placed This Encounter  Procedures   XR HIP UNILAT W OR W/O PELVIS 2-3 VIEWS LEFT   No orders of the defined types were placed in this encounter.   Imaging: No results found.  PMFS History: Patient Active Problem List   Diagnosis Date Noted   Hip fracture requiring operative repair, left, closed, initial encounter 05/21/2022   Other fracture of left femur, initial encounter for closed fracture 05/21/2022   COVID-19 virus infection 01/15/2022   Generalized weakness 01/15/2022   COVID-19 01/15/2022   DDD (degenerative disc disease), lumbar 07/04/2020   OSA (obstructive sleep apnea) 07/04/2020   Status post total left knee replacement 04/11/2018   CAD (coronary artery disease), native coronary artery 01/24/2018   PVC's (premature ventricular contractions) 10/11/2017   Leg cramps 10/11/2017   Essential hypertension 10/10/2017   Chronic pain of right knee 03/17/2017   Unilateral primary osteoarthritis, left knee 03/17/2017   Unilateral primary osteoarthritis, right knee 03/17/2017   Chronic pain of left knee 02/07/2017   S/P TAVR (transcatheter aortic valve replacement)  12/07/2016   Severe aortic stenosis 12/07/2016   Fibrocystic breast disease    IBS (irritable bowel syndrome)    Cervical spondylolysis    Osteopenia    Vitamin D deficiency    Squamous cell skin cancer    Carotid stenosis    Colon polyp    Vertigo 05/04/2013   Hard of hearing    Hyperlipidemia    COPD (chronic obstructive pulmonary disease)    Hypothyroidism    GERD (gastroesophageal reflux disease)    Spinal stenosis in cervical region 02/19/2013   Past Medical History:  Diagnosis Date   Brain aneurysm    CAD in native  artery    [facility], mild nonobstructive CAD (30% prox RCA, 40% ostial RPDA, 45% D1, 45% dLAD), severe AS with mean gradient 40.725mmHg, AVA 0.76cm2.    Carotid stenosis    a. 1-39% by duplex 2018.   Cervical spondylolysis    Chronic lower back pain    CKD (chronic kidney disease), stage II    COPD (chronic obstructive pulmonary disease)    "mild" (12/09/2016)   DDD (degenerative disc disease), lumbar    GERD (gastroesophageal reflux disease)    Hard of hearing    wears hearing aides both ears   Hyperlipidemia    can't take the meds so takes Fish Oil   Hypertension    Hypothyroidism    IBS (irritable bowel syndrome)    Intermittent dysphagia    for pills and solids   Osteopenia    S/P TAVR (transcatheter aortic valve replacement) 12/07/2016   26 mm Edwards Sapien 3 transcatheter heart valve placed via open left transcarotid approach    Severe aortic stenosis    a. s/p TAVR 11/2016.   Vertigo     Family History  Problem Relation Age of Onset   Cancer - Lung Mother    Heart attack Father    Cancer - Lung Brother    Cancer - Lung Maternal Aunt     Past Surgical History:  Procedure Laterality Date   ABDOMINAL HYSTERECTOMY     APPENDECTOMY     BACK SURGERY  2009   "on her lower back; not sure what they did" (12/09/2016)   BLEPHAROPLASTY Bilateral    CARDIAC CATHETERIZATION     CARDIAC VALVE REPLACEMENT  12/07/2016   Edwards Sapien 3 THV (size 26 mm, model # 9600TFX, serial # [id number]   CATARACT EXTRACTION W/ INTRAOCULAR LENS  IMPLANT, BILATERAL  1999,2000   CATARACT EXTRACTION, BILATERAL Bilateral    COLONOSCOPY W/ BIOPSIES AND POLYPECTOMY  11/2003   Hattie Perch/notes 07/08/2010   EYE SURGERY     HEMORROIDECTOMY     LUMBAR LAMINECTOMY  1999   LUMBAR LAMINECTOMY/DECOMPRESSION MICRODISCECTOMY  09/1999   Hattie Perch/notes 07/08/2010    ORIF HIP FRACTURE Left 05/22/2022   Procedure: OPEN REDUCTION INTERNAL FIXATION HIP;  Surgeon: Cammy Copaean, Gregory Scott, MD;  Location: [facility]  ORS;  Service: Orthopedics;  Laterality: Left;   POSTERIOR FUSION LUMBAR SPINE  2014   RIGHT/LEFT HEART CATH AND CORONARY ANGIOGRAPHY N/A 11/15/2016   Procedure: RIGHT/LEFT HEART CATH AND CORONARY ANGIOGRAPHY;  Surgeon: Marykay LexHarding, David W, MD;  [facility];  Service: Cardiovascular;  Laterality: N/A;   SKIN GRAFT SPLIT THICKNESS LEG / FOOT Left 09/2000   Hattie Perch/notes 07/08/2010   TEE WITHOUT CARDIOVERSION N/A 12/07/2016   Procedure: TRANSESOPHAGEAL ECHOCARDIOGRAM (TEE);  Surgeon: Tonny Bollmanooper, Michael, MD;  [facility];  Service: Open Heart Surgery;  Laterality: N/A;  TONSILLECTOMY     TOTAL KNEE ARTHROPLASTY Left 04/11/2018   TOTAL KNEE ARTHROPLASTY Left 04/11/2018   Procedure: LEFT TOTAL KNEE ARTHROPLASTY;  Surgeon: Kathryne Hitch, MD;  Location: [facility] OR;  Service: Orthopedics;  Laterality: Left;   TRANSCATHETER AORTIC VALVE REPLACEMENT, TRANSFEMORAL  12/07/2016   Edwards Sapien 3 THV (size 26 mm, model # 9600TFX, serial # [id number]   Social History   Occupational History   Occupation: retired  Tobacco Use   Smoking status: Former    Packs/day: 2.00    Years: 40.00    Additional pack years: 0.00    Total pack years: 80.00    Types: Cigarettes    Quit date: 08/02/1989    Years since quitting: 32.8   Smokeless tobacco: Never  Vaping Use   Vaping Use: Never used  Substance and Sexual Activity   Alcohol use: Yes    Alcohol/week: 14.0 standard drinks of alcohol    Types: 7 Glasses of wine, 7 Standard drinks or equivalent per week   Drug use: No   Sexual activity: Not Currently    Birth control/protection: Surgical

## 2022-06-14 ENCOUNTER — Ambulatory Visit: Payer: Medicare Other | Admitting: Surgical

## 2022-06-14 ENCOUNTER — Other Ambulatory Visit: Payer: Self-pay

## 2022-06-14 DIAGNOSIS — M25552 Pain in left hip: Secondary | ICD-10-CM

## 2022-06-16 ENCOUNTER — Encounter: Payer: Self-pay | Admitting: Surgical

## 2022-06-16 NOTE — Progress Notes (Signed)
Post-Op Visit Note   Patient: Becky Mccall           Date of Birth: 1929-10-24           MRN: [id number] Visit Date: 06/14/2022 PCP: Laurann Montana, MD   Assessment & Plan:  Chief Complaint:  Chief Complaint  Patient presents with   Left Hip - Routine Post Op   Visit Diagnoses:  1. Pain in left hip     Plan: Patient is a 89 year old female who presents s/p ORIF of left hip fracture on 05/22/2022.  She is doing very well overall.  She is staying in skilled nursing facility and has been 50% partial weightbearing with a walker.  She states that she does not really have any significant pain with ambulation but does note a tightness sensation in the proximal aspect of the anterior thigh.  She denies any groin pain.  No fevers or chills.  No calf pain.  No chest pain/shortness of breath.  On exam, patient has incision that looks to be healing well without evidence of infection or dehiscence.  She has no calf tenderness.  Negative outside.  She has a skin tear on the right lower extremity calf that looks to be healing well.  This was replaced with Adaptic dressing and wrapped with an Ace bandage again.  The left lower extremity had axial load applied to the hip joint to simulate walking and she had absolutely no pain with this yet again.  No pain with hip range of motion.  Negative FADIR sign.  She has a active hip flexion intact.  She has radiographs of the left hip taken today demonstrating no significant change in position of the hardware and no displacement/angulation at the fracture site relative to radiographs from several weeks ago.  Plan is to progress to weightbearing as tolerated with walker.  Continue with physical therapy and follow-up in 3 weeks for clinical recheck with Dr. August Saucer.  If there are any concerns in the meantime, patient and daughter were instructed to call the office.  They agreed with plan.  Follow-up in 3 weeks.  Need new radiographs at that time  Follow-Up  Instructions: No follow-ups on file.   Orders:  Orders Placed This Encounter  Procedures   XR HIP UNILAT W OR W/O PELVIS 2-3 VIEWS LEFT   No orders of the defined types were placed in this encounter.   Imaging: No results found.  PMFS History: Patient Active Problem List   Diagnosis Date Noted   Hip fracture requiring operative repair, left, closed, initial encounter 05/21/2022   Other fracture of left femur, initial encounter for closed fracture 05/21/2022   COVID-19 virus infection 01/15/2022   Generalized weakness 01/15/2022   COVID-19 01/15/2022   DDD (degenerative disc disease), lumbar 07/04/2020   OSA (obstructive sleep apnea) 07/04/2020   Status post total left knee replacement 04/11/2018   CAD (coronary artery disease), native coronary artery 01/24/2018   PVC's (premature ventricular contractions) 10/11/2017   Leg cramps 10/11/2017   Essential hypertension 10/10/2017   Chronic pain of right knee 03/17/2017   Unilateral primary osteoarthritis, left knee 03/17/2017   Unilateral primary osteoarthritis, right knee 03/17/2017   Chronic pain of left knee 02/07/2017   S/P TAVR (transcatheter aortic valve replacement) 12/07/2016   Severe aortic stenosis 12/07/2016   Fibrocystic breast disease    IBS (irritable bowel syndrome)    Cervical spondylolysis    Osteopenia    Vitamin D deficiency    Squamous cell  skin cancer    Carotid stenosis    Colon polyp    Vertigo 05/04/2013   Hard of hearing    Hyperlipidemia    COPD (chronic obstructive pulmonary disease)    Hypothyroidism    GERD (gastroesophageal reflux disease)    Spinal stenosis in cervical region 02/19/2013   Past Medical History:  Diagnosis Date   Brain aneurysm    CAD in native artery    a.  [facility] 11/15/16 showing normal LVEDP, mild nonobstructive CAD (30% prox RCA, 40% ostial RPDA, 45% D1, 45% dLAD), severe AS with mean gradient 40.69mmHg, AVA 0.76cm2.    Carotid stenosis    a. 1-39% by duplex 2018.    Cervical spondylolysis    Chronic lower back pain    CKD (chronic kidney disease), stage II    COPD (chronic obstructive pulmonary disease)    "mild" (12/09/2016)   DDD (degenerative disc disease), lumbar    GERD (gastroesophageal reflux disease)    Hard of hearing    wears hearing aides both ears   Hyperlipidemia    can't take the meds so takes Fish Oil   Hypertension    Hypothyroidism    IBS (irritable bowel syndrome)    Intermittent dysphagia    for pills and solids   Osteopenia    S/P TAVR (transcatheter aortic valve replacement) 12/07/2016   26 mm Edwards Sapien 3 transcatheter heart valve placed via open left transcarotid approach    Severe aortic stenosis    a. s/p TAVR 11/2016.   Vertigo     Family History  Problem Relation Age of Onset   Cancer - Lung Mother    Heart attack Father    Cancer - Lung Brother    Cancer - Lung Maternal Aunt     Past Surgical History:  Procedure Laterality Date   ABDOMINAL HYSTERECTOMY     APPENDECTOMY     BACK SURGERY  2009   "on her lower back; not sure what they did" (12/09/2016)   BLEPHAROPLASTY Bilateral    CARDIAC CATHETERIZATION     CARDIAC VALVE REPLACEMENT  12/07/2016   Edwards Sapien 3 THV (size 26 mm, model # 9600TFX, serial # [id number]   CATARACT EXTRACTION W/ INTRAOCULAR LENS  IMPLANT, BILATERAL  1999,2000   CATARACT EXTRACTION, BILATERAL Bilateral    COLONOSCOPY W/ BIOPSIES AND POLYPECTOMY  11/2003   Hattie Perch 07/08/2010   EYE SURGERY     HEMORROIDECTOMY     LUMBAR LAMINECTOMY  1999   LUMBAR LAMINECTOMY/DECOMPRESSION MICRODISCECTOMY  09/1999   Hattie Perch 07/08/2010    ORIF HIP FRACTURE Left 05/22/2022   Procedure: OPEN REDUCTION INTERNAL FIXATION HIP;  Surgeon: Cammy Copa, MD;  Location: [facility];  Service: Orthopedics;  Laterality: Left;   POSTERIOR FUSION LUMBAR SPINE  2014   RIGHT/LEFT HEART CATH AND CORONARY ANGIOGRAPHY N/A 11/15/2016   Procedure: RIGHT/LEFT HEART CATH AND CORONARY ANGIOGRAPHY;  Surgeon: Marykay Lex, MD;  Location: [facility] INVASIVE CV LAB;  Service: Cardiovascular;  Laterality: N/A;   SKIN GRAFT SPLIT THICKNESS LEG / FOOT Left 09/2000   Hattie Perch 07/08/2010   TEE WITHOUT CARDIOVERSION N/A 12/07/2016   Procedure: TRANSESOPHAGEAL ECHOCARDIOGRAM (TEE);  Surgeon: Tonny Bollman, MD;  Location: [facility];  Service: Open Heart Surgery;  Laterality: N/A;   TONSILLECTOMY     TOTAL KNEE ARTHROPLASTY Left 04/11/2018   TOTAL KNEE ARTHROPLASTY Left 04/11/2018   Procedure: LEFT TOTAL KNEE ARTHROPLASTY;  Surgeon: Kathryne Hitch, MD;  Location: [facility] OR;  Service: Orthopedics;  Laterality:  Left;   TRANSCATHETER AORTIC VALVE REPLACEMENT, TRANSFEMORAL  12/07/2016   Edwards Sapien 3 THV (size 26 mm, model # 9600TFX, serial # [id number]   Social History   Occupational History   Occupation: retired  Tobacco Use   Smoking status: Former    Packs/day: 2.00    Years: 40.00    Additional pack years: 0.00    Total pack years: 80.00    Types: Cigarettes    Quit date: 08/02/1989    Years since quitting: 32.8   Smokeless tobacco: Never  Vaping Use   Vaping Use: Never used  Substance and Sexual Activity   Alcohol use: Yes    Alcohol/week: 14.0 standard drinks of alcohol    Types: 7 Glasses of wine, 7 Standard drinks or equivalent per week   Drug use: No   Sexual activity: Not Currently    Birth control/protection: Surgical

## 2022-06-24 DIAGNOSIS — E785 Hyperlipidemia, unspecified: Secondary | ICD-10-CM | POA: Diagnosis not present

## 2022-06-24 DIAGNOSIS — Z7982 Long term (current) use of aspirin: Secondary | ICD-10-CM | POA: Diagnosis not present

## 2022-06-24 DIAGNOSIS — I2510 Atherosclerotic heart disease of native coronary artery without angina pectoris: Secondary | ICD-10-CM | POA: Diagnosis not present

## 2022-06-24 DIAGNOSIS — G8929 Other chronic pain: Secondary | ICD-10-CM | POA: Diagnosis not present

## 2022-06-24 DIAGNOSIS — Z954 Presence of other heart-valve replacement: Secondary | ICD-10-CM | POA: Diagnosis not present

## 2022-06-24 DIAGNOSIS — J449 Chronic obstructive pulmonary disease, unspecified: Secondary | ICD-10-CM | POA: Diagnosis not present

## 2022-06-24 DIAGNOSIS — I6529 Occlusion and stenosis of unspecified carotid artery: Secondary | ICD-10-CM | POA: Diagnosis not present

## 2022-06-24 DIAGNOSIS — M8580 Other specified disorders of bone density and structure, unspecified site: Secondary | ICD-10-CM | POA: Diagnosis not present

## 2022-06-24 DIAGNOSIS — F32A Depression, unspecified: Secondary | ICD-10-CM | POA: Diagnosis not present

## 2022-06-24 DIAGNOSIS — S72002D Fracture of unspecified part of neck of left femur, subsequent encounter for closed fracture with routine healing: Secondary | ICD-10-CM | POA: Diagnosis not present

## 2022-06-24 DIAGNOSIS — K219 Gastro-esophageal reflux disease without esophagitis: Secondary | ICD-10-CM | POA: Diagnosis not present

## 2022-06-24 DIAGNOSIS — Z9181 History of falling: Secondary | ICD-10-CM | POA: Diagnosis not present

## 2022-06-24 DIAGNOSIS — H9190 Unspecified hearing loss, unspecified ear: Secondary | ICD-10-CM | POA: Diagnosis not present

## 2022-06-24 DIAGNOSIS — G4733 Obstructive sleep apnea (adult) (pediatric): Secondary | ICD-10-CM | POA: Diagnosis not present

## 2022-06-24 DIAGNOSIS — F0283 Dementia in other diseases classified elsewhere, unspecified severity, with mood disturbance: Secondary | ICD-10-CM | POA: Diagnosis not present

## 2022-06-24 DIAGNOSIS — I129 Hypertensive chronic kidney disease with stage 1 through stage 4 chronic kidney disease, or unspecified chronic kidney disease: Secondary | ICD-10-CM | POA: Diagnosis not present

## 2022-06-24 DIAGNOSIS — R1310 Dysphagia, unspecified: Secondary | ICD-10-CM | POA: Diagnosis not present

## 2022-06-24 DIAGNOSIS — F02818 Dementia in other diseases classified elsewhere, unspecified severity, with other behavioral disturbance: Secondary | ICD-10-CM | POA: Diagnosis not present

## 2022-06-24 DIAGNOSIS — M4302 Spondylolysis, cervical region: Secondary | ICD-10-CM | POA: Diagnosis not present

## 2022-06-24 DIAGNOSIS — Z602 Problems related to living alone: Secondary | ICD-10-CM | POA: Diagnosis not present

## 2022-06-24 DIAGNOSIS — M5136 Other intervertebral disc degeneration, lumbar region: Secondary | ICD-10-CM | POA: Diagnosis not present

## 2022-06-24 DIAGNOSIS — M545 Low back pain, unspecified: Secondary | ICD-10-CM | POA: Diagnosis not present

## 2022-06-24 DIAGNOSIS — K589 Irritable bowel syndrome without diarrhea: Secondary | ICD-10-CM | POA: Diagnosis not present

## 2022-06-24 DIAGNOSIS — E039 Hypothyroidism, unspecified: Secondary | ICD-10-CM | POA: Diagnosis not present

## 2022-06-24 DIAGNOSIS — N182 Chronic kidney disease, stage 2 (mild): Secondary | ICD-10-CM | POA: Diagnosis not present

## 2022-06-29 DIAGNOSIS — S72002D Fracture of unspecified part of neck of left femur, subsequent encounter for closed fracture with routine healing: Secondary | ICD-10-CM | POA: Diagnosis not present

## 2022-06-29 DIAGNOSIS — Z602 Problems related to living alone: Secondary | ICD-10-CM | POA: Diagnosis not present

## 2022-06-29 DIAGNOSIS — J449 Chronic obstructive pulmonary disease, unspecified: Secondary | ICD-10-CM | POA: Diagnosis not present

## 2022-06-29 DIAGNOSIS — G4733 Obstructive sleep apnea (adult) (pediatric): Secondary | ICD-10-CM | POA: Diagnosis not present

## 2022-06-29 DIAGNOSIS — E039 Hypothyroidism, unspecified: Secondary | ICD-10-CM | POA: Diagnosis not present

## 2022-06-29 DIAGNOSIS — I6529 Occlusion and stenosis of unspecified carotid artery: Secondary | ICD-10-CM | POA: Diagnosis not present

## 2022-06-29 DIAGNOSIS — F02818 Dementia in other diseases classified elsewhere, unspecified severity, with other behavioral disturbance: Secondary | ICD-10-CM | POA: Diagnosis not present

## 2022-06-29 DIAGNOSIS — Z7982 Long term (current) use of aspirin: Secondary | ICD-10-CM | POA: Diagnosis not present

## 2022-06-29 DIAGNOSIS — F0283 Dementia in other diseases classified elsewhere, unspecified severity, with mood disturbance: Secondary | ICD-10-CM | POA: Diagnosis not present

## 2022-06-29 DIAGNOSIS — I2510 Atherosclerotic heart disease of native coronary artery without angina pectoris: Secondary | ICD-10-CM | POA: Diagnosis not present

## 2022-06-29 DIAGNOSIS — I129 Hypertensive chronic kidney disease with stage 1 through stage 4 chronic kidney disease, or unspecified chronic kidney disease: Secondary | ICD-10-CM | POA: Diagnosis not present

## 2022-06-29 DIAGNOSIS — K219 Gastro-esophageal reflux disease without esophagitis: Secondary | ICD-10-CM | POA: Diagnosis not present

## 2022-06-29 DIAGNOSIS — F32A Depression, unspecified: Secondary | ICD-10-CM | POA: Diagnosis not present

## 2022-06-29 DIAGNOSIS — E785 Hyperlipidemia, unspecified: Secondary | ICD-10-CM | POA: Diagnosis not present

## 2022-06-29 DIAGNOSIS — Z9181 History of falling: Secondary | ICD-10-CM | POA: Diagnosis not present

## 2022-06-29 DIAGNOSIS — N182 Chronic kidney disease, stage 2 (mild): Secondary | ICD-10-CM | POA: Diagnosis not present

## 2022-07-02 DIAGNOSIS — F0283 Dementia in other diseases classified elsewhere, unspecified severity, with mood disturbance: Secondary | ICD-10-CM | POA: Diagnosis not present

## 2022-07-02 DIAGNOSIS — K219 Gastro-esophageal reflux disease without esophagitis: Secondary | ICD-10-CM | POA: Diagnosis not present

## 2022-07-02 DIAGNOSIS — Z602 Problems related to living alone: Secondary | ICD-10-CM | POA: Diagnosis not present

## 2022-07-02 DIAGNOSIS — I129 Hypertensive chronic kidney disease with stage 1 through stage 4 chronic kidney disease, or unspecified chronic kidney disease: Secondary | ICD-10-CM | POA: Diagnosis not present

## 2022-07-02 DIAGNOSIS — E039 Hypothyroidism, unspecified: Secondary | ICD-10-CM | POA: Diagnosis not present

## 2022-07-02 DIAGNOSIS — J449 Chronic obstructive pulmonary disease, unspecified: Secondary | ICD-10-CM | POA: Diagnosis not present

## 2022-07-02 DIAGNOSIS — Z9181 History of falling: Secondary | ICD-10-CM | POA: Diagnosis not present

## 2022-07-02 DIAGNOSIS — Z7982 Long term (current) use of aspirin: Secondary | ICD-10-CM | POA: Diagnosis not present

## 2022-07-02 DIAGNOSIS — F02818 Dementia in other diseases classified elsewhere, unspecified severity, with other behavioral disturbance: Secondary | ICD-10-CM | POA: Diagnosis not present

## 2022-07-02 DIAGNOSIS — N182 Chronic kidney disease, stage 2 (mild): Secondary | ICD-10-CM | POA: Diagnosis not present

## 2022-07-02 DIAGNOSIS — F32A Depression, unspecified: Secondary | ICD-10-CM | POA: Diagnosis not present

## 2022-07-02 DIAGNOSIS — E785 Hyperlipidemia, unspecified: Secondary | ICD-10-CM | POA: Diagnosis not present

## 2022-07-02 DIAGNOSIS — S72002D Fracture of unspecified part of neck of left femur, subsequent encounter for closed fracture with routine healing: Secondary | ICD-10-CM | POA: Diagnosis not present

## 2022-07-02 DIAGNOSIS — I6529 Occlusion and stenosis of unspecified carotid artery: Secondary | ICD-10-CM | POA: Diagnosis not present

## 2022-07-02 DIAGNOSIS — G4733 Obstructive sleep apnea (adult) (pediatric): Secondary | ICD-10-CM | POA: Diagnosis not present

## 2022-07-02 DIAGNOSIS — I2510 Atherosclerotic heart disease of native coronary artery without angina pectoris: Secondary | ICD-10-CM | POA: Diagnosis not present

## 2022-07-05 ENCOUNTER — Other Ambulatory Visit (INDEPENDENT_AMBULATORY_CARE_PROVIDER_SITE_OTHER): Payer: Medicare Other

## 2022-07-05 ENCOUNTER — Encounter: Payer: Self-pay | Admitting: Surgical

## 2022-07-05 ENCOUNTER — Ambulatory Visit: Payer: Medicare Other | Admitting: Surgical

## 2022-07-05 VITALS — Ht 66.0 in | Wt 176.0 lb

## 2022-07-05 DIAGNOSIS — I129 Hypertensive chronic kidney disease with stage 1 through stage 4 chronic kidney disease, or unspecified chronic kidney disease: Secondary | ICD-10-CM | POA: Diagnosis not present

## 2022-07-05 DIAGNOSIS — F02818 Dementia in other diseases classified elsewhere, unspecified severity, with other behavioral disturbance: Secondary | ICD-10-CM | POA: Diagnosis not present

## 2022-07-05 DIAGNOSIS — S72002D Fracture of unspecified part of neck of left femur, subsequent encounter for closed fracture with routine healing: Secondary | ICD-10-CM | POA: Diagnosis not present

## 2022-07-05 DIAGNOSIS — E039 Hypothyroidism, unspecified: Secondary | ICD-10-CM | POA: Diagnosis not present

## 2022-07-05 DIAGNOSIS — M25552 Pain in left hip: Secondary | ICD-10-CM | POA: Diagnosis not present

## 2022-07-05 DIAGNOSIS — K219 Gastro-esophageal reflux disease without esophagitis: Secondary | ICD-10-CM | POA: Diagnosis not present

## 2022-07-05 DIAGNOSIS — F0283 Dementia in other diseases classified elsewhere, unspecified severity, with mood disturbance: Secondary | ICD-10-CM | POA: Diagnosis not present

## 2022-07-05 DIAGNOSIS — F32A Depression, unspecified: Secondary | ICD-10-CM | POA: Diagnosis not present

## 2022-07-05 DIAGNOSIS — J449 Chronic obstructive pulmonary disease, unspecified: Secondary | ICD-10-CM | POA: Diagnosis not present

## 2022-07-05 DIAGNOSIS — Z9181 History of falling: Secondary | ICD-10-CM | POA: Diagnosis not present

## 2022-07-05 DIAGNOSIS — Z7982 Long term (current) use of aspirin: Secondary | ICD-10-CM | POA: Diagnosis not present

## 2022-07-05 DIAGNOSIS — I6529 Occlusion and stenosis of unspecified carotid artery: Secondary | ICD-10-CM | POA: Diagnosis not present

## 2022-07-05 DIAGNOSIS — I2510 Atherosclerotic heart disease of native coronary artery without angina pectoris: Secondary | ICD-10-CM | POA: Diagnosis not present

## 2022-07-05 DIAGNOSIS — N182 Chronic kidney disease, stage 2 (mild): Secondary | ICD-10-CM | POA: Diagnosis not present

## 2022-07-05 DIAGNOSIS — Z602 Problems related to living alone: Secondary | ICD-10-CM | POA: Diagnosis not present

## 2022-07-05 DIAGNOSIS — E785 Hyperlipidemia, unspecified: Secondary | ICD-10-CM | POA: Diagnosis not present

## 2022-07-05 DIAGNOSIS — G4733 Obstructive sleep apnea (adult) (pediatric): Secondary | ICD-10-CM | POA: Diagnosis not present

## 2022-07-06 ENCOUNTER — Encounter: Payer: Self-pay | Admitting: Surgical

## 2022-07-06 NOTE — Progress Notes (Signed)
Post-Op Visit Note   Patient: Becky Mccall           Date of Birth: October 11, 1929           MRN: [id number] Visit Date: 07/05/2022 PCP: Laurann Montana, MD   Assessment & Plan:  Chief Complaint:  Chief Complaint  Patient presents with   Left Hip - Follow-up, Routine Post Op    05/22/2022 ORIF left hip   Visit Diagnoses:  1. Pain in left hip     Plan: Patient is a 89 year old female who presents s/p ORIF of left hip fracture on 05/22/2022.  She states that she is doing very well with no complaints.  She is weightbearing as tolerated with a walker and she has returned home from skilled nursing facility.  She denies any chest pain, shortness of breath, calf pain.  She is constantly having someone in the house in order to keep an eye on her.  She has no pain with ambulation.  On exam, patient has intact hip flexion of the operative extremity.  She has no pain with hip range of motion of the left hip.  No pain with axial loading of the left hip.  She is able to take several steps in the clinic without any significant limp or pain.  No calf tenderness.  Negative Homans' sign.  Skin tear in the right calf is healing well without any evidence of infection.  Incision from left hip surgery is well-healed with no evidence of infection or dehiscence.  Plan is to have patient follow-up with the office as needed.  Radiographs taken today demonstrate no significant change in alignment at the fracture site compared with prior radiographs ever since patient has been weightbearing as tolerated.  Call with any concerns.  Follow-Up Instructions: No follow-ups on file.   Orders:  Orders Placed This Encounter  Procedures   XR HIP UNILAT W OR W/O PELVIS 2-3 VIEWS LEFT   No orders of the defined types were placed in this encounter.   Imaging: No results found.  PMFS History: Patient Active Problem List   Diagnosis Date Noted   Hip fracture requiring operative repair, left, closed, initial  encounter (HCC) 05/21/2022   Other fracture of left femur, initial encounter for closed fracture (HCC) 05/21/2022   COVID-19 virus infection 01/15/2022   Generalized weakness 01/15/2022   COVID-19 01/15/2022   DDD (degenerative disc disease), lumbar 07/04/2020   OSA (obstructive sleep apnea) 07/04/2020   Status post total left knee replacement 04/11/2018   CAD (coronary artery disease), native coronary artery 01/24/2018   PVC's (premature ventricular contractions) 10/11/2017   Leg cramps 10/11/2017   Essential hypertension 10/10/2017   Chronic pain of right knee 03/17/2017   Unilateral primary osteoarthritis, left knee 03/17/2017   Unilateral primary osteoarthritis, right knee 03/17/2017   Chronic pain of left knee 02/07/2017   S/P TAVR (transcatheter aortic valve replacement) 12/07/2016   Severe aortic stenosis 12/07/2016   Fibrocystic breast disease    IBS (irritable bowel syndrome)    Cervical spondylolysis    Osteopenia    Vitamin D deficiency    Squamous cell skin cancer    Carotid stenosis    Colon polyp    Vertigo 05/04/2013   Hard of hearing    Hyperlipidemia    COPD (chronic obstructive pulmonary disease) (HCC)    Hypothyroidism    GERD (gastroesophageal reflux disease)    Spinal stenosis in cervical region 02/19/2013   Past Medical History:  Diagnosis Date  Brain aneurysm    CAD in native artery    a.  [facility] 11/15/16 showing normal LVEDP, mild nonobstructive CAD (30% prox RCA, 40% ostial RPDA, 45% D1, 45% dLAD), severe AS with mean gradient 40.82mmHg, AVA 0.76cm2.    Carotid stenosis    a. 1-39% by duplex 2018.   Cervical spondylolysis    Chronic lower back pain    CKD (chronic kidney disease), stage II    COPD (chronic obstructive pulmonary disease) (HCC)    "mild" (12/09/2016)   DDD (degenerative disc disease), lumbar    GERD (gastroesophageal reflux disease)    Hard of hearing    wears hearing aides both ears   Hyperlipidemia    can't take the meds so  takes Fish Oil   Hypertension    Hypothyroidism    IBS (irritable bowel syndrome)    Intermittent dysphagia    for pills and solids   Osteopenia    S/P TAVR (transcatheter aortic valve replacement) 12/07/2016   26 mm Edwards Sapien 3 transcatheter heart valve placed via open left transcarotid approach    Severe aortic stenosis    a. s/p TAVR 11/2016.   Vertigo     Family History  Problem Relation Age of Onset   Cancer - Lung Mother    Heart attack Father    Cancer - Lung Brother    Cancer - Lung Maternal Aunt     Past Surgical History:  Procedure Laterality Date   ABDOMINAL HYSTERECTOMY     APPENDECTOMY     BACK SURGERY  2009   "on her lower back; not sure what they did" (12/09/2016)   BLEPHAROPLASTY Bilateral    CARDIAC CATHETERIZATION     CARDIAC VALVE REPLACEMENT  12/07/2016   Edwards Sapien 3 THV (size 26 mm, model # 9600TFX, serial # [id number]   CATARACT EXTRACTION W/ INTRAOCULAR LENS  IMPLANT, BILATERAL  1999,2000   CATARACT EXTRACTION, BILATERAL Bilateral    COLONOSCOPY W/ BIOPSIES AND POLYPECTOMY  11/2003   Hattie Perch 07/08/2010   EYE SURGERY     HEMORROIDECTOMY     LUMBAR LAMINECTOMY  1999   LUMBAR LAMINECTOMY/DECOMPRESSION MICRODISCECTOMY  09/1999   Hattie Perch 07/08/2010    ORIF HIP FRACTURE Left 05/22/2022   Procedure: OPEN REDUCTION INTERNAL FIXATION HIP;  Surgeon: Cammy Copa, MD;  Location: [facility];  Service: Orthopedics;  Laterality: Left;   POSTERIOR FUSION LUMBAR SPINE  2014   RIGHT/LEFT HEART CATH AND CORONARY ANGIOGRAPHY N/A 11/15/2016   Procedure: RIGHT/LEFT HEART CATH AND CORONARY ANGIOGRAPHY;  Surgeon: Marykay Lex, MD;  Location: [facility] INVASIVE CV LAB;  Service: Cardiovascular;  Laterality: N/A;   SKIN GRAFT SPLIT THICKNESS LEG / FOOT Left 09/2000   Hattie Perch 07/08/2010   TEE WITHOUT CARDIOVERSION N/A 12/07/2016   Procedure: TRANSESOPHAGEAL ECHOCARDIOGRAM (TEE);  Surgeon: Tonny Bollman, MD;  Location: [facility] OR;  Service: Open Heart Surgery;  Laterality: N/A;    TONSILLECTOMY     TOTAL KNEE ARTHROPLASTY Left 04/11/2018   TOTAL KNEE ARTHROPLASTY Left 04/11/2018   Procedure: LEFT TOTAL KNEE ARTHROPLASTY;  Surgeon: Kathryne Hitch, MD;  Location: [facility] OR;  Service: Orthopedics;  Laterality: Left;   TRANSCATHETER AORTIC VALVE REPLACEMENT, TRANSFEMORAL  12/07/2016   Edwards Sapien 3 THV (size 26 mm, model # 9600TFX, serial # [id number]   Social History   Occupational History   Occupation: retired  Tobacco Use   Smoking status: Former    Packs/day: 2.00    Years: 40.00    Additional pack years: 0.00  Total pack years: 80.00    Types: Cigarettes    Quit date: 08/02/1989    Years since quitting: 32.9   Smokeless tobacco: Never  Vaping Use   Vaping Use: Never used  Substance and Sexual Activity   Alcohol use: Yes    Alcohol/week: 14.0 standard drinks of alcohol    Types: 7 Glasses of wine, 7 Standard drinks or equivalent per week   Drug use: No   Sexual activity: Not Currently    Birth control/protection: Surgical

## 2022-07-07 DIAGNOSIS — E785 Hyperlipidemia, unspecified: Secondary | ICD-10-CM | POA: Diagnosis not present

## 2022-07-07 DIAGNOSIS — Z9181 History of falling: Secondary | ICD-10-CM | POA: Diagnosis not present

## 2022-07-07 DIAGNOSIS — E039 Hypothyroidism, unspecified: Secondary | ICD-10-CM | POA: Diagnosis not present

## 2022-07-07 DIAGNOSIS — F32A Depression, unspecified: Secondary | ICD-10-CM | POA: Diagnosis not present

## 2022-07-07 DIAGNOSIS — F0283 Dementia in other diseases classified elsewhere, unspecified severity, with mood disturbance: Secondary | ICD-10-CM | POA: Diagnosis not present

## 2022-07-07 DIAGNOSIS — G4733 Obstructive sleep apnea (adult) (pediatric): Secondary | ICD-10-CM | POA: Diagnosis not present

## 2022-07-07 DIAGNOSIS — K219 Gastro-esophageal reflux disease without esophagitis: Secondary | ICD-10-CM | POA: Diagnosis not present

## 2022-07-07 DIAGNOSIS — N182 Chronic kidney disease, stage 2 (mild): Secondary | ICD-10-CM | POA: Diagnosis not present

## 2022-07-07 DIAGNOSIS — I6529 Occlusion and stenosis of unspecified carotid artery: Secondary | ICD-10-CM | POA: Diagnosis not present

## 2022-07-07 DIAGNOSIS — S72002D Fracture of unspecified part of neck of left femur, subsequent encounter for closed fracture with routine healing: Secondary | ICD-10-CM | POA: Diagnosis not present

## 2022-07-07 DIAGNOSIS — I129 Hypertensive chronic kidney disease with stage 1 through stage 4 chronic kidney disease, or unspecified chronic kidney disease: Secondary | ICD-10-CM | POA: Diagnosis not present

## 2022-07-07 DIAGNOSIS — Z602 Problems related to living alone: Secondary | ICD-10-CM | POA: Diagnosis not present

## 2022-07-07 DIAGNOSIS — Z7982 Long term (current) use of aspirin: Secondary | ICD-10-CM | POA: Diagnosis not present

## 2022-07-07 DIAGNOSIS — J449 Chronic obstructive pulmonary disease, unspecified: Secondary | ICD-10-CM | POA: Diagnosis not present

## 2022-07-07 DIAGNOSIS — I2510 Atherosclerotic heart disease of native coronary artery without angina pectoris: Secondary | ICD-10-CM | POA: Diagnosis not present

## 2022-07-07 DIAGNOSIS — F02818 Dementia in other diseases classified elsewhere, unspecified severity, with other behavioral disturbance: Secondary | ICD-10-CM | POA: Diagnosis not present

## 2022-07-08 DIAGNOSIS — G4733 Obstructive sleep apnea (adult) (pediatric): Secondary | ICD-10-CM | POA: Diagnosis not present

## 2022-07-08 DIAGNOSIS — Z7982 Long term (current) use of aspirin: Secondary | ICD-10-CM | POA: Diagnosis not present

## 2022-07-08 DIAGNOSIS — I129 Hypertensive chronic kidney disease with stage 1 through stage 4 chronic kidney disease, or unspecified chronic kidney disease: Secondary | ICD-10-CM | POA: Diagnosis not present

## 2022-07-08 DIAGNOSIS — K219 Gastro-esophageal reflux disease without esophagitis: Secondary | ICD-10-CM | POA: Diagnosis not present

## 2022-07-08 DIAGNOSIS — F0283 Dementia in other diseases classified elsewhere, unspecified severity, with mood disturbance: Secondary | ICD-10-CM | POA: Diagnosis not present

## 2022-07-08 DIAGNOSIS — Z9181 History of falling: Secondary | ICD-10-CM | POA: Diagnosis not present

## 2022-07-08 DIAGNOSIS — F02818 Dementia in other diseases classified elsewhere, unspecified severity, with other behavioral disturbance: Secondary | ICD-10-CM | POA: Diagnosis not present

## 2022-07-08 DIAGNOSIS — I2510 Atherosclerotic heart disease of native coronary artery without angina pectoris: Secondary | ICD-10-CM | POA: Diagnosis not present

## 2022-07-08 DIAGNOSIS — J449 Chronic obstructive pulmonary disease, unspecified: Secondary | ICD-10-CM | POA: Diagnosis not present

## 2022-07-08 DIAGNOSIS — E039 Hypothyroidism, unspecified: Secondary | ICD-10-CM | POA: Diagnosis not present

## 2022-07-08 DIAGNOSIS — Z602 Problems related to living alone: Secondary | ICD-10-CM | POA: Diagnosis not present

## 2022-07-08 DIAGNOSIS — I6529 Occlusion and stenosis of unspecified carotid artery: Secondary | ICD-10-CM | POA: Diagnosis not present

## 2022-07-08 DIAGNOSIS — F32A Depression, unspecified: Secondary | ICD-10-CM | POA: Diagnosis not present

## 2022-07-08 DIAGNOSIS — N182 Chronic kidney disease, stage 2 (mild): Secondary | ICD-10-CM | POA: Diagnosis not present

## 2022-07-08 DIAGNOSIS — E785 Hyperlipidemia, unspecified: Secondary | ICD-10-CM | POA: Diagnosis not present

## 2022-07-08 DIAGNOSIS — S72002D Fracture of unspecified part of neck of left femur, subsequent encounter for closed fracture with routine healing: Secondary | ICD-10-CM | POA: Diagnosis not present

## 2022-07-09 DIAGNOSIS — L8990 Pressure ulcer of unspecified site, unspecified stage: Secondary | ICD-10-CM | POA: Diagnosis not present

## 2022-07-09 DIAGNOSIS — Z8781 Personal history of (healed) traumatic fracture: Secondary | ICD-10-CM | POA: Diagnosis not present

## 2022-07-12 DIAGNOSIS — N182 Chronic kidney disease, stage 2 (mild): Secondary | ICD-10-CM | POA: Diagnosis not present

## 2022-07-12 DIAGNOSIS — F02818 Dementia in other diseases classified elsewhere, unspecified severity, with other behavioral disturbance: Secondary | ICD-10-CM | POA: Diagnosis not present

## 2022-07-12 DIAGNOSIS — Z602 Problems related to living alone: Secondary | ICD-10-CM | POA: Diagnosis not present

## 2022-07-12 DIAGNOSIS — G4733 Obstructive sleep apnea (adult) (pediatric): Secondary | ICD-10-CM | POA: Diagnosis not present

## 2022-07-12 DIAGNOSIS — I129 Hypertensive chronic kidney disease with stage 1 through stage 4 chronic kidney disease, or unspecified chronic kidney disease: Secondary | ICD-10-CM | POA: Diagnosis not present

## 2022-07-12 DIAGNOSIS — I2510 Atherosclerotic heart disease of native coronary artery without angina pectoris: Secondary | ICD-10-CM | POA: Diagnosis not present

## 2022-07-12 DIAGNOSIS — F0283 Dementia in other diseases classified elsewhere, unspecified severity, with mood disturbance: Secondary | ICD-10-CM | POA: Diagnosis not present

## 2022-07-12 DIAGNOSIS — F32A Depression, unspecified: Secondary | ICD-10-CM | POA: Diagnosis not present

## 2022-07-12 DIAGNOSIS — I6529 Occlusion and stenosis of unspecified carotid artery: Secondary | ICD-10-CM | POA: Diagnosis not present

## 2022-07-12 DIAGNOSIS — Z9181 History of falling: Secondary | ICD-10-CM | POA: Diagnosis not present

## 2022-07-12 DIAGNOSIS — Z7982 Long term (current) use of aspirin: Secondary | ICD-10-CM | POA: Diagnosis not present

## 2022-07-12 DIAGNOSIS — K219 Gastro-esophageal reflux disease without esophagitis: Secondary | ICD-10-CM | POA: Diagnosis not present

## 2022-07-12 DIAGNOSIS — E785 Hyperlipidemia, unspecified: Secondary | ICD-10-CM | POA: Diagnosis not present

## 2022-07-12 DIAGNOSIS — E039 Hypothyroidism, unspecified: Secondary | ICD-10-CM | POA: Diagnosis not present

## 2022-07-12 DIAGNOSIS — S72002D Fracture of unspecified part of neck of left femur, subsequent encounter for closed fracture with routine healing: Secondary | ICD-10-CM | POA: Diagnosis not present

## 2022-07-12 DIAGNOSIS — J449 Chronic obstructive pulmonary disease, unspecified: Secondary | ICD-10-CM | POA: Diagnosis not present

## 2022-07-13 DIAGNOSIS — L738 Other specified follicular disorders: Secondary | ICD-10-CM | POA: Diagnosis not present

## 2022-07-13 DIAGNOSIS — L821 Other seborrheic keratosis: Secondary | ICD-10-CM | POA: Diagnosis not present

## 2022-07-13 DIAGNOSIS — L728 Other follicular cysts of the skin and subcutaneous tissue: Secondary | ICD-10-CM | POA: Diagnosis not present

## 2022-07-13 DIAGNOSIS — L570 Actinic keratosis: Secondary | ICD-10-CM | POA: Diagnosis not present

## 2022-07-14 DIAGNOSIS — Z9181 History of falling: Secondary | ICD-10-CM | POA: Diagnosis not present

## 2022-07-14 DIAGNOSIS — N182 Chronic kidney disease, stage 2 (mild): Secondary | ICD-10-CM | POA: Diagnosis not present

## 2022-07-14 DIAGNOSIS — E785 Hyperlipidemia, unspecified: Secondary | ICD-10-CM | POA: Diagnosis not present

## 2022-07-14 DIAGNOSIS — I2510 Atherosclerotic heart disease of native coronary artery without angina pectoris: Secondary | ICD-10-CM | POA: Diagnosis not present

## 2022-07-14 DIAGNOSIS — Z602 Problems related to living alone: Secondary | ICD-10-CM | POA: Diagnosis not present

## 2022-07-14 DIAGNOSIS — K219 Gastro-esophageal reflux disease without esophagitis: Secondary | ICD-10-CM | POA: Diagnosis not present

## 2022-07-14 DIAGNOSIS — Z7982 Long term (current) use of aspirin: Secondary | ICD-10-CM | POA: Diagnosis not present

## 2022-07-14 DIAGNOSIS — J449 Chronic obstructive pulmonary disease, unspecified: Secondary | ICD-10-CM | POA: Diagnosis not present

## 2022-07-14 DIAGNOSIS — G4733 Obstructive sleep apnea (adult) (pediatric): Secondary | ICD-10-CM | POA: Diagnosis not present

## 2022-07-14 DIAGNOSIS — F0283 Dementia in other diseases classified elsewhere, unspecified severity, with mood disturbance: Secondary | ICD-10-CM | POA: Diagnosis not present

## 2022-07-14 DIAGNOSIS — E039 Hypothyroidism, unspecified: Secondary | ICD-10-CM | POA: Diagnosis not present

## 2022-07-14 DIAGNOSIS — F02818 Dementia in other diseases classified elsewhere, unspecified severity, with other behavioral disturbance: Secondary | ICD-10-CM | POA: Diagnosis not present

## 2022-07-14 DIAGNOSIS — I129 Hypertensive chronic kidney disease with stage 1 through stage 4 chronic kidney disease, or unspecified chronic kidney disease: Secondary | ICD-10-CM | POA: Diagnosis not present

## 2022-07-14 DIAGNOSIS — S72002D Fracture of unspecified part of neck of left femur, subsequent encounter for closed fracture with routine healing: Secondary | ICD-10-CM | POA: Diagnosis not present

## 2022-07-14 DIAGNOSIS — F32A Depression, unspecified: Secondary | ICD-10-CM | POA: Diagnosis not present

## 2022-07-14 DIAGNOSIS — I6529 Occlusion and stenosis of unspecified carotid artery: Secondary | ICD-10-CM | POA: Diagnosis not present

## 2022-07-21 DIAGNOSIS — J449 Chronic obstructive pulmonary disease, unspecified: Secondary | ICD-10-CM | POA: Diagnosis not present

## 2022-07-21 DIAGNOSIS — S72002D Fracture of unspecified part of neck of left femur, subsequent encounter for closed fracture with routine healing: Secondary | ICD-10-CM | POA: Diagnosis not present

## 2022-07-21 DIAGNOSIS — F02818 Dementia in other diseases classified elsewhere, unspecified severity, with other behavioral disturbance: Secondary | ICD-10-CM | POA: Diagnosis not present

## 2022-07-21 DIAGNOSIS — F32A Depression, unspecified: Secondary | ICD-10-CM | POA: Diagnosis not present

## 2022-07-21 DIAGNOSIS — E039 Hypothyroidism, unspecified: Secondary | ICD-10-CM | POA: Diagnosis not present

## 2022-07-21 DIAGNOSIS — K219 Gastro-esophageal reflux disease without esophagitis: Secondary | ICD-10-CM | POA: Diagnosis not present

## 2022-07-21 DIAGNOSIS — N182 Chronic kidney disease, stage 2 (mild): Secondary | ICD-10-CM | POA: Diagnosis not present

## 2022-07-21 DIAGNOSIS — I6529 Occlusion and stenosis of unspecified carotid artery: Secondary | ICD-10-CM | POA: Diagnosis not present

## 2022-07-21 DIAGNOSIS — Z7982 Long term (current) use of aspirin: Secondary | ICD-10-CM | POA: Diagnosis not present

## 2022-07-21 DIAGNOSIS — Z9181 History of falling: Secondary | ICD-10-CM | POA: Diagnosis not present

## 2022-07-21 DIAGNOSIS — I2510 Atherosclerotic heart disease of native coronary artery without angina pectoris: Secondary | ICD-10-CM | POA: Diagnosis not present

## 2022-07-21 DIAGNOSIS — F0283 Dementia in other diseases classified elsewhere, unspecified severity, with mood disturbance: Secondary | ICD-10-CM | POA: Diagnosis not present

## 2022-07-21 DIAGNOSIS — Z602 Problems related to living alone: Secondary | ICD-10-CM | POA: Diagnosis not present

## 2022-07-21 DIAGNOSIS — I129 Hypertensive chronic kidney disease with stage 1 through stage 4 chronic kidney disease, or unspecified chronic kidney disease: Secondary | ICD-10-CM | POA: Diagnosis not present

## 2022-07-21 DIAGNOSIS — G4733 Obstructive sleep apnea (adult) (pediatric): Secondary | ICD-10-CM | POA: Diagnosis not present

## 2022-07-21 DIAGNOSIS — E785 Hyperlipidemia, unspecified: Secondary | ICD-10-CM | POA: Diagnosis not present

## 2022-07-22 DIAGNOSIS — F32A Depression, unspecified: Secondary | ICD-10-CM | POA: Diagnosis not present

## 2022-07-22 DIAGNOSIS — J449 Chronic obstructive pulmonary disease, unspecified: Secondary | ICD-10-CM | POA: Diagnosis not present

## 2022-07-22 DIAGNOSIS — I2510 Atherosclerotic heart disease of native coronary artery without angina pectoris: Secondary | ICD-10-CM | POA: Diagnosis not present

## 2022-07-22 DIAGNOSIS — K219 Gastro-esophageal reflux disease without esophagitis: Secondary | ICD-10-CM | POA: Diagnosis not present

## 2022-07-22 DIAGNOSIS — I129 Hypertensive chronic kidney disease with stage 1 through stage 4 chronic kidney disease, or unspecified chronic kidney disease: Secondary | ICD-10-CM | POA: Diagnosis not present

## 2022-07-22 DIAGNOSIS — F0283 Dementia in other diseases classified elsewhere, unspecified severity, with mood disturbance: Secondary | ICD-10-CM | POA: Diagnosis not present

## 2022-07-22 DIAGNOSIS — E039 Hypothyroidism, unspecified: Secondary | ICD-10-CM | POA: Diagnosis not present

## 2022-07-22 DIAGNOSIS — I6529 Occlusion and stenosis of unspecified carotid artery: Secondary | ICD-10-CM | POA: Diagnosis not present

## 2022-07-22 DIAGNOSIS — E785 Hyperlipidemia, unspecified: Secondary | ICD-10-CM | POA: Diagnosis not present

## 2022-07-22 DIAGNOSIS — F02818 Dementia in other diseases classified elsewhere, unspecified severity, with other behavioral disturbance: Secondary | ICD-10-CM | POA: Diagnosis not present

## 2022-07-22 DIAGNOSIS — Z9181 History of falling: Secondary | ICD-10-CM | POA: Diagnosis not present

## 2022-07-22 DIAGNOSIS — S72002D Fracture of unspecified part of neck of left femur, subsequent encounter for closed fracture with routine healing: Secondary | ICD-10-CM | POA: Diagnosis not present

## 2022-07-22 DIAGNOSIS — Z602 Problems related to living alone: Secondary | ICD-10-CM | POA: Diagnosis not present

## 2022-07-22 DIAGNOSIS — Z7982 Long term (current) use of aspirin: Secondary | ICD-10-CM | POA: Diagnosis not present

## 2022-07-22 DIAGNOSIS — G4733 Obstructive sleep apnea (adult) (pediatric): Secondary | ICD-10-CM | POA: Diagnosis not present

## 2022-07-22 DIAGNOSIS — N182 Chronic kidney disease, stage 2 (mild): Secondary | ICD-10-CM | POA: Diagnosis not present

## 2022-07-26 DIAGNOSIS — G8929 Other chronic pain: Secondary | ICD-10-CM | POA: Diagnosis not present

## 2022-07-26 DIAGNOSIS — H9190 Unspecified hearing loss, unspecified ear: Secondary | ICD-10-CM | POA: Diagnosis not present

## 2022-07-26 DIAGNOSIS — R1310 Dysphagia, unspecified: Secondary | ICD-10-CM | POA: Diagnosis not present

## 2022-07-26 DIAGNOSIS — N182 Chronic kidney disease, stage 2 (mild): Secondary | ICD-10-CM | POA: Diagnosis not present

## 2022-07-26 DIAGNOSIS — I2510 Atherosclerotic heart disease of native coronary artery without angina pectoris: Secondary | ICD-10-CM | POA: Diagnosis not present

## 2022-07-26 DIAGNOSIS — Z9181 History of falling: Secondary | ICD-10-CM | POA: Diagnosis not present

## 2022-07-26 DIAGNOSIS — I129 Hypertensive chronic kidney disease with stage 1 through stage 4 chronic kidney disease, or unspecified chronic kidney disease: Secondary | ICD-10-CM | POA: Diagnosis not present

## 2022-07-26 DIAGNOSIS — K219 Gastro-esophageal reflux disease without esophagitis: Secondary | ICD-10-CM | POA: Diagnosis not present

## 2022-07-26 DIAGNOSIS — K589 Irritable bowel syndrome without diarrhea: Secondary | ICD-10-CM | POA: Diagnosis not present

## 2022-07-26 DIAGNOSIS — I6529 Occlusion and stenosis of unspecified carotid artery: Secondary | ICD-10-CM | POA: Diagnosis not present

## 2022-07-26 DIAGNOSIS — M8580 Other specified disorders of bone density and structure, unspecified site: Secondary | ICD-10-CM | POA: Diagnosis not present

## 2022-07-26 DIAGNOSIS — Z954 Presence of other heart-valve replacement: Secondary | ICD-10-CM | POA: Diagnosis not present

## 2022-07-26 DIAGNOSIS — Z7982 Long term (current) use of aspirin: Secondary | ICD-10-CM | POA: Diagnosis not present

## 2022-07-26 DIAGNOSIS — E039 Hypothyroidism, unspecified: Secondary | ICD-10-CM | POA: Diagnosis not present

## 2022-07-26 DIAGNOSIS — M5136 Other intervertebral disc degeneration, lumbar region: Secondary | ICD-10-CM | POA: Diagnosis not present

## 2022-07-26 DIAGNOSIS — F02818 Dementia in other diseases classified elsewhere, unspecified severity, with other behavioral disturbance: Secondary | ICD-10-CM | POA: Diagnosis not present

## 2022-07-26 DIAGNOSIS — M4302 Spondylolysis, cervical region: Secondary | ICD-10-CM | POA: Diagnosis not present

## 2022-07-26 DIAGNOSIS — Z602 Problems related to living alone: Secondary | ICD-10-CM | POA: Diagnosis not present

## 2022-07-26 DIAGNOSIS — F32A Depression, unspecified: Secondary | ICD-10-CM | POA: Diagnosis not present

## 2022-07-26 DIAGNOSIS — G4733 Obstructive sleep apnea (adult) (pediatric): Secondary | ICD-10-CM | POA: Diagnosis not present

## 2022-07-26 DIAGNOSIS — M545 Low back pain, unspecified: Secondary | ICD-10-CM | POA: Diagnosis not present

## 2022-07-26 DIAGNOSIS — J449 Chronic obstructive pulmonary disease, unspecified: Secondary | ICD-10-CM | POA: Diagnosis not present

## 2022-07-26 DIAGNOSIS — E785 Hyperlipidemia, unspecified: Secondary | ICD-10-CM | POA: Diagnosis not present

## 2022-07-26 DIAGNOSIS — F0283 Dementia in other diseases classified elsewhere, unspecified severity, with mood disturbance: Secondary | ICD-10-CM | POA: Diagnosis not present

## 2022-07-26 DIAGNOSIS — S72002D Fracture of unspecified part of neck of left femur, subsequent encounter for closed fracture with routine healing: Secondary | ICD-10-CM | POA: Diagnosis not present

## 2022-07-29 DIAGNOSIS — Z602 Problems related to living alone: Secondary | ICD-10-CM | POA: Diagnosis not present

## 2022-07-29 DIAGNOSIS — J449 Chronic obstructive pulmonary disease, unspecified: Secondary | ICD-10-CM | POA: Diagnosis not present

## 2022-07-29 DIAGNOSIS — G4733 Obstructive sleep apnea (adult) (pediatric): Secondary | ICD-10-CM | POA: Diagnosis not present

## 2022-07-29 DIAGNOSIS — E785 Hyperlipidemia, unspecified: Secondary | ICD-10-CM | POA: Diagnosis not present

## 2022-07-29 DIAGNOSIS — I129 Hypertensive chronic kidney disease with stage 1 through stage 4 chronic kidney disease, or unspecified chronic kidney disease: Secondary | ICD-10-CM | POA: Diagnosis not present

## 2022-07-29 DIAGNOSIS — F0283 Dementia in other diseases classified elsewhere, unspecified severity, with mood disturbance: Secondary | ICD-10-CM | POA: Diagnosis not present

## 2022-07-29 DIAGNOSIS — N182 Chronic kidney disease, stage 2 (mild): Secondary | ICD-10-CM | POA: Diagnosis not present

## 2022-07-29 DIAGNOSIS — Z7982 Long term (current) use of aspirin: Secondary | ICD-10-CM | POA: Diagnosis not present

## 2022-07-29 DIAGNOSIS — I6529 Occlusion and stenosis of unspecified carotid artery: Secondary | ICD-10-CM | POA: Diagnosis not present

## 2022-07-29 DIAGNOSIS — E039 Hypothyroidism, unspecified: Secondary | ICD-10-CM | POA: Diagnosis not present

## 2022-07-29 DIAGNOSIS — F32A Depression, unspecified: Secondary | ICD-10-CM | POA: Diagnosis not present

## 2022-07-29 DIAGNOSIS — F02818 Dementia in other diseases classified elsewhere, unspecified severity, with other behavioral disturbance: Secondary | ICD-10-CM | POA: Diagnosis not present

## 2022-07-29 DIAGNOSIS — S72002D Fracture of unspecified part of neck of left femur, subsequent encounter for closed fracture with routine healing: Secondary | ICD-10-CM | POA: Diagnosis not present

## 2022-07-29 DIAGNOSIS — K219 Gastro-esophageal reflux disease without esophagitis: Secondary | ICD-10-CM | POA: Diagnosis not present

## 2022-07-29 DIAGNOSIS — Z9181 History of falling: Secondary | ICD-10-CM | POA: Diagnosis not present

## 2022-07-29 DIAGNOSIS — I2510 Atherosclerotic heart disease of native coronary artery without angina pectoris: Secondary | ICD-10-CM | POA: Diagnosis not present

## 2022-07-30 DIAGNOSIS — F330 Major depressive disorder, recurrent, mild: Secondary | ICD-10-CM | POA: Diagnosis not present

## 2022-08-02 DIAGNOSIS — G4733 Obstructive sleep apnea (adult) (pediatric): Secondary | ICD-10-CM | POA: Diagnosis not present

## 2022-08-02 DIAGNOSIS — Z602 Problems related to living alone: Secondary | ICD-10-CM | POA: Diagnosis not present

## 2022-08-02 DIAGNOSIS — I6529 Occlusion and stenosis of unspecified carotid artery: Secondary | ICD-10-CM | POA: Diagnosis not present

## 2022-08-02 DIAGNOSIS — E039 Hypothyroidism, unspecified: Secondary | ICD-10-CM | POA: Diagnosis not present

## 2022-08-02 DIAGNOSIS — F02818 Dementia in other diseases classified elsewhere, unspecified severity, with other behavioral disturbance: Secondary | ICD-10-CM | POA: Diagnosis not present

## 2022-08-02 DIAGNOSIS — I2510 Atherosclerotic heart disease of native coronary artery without angina pectoris: Secondary | ICD-10-CM | POA: Diagnosis not present

## 2022-08-02 DIAGNOSIS — Z9181 History of falling: Secondary | ICD-10-CM | POA: Diagnosis not present

## 2022-08-02 DIAGNOSIS — Z7982 Long term (current) use of aspirin: Secondary | ICD-10-CM | POA: Diagnosis not present

## 2022-08-02 DIAGNOSIS — I129 Hypertensive chronic kidney disease with stage 1 through stage 4 chronic kidney disease, or unspecified chronic kidney disease: Secondary | ICD-10-CM | POA: Diagnosis not present

## 2022-08-02 DIAGNOSIS — S72002D Fracture of unspecified part of neck of left femur, subsequent encounter for closed fracture with routine healing: Secondary | ICD-10-CM | POA: Diagnosis not present

## 2022-08-02 DIAGNOSIS — N182 Chronic kidney disease, stage 2 (mild): Secondary | ICD-10-CM | POA: Diagnosis not present

## 2022-08-02 DIAGNOSIS — F0283 Dementia in other diseases classified elsewhere, unspecified severity, with mood disturbance: Secondary | ICD-10-CM | POA: Diagnosis not present

## 2022-08-02 DIAGNOSIS — K219 Gastro-esophageal reflux disease without esophagitis: Secondary | ICD-10-CM | POA: Diagnosis not present

## 2022-08-02 DIAGNOSIS — J449 Chronic obstructive pulmonary disease, unspecified: Secondary | ICD-10-CM | POA: Diagnosis not present

## 2022-08-02 DIAGNOSIS — F32A Depression, unspecified: Secondary | ICD-10-CM | POA: Diagnosis not present

## 2022-08-02 DIAGNOSIS — E785 Hyperlipidemia, unspecified: Secondary | ICD-10-CM | POA: Diagnosis not present

## 2022-08-04 DIAGNOSIS — J449 Chronic obstructive pulmonary disease, unspecified: Secondary | ICD-10-CM | POA: Diagnosis not present

## 2022-08-04 DIAGNOSIS — Z602 Problems related to living alone: Secondary | ICD-10-CM | POA: Diagnosis not present

## 2022-08-04 DIAGNOSIS — Z7982 Long term (current) use of aspirin: Secondary | ICD-10-CM | POA: Diagnosis not present

## 2022-08-04 DIAGNOSIS — G4733 Obstructive sleep apnea (adult) (pediatric): Secondary | ICD-10-CM | POA: Diagnosis not present

## 2022-08-04 DIAGNOSIS — I2510 Atherosclerotic heart disease of native coronary artery without angina pectoris: Secondary | ICD-10-CM | POA: Diagnosis not present

## 2022-08-04 DIAGNOSIS — N182 Chronic kidney disease, stage 2 (mild): Secondary | ICD-10-CM | POA: Diagnosis not present

## 2022-08-04 DIAGNOSIS — E039 Hypothyroidism, unspecified: Secondary | ICD-10-CM | POA: Diagnosis not present

## 2022-08-04 DIAGNOSIS — I129 Hypertensive chronic kidney disease with stage 1 through stage 4 chronic kidney disease, or unspecified chronic kidney disease: Secondary | ICD-10-CM | POA: Diagnosis not present

## 2022-08-04 DIAGNOSIS — S72002D Fracture of unspecified part of neck of left femur, subsequent encounter for closed fracture with routine healing: Secondary | ICD-10-CM | POA: Diagnosis not present

## 2022-08-04 DIAGNOSIS — K219 Gastro-esophageal reflux disease without esophagitis: Secondary | ICD-10-CM | POA: Diagnosis not present

## 2022-08-04 DIAGNOSIS — F32A Depression, unspecified: Secondary | ICD-10-CM | POA: Diagnosis not present

## 2022-08-04 DIAGNOSIS — F02818 Dementia in other diseases classified elsewhere, unspecified severity, with other behavioral disturbance: Secondary | ICD-10-CM | POA: Diagnosis not present

## 2022-08-04 DIAGNOSIS — F0283 Dementia in other diseases classified elsewhere, unspecified severity, with mood disturbance: Secondary | ICD-10-CM | POA: Diagnosis not present

## 2022-08-04 DIAGNOSIS — Z9181 History of falling: Secondary | ICD-10-CM | POA: Diagnosis not present

## 2022-08-04 DIAGNOSIS — I6529 Occlusion and stenosis of unspecified carotid artery: Secondary | ICD-10-CM | POA: Diagnosis not present

## 2022-08-04 DIAGNOSIS — E785 Hyperlipidemia, unspecified: Secondary | ICD-10-CM | POA: Diagnosis not present

## 2022-10-07 DIAGNOSIS — C44722 Squamous cell carcinoma of skin of right lower limb, including hip: Secondary | ICD-10-CM | POA: Diagnosis not present

## 2022-12-01 DIAGNOSIS — F419 Anxiety disorder, unspecified: Secondary | ICD-10-CM | POA: Diagnosis not present

## 2022-12-01 DIAGNOSIS — I700 Atherosclerosis of aorta: Secondary | ICD-10-CM | POA: Diagnosis not present

## 2022-12-01 DIAGNOSIS — J449 Chronic obstructive pulmonary disease, unspecified: Secondary | ICD-10-CM | POA: Diagnosis not present

## 2022-12-01 DIAGNOSIS — N183 Chronic kidney disease, stage 3 unspecified: Secondary | ICD-10-CM | POA: Diagnosis not present

## 2022-12-01 DIAGNOSIS — G63 Polyneuropathy in diseases classified elsewhere: Secondary | ICD-10-CM | POA: Diagnosis not present

## 2022-12-21 DIAGNOSIS — I129 Hypertensive chronic kidney disease with stage 1 through stage 4 chronic kidney disease, or unspecified chronic kidney disease: Secondary | ICD-10-CM | POA: Diagnosis not present

## 2022-12-21 DIAGNOSIS — R3915 Urgency of urination: Secondary | ICD-10-CM | POA: Diagnosis not present

## 2022-12-21 DIAGNOSIS — E039 Hypothyroidism, unspecified: Secondary | ICD-10-CM | POA: Diagnosis not present

## 2022-12-21 DIAGNOSIS — N183 Chronic kidney disease, stage 3 unspecified: Secondary | ICD-10-CM | POA: Diagnosis not present

## 2023-01-24 DIAGNOSIS — C44722 Squamous cell carcinoma of skin of right lower limb, including hip: Secondary | ICD-10-CM | POA: Diagnosis not present

## 2023-01-24 DIAGNOSIS — R233 Spontaneous ecchymoses: Secondary | ICD-10-CM | POA: Diagnosis not present

## 2023-02-21 DIAGNOSIS — R29898 Other symptoms and signs involving the musculoskeletal system: Secondary | ICD-10-CM | POA: Diagnosis not present

## 2023-02-21 DIAGNOSIS — G629 Polyneuropathy, unspecified: Secondary | ICD-10-CM | POA: Diagnosis not present

## 2023-02-27 DIAGNOSIS — M150 Primary generalized (osteo)arthritis: Secondary | ICD-10-CM | POA: Diagnosis not present

## 2023-02-27 DIAGNOSIS — Z96652 Presence of left artificial knee joint: Secondary | ICD-10-CM | POA: Diagnosis not present

## 2023-02-27 DIAGNOSIS — M47812 Spondylosis without myelopathy or radiculopathy, cervical region: Secondary | ICD-10-CM | POA: Diagnosis not present

## 2023-02-27 DIAGNOSIS — E441 Mild protein-calorie malnutrition: Secondary | ICD-10-CM | POA: Diagnosis not present

## 2023-02-27 DIAGNOSIS — N183 Chronic kidney disease, stage 3 unspecified: Secondary | ICD-10-CM | POA: Diagnosis not present

## 2023-02-27 DIAGNOSIS — E7800 Pure hypercholesterolemia, unspecified: Secondary | ICD-10-CM | POA: Diagnosis not present

## 2023-02-27 DIAGNOSIS — E039 Hypothyroidism, unspecified: Secondary | ICD-10-CM | POA: Diagnosis not present

## 2023-02-27 DIAGNOSIS — H9190 Unspecified hearing loss, unspecified ear: Secondary | ICD-10-CM | POA: Diagnosis not present

## 2023-02-27 DIAGNOSIS — Z87891 Personal history of nicotine dependence: Secondary | ICD-10-CM | POA: Diagnosis not present

## 2023-02-27 DIAGNOSIS — E559 Vitamin D deficiency, unspecified: Secondary | ICD-10-CM | POA: Diagnosis not present

## 2023-02-27 DIAGNOSIS — F419 Anxiety disorder, unspecified: Secondary | ICD-10-CM | POA: Diagnosis not present

## 2023-02-27 DIAGNOSIS — K589 Irritable bowel syndrome without diarrhea: Secondary | ICD-10-CM | POA: Diagnosis not present

## 2023-02-27 DIAGNOSIS — M8580 Other specified disorders of bone density and structure, unspecified site: Secondary | ICD-10-CM | POA: Diagnosis not present

## 2023-02-27 DIAGNOSIS — I493 Ventricular premature depolarization: Secondary | ICD-10-CM | POA: Diagnosis not present

## 2023-02-27 DIAGNOSIS — K449 Diaphragmatic hernia without obstruction or gangrene: Secondary | ICD-10-CM | POA: Diagnosis not present

## 2023-02-27 DIAGNOSIS — M51369 Other intervertebral disc degeneration, lumbar region without mention of lumbar back pain or lower extremity pain: Secondary | ICD-10-CM | POA: Diagnosis not present

## 2023-02-27 DIAGNOSIS — I1310 Hypertensive heart and chronic kidney disease without heart failure, with stage 1 through stage 4 chronic kidney disease, or unspecified chronic kidney disease: Secondary | ICD-10-CM | POA: Diagnosis not present

## 2023-02-27 DIAGNOSIS — J449 Chronic obstructive pulmonary disease, unspecified: Secondary | ICD-10-CM | POA: Diagnosis not present

## 2023-02-27 DIAGNOSIS — H04129 Dry eye syndrome of unspecified lacrimal gland: Secondary | ICD-10-CM | POA: Diagnosis not present

## 2023-02-27 DIAGNOSIS — G629 Polyneuropathy, unspecified: Secondary | ICD-10-CM | POA: Diagnosis not present

## 2023-02-27 DIAGNOSIS — K219 Gastro-esophageal reflux disease without esophagitis: Secondary | ICD-10-CM | POA: Diagnosis not present

## 2023-02-27 DIAGNOSIS — I700 Atherosclerosis of aorta: Secondary | ICD-10-CM | POA: Diagnosis not present

## 2023-02-27 DIAGNOSIS — Z8673 Personal history of transient ischemic attack (TIA), and cerebral infarction without residual deficits: Secondary | ICD-10-CM | POA: Diagnosis not present

## 2023-02-27 DIAGNOSIS — Z556 Problems related to health literacy: Secondary | ICD-10-CM | POA: Diagnosis not present

## 2023-02-27 DIAGNOSIS — Z85828 Personal history of other malignant neoplasm of skin: Secondary | ICD-10-CM | POA: Diagnosis not present

## 2023-03-01 DIAGNOSIS — I700 Atherosclerosis of aorta: Secondary | ICD-10-CM | POA: Diagnosis not present

## 2023-03-01 DIAGNOSIS — M47812 Spondylosis without myelopathy or radiculopathy, cervical region: Secondary | ICD-10-CM | POA: Diagnosis not present

## 2023-03-01 DIAGNOSIS — E441 Mild protein-calorie malnutrition: Secondary | ICD-10-CM | POA: Diagnosis not present

## 2023-03-01 DIAGNOSIS — I493 Ventricular premature depolarization: Secondary | ICD-10-CM | POA: Diagnosis not present

## 2023-03-01 DIAGNOSIS — M150 Primary generalized (osteo)arthritis: Secondary | ICD-10-CM | POA: Diagnosis not present

## 2023-03-01 DIAGNOSIS — M8580 Other specified disorders of bone density and structure, unspecified site: Secondary | ICD-10-CM | POA: Diagnosis not present

## 2023-03-01 DIAGNOSIS — I1310 Hypertensive heart and chronic kidney disease without heart failure, with stage 1 through stage 4 chronic kidney disease, or unspecified chronic kidney disease: Secondary | ICD-10-CM | POA: Diagnosis not present

## 2023-03-01 DIAGNOSIS — G629 Polyneuropathy, unspecified: Secondary | ICD-10-CM | POA: Diagnosis not present

## 2023-03-01 DIAGNOSIS — J449 Chronic obstructive pulmonary disease, unspecified: Secondary | ICD-10-CM | POA: Diagnosis not present

## 2023-03-01 DIAGNOSIS — K219 Gastro-esophageal reflux disease without esophagitis: Secondary | ICD-10-CM | POA: Diagnosis not present

## 2023-03-01 DIAGNOSIS — N183 Chronic kidney disease, stage 3 unspecified: Secondary | ICD-10-CM | POA: Diagnosis not present

## 2023-03-01 DIAGNOSIS — E039 Hypothyroidism, unspecified: Secondary | ICD-10-CM | POA: Diagnosis not present

## 2023-03-01 DIAGNOSIS — F419 Anxiety disorder, unspecified: Secondary | ICD-10-CM | POA: Diagnosis not present

## 2023-03-01 DIAGNOSIS — M51369 Other intervertebral disc degeneration, lumbar region without mention of lumbar back pain or lower extremity pain: Secondary | ICD-10-CM | POA: Diagnosis not present

## 2023-03-01 DIAGNOSIS — K589 Irritable bowel syndrome without diarrhea: Secondary | ICD-10-CM | POA: Diagnosis not present

## 2023-03-01 DIAGNOSIS — K449 Diaphragmatic hernia without obstruction or gangrene: Secondary | ICD-10-CM | POA: Diagnosis not present

## 2023-03-07 DIAGNOSIS — K449 Diaphragmatic hernia without obstruction or gangrene: Secondary | ICD-10-CM | POA: Diagnosis not present

## 2023-03-07 DIAGNOSIS — N183 Chronic kidney disease, stage 3 unspecified: Secondary | ICD-10-CM | POA: Diagnosis not present

## 2023-03-07 DIAGNOSIS — E039 Hypothyroidism, unspecified: Secondary | ICD-10-CM | POA: Diagnosis not present

## 2023-03-07 DIAGNOSIS — K589 Irritable bowel syndrome without diarrhea: Secondary | ICD-10-CM | POA: Diagnosis not present

## 2023-03-07 DIAGNOSIS — E441 Mild protein-calorie malnutrition: Secondary | ICD-10-CM | POA: Diagnosis not present

## 2023-03-07 DIAGNOSIS — F419 Anxiety disorder, unspecified: Secondary | ICD-10-CM | POA: Diagnosis not present

## 2023-03-07 DIAGNOSIS — K219 Gastro-esophageal reflux disease without esophagitis: Secondary | ICD-10-CM | POA: Diagnosis not present

## 2023-03-07 DIAGNOSIS — I1310 Hypertensive heart and chronic kidney disease without heart failure, with stage 1 through stage 4 chronic kidney disease, or unspecified chronic kidney disease: Secondary | ICD-10-CM | POA: Diagnosis not present

## 2023-03-07 DIAGNOSIS — J449 Chronic obstructive pulmonary disease, unspecified: Secondary | ICD-10-CM | POA: Diagnosis not present

## 2023-03-07 DIAGNOSIS — G629 Polyneuropathy, unspecified: Secondary | ICD-10-CM | POA: Diagnosis not present

## 2023-03-07 DIAGNOSIS — I493 Ventricular premature depolarization: Secondary | ICD-10-CM | POA: Diagnosis not present

## 2023-03-07 DIAGNOSIS — I700 Atherosclerosis of aorta: Secondary | ICD-10-CM | POA: Diagnosis not present

## 2023-03-07 DIAGNOSIS — M51369 Other intervertebral disc degeneration, lumbar region without mention of lumbar back pain or lower extremity pain: Secondary | ICD-10-CM | POA: Diagnosis not present

## 2023-03-07 DIAGNOSIS — M47812 Spondylosis without myelopathy or radiculopathy, cervical region: Secondary | ICD-10-CM | POA: Diagnosis not present

## 2023-03-07 DIAGNOSIS — M150 Primary generalized (osteo)arthritis: Secondary | ICD-10-CM | POA: Diagnosis not present

## 2023-03-07 DIAGNOSIS — M8580 Other specified disorders of bone density and structure, unspecified site: Secondary | ICD-10-CM | POA: Diagnosis not present

## 2023-03-10 DIAGNOSIS — G629 Polyneuropathy, unspecified: Secondary | ICD-10-CM | POA: Diagnosis not present

## 2023-03-10 DIAGNOSIS — K449 Diaphragmatic hernia without obstruction or gangrene: Secondary | ICD-10-CM | POA: Diagnosis not present

## 2023-03-10 DIAGNOSIS — K219 Gastro-esophageal reflux disease without esophagitis: Secondary | ICD-10-CM | POA: Diagnosis not present

## 2023-03-10 DIAGNOSIS — M51369 Other intervertebral disc degeneration, lumbar region without mention of lumbar back pain or lower extremity pain: Secondary | ICD-10-CM | POA: Diagnosis not present

## 2023-03-10 DIAGNOSIS — M8580 Other specified disorders of bone density and structure, unspecified site: Secondary | ICD-10-CM | POA: Diagnosis not present

## 2023-03-10 DIAGNOSIS — N183 Chronic kidney disease, stage 3 unspecified: Secondary | ICD-10-CM | POA: Diagnosis not present

## 2023-03-10 DIAGNOSIS — I493 Ventricular premature depolarization: Secondary | ICD-10-CM | POA: Diagnosis not present

## 2023-03-10 DIAGNOSIS — F419 Anxiety disorder, unspecified: Secondary | ICD-10-CM | POA: Diagnosis not present

## 2023-03-10 DIAGNOSIS — E039 Hypothyroidism, unspecified: Secondary | ICD-10-CM | POA: Diagnosis not present

## 2023-03-10 DIAGNOSIS — I700 Atherosclerosis of aorta: Secondary | ICD-10-CM | POA: Diagnosis not present

## 2023-03-10 DIAGNOSIS — E441 Mild protein-calorie malnutrition: Secondary | ICD-10-CM | POA: Diagnosis not present

## 2023-03-10 DIAGNOSIS — M47812 Spondylosis without myelopathy or radiculopathy, cervical region: Secondary | ICD-10-CM | POA: Diagnosis not present

## 2023-03-10 DIAGNOSIS — J449 Chronic obstructive pulmonary disease, unspecified: Secondary | ICD-10-CM | POA: Diagnosis not present

## 2023-03-10 DIAGNOSIS — K589 Irritable bowel syndrome without diarrhea: Secondary | ICD-10-CM | POA: Diagnosis not present

## 2023-03-10 DIAGNOSIS — M150 Primary generalized (osteo)arthritis: Secondary | ICD-10-CM | POA: Diagnosis not present

## 2023-03-10 DIAGNOSIS — I1310 Hypertensive heart and chronic kidney disease without heart failure, with stage 1 through stage 4 chronic kidney disease, or unspecified chronic kidney disease: Secondary | ICD-10-CM | POA: Diagnosis not present

## 2023-03-14 DIAGNOSIS — G629 Polyneuropathy, unspecified: Secondary | ICD-10-CM | POA: Diagnosis not present

## 2023-03-14 DIAGNOSIS — N183 Chronic kidney disease, stage 3 unspecified: Secondary | ICD-10-CM | POA: Diagnosis not present

## 2023-03-14 DIAGNOSIS — M150 Primary generalized (osteo)arthritis: Secondary | ICD-10-CM | POA: Diagnosis not present

## 2023-03-14 DIAGNOSIS — M47812 Spondylosis without myelopathy or radiculopathy, cervical region: Secondary | ICD-10-CM | POA: Diagnosis not present

## 2023-03-14 DIAGNOSIS — J449 Chronic obstructive pulmonary disease, unspecified: Secondary | ICD-10-CM | POA: Diagnosis not present

## 2023-03-14 DIAGNOSIS — K219 Gastro-esophageal reflux disease without esophagitis: Secondary | ICD-10-CM | POA: Diagnosis not present

## 2023-03-14 DIAGNOSIS — I493 Ventricular premature depolarization: Secondary | ICD-10-CM | POA: Diagnosis not present

## 2023-03-14 DIAGNOSIS — M51369 Other intervertebral disc degeneration, lumbar region without mention of lumbar back pain or lower extremity pain: Secondary | ICD-10-CM | POA: Diagnosis not present

## 2023-03-14 DIAGNOSIS — I700 Atherosclerosis of aorta: Secondary | ICD-10-CM | POA: Diagnosis not present

## 2023-03-14 DIAGNOSIS — F419 Anxiety disorder, unspecified: Secondary | ICD-10-CM | POA: Diagnosis not present

## 2023-03-14 DIAGNOSIS — M8580 Other specified disorders of bone density and structure, unspecified site: Secondary | ICD-10-CM | POA: Diagnosis not present

## 2023-03-14 DIAGNOSIS — K589 Irritable bowel syndrome without diarrhea: Secondary | ICD-10-CM | POA: Diagnosis not present

## 2023-03-14 DIAGNOSIS — K449 Diaphragmatic hernia without obstruction or gangrene: Secondary | ICD-10-CM | POA: Diagnosis not present

## 2023-03-14 DIAGNOSIS — E441 Mild protein-calorie malnutrition: Secondary | ICD-10-CM | POA: Diagnosis not present

## 2023-03-14 DIAGNOSIS — E039 Hypothyroidism, unspecified: Secondary | ICD-10-CM | POA: Diagnosis not present

## 2023-03-14 DIAGNOSIS — I1310 Hypertensive heart and chronic kidney disease without heart failure, with stage 1 through stage 4 chronic kidney disease, or unspecified chronic kidney disease: Secondary | ICD-10-CM | POA: Diagnosis not present

## 2023-03-21 DIAGNOSIS — K219 Gastro-esophageal reflux disease without esophagitis: Secondary | ICD-10-CM | POA: Diagnosis not present

## 2023-03-21 DIAGNOSIS — I493 Ventricular premature depolarization: Secondary | ICD-10-CM | POA: Diagnosis not present

## 2023-03-21 DIAGNOSIS — M8580 Other specified disorders of bone density and structure, unspecified site: Secondary | ICD-10-CM | POA: Diagnosis not present

## 2023-03-21 DIAGNOSIS — K449 Diaphragmatic hernia without obstruction or gangrene: Secondary | ICD-10-CM | POA: Diagnosis not present

## 2023-03-21 DIAGNOSIS — M47812 Spondylosis without myelopathy or radiculopathy, cervical region: Secondary | ICD-10-CM | POA: Diagnosis not present

## 2023-03-21 DIAGNOSIS — F419 Anxiety disorder, unspecified: Secondary | ICD-10-CM | POA: Diagnosis not present

## 2023-03-21 DIAGNOSIS — G629 Polyneuropathy, unspecified: Secondary | ICD-10-CM | POA: Diagnosis not present

## 2023-03-21 DIAGNOSIS — M51369 Other intervertebral disc degeneration, lumbar region without mention of lumbar back pain or lower extremity pain: Secondary | ICD-10-CM | POA: Diagnosis not present

## 2023-03-21 DIAGNOSIS — I700 Atherosclerosis of aorta: Secondary | ICD-10-CM | POA: Diagnosis not present

## 2023-03-21 DIAGNOSIS — I1310 Hypertensive heart and chronic kidney disease without heart failure, with stage 1 through stage 4 chronic kidney disease, or unspecified chronic kidney disease: Secondary | ICD-10-CM | POA: Diagnosis not present

## 2023-03-21 DIAGNOSIS — M150 Primary generalized (osteo)arthritis: Secondary | ICD-10-CM | POA: Diagnosis not present

## 2023-03-21 DIAGNOSIS — J449 Chronic obstructive pulmonary disease, unspecified: Secondary | ICD-10-CM | POA: Diagnosis not present

## 2023-03-21 DIAGNOSIS — K589 Irritable bowel syndrome without diarrhea: Secondary | ICD-10-CM | POA: Diagnosis not present

## 2023-03-21 DIAGNOSIS — N183 Chronic kidney disease, stage 3 unspecified: Secondary | ICD-10-CM | POA: Diagnosis not present

## 2023-03-21 DIAGNOSIS — E441 Mild protein-calorie malnutrition: Secondary | ICD-10-CM | POA: Diagnosis not present

## 2023-03-21 DIAGNOSIS — E039 Hypothyroidism, unspecified: Secondary | ICD-10-CM | POA: Diagnosis not present

## 2023-03-22 DIAGNOSIS — F419 Anxiety disorder, unspecified: Secondary | ICD-10-CM | POA: Diagnosis not present

## 2023-03-22 DIAGNOSIS — J449 Chronic obstructive pulmonary disease, unspecified: Secondary | ICD-10-CM | POA: Diagnosis not present

## 2023-03-22 DIAGNOSIS — M8580 Other specified disorders of bone density and structure, unspecified site: Secondary | ICD-10-CM | POA: Diagnosis not present

## 2023-03-22 DIAGNOSIS — K449 Diaphragmatic hernia without obstruction or gangrene: Secondary | ICD-10-CM | POA: Diagnosis not present

## 2023-03-22 DIAGNOSIS — K219 Gastro-esophageal reflux disease without esophagitis: Secondary | ICD-10-CM | POA: Diagnosis not present

## 2023-03-22 DIAGNOSIS — E441 Mild protein-calorie malnutrition: Secondary | ICD-10-CM | POA: Diagnosis not present

## 2023-03-22 DIAGNOSIS — E039 Hypothyroidism, unspecified: Secondary | ICD-10-CM | POA: Diagnosis not present

## 2023-03-22 DIAGNOSIS — M51369 Other intervertebral disc degeneration, lumbar region without mention of lumbar back pain or lower extremity pain: Secondary | ICD-10-CM | POA: Diagnosis not present

## 2023-03-22 DIAGNOSIS — G629 Polyneuropathy, unspecified: Secondary | ICD-10-CM | POA: Diagnosis not present

## 2023-03-22 DIAGNOSIS — K589 Irritable bowel syndrome without diarrhea: Secondary | ICD-10-CM | POA: Diagnosis not present

## 2023-03-22 DIAGNOSIS — M47812 Spondylosis without myelopathy or radiculopathy, cervical region: Secondary | ICD-10-CM | POA: Diagnosis not present

## 2023-03-22 DIAGNOSIS — N183 Chronic kidney disease, stage 3 unspecified: Secondary | ICD-10-CM | POA: Diagnosis not present

## 2023-03-22 DIAGNOSIS — M150 Primary generalized (osteo)arthritis: Secondary | ICD-10-CM | POA: Diagnosis not present

## 2023-03-22 DIAGNOSIS — I700 Atherosclerosis of aorta: Secondary | ICD-10-CM | POA: Diagnosis not present

## 2023-03-22 DIAGNOSIS — I1310 Hypertensive heart and chronic kidney disease without heart failure, with stage 1 through stage 4 chronic kidney disease, or unspecified chronic kidney disease: Secondary | ICD-10-CM | POA: Diagnosis not present

## 2023-03-22 DIAGNOSIS — I493 Ventricular premature depolarization: Secondary | ICD-10-CM | POA: Diagnosis not present

## 2023-03-24 DIAGNOSIS — I493 Ventricular premature depolarization: Secondary | ICD-10-CM | POA: Diagnosis not present

## 2023-03-24 DIAGNOSIS — M8580 Other specified disorders of bone density and structure, unspecified site: Secondary | ICD-10-CM | POA: Diagnosis not present

## 2023-03-24 DIAGNOSIS — F419 Anxiety disorder, unspecified: Secondary | ICD-10-CM | POA: Diagnosis not present

## 2023-03-24 DIAGNOSIS — K449 Diaphragmatic hernia without obstruction or gangrene: Secondary | ICD-10-CM | POA: Diagnosis not present

## 2023-03-24 DIAGNOSIS — K589 Irritable bowel syndrome without diarrhea: Secondary | ICD-10-CM | POA: Diagnosis not present

## 2023-03-24 DIAGNOSIS — J449 Chronic obstructive pulmonary disease, unspecified: Secondary | ICD-10-CM | POA: Diagnosis not present

## 2023-03-24 DIAGNOSIS — G629 Polyneuropathy, unspecified: Secondary | ICD-10-CM | POA: Diagnosis not present

## 2023-03-24 DIAGNOSIS — M150 Primary generalized (osteo)arthritis: Secondary | ICD-10-CM | POA: Diagnosis not present

## 2023-03-24 DIAGNOSIS — M47812 Spondylosis without myelopathy or radiculopathy, cervical region: Secondary | ICD-10-CM | POA: Diagnosis not present

## 2023-03-24 DIAGNOSIS — N183 Chronic kidney disease, stage 3 unspecified: Secondary | ICD-10-CM | POA: Diagnosis not present

## 2023-03-24 DIAGNOSIS — I1310 Hypertensive heart and chronic kidney disease without heart failure, with stage 1 through stage 4 chronic kidney disease, or unspecified chronic kidney disease: Secondary | ICD-10-CM | POA: Diagnosis not present

## 2023-03-24 DIAGNOSIS — E039 Hypothyroidism, unspecified: Secondary | ICD-10-CM | POA: Diagnosis not present

## 2023-03-24 DIAGNOSIS — I700 Atherosclerosis of aorta: Secondary | ICD-10-CM | POA: Diagnosis not present

## 2023-03-24 DIAGNOSIS — E441 Mild protein-calorie malnutrition: Secondary | ICD-10-CM | POA: Diagnosis not present

## 2023-03-24 DIAGNOSIS — K219 Gastro-esophageal reflux disease without esophagitis: Secondary | ICD-10-CM | POA: Diagnosis not present

## 2023-03-24 DIAGNOSIS — M51369 Other intervertebral disc degeneration, lumbar region without mention of lumbar back pain or lower extremity pain: Secondary | ICD-10-CM | POA: Diagnosis not present

## 2023-03-29 DIAGNOSIS — M150 Primary generalized (osteo)arthritis: Secondary | ICD-10-CM | POA: Diagnosis not present

## 2023-03-29 DIAGNOSIS — E039 Hypothyroidism, unspecified: Secondary | ICD-10-CM | POA: Diagnosis not present

## 2023-03-29 DIAGNOSIS — M51369 Other intervertebral disc degeneration, lumbar region without mention of lumbar back pain or lower extremity pain: Secondary | ICD-10-CM | POA: Diagnosis not present

## 2023-03-29 DIAGNOSIS — I1310 Hypertensive heart and chronic kidney disease without heart failure, with stage 1 through stage 4 chronic kidney disease, or unspecified chronic kidney disease: Secondary | ICD-10-CM | POA: Diagnosis not present

## 2023-03-29 DIAGNOSIS — M8580 Other specified disorders of bone density and structure, unspecified site: Secondary | ICD-10-CM | POA: Diagnosis not present

## 2023-03-29 DIAGNOSIS — J449 Chronic obstructive pulmonary disease, unspecified: Secondary | ICD-10-CM | POA: Diagnosis not present

## 2023-03-29 DIAGNOSIS — N183 Chronic kidney disease, stage 3 unspecified: Secondary | ICD-10-CM | POA: Diagnosis not present

## 2023-03-29 DIAGNOSIS — K449 Diaphragmatic hernia without obstruction or gangrene: Secondary | ICD-10-CM | POA: Diagnosis not present

## 2023-03-29 DIAGNOSIS — E441 Mild protein-calorie malnutrition: Secondary | ICD-10-CM | POA: Diagnosis not present

## 2023-03-29 DIAGNOSIS — K589 Irritable bowel syndrome without diarrhea: Secondary | ICD-10-CM | POA: Diagnosis not present

## 2023-03-29 DIAGNOSIS — E7800 Pure hypercholesterolemia, unspecified: Secondary | ICD-10-CM | POA: Diagnosis not present

## 2023-03-29 DIAGNOSIS — K219 Gastro-esophageal reflux disease without esophagitis: Secondary | ICD-10-CM | POA: Diagnosis not present

## 2023-03-29 DIAGNOSIS — I493 Ventricular premature depolarization: Secondary | ICD-10-CM | POA: Diagnosis not present

## 2023-03-29 DIAGNOSIS — F419 Anxiety disorder, unspecified: Secondary | ICD-10-CM | POA: Diagnosis not present

## 2023-03-29 DIAGNOSIS — Z8673 Personal history of transient ischemic attack (TIA), and cerebral infarction without residual deficits: Secondary | ICD-10-CM | POA: Diagnosis not present

## 2023-03-29 DIAGNOSIS — Z96652 Presence of left artificial knee joint: Secondary | ICD-10-CM | POA: Diagnosis not present

## 2023-03-29 DIAGNOSIS — M47812 Spondylosis without myelopathy or radiculopathy, cervical region: Secondary | ICD-10-CM | POA: Diagnosis not present

## 2023-03-29 DIAGNOSIS — E559 Vitamin D deficiency, unspecified: Secondary | ICD-10-CM | POA: Diagnosis not present

## 2023-03-29 DIAGNOSIS — G629 Polyneuropathy, unspecified: Secondary | ICD-10-CM | POA: Diagnosis not present

## 2023-03-29 DIAGNOSIS — G4730 Sleep apnea, unspecified: Secondary | ICD-10-CM | POA: Diagnosis not present

## 2023-03-29 DIAGNOSIS — Z4800 Encounter for change or removal of nonsurgical wound dressing: Secondary | ICD-10-CM | POA: Diagnosis not present

## 2023-03-29 DIAGNOSIS — H9190 Unspecified hearing loss, unspecified ear: Secondary | ICD-10-CM | POA: Diagnosis not present

## 2023-03-29 DIAGNOSIS — L988 Other specified disorders of the skin and subcutaneous tissue: Secondary | ICD-10-CM | POA: Diagnosis not present

## 2023-03-29 DIAGNOSIS — I700 Atherosclerosis of aorta: Secondary | ICD-10-CM | POA: Diagnosis not present

## 2023-03-29 DIAGNOSIS — H04129 Dry eye syndrome of unspecified lacrimal gland: Secondary | ICD-10-CM | POA: Diagnosis not present

## 2023-04-01 DIAGNOSIS — K589 Irritable bowel syndrome without diarrhea: Secondary | ICD-10-CM | POA: Diagnosis not present

## 2023-04-01 DIAGNOSIS — M47812 Spondylosis without myelopathy or radiculopathy, cervical region: Secondary | ICD-10-CM | POA: Diagnosis not present

## 2023-04-01 DIAGNOSIS — K219 Gastro-esophageal reflux disease without esophagitis: Secondary | ICD-10-CM | POA: Diagnosis not present

## 2023-04-01 DIAGNOSIS — E441 Mild protein-calorie malnutrition: Secondary | ICD-10-CM | POA: Diagnosis not present

## 2023-04-01 DIAGNOSIS — I1310 Hypertensive heart and chronic kidney disease without heart failure, with stage 1 through stage 4 chronic kidney disease, or unspecified chronic kidney disease: Secondary | ICD-10-CM | POA: Diagnosis not present

## 2023-04-01 DIAGNOSIS — Z4800 Encounter for change or removal of nonsurgical wound dressing: Secondary | ICD-10-CM | POA: Diagnosis not present

## 2023-04-01 DIAGNOSIS — M150 Primary generalized (osteo)arthritis: Secondary | ICD-10-CM | POA: Diagnosis not present

## 2023-04-01 DIAGNOSIS — I700 Atherosclerosis of aorta: Secondary | ICD-10-CM | POA: Diagnosis not present

## 2023-04-01 DIAGNOSIS — I493 Ventricular premature depolarization: Secondary | ICD-10-CM | POA: Diagnosis not present

## 2023-04-01 DIAGNOSIS — E039 Hypothyroidism, unspecified: Secondary | ICD-10-CM | POA: Diagnosis not present

## 2023-04-01 DIAGNOSIS — K449 Diaphragmatic hernia without obstruction or gangrene: Secondary | ICD-10-CM | POA: Diagnosis not present

## 2023-04-01 DIAGNOSIS — N183 Chronic kidney disease, stage 3 unspecified: Secondary | ICD-10-CM | POA: Diagnosis not present

## 2023-04-01 DIAGNOSIS — J449 Chronic obstructive pulmonary disease, unspecified: Secondary | ICD-10-CM | POA: Diagnosis not present

## 2023-04-01 DIAGNOSIS — G629 Polyneuropathy, unspecified: Secondary | ICD-10-CM | POA: Diagnosis not present

## 2023-04-01 DIAGNOSIS — M51369 Other intervertebral disc degeneration, lumbar region without mention of lumbar back pain or lower extremity pain: Secondary | ICD-10-CM | POA: Diagnosis not present

## 2023-04-01 DIAGNOSIS — L988 Other specified disorders of the skin and subcutaneous tissue: Secondary | ICD-10-CM | POA: Diagnosis not present

## 2023-04-02 DIAGNOSIS — L988 Other specified disorders of the skin and subcutaneous tissue: Secondary | ICD-10-CM | POA: Diagnosis not present

## 2023-04-02 DIAGNOSIS — K589 Irritable bowel syndrome without diarrhea: Secondary | ICD-10-CM | POA: Diagnosis not present

## 2023-04-02 DIAGNOSIS — N183 Chronic kidney disease, stage 3 unspecified: Secondary | ICD-10-CM | POA: Diagnosis not present

## 2023-04-02 DIAGNOSIS — K449 Diaphragmatic hernia without obstruction or gangrene: Secondary | ICD-10-CM | POA: Diagnosis not present

## 2023-04-02 DIAGNOSIS — E039 Hypothyroidism, unspecified: Secondary | ICD-10-CM | POA: Diagnosis not present

## 2023-04-02 DIAGNOSIS — Z4800 Encounter for change or removal of nonsurgical wound dressing: Secondary | ICD-10-CM | POA: Diagnosis not present

## 2023-04-02 DIAGNOSIS — M51369 Other intervertebral disc degeneration, lumbar region without mention of lumbar back pain or lower extremity pain: Secondary | ICD-10-CM | POA: Diagnosis not present

## 2023-04-02 DIAGNOSIS — I1310 Hypertensive heart and chronic kidney disease without heart failure, with stage 1 through stage 4 chronic kidney disease, or unspecified chronic kidney disease: Secondary | ICD-10-CM | POA: Diagnosis not present

## 2023-04-02 DIAGNOSIS — M150 Primary generalized (osteo)arthritis: Secondary | ICD-10-CM | POA: Diagnosis not present

## 2023-04-02 DIAGNOSIS — I700 Atherosclerosis of aorta: Secondary | ICD-10-CM | POA: Diagnosis not present

## 2023-04-02 DIAGNOSIS — I493 Ventricular premature depolarization: Secondary | ICD-10-CM | POA: Diagnosis not present

## 2023-04-02 DIAGNOSIS — M47812 Spondylosis without myelopathy or radiculopathy, cervical region: Secondary | ICD-10-CM | POA: Diagnosis not present

## 2023-04-02 DIAGNOSIS — K219 Gastro-esophageal reflux disease without esophagitis: Secondary | ICD-10-CM | POA: Diagnosis not present

## 2023-04-02 DIAGNOSIS — G629 Polyneuropathy, unspecified: Secondary | ICD-10-CM | POA: Diagnosis not present

## 2023-04-02 DIAGNOSIS — J449 Chronic obstructive pulmonary disease, unspecified: Secondary | ICD-10-CM | POA: Diagnosis not present

## 2023-04-02 DIAGNOSIS — E441 Mild protein-calorie malnutrition: Secondary | ICD-10-CM | POA: Diagnosis not present

## 2023-04-05 DIAGNOSIS — L988 Other specified disorders of the skin and subcutaneous tissue: Secondary | ICD-10-CM | POA: Diagnosis not present

## 2023-04-05 DIAGNOSIS — I1310 Hypertensive heart and chronic kidney disease without heart failure, with stage 1 through stage 4 chronic kidney disease, or unspecified chronic kidney disease: Secondary | ICD-10-CM | POA: Diagnosis not present

## 2023-04-05 DIAGNOSIS — I700 Atherosclerosis of aorta: Secondary | ICD-10-CM | POA: Diagnosis not present

## 2023-04-05 DIAGNOSIS — G629 Polyneuropathy, unspecified: Secondary | ICD-10-CM | POA: Diagnosis not present

## 2023-04-05 DIAGNOSIS — J449 Chronic obstructive pulmonary disease, unspecified: Secondary | ICD-10-CM | POA: Diagnosis not present

## 2023-04-05 DIAGNOSIS — K589 Irritable bowel syndrome without diarrhea: Secondary | ICD-10-CM | POA: Diagnosis not present

## 2023-04-05 DIAGNOSIS — N183 Chronic kidney disease, stage 3 unspecified: Secondary | ICD-10-CM | POA: Diagnosis not present

## 2023-04-05 DIAGNOSIS — E039 Hypothyroidism, unspecified: Secondary | ICD-10-CM | POA: Diagnosis not present

## 2023-04-05 DIAGNOSIS — E441 Mild protein-calorie malnutrition: Secondary | ICD-10-CM | POA: Diagnosis not present

## 2023-04-05 DIAGNOSIS — M47812 Spondylosis without myelopathy or radiculopathy, cervical region: Secondary | ICD-10-CM | POA: Diagnosis not present

## 2023-04-05 DIAGNOSIS — I493 Ventricular premature depolarization: Secondary | ICD-10-CM | POA: Diagnosis not present

## 2023-04-05 DIAGNOSIS — M51369 Other intervertebral disc degeneration, lumbar region without mention of lumbar back pain or lower extremity pain: Secondary | ICD-10-CM | POA: Diagnosis not present

## 2023-04-05 DIAGNOSIS — M150 Primary generalized (osteo)arthritis: Secondary | ICD-10-CM | POA: Diagnosis not present

## 2023-04-05 DIAGNOSIS — K219 Gastro-esophageal reflux disease without esophagitis: Secondary | ICD-10-CM | POA: Diagnosis not present

## 2023-04-05 DIAGNOSIS — Z4800 Encounter for change or removal of nonsurgical wound dressing: Secondary | ICD-10-CM | POA: Diagnosis not present

## 2023-04-05 DIAGNOSIS — K449 Diaphragmatic hernia without obstruction or gangrene: Secondary | ICD-10-CM | POA: Diagnosis not present

## 2023-04-07 DIAGNOSIS — Z4800 Encounter for change or removal of nonsurgical wound dressing: Secondary | ICD-10-CM | POA: Diagnosis not present

## 2023-04-07 DIAGNOSIS — M150 Primary generalized (osteo)arthritis: Secondary | ICD-10-CM | POA: Diagnosis not present

## 2023-04-07 DIAGNOSIS — I700 Atherosclerosis of aorta: Secondary | ICD-10-CM | POA: Diagnosis not present

## 2023-04-07 DIAGNOSIS — M51369 Other intervertebral disc degeneration, lumbar region without mention of lumbar back pain or lower extremity pain: Secondary | ICD-10-CM | POA: Diagnosis not present

## 2023-04-07 DIAGNOSIS — N183 Chronic kidney disease, stage 3 unspecified: Secondary | ICD-10-CM | POA: Diagnosis not present

## 2023-04-07 DIAGNOSIS — I1310 Hypertensive heart and chronic kidney disease without heart failure, with stage 1 through stage 4 chronic kidney disease, or unspecified chronic kidney disease: Secondary | ICD-10-CM | POA: Diagnosis not present

## 2023-04-07 DIAGNOSIS — L988 Other specified disorders of the skin and subcutaneous tissue: Secondary | ICD-10-CM | POA: Diagnosis not present

## 2023-04-07 DIAGNOSIS — K449 Diaphragmatic hernia without obstruction or gangrene: Secondary | ICD-10-CM | POA: Diagnosis not present

## 2023-04-07 DIAGNOSIS — K589 Irritable bowel syndrome without diarrhea: Secondary | ICD-10-CM | POA: Diagnosis not present

## 2023-04-07 DIAGNOSIS — J449 Chronic obstructive pulmonary disease, unspecified: Secondary | ICD-10-CM | POA: Diagnosis not present

## 2023-04-07 DIAGNOSIS — G629 Polyneuropathy, unspecified: Secondary | ICD-10-CM | POA: Diagnosis not present

## 2023-04-07 DIAGNOSIS — M47812 Spondylosis without myelopathy or radiculopathy, cervical region: Secondary | ICD-10-CM | POA: Diagnosis not present

## 2023-04-07 DIAGNOSIS — K219 Gastro-esophageal reflux disease without esophagitis: Secondary | ICD-10-CM | POA: Diagnosis not present

## 2023-04-07 DIAGNOSIS — E039 Hypothyroidism, unspecified: Secondary | ICD-10-CM | POA: Diagnosis not present

## 2023-04-07 DIAGNOSIS — I493 Ventricular premature depolarization: Secondary | ICD-10-CM | POA: Diagnosis not present

## 2023-04-07 DIAGNOSIS — E441 Mild protein-calorie malnutrition: Secondary | ICD-10-CM | POA: Diagnosis not present

## 2023-04-11 DIAGNOSIS — J449 Chronic obstructive pulmonary disease, unspecified: Secondary | ICD-10-CM | POA: Diagnosis not present

## 2023-04-11 DIAGNOSIS — I493 Ventricular premature depolarization: Secondary | ICD-10-CM | POA: Diagnosis not present

## 2023-04-11 DIAGNOSIS — L988 Other specified disorders of the skin and subcutaneous tissue: Secondary | ICD-10-CM | POA: Diagnosis not present

## 2023-04-11 DIAGNOSIS — N183 Chronic kidney disease, stage 3 unspecified: Secondary | ICD-10-CM | POA: Diagnosis not present

## 2023-04-11 DIAGNOSIS — M51369 Other intervertebral disc degeneration, lumbar region without mention of lumbar back pain or lower extremity pain: Secondary | ICD-10-CM | POA: Diagnosis not present

## 2023-04-11 DIAGNOSIS — E039 Hypothyroidism, unspecified: Secondary | ICD-10-CM | POA: Diagnosis not present

## 2023-04-11 DIAGNOSIS — K219 Gastro-esophageal reflux disease without esophagitis: Secondary | ICD-10-CM | POA: Diagnosis not present

## 2023-04-11 DIAGNOSIS — G629 Polyneuropathy, unspecified: Secondary | ICD-10-CM | POA: Diagnosis not present

## 2023-04-11 DIAGNOSIS — K589 Irritable bowel syndrome without diarrhea: Secondary | ICD-10-CM | POA: Diagnosis not present

## 2023-04-11 DIAGNOSIS — M47812 Spondylosis without myelopathy or radiculopathy, cervical region: Secondary | ICD-10-CM | POA: Diagnosis not present

## 2023-04-11 DIAGNOSIS — K449 Diaphragmatic hernia without obstruction or gangrene: Secondary | ICD-10-CM | POA: Diagnosis not present

## 2023-04-11 DIAGNOSIS — E441 Mild protein-calorie malnutrition: Secondary | ICD-10-CM | POA: Diagnosis not present

## 2023-04-11 DIAGNOSIS — Z4800 Encounter for change or removal of nonsurgical wound dressing: Secondary | ICD-10-CM | POA: Diagnosis not present

## 2023-04-11 DIAGNOSIS — I700 Atherosclerosis of aorta: Secondary | ICD-10-CM | POA: Diagnosis not present

## 2023-04-11 DIAGNOSIS — I1310 Hypertensive heart and chronic kidney disease without heart failure, with stage 1 through stage 4 chronic kidney disease, or unspecified chronic kidney disease: Secondary | ICD-10-CM | POA: Diagnosis not present

## 2023-04-11 DIAGNOSIS — M150 Primary generalized (osteo)arthritis: Secondary | ICD-10-CM | POA: Diagnosis not present

## 2023-04-12 DIAGNOSIS — E039 Hypothyroidism, unspecified: Secondary | ICD-10-CM | POA: Diagnosis not present

## 2023-04-12 DIAGNOSIS — Z4800 Encounter for change or removal of nonsurgical wound dressing: Secondary | ICD-10-CM | POA: Diagnosis not present

## 2023-04-12 DIAGNOSIS — M47812 Spondylosis without myelopathy or radiculopathy, cervical region: Secondary | ICD-10-CM | POA: Diagnosis not present

## 2023-04-12 DIAGNOSIS — N183 Chronic kidney disease, stage 3 unspecified: Secondary | ICD-10-CM | POA: Diagnosis not present

## 2023-04-12 DIAGNOSIS — J449 Chronic obstructive pulmonary disease, unspecified: Secondary | ICD-10-CM | POA: Diagnosis not present

## 2023-04-12 DIAGNOSIS — L988 Other specified disorders of the skin and subcutaneous tissue: Secondary | ICD-10-CM | POA: Diagnosis not present

## 2023-04-12 DIAGNOSIS — I493 Ventricular premature depolarization: Secondary | ICD-10-CM | POA: Diagnosis not present

## 2023-04-12 DIAGNOSIS — K589 Irritable bowel syndrome without diarrhea: Secondary | ICD-10-CM | POA: Diagnosis not present

## 2023-04-12 DIAGNOSIS — K219 Gastro-esophageal reflux disease without esophagitis: Secondary | ICD-10-CM | POA: Diagnosis not present

## 2023-04-12 DIAGNOSIS — E441 Mild protein-calorie malnutrition: Secondary | ICD-10-CM | POA: Diagnosis not present

## 2023-04-12 DIAGNOSIS — G629 Polyneuropathy, unspecified: Secondary | ICD-10-CM | POA: Diagnosis not present

## 2023-04-12 DIAGNOSIS — M51369 Other intervertebral disc degeneration, lumbar region without mention of lumbar back pain or lower extremity pain: Secondary | ICD-10-CM | POA: Diagnosis not present

## 2023-04-12 DIAGNOSIS — I700 Atherosclerosis of aorta: Secondary | ICD-10-CM | POA: Diagnosis not present

## 2023-04-12 DIAGNOSIS — I1310 Hypertensive heart and chronic kidney disease without heart failure, with stage 1 through stage 4 chronic kidney disease, or unspecified chronic kidney disease: Secondary | ICD-10-CM | POA: Diagnosis not present

## 2023-04-12 DIAGNOSIS — K449 Diaphragmatic hernia without obstruction or gangrene: Secondary | ICD-10-CM | POA: Diagnosis not present

## 2023-04-12 DIAGNOSIS — M150 Primary generalized (osteo)arthritis: Secondary | ICD-10-CM | POA: Diagnosis not present

## 2023-04-20 DIAGNOSIS — K589 Irritable bowel syndrome without diarrhea: Secondary | ICD-10-CM | POA: Diagnosis not present

## 2023-04-20 DIAGNOSIS — M150 Primary generalized (osteo)arthritis: Secondary | ICD-10-CM | POA: Diagnosis not present

## 2023-04-20 DIAGNOSIS — I1310 Hypertensive heart and chronic kidney disease without heart failure, with stage 1 through stage 4 chronic kidney disease, or unspecified chronic kidney disease: Secondary | ICD-10-CM | POA: Diagnosis not present

## 2023-04-20 DIAGNOSIS — I700 Atherosclerosis of aorta: Secondary | ICD-10-CM | POA: Diagnosis not present

## 2023-04-20 DIAGNOSIS — L988 Other specified disorders of the skin and subcutaneous tissue: Secondary | ICD-10-CM | POA: Diagnosis not present

## 2023-04-20 DIAGNOSIS — R350 Frequency of micturition: Secondary | ICD-10-CM | POA: Diagnosis not present

## 2023-04-20 DIAGNOSIS — I493 Ventricular premature depolarization: Secondary | ICD-10-CM | POA: Diagnosis not present

## 2023-04-20 DIAGNOSIS — E039 Hypothyroidism, unspecified: Secondary | ICD-10-CM | POA: Diagnosis not present

## 2023-04-20 DIAGNOSIS — M51369 Other intervertebral disc degeneration, lumbar region without mention of lumbar back pain or lower extremity pain: Secondary | ICD-10-CM | POA: Diagnosis not present

## 2023-04-20 DIAGNOSIS — G629 Polyneuropathy, unspecified: Secondary | ICD-10-CM | POA: Diagnosis not present

## 2023-04-20 DIAGNOSIS — E441 Mild protein-calorie malnutrition: Secondary | ICD-10-CM | POA: Diagnosis not present

## 2023-04-20 DIAGNOSIS — M47812 Spondylosis without myelopathy or radiculopathy, cervical region: Secondary | ICD-10-CM | POA: Diagnosis not present

## 2023-04-20 DIAGNOSIS — K219 Gastro-esophageal reflux disease without esophagitis: Secondary | ICD-10-CM | POA: Diagnosis not present

## 2023-04-20 DIAGNOSIS — Z4800 Encounter for change or removal of nonsurgical wound dressing: Secondary | ICD-10-CM | POA: Diagnosis not present

## 2023-04-20 DIAGNOSIS — N183 Chronic kidney disease, stage 3 unspecified: Secondary | ICD-10-CM | POA: Diagnosis not present

## 2023-04-20 DIAGNOSIS — J449 Chronic obstructive pulmonary disease, unspecified: Secondary | ICD-10-CM | POA: Diagnosis not present

## 2023-04-20 DIAGNOSIS — K449 Diaphragmatic hernia without obstruction or gangrene: Secondary | ICD-10-CM | POA: Diagnosis not present

## 2023-04-21 DIAGNOSIS — M47812 Spondylosis without myelopathy or radiculopathy, cervical region: Secondary | ICD-10-CM | POA: Diagnosis not present

## 2023-04-21 DIAGNOSIS — M51369 Other intervertebral disc degeneration, lumbar region without mention of lumbar back pain or lower extremity pain: Secondary | ICD-10-CM | POA: Diagnosis not present

## 2023-04-21 DIAGNOSIS — E039 Hypothyroidism, unspecified: Secondary | ICD-10-CM | POA: Diagnosis not present

## 2023-04-21 DIAGNOSIS — K589 Irritable bowel syndrome without diarrhea: Secondary | ICD-10-CM | POA: Diagnosis not present

## 2023-04-21 DIAGNOSIS — I493 Ventricular premature depolarization: Secondary | ICD-10-CM | POA: Diagnosis not present

## 2023-04-21 DIAGNOSIS — G629 Polyneuropathy, unspecified: Secondary | ICD-10-CM | POA: Diagnosis not present

## 2023-04-21 DIAGNOSIS — K449 Diaphragmatic hernia without obstruction or gangrene: Secondary | ICD-10-CM | POA: Diagnosis not present

## 2023-04-21 DIAGNOSIS — I700 Atherosclerosis of aorta: Secondary | ICD-10-CM | POA: Diagnosis not present

## 2023-04-21 DIAGNOSIS — M150 Primary generalized (osteo)arthritis: Secondary | ICD-10-CM | POA: Diagnosis not present

## 2023-04-21 DIAGNOSIS — J449 Chronic obstructive pulmonary disease, unspecified: Secondary | ICD-10-CM | POA: Diagnosis not present

## 2023-04-21 DIAGNOSIS — Z4800 Encounter for change or removal of nonsurgical wound dressing: Secondary | ICD-10-CM | POA: Diagnosis not present

## 2023-04-21 DIAGNOSIS — L988 Other specified disorders of the skin and subcutaneous tissue: Secondary | ICD-10-CM | POA: Diagnosis not present

## 2023-04-21 DIAGNOSIS — E441 Mild protein-calorie malnutrition: Secondary | ICD-10-CM | POA: Diagnosis not present

## 2023-04-21 DIAGNOSIS — I1310 Hypertensive heart and chronic kidney disease without heart failure, with stage 1 through stage 4 chronic kidney disease, or unspecified chronic kidney disease: Secondary | ICD-10-CM | POA: Diagnosis not present

## 2023-04-21 DIAGNOSIS — N183 Chronic kidney disease, stage 3 unspecified: Secondary | ICD-10-CM | POA: Diagnosis not present

## 2023-04-21 DIAGNOSIS — K219 Gastro-esophageal reflux disease without esophagitis: Secondary | ICD-10-CM | POA: Diagnosis not present

## 2023-04-25 DIAGNOSIS — M150 Primary generalized (osteo)arthritis: Secondary | ICD-10-CM | POA: Diagnosis not present

## 2023-04-25 DIAGNOSIS — K449 Diaphragmatic hernia without obstruction or gangrene: Secondary | ICD-10-CM | POA: Diagnosis not present

## 2023-04-25 DIAGNOSIS — K219 Gastro-esophageal reflux disease without esophagitis: Secondary | ICD-10-CM | POA: Diagnosis not present

## 2023-04-25 DIAGNOSIS — Z4800 Encounter for change or removal of nonsurgical wound dressing: Secondary | ICD-10-CM | POA: Diagnosis not present

## 2023-04-25 DIAGNOSIS — I493 Ventricular premature depolarization: Secondary | ICD-10-CM | POA: Diagnosis not present

## 2023-04-25 DIAGNOSIS — N183 Chronic kidney disease, stage 3 unspecified: Secondary | ICD-10-CM | POA: Diagnosis not present

## 2023-04-25 DIAGNOSIS — K589 Irritable bowel syndrome without diarrhea: Secondary | ICD-10-CM | POA: Diagnosis not present

## 2023-04-25 DIAGNOSIS — J449 Chronic obstructive pulmonary disease, unspecified: Secondary | ICD-10-CM | POA: Diagnosis not present

## 2023-04-25 DIAGNOSIS — M47812 Spondylosis without myelopathy or radiculopathy, cervical region: Secondary | ICD-10-CM | POA: Diagnosis not present

## 2023-04-25 DIAGNOSIS — I700 Atherosclerosis of aorta: Secondary | ICD-10-CM | POA: Diagnosis not present

## 2023-04-25 DIAGNOSIS — E441 Mild protein-calorie malnutrition: Secondary | ICD-10-CM | POA: Diagnosis not present

## 2023-04-25 DIAGNOSIS — I1310 Hypertensive heart and chronic kidney disease without heart failure, with stage 1 through stage 4 chronic kidney disease, or unspecified chronic kidney disease: Secondary | ICD-10-CM | POA: Diagnosis not present

## 2023-04-25 DIAGNOSIS — G629 Polyneuropathy, unspecified: Secondary | ICD-10-CM | POA: Diagnosis not present

## 2023-04-25 DIAGNOSIS — E039 Hypothyroidism, unspecified: Secondary | ICD-10-CM | POA: Diagnosis not present

## 2023-04-25 DIAGNOSIS — M51369 Other intervertebral disc degeneration, lumbar region without mention of lumbar back pain or lower extremity pain: Secondary | ICD-10-CM | POA: Diagnosis not present

## 2023-04-25 DIAGNOSIS — L988 Other specified disorders of the skin and subcutaneous tissue: Secondary | ICD-10-CM | POA: Diagnosis not present

## 2023-04-27 DIAGNOSIS — K449 Diaphragmatic hernia without obstruction or gangrene: Secondary | ICD-10-CM | POA: Diagnosis not present

## 2023-04-27 DIAGNOSIS — M150 Primary generalized (osteo)arthritis: Secondary | ICD-10-CM | POA: Diagnosis not present

## 2023-04-27 DIAGNOSIS — I1310 Hypertensive heart and chronic kidney disease without heart failure, with stage 1 through stage 4 chronic kidney disease, or unspecified chronic kidney disease: Secondary | ICD-10-CM | POA: Diagnosis not present

## 2023-04-27 DIAGNOSIS — K589 Irritable bowel syndrome without diarrhea: Secondary | ICD-10-CM | POA: Diagnosis not present

## 2023-04-27 DIAGNOSIS — E039 Hypothyroidism, unspecified: Secondary | ICD-10-CM | POA: Diagnosis not present

## 2023-04-27 DIAGNOSIS — Z4800 Encounter for change or removal of nonsurgical wound dressing: Secondary | ICD-10-CM | POA: Diagnosis not present

## 2023-04-27 DIAGNOSIS — K219 Gastro-esophageal reflux disease without esophagitis: Secondary | ICD-10-CM | POA: Diagnosis not present

## 2023-04-27 DIAGNOSIS — I493 Ventricular premature depolarization: Secondary | ICD-10-CM | POA: Diagnosis not present

## 2023-04-27 DIAGNOSIS — I700 Atherosclerosis of aorta: Secondary | ICD-10-CM | POA: Diagnosis not present

## 2023-04-27 DIAGNOSIS — E441 Mild protein-calorie malnutrition: Secondary | ICD-10-CM | POA: Diagnosis not present

## 2023-04-27 DIAGNOSIS — G629 Polyneuropathy, unspecified: Secondary | ICD-10-CM | POA: Diagnosis not present

## 2023-04-27 DIAGNOSIS — J449 Chronic obstructive pulmonary disease, unspecified: Secondary | ICD-10-CM | POA: Diagnosis not present

## 2023-04-27 DIAGNOSIS — M51369 Other intervertebral disc degeneration, lumbar region without mention of lumbar back pain or lower extremity pain: Secondary | ICD-10-CM | POA: Diagnosis not present

## 2023-04-27 DIAGNOSIS — M47812 Spondylosis without myelopathy or radiculopathy, cervical region: Secondary | ICD-10-CM | POA: Diagnosis not present

## 2023-04-27 DIAGNOSIS — L988 Other specified disorders of the skin and subcutaneous tissue: Secondary | ICD-10-CM | POA: Diagnosis not present

## 2023-04-27 DIAGNOSIS — N183 Chronic kidney disease, stage 3 unspecified: Secondary | ICD-10-CM | POA: Diagnosis not present

## 2023-04-28 DIAGNOSIS — G629 Polyneuropathy, unspecified: Secondary | ICD-10-CM | POA: Diagnosis not present

## 2023-04-28 DIAGNOSIS — N183 Chronic kidney disease, stage 3 unspecified: Secondary | ICD-10-CM | POA: Diagnosis not present

## 2023-04-28 DIAGNOSIS — I1310 Hypertensive heart and chronic kidney disease without heart failure, with stage 1 through stage 4 chronic kidney disease, or unspecified chronic kidney disease: Secondary | ICD-10-CM | POA: Diagnosis not present

## 2023-04-28 DIAGNOSIS — J449 Chronic obstructive pulmonary disease, unspecified: Secondary | ICD-10-CM | POA: Diagnosis not present

## 2023-05-05 DIAGNOSIS — G629 Polyneuropathy, unspecified: Secondary | ICD-10-CM | POA: Diagnosis not present

## 2023-05-05 DIAGNOSIS — M47812 Spondylosis without myelopathy or radiculopathy, cervical region: Secondary | ICD-10-CM | POA: Diagnosis not present

## 2023-05-05 DIAGNOSIS — E559 Vitamin D deficiency, unspecified: Secondary | ICD-10-CM | POA: Diagnosis not present

## 2023-05-05 DIAGNOSIS — L988 Other specified disorders of the skin and subcutaneous tissue: Secondary | ICD-10-CM | POA: Diagnosis not present

## 2023-05-05 DIAGNOSIS — F419 Anxiety disorder, unspecified: Secondary | ICD-10-CM | POA: Diagnosis not present

## 2023-05-05 DIAGNOSIS — E441 Mild protein-calorie malnutrition: Secondary | ICD-10-CM | POA: Diagnosis not present

## 2023-05-05 DIAGNOSIS — Z96652 Presence of left artificial knee joint: Secondary | ICD-10-CM | POA: Diagnosis not present

## 2023-05-05 DIAGNOSIS — H9190 Unspecified hearing loss, unspecified ear: Secondary | ICD-10-CM | POA: Diagnosis not present

## 2023-05-05 DIAGNOSIS — H04129 Dry eye syndrome of unspecified lacrimal gland: Secondary | ICD-10-CM | POA: Diagnosis not present

## 2023-05-05 DIAGNOSIS — Z8673 Personal history of transient ischemic attack (TIA), and cerebral infarction without residual deficits: Secondary | ICD-10-CM | POA: Diagnosis not present

## 2023-05-05 DIAGNOSIS — M8580 Other specified disorders of bone density and structure, unspecified site: Secondary | ICD-10-CM | POA: Diagnosis not present

## 2023-05-05 DIAGNOSIS — I493 Ventricular premature depolarization: Secondary | ICD-10-CM | POA: Diagnosis not present

## 2023-05-05 DIAGNOSIS — N183 Chronic kidney disease, stage 3 unspecified: Secondary | ICD-10-CM | POA: Diagnosis not present

## 2023-05-05 DIAGNOSIS — K219 Gastro-esophageal reflux disease without esophagitis: Secondary | ICD-10-CM | POA: Diagnosis not present

## 2023-05-05 DIAGNOSIS — I1310 Hypertensive heart and chronic kidney disease without heart failure, with stage 1 through stage 4 chronic kidney disease, or unspecified chronic kidney disease: Secondary | ICD-10-CM | POA: Diagnosis not present

## 2023-05-05 DIAGNOSIS — E7800 Pure hypercholesterolemia, unspecified: Secondary | ICD-10-CM | POA: Diagnosis not present

## 2023-05-05 DIAGNOSIS — M51369 Other intervertebral disc degeneration, lumbar region without mention of lumbar back pain or lower extremity pain: Secondary | ICD-10-CM | POA: Diagnosis not present

## 2023-05-05 DIAGNOSIS — M150 Primary generalized (osteo)arthritis: Secondary | ICD-10-CM | POA: Diagnosis not present

## 2023-05-05 DIAGNOSIS — I700 Atherosclerosis of aorta: Secondary | ICD-10-CM | POA: Diagnosis not present

## 2023-05-05 DIAGNOSIS — Z4800 Encounter for change or removal of nonsurgical wound dressing: Secondary | ICD-10-CM | POA: Diagnosis not present

## 2023-05-05 DIAGNOSIS — E039 Hypothyroidism, unspecified: Secondary | ICD-10-CM | POA: Diagnosis not present

## 2023-05-05 DIAGNOSIS — Z85828 Personal history of other malignant neoplasm of skin: Secondary | ICD-10-CM | POA: Diagnosis not present

## 2023-05-05 DIAGNOSIS — K589 Irritable bowel syndrome without diarrhea: Secondary | ICD-10-CM | POA: Diagnosis not present

## 2023-05-05 DIAGNOSIS — J449 Chronic obstructive pulmonary disease, unspecified: Secondary | ICD-10-CM | POA: Diagnosis not present

## 2023-05-05 DIAGNOSIS — K449 Diaphragmatic hernia without obstruction or gangrene: Secondary | ICD-10-CM | POA: Diagnosis not present

## 2023-05-11 DIAGNOSIS — E039 Hypothyroidism, unspecified: Secondary | ICD-10-CM | POA: Diagnosis not present

## 2023-05-11 DIAGNOSIS — E441 Mild protein-calorie malnutrition: Secondary | ICD-10-CM | POA: Diagnosis not present

## 2023-05-11 DIAGNOSIS — K589 Irritable bowel syndrome without diarrhea: Secondary | ICD-10-CM | POA: Diagnosis not present

## 2023-05-11 DIAGNOSIS — M51369 Other intervertebral disc degeneration, lumbar region without mention of lumbar back pain or lower extremity pain: Secondary | ICD-10-CM | POA: Diagnosis not present

## 2023-05-11 DIAGNOSIS — G629 Polyneuropathy, unspecified: Secondary | ICD-10-CM | POA: Diagnosis not present

## 2023-05-11 DIAGNOSIS — Z4800 Encounter for change or removal of nonsurgical wound dressing: Secondary | ICD-10-CM | POA: Diagnosis not present

## 2023-05-11 DIAGNOSIS — M150 Primary generalized (osteo)arthritis: Secondary | ICD-10-CM | POA: Diagnosis not present

## 2023-05-11 DIAGNOSIS — I700 Atherosclerosis of aorta: Secondary | ICD-10-CM | POA: Diagnosis not present

## 2023-05-11 DIAGNOSIS — K449 Diaphragmatic hernia without obstruction or gangrene: Secondary | ICD-10-CM | POA: Diagnosis not present

## 2023-05-11 DIAGNOSIS — L988 Other specified disorders of the skin and subcutaneous tissue: Secondary | ICD-10-CM | POA: Diagnosis not present

## 2023-05-11 DIAGNOSIS — I493 Ventricular premature depolarization: Secondary | ICD-10-CM | POA: Diagnosis not present

## 2023-05-11 DIAGNOSIS — K219 Gastro-esophageal reflux disease without esophagitis: Secondary | ICD-10-CM | POA: Diagnosis not present

## 2023-05-11 DIAGNOSIS — M47812 Spondylosis without myelopathy or radiculopathy, cervical region: Secondary | ICD-10-CM | POA: Diagnosis not present

## 2023-05-11 DIAGNOSIS — N183 Chronic kidney disease, stage 3 unspecified: Secondary | ICD-10-CM | POA: Diagnosis not present

## 2023-05-11 DIAGNOSIS — I1310 Hypertensive heart and chronic kidney disease without heart failure, with stage 1 through stage 4 chronic kidney disease, or unspecified chronic kidney disease: Secondary | ICD-10-CM | POA: Diagnosis not present

## 2023-05-11 DIAGNOSIS — J449 Chronic obstructive pulmonary disease, unspecified: Secondary | ICD-10-CM | POA: Diagnosis not present

## 2023-05-20 DIAGNOSIS — Z4800 Encounter for change or removal of nonsurgical wound dressing: Secondary | ICD-10-CM | POA: Diagnosis not present

## 2023-05-20 DIAGNOSIS — E441 Mild protein-calorie malnutrition: Secondary | ICD-10-CM | POA: Diagnosis not present

## 2023-05-20 DIAGNOSIS — L988 Other specified disorders of the skin and subcutaneous tissue: Secondary | ICD-10-CM | POA: Diagnosis not present

## 2023-05-20 DIAGNOSIS — K219 Gastro-esophageal reflux disease without esophagitis: Secondary | ICD-10-CM | POA: Diagnosis not present

## 2023-05-20 DIAGNOSIS — M47812 Spondylosis without myelopathy or radiculopathy, cervical region: Secondary | ICD-10-CM | POA: Diagnosis not present

## 2023-05-20 DIAGNOSIS — M51369 Other intervertebral disc degeneration, lumbar region without mention of lumbar back pain or lower extremity pain: Secondary | ICD-10-CM | POA: Diagnosis not present

## 2023-05-20 DIAGNOSIS — I1310 Hypertensive heart and chronic kidney disease without heart failure, with stage 1 through stage 4 chronic kidney disease, or unspecified chronic kidney disease: Secondary | ICD-10-CM | POA: Diagnosis not present

## 2023-05-20 DIAGNOSIS — I493 Ventricular premature depolarization: Secondary | ICD-10-CM | POA: Diagnosis not present

## 2023-05-20 DIAGNOSIS — G629 Polyneuropathy, unspecified: Secondary | ICD-10-CM | POA: Diagnosis not present

## 2023-05-20 DIAGNOSIS — I700 Atherosclerosis of aorta: Secondary | ICD-10-CM | POA: Diagnosis not present

## 2023-05-20 DIAGNOSIS — J449 Chronic obstructive pulmonary disease, unspecified: Secondary | ICD-10-CM | POA: Diagnosis not present

## 2023-05-20 DIAGNOSIS — K589 Irritable bowel syndrome without diarrhea: Secondary | ICD-10-CM | POA: Diagnosis not present

## 2023-05-20 DIAGNOSIS — N183 Chronic kidney disease, stage 3 unspecified: Secondary | ICD-10-CM | POA: Diagnosis not present

## 2023-05-20 DIAGNOSIS — M150 Primary generalized (osteo)arthritis: Secondary | ICD-10-CM | POA: Diagnosis not present

## 2023-05-20 DIAGNOSIS — K449 Diaphragmatic hernia without obstruction or gangrene: Secondary | ICD-10-CM | POA: Diagnosis not present

## 2023-05-20 DIAGNOSIS — E039 Hypothyroidism, unspecified: Secondary | ICD-10-CM | POA: Diagnosis not present

## 2023-05-27 DIAGNOSIS — K449 Diaphragmatic hernia without obstruction or gangrene: Secondary | ICD-10-CM | POA: Diagnosis not present

## 2023-05-27 DIAGNOSIS — K219 Gastro-esophageal reflux disease without esophagitis: Secondary | ICD-10-CM | POA: Diagnosis not present

## 2023-05-27 DIAGNOSIS — I493 Ventricular premature depolarization: Secondary | ICD-10-CM | POA: Diagnosis not present

## 2023-05-27 DIAGNOSIS — I1310 Hypertensive heart and chronic kidney disease without heart failure, with stage 1 through stage 4 chronic kidney disease, or unspecified chronic kidney disease: Secondary | ICD-10-CM | POA: Diagnosis not present

## 2023-05-27 DIAGNOSIS — K589 Irritable bowel syndrome without diarrhea: Secondary | ICD-10-CM | POA: Diagnosis not present

## 2023-05-27 DIAGNOSIS — Z4800 Encounter for change or removal of nonsurgical wound dressing: Secondary | ICD-10-CM | POA: Diagnosis not present

## 2023-05-27 DIAGNOSIS — J449 Chronic obstructive pulmonary disease, unspecified: Secondary | ICD-10-CM | POA: Diagnosis not present

## 2023-05-27 DIAGNOSIS — M47812 Spondylosis without myelopathy or radiculopathy, cervical region: Secondary | ICD-10-CM | POA: Diagnosis not present

## 2023-05-27 DIAGNOSIS — E039 Hypothyroidism, unspecified: Secondary | ICD-10-CM | POA: Diagnosis not present

## 2023-05-27 DIAGNOSIS — M51369 Other intervertebral disc degeneration, lumbar region without mention of lumbar back pain or lower extremity pain: Secondary | ICD-10-CM | POA: Diagnosis not present

## 2023-05-27 DIAGNOSIS — M150 Primary generalized (osteo)arthritis: Secondary | ICD-10-CM | POA: Diagnosis not present

## 2023-05-27 DIAGNOSIS — L988 Other specified disorders of the skin and subcutaneous tissue: Secondary | ICD-10-CM | POA: Diagnosis not present

## 2023-05-27 DIAGNOSIS — I700 Atherosclerosis of aorta: Secondary | ICD-10-CM | POA: Diagnosis not present

## 2023-05-27 DIAGNOSIS — N183 Chronic kidney disease, stage 3 unspecified: Secondary | ICD-10-CM | POA: Diagnosis not present

## 2023-05-27 DIAGNOSIS — E441 Mild protein-calorie malnutrition: Secondary | ICD-10-CM | POA: Diagnosis not present

## 2023-05-27 DIAGNOSIS — G629 Polyneuropathy, unspecified: Secondary | ICD-10-CM | POA: Diagnosis not present

## 2023-05-30 DIAGNOSIS — E441 Mild protein-calorie malnutrition: Secondary | ICD-10-CM | POA: Diagnosis not present

## 2023-05-30 DIAGNOSIS — F419 Anxiety disorder, unspecified: Secondary | ICD-10-CM | POA: Diagnosis not present

## 2023-05-30 DIAGNOSIS — J449 Chronic obstructive pulmonary disease, unspecified: Secondary | ICD-10-CM | POA: Diagnosis not present

## 2023-05-30 DIAGNOSIS — K219 Gastro-esophageal reflux disease without esophagitis: Secondary | ICD-10-CM | POA: Diagnosis not present

## 2023-05-30 DIAGNOSIS — K449 Diaphragmatic hernia without obstruction or gangrene: Secondary | ICD-10-CM | POA: Diagnosis not present

## 2023-05-30 DIAGNOSIS — Z8673 Personal history of transient ischemic attack (TIA), and cerebral infarction without residual deficits: Secondary | ICD-10-CM | POA: Diagnosis not present

## 2023-05-30 DIAGNOSIS — E559 Vitamin D deficiency, unspecified: Secondary | ICD-10-CM | POA: Diagnosis not present

## 2023-05-30 DIAGNOSIS — G629 Polyneuropathy, unspecified: Secondary | ICD-10-CM | POA: Diagnosis not present

## 2023-05-30 DIAGNOSIS — M8580 Other specified disorders of bone density and structure, unspecified site: Secondary | ICD-10-CM | POA: Diagnosis not present

## 2023-05-30 DIAGNOSIS — L988 Other specified disorders of the skin and subcutaneous tissue: Secondary | ICD-10-CM | POA: Diagnosis not present

## 2023-05-30 DIAGNOSIS — N183 Chronic kidney disease, stage 3 unspecified: Secondary | ICD-10-CM | POA: Diagnosis not present

## 2023-05-30 DIAGNOSIS — H9190 Unspecified hearing loss, unspecified ear: Secondary | ICD-10-CM | POA: Diagnosis not present

## 2023-05-30 DIAGNOSIS — E7800 Pure hypercholesterolemia, unspecified: Secondary | ICD-10-CM | POA: Diagnosis not present

## 2023-05-30 DIAGNOSIS — M51369 Other intervertebral disc degeneration, lumbar region without mention of lumbar back pain or lower extremity pain: Secondary | ICD-10-CM | POA: Diagnosis not present

## 2023-05-30 DIAGNOSIS — M47812 Spondylosis without myelopathy or radiculopathy, cervical region: Secondary | ICD-10-CM | POA: Diagnosis not present

## 2023-05-30 DIAGNOSIS — Z85828 Personal history of other malignant neoplasm of skin: Secondary | ICD-10-CM | POA: Diagnosis not present

## 2023-05-30 DIAGNOSIS — I493 Ventricular premature depolarization: Secondary | ICD-10-CM | POA: Diagnosis not present

## 2023-05-30 DIAGNOSIS — H04129 Dry eye syndrome of unspecified lacrimal gland: Secondary | ICD-10-CM | POA: Diagnosis not present

## 2023-05-30 DIAGNOSIS — M150 Primary generalized (osteo)arthritis: Secondary | ICD-10-CM | POA: Diagnosis not present

## 2023-05-30 DIAGNOSIS — Z96652 Presence of left artificial knee joint: Secondary | ICD-10-CM | POA: Diagnosis not present

## 2023-05-30 DIAGNOSIS — Z4800 Encounter for change or removal of nonsurgical wound dressing: Secondary | ICD-10-CM | POA: Diagnosis not present

## 2023-05-30 DIAGNOSIS — I1310 Hypertensive heart and chronic kidney disease without heart failure, with stage 1 through stage 4 chronic kidney disease, or unspecified chronic kidney disease: Secondary | ICD-10-CM | POA: Diagnosis not present

## 2023-05-30 DIAGNOSIS — K589 Irritable bowel syndrome without diarrhea: Secondary | ICD-10-CM | POA: Diagnosis not present

## 2023-05-30 DIAGNOSIS — I700 Atherosclerosis of aorta: Secondary | ICD-10-CM | POA: Diagnosis not present

## 2023-05-30 DIAGNOSIS — E039 Hypothyroidism, unspecified: Secondary | ICD-10-CM | POA: Diagnosis not present

## 2023-06-08 DIAGNOSIS — G629 Polyneuropathy, unspecified: Secondary | ICD-10-CM | POA: Diagnosis not present

## 2023-06-08 DIAGNOSIS — M51369 Other intervertebral disc degeneration, lumbar region without mention of lumbar back pain or lower extremity pain: Secondary | ICD-10-CM | POA: Diagnosis not present

## 2023-06-08 DIAGNOSIS — I493 Ventricular premature depolarization: Secondary | ICD-10-CM | POA: Diagnosis not present

## 2023-06-08 DIAGNOSIS — N183 Chronic kidney disease, stage 3 unspecified: Secondary | ICD-10-CM | POA: Diagnosis not present

## 2023-06-08 DIAGNOSIS — M150 Primary generalized (osteo)arthritis: Secondary | ICD-10-CM | POA: Diagnosis not present

## 2023-06-08 DIAGNOSIS — E039 Hypothyroidism, unspecified: Secondary | ICD-10-CM | POA: Diagnosis not present

## 2023-06-08 DIAGNOSIS — J449 Chronic obstructive pulmonary disease, unspecified: Secondary | ICD-10-CM | POA: Diagnosis not present

## 2023-06-08 DIAGNOSIS — Z4800 Encounter for change or removal of nonsurgical wound dressing: Secondary | ICD-10-CM | POA: Diagnosis not present

## 2023-06-08 DIAGNOSIS — E441 Mild protein-calorie malnutrition: Secondary | ICD-10-CM | POA: Diagnosis not present

## 2023-06-08 DIAGNOSIS — I700 Atherosclerosis of aorta: Secondary | ICD-10-CM | POA: Diagnosis not present

## 2023-06-08 DIAGNOSIS — L988 Other specified disorders of the skin and subcutaneous tissue: Secondary | ICD-10-CM | POA: Diagnosis not present

## 2023-06-08 DIAGNOSIS — I1310 Hypertensive heart and chronic kidney disease without heart failure, with stage 1 through stage 4 chronic kidney disease, or unspecified chronic kidney disease: Secondary | ICD-10-CM | POA: Diagnosis not present

## 2023-06-08 DIAGNOSIS — K449 Diaphragmatic hernia without obstruction or gangrene: Secondary | ICD-10-CM | POA: Diagnosis not present

## 2023-06-08 DIAGNOSIS — K219 Gastro-esophageal reflux disease without esophagitis: Secondary | ICD-10-CM | POA: Diagnosis not present

## 2023-06-08 DIAGNOSIS — K589 Irritable bowel syndrome without diarrhea: Secondary | ICD-10-CM | POA: Diagnosis not present

## 2023-06-08 DIAGNOSIS — M47812 Spondylosis without myelopathy or radiculopathy, cervical region: Secondary | ICD-10-CM | POA: Diagnosis not present

## 2023-06-15 DIAGNOSIS — N183 Chronic kidney disease, stage 3 unspecified: Secondary | ICD-10-CM | POA: Diagnosis not present

## 2023-06-15 DIAGNOSIS — G629 Polyneuropathy, unspecified: Secondary | ICD-10-CM | POA: Diagnosis not present

## 2023-06-15 DIAGNOSIS — K589 Irritable bowel syndrome without diarrhea: Secondary | ICD-10-CM | POA: Diagnosis not present

## 2023-06-15 DIAGNOSIS — E039 Hypothyroidism, unspecified: Secondary | ICD-10-CM | POA: Diagnosis not present

## 2023-06-15 DIAGNOSIS — K449 Diaphragmatic hernia without obstruction or gangrene: Secondary | ICD-10-CM | POA: Diagnosis not present

## 2023-06-15 DIAGNOSIS — K219 Gastro-esophageal reflux disease without esophagitis: Secondary | ICD-10-CM | POA: Diagnosis not present

## 2023-06-15 DIAGNOSIS — M47812 Spondylosis without myelopathy or radiculopathy, cervical region: Secondary | ICD-10-CM | POA: Diagnosis not present

## 2023-06-15 DIAGNOSIS — I1310 Hypertensive heart and chronic kidney disease without heart failure, with stage 1 through stage 4 chronic kidney disease, or unspecified chronic kidney disease: Secondary | ICD-10-CM | POA: Diagnosis not present

## 2023-06-15 DIAGNOSIS — M150 Primary generalized (osteo)arthritis: Secondary | ICD-10-CM | POA: Diagnosis not present

## 2023-06-15 DIAGNOSIS — I700 Atherosclerosis of aorta: Secondary | ICD-10-CM | POA: Diagnosis not present

## 2023-06-15 DIAGNOSIS — L988 Other specified disorders of the skin and subcutaneous tissue: Secondary | ICD-10-CM | POA: Diagnosis not present

## 2023-06-15 DIAGNOSIS — I493 Ventricular premature depolarization: Secondary | ICD-10-CM | POA: Diagnosis not present

## 2023-06-15 DIAGNOSIS — M51369 Other intervertebral disc degeneration, lumbar region without mention of lumbar back pain or lower extremity pain: Secondary | ICD-10-CM | POA: Diagnosis not present

## 2023-06-15 DIAGNOSIS — J449 Chronic obstructive pulmonary disease, unspecified: Secondary | ICD-10-CM | POA: Diagnosis not present

## 2023-06-15 DIAGNOSIS — E441 Mild protein-calorie malnutrition: Secondary | ICD-10-CM | POA: Diagnosis not present

## 2023-06-15 DIAGNOSIS — Z4800 Encounter for change or removal of nonsurgical wound dressing: Secondary | ICD-10-CM | POA: Diagnosis not present

## 2023-06-24 DIAGNOSIS — M150 Primary generalized (osteo)arthritis: Secondary | ICD-10-CM | POA: Diagnosis not present

## 2023-06-24 DIAGNOSIS — M8580 Other specified disorders of bone density and structure, unspecified site: Secondary | ICD-10-CM | POA: Diagnosis not present

## 2023-06-24 DIAGNOSIS — F419 Anxiety disorder, unspecified: Secondary | ICD-10-CM | POA: Diagnosis not present

## 2023-06-24 DIAGNOSIS — E441 Mild protein-calorie malnutrition: Secondary | ICD-10-CM | POA: Diagnosis not present

## 2023-06-24 DIAGNOSIS — J449 Chronic obstructive pulmonary disease, unspecified: Secondary | ICD-10-CM | POA: Diagnosis not present

## 2023-06-24 DIAGNOSIS — I700 Atherosclerosis of aorta: Secondary | ICD-10-CM | POA: Diagnosis not present

## 2023-06-24 DIAGNOSIS — M47812 Spondylosis without myelopathy or radiculopathy, cervical region: Secondary | ICD-10-CM | POA: Diagnosis not present

## 2023-06-24 DIAGNOSIS — K219 Gastro-esophageal reflux disease without esophagitis: Secondary | ICD-10-CM | POA: Diagnosis not present

## 2023-06-24 DIAGNOSIS — N183 Chronic kidney disease, stage 3 unspecified: Secondary | ICD-10-CM | POA: Diagnosis not present

## 2023-06-24 DIAGNOSIS — I493 Ventricular premature depolarization: Secondary | ICD-10-CM | POA: Diagnosis not present

## 2023-06-24 DIAGNOSIS — E039 Hypothyroidism, unspecified: Secondary | ICD-10-CM | POA: Diagnosis not present

## 2023-06-24 DIAGNOSIS — L988 Other specified disorders of the skin and subcutaneous tissue: Secondary | ICD-10-CM | POA: Diagnosis not present

## 2023-06-24 DIAGNOSIS — H9190 Unspecified hearing loss, unspecified ear: Secondary | ICD-10-CM | POA: Diagnosis not present

## 2023-06-24 DIAGNOSIS — H04129 Dry eye syndrome of unspecified lacrimal gland: Secondary | ICD-10-CM | POA: Diagnosis not present

## 2023-06-24 DIAGNOSIS — G629 Polyneuropathy, unspecified: Secondary | ICD-10-CM | POA: Diagnosis not present

## 2023-06-24 DIAGNOSIS — Z4800 Encounter for change or removal of nonsurgical wound dressing: Secondary | ICD-10-CM | POA: Diagnosis not present

## 2023-06-24 DIAGNOSIS — Z96652 Presence of left artificial knee joint: Secondary | ICD-10-CM | POA: Diagnosis not present

## 2023-06-24 DIAGNOSIS — I1310 Hypertensive heart and chronic kidney disease without heart failure, with stage 1 through stage 4 chronic kidney disease, or unspecified chronic kidney disease: Secondary | ICD-10-CM | POA: Diagnosis not present

## 2023-06-24 DIAGNOSIS — E7800 Pure hypercholesterolemia, unspecified: Secondary | ICD-10-CM | POA: Diagnosis not present

## 2023-06-24 DIAGNOSIS — E559 Vitamin D deficiency, unspecified: Secondary | ICD-10-CM | POA: Diagnosis not present

## 2023-06-24 DIAGNOSIS — Z8673 Personal history of transient ischemic attack (TIA), and cerebral infarction without residual deficits: Secondary | ICD-10-CM | POA: Diagnosis not present

## 2023-06-24 DIAGNOSIS — M51369 Other intervertebral disc degeneration, lumbar region without mention of lumbar back pain or lower extremity pain: Secondary | ICD-10-CM | POA: Diagnosis not present

## 2023-06-24 DIAGNOSIS — K589 Irritable bowel syndrome without diarrhea: Secondary | ICD-10-CM | POA: Diagnosis not present

## 2023-06-24 DIAGNOSIS — Z85828 Personal history of other malignant neoplasm of skin: Secondary | ICD-10-CM | POA: Diagnosis not present

## 2023-06-24 DIAGNOSIS — K449 Diaphragmatic hernia without obstruction or gangrene: Secondary | ICD-10-CM | POA: Diagnosis not present

## 2023-07-11 DIAGNOSIS — E785 Hyperlipidemia, unspecified: Secondary | ICD-10-CM | POA: Diagnosis not present

## 2023-07-11 DIAGNOSIS — N183 Chronic kidney disease, stage 3 unspecified: Secondary | ICD-10-CM | POA: Diagnosis not present

## 2023-07-11 DIAGNOSIS — I700 Atherosclerosis of aorta: Secondary | ICD-10-CM | POA: Diagnosis not present

## 2023-07-11 DIAGNOSIS — I129 Hypertensive chronic kidney disease with stage 1 through stage 4 chronic kidney disease, or unspecified chronic kidney disease: Secondary | ICD-10-CM | POA: Diagnosis not present

## 2023-07-12 DIAGNOSIS — R350 Frequency of micturition: Secondary | ICD-10-CM | POA: Diagnosis not present

## 2023-07-15 DIAGNOSIS — E039 Hypothyroidism, unspecified: Secondary | ICD-10-CM | POA: Diagnosis not present

## 2023-07-15 DIAGNOSIS — T17208D Unspecified foreign body in pharynx causing other injury, subsequent encounter: Secondary | ICD-10-CM | POA: Diagnosis not present

## 2023-07-15 DIAGNOSIS — G629 Polyneuropathy, unspecified: Secondary | ICD-10-CM | POA: Diagnosis not present

## 2023-07-15 DIAGNOSIS — I129 Hypertensive chronic kidney disease with stage 1 through stage 4 chronic kidney disease, or unspecified chronic kidney disease: Secondary | ICD-10-CM | POA: Diagnosis not present

## 2023-07-15 DIAGNOSIS — Z85828 Personal history of other malignant neoplasm of skin: Secondary | ICD-10-CM | POA: Diagnosis not present

## 2023-07-15 DIAGNOSIS — F419 Anxiety disorder, unspecified: Secondary | ICD-10-CM | POA: Diagnosis not present

## 2023-07-15 DIAGNOSIS — M5136 Other intervertebral disc degeneration, lumbar region with discogenic back pain only: Secondary | ICD-10-CM | POA: Diagnosis not present

## 2023-07-15 DIAGNOSIS — E7800 Pure hypercholesterolemia, unspecified: Secondary | ICD-10-CM | POA: Diagnosis not present

## 2023-07-15 DIAGNOSIS — J449 Chronic obstructive pulmonary disease, unspecified: Secondary | ICD-10-CM | POA: Diagnosis not present

## 2023-07-15 DIAGNOSIS — I700 Atherosclerosis of aorta: Secondary | ICD-10-CM | POA: Diagnosis not present

## 2023-07-15 DIAGNOSIS — Z8673 Personal history of transient ischemic attack (TIA), and cerebral infarction without residual deficits: Secondary | ICD-10-CM | POA: Diagnosis not present

## 2023-07-15 DIAGNOSIS — M545 Low back pain, unspecified: Secondary | ICD-10-CM | POA: Diagnosis not present

## 2023-07-15 DIAGNOSIS — R350 Frequency of micturition: Secondary | ICD-10-CM | POA: Diagnosis not present

## 2023-07-15 DIAGNOSIS — N3281 Overactive bladder: Secondary | ICD-10-CM | POA: Diagnosis not present

## 2023-07-15 DIAGNOSIS — Z556 Problems related to health literacy: Secondary | ICD-10-CM | POA: Diagnosis not present

## 2023-07-15 DIAGNOSIS — N1832 Chronic kidney disease, stage 3b: Secondary | ICD-10-CM | POA: Diagnosis not present

## 2023-07-15 DIAGNOSIS — Z87891 Personal history of nicotine dependence: Secondary | ICD-10-CM | POA: Diagnosis not present

## 2023-07-15 DIAGNOSIS — M47812 Spondylosis without myelopathy or radiculopathy, cervical region: Secondary | ICD-10-CM | POA: Diagnosis not present

## 2023-07-16 DIAGNOSIS — M5136 Other intervertebral disc degeneration, lumbar region with discogenic back pain only: Secondary | ICD-10-CM | POA: Diagnosis not present

## 2023-07-16 DIAGNOSIS — I129 Hypertensive chronic kidney disease with stage 1 through stage 4 chronic kidney disease, or unspecified chronic kidney disease: Secondary | ICD-10-CM | POA: Diagnosis not present

## 2023-07-16 DIAGNOSIS — Z85828 Personal history of other malignant neoplasm of skin: Secondary | ICD-10-CM | POA: Diagnosis not present

## 2023-07-16 DIAGNOSIS — R350 Frequency of micturition: Secondary | ICD-10-CM | POA: Diagnosis not present

## 2023-07-16 DIAGNOSIS — Z8673 Personal history of transient ischemic attack (TIA), and cerebral infarction without residual deficits: Secondary | ICD-10-CM | POA: Diagnosis not present

## 2023-07-16 DIAGNOSIS — E7800 Pure hypercholesterolemia, unspecified: Secondary | ICD-10-CM | POA: Diagnosis not present

## 2023-07-16 DIAGNOSIS — E039 Hypothyroidism, unspecified: Secondary | ICD-10-CM | POA: Diagnosis not present

## 2023-07-16 DIAGNOSIS — M47812 Spondylosis without myelopathy or radiculopathy, cervical region: Secondary | ICD-10-CM | POA: Diagnosis not present

## 2023-07-16 DIAGNOSIS — M545 Low back pain, unspecified: Secondary | ICD-10-CM | POA: Diagnosis not present

## 2023-07-16 DIAGNOSIS — Z87891 Personal history of nicotine dependence: Secondary | ICD-10-CM | POA: Diagnosis not present

## 2023-07-16 DIAGNOSIS — T17208D Unspecified foreign body in pharynx causing other injury, subsequent encounter: Secondary | ICD-10-CM | POA: Diagnosis not present

## 2023-07-16 DIAGNOSIS — N1832 Chronic kidney disease, stage 3b: Secondary | ICD-10-CM | POA: Diagnosis not present

## 2023-07-16 DIAGNOSIS — F419 Anxiety disorder, unspecified: Secondary | ICD-10-CM | POA: Diagnosis not present

## 2023-07-16 DIAGNOSIS — G629 Polyneuropathy, unspecified: Secondary | ICD-10-CM | POA: Diagnosis not present

## 2023-07-16 DIAGNOSIS — N3281 Overactive bladder: Secondary | ICD-10-CM | POA: Diagnosis not present

## 2023-07-16 DIAGNOSIS — Z556 Problems related to health literacy: Secondary | ICD-10-CM | POA: Diagnosis not present

## 2023-07-16 DIAGNOSIS — I700 Atherosclerosis of aorta: Secondary | ICD-10-CM | POA: Diagnosis not present

## 2023-07-16 DIAGNOSIS — J449 Chronic obstructive pulmonary disease, unspecified: Secondary | ICD-10-CM | POA: Diagnosis not present

## 2023-07-20 DIAGNOSIS — E039 Hypothyroidism, unspecified: Secondary | ICD-10-CM | POA: Diagnosis not present

## 2023-07-20 DIAGNOSIS — Z87891 Personal history of nicotine dependence: Secondary | ICD-10-CM | POA: Diagnosis not present

## 2023-07-20 DIAGNOSIS — F419 Anxiety disorder, unspecified: Secondary | ICD-10-CM | POA: Diagnosis not present

## 2023-07-20 DIAGNOSIS — I700 Atherosclerosis of aorta: Secondary | ICD-10-CM | POA: Diagnosis not present

## 2023-07-20 DIAGNOSIS — J449 Chronic obstructive pulmonary disease, unspecified: Secondary | ICD-10-CM | POA: Diagnosis not present

## 2023-07-20 DIAGNOSIS — G629 Polyneuropathy, unspecified: Secondary | ICD-10-CM | POA: Diagnosis not present

## 2023-07-20 DIAGNOSIS — Z556 Problems related to health literacy: Secondary | ICD-10-CM | POA: Diagnosis not present

## 2023-07-20 DIAGNOSIS — M47812 Spondylosis without myelopathy or radiculopathy, cervical region: Secondary | ICD-10-CM | POA: Diagnosis not present

## 2023-07-20 DIAGNOSIS — M545 Low back pain, unspecified: Secondary | ICD-10-CM | POA: Diagnosis not present

## 2023-07-20 DIAGNOSIS — N1832 Chronic kidney disease, stage 3b: Secondary | ICD-10-CM | POA: Diagnosis not present

## 2023-07-20 DIAGNOSIS — Z85828 Personal history of other malignant neoplasm of skin: Secondary | ICD-10-CM | POA: Diagnosis not present

## 2023-07-20 DIAGNOSIS — I129 Hypertensive chronic kidney disease with stage 1 through stage 4 chronic kidney disease, or unspecified chronic kidney disease: Secondary | ICD-10-CM | POA: Diagnosis not present

## 2023-07-20 DIAGNOSIS — T17208D Unspecified foreign body in pharynx causing other injury, subsequent encounter: Secondary | ICD-10-CM | POA: Diagnosis not present

## 2023-07-20 DIAGNOSIS — E7800 Pure hypercholesterolemia, unspecified: Secondary | ICD-10-CM | POA: Diagnosis not present

## 2023-07-20 DIAGNOSIS — Z8673 Personal history of transient ischemic attack (TIA), and cerebral infarction without residual deficits: Secondary | ICD-10-CM | POA: Diagnosis not present

## 2023-07-20 DIAGNOSIS — M5136 Other intervertebral disc degeneration, lumbar region with discogenic back pain only: Secondary | ICD-10-CM | POA: Diagnosis not present

## 2023-07-20 DIAGNOSIS — R350 Frequency of micturition: Secondary | ICD-10-CM | POA: Diagnosis not present

## 2023-07-20 DIAGNOSIS — N3281 Overactive bladder: Secondary | ICD-10-CM | POA: Diagnosis not present

## 2023-07-21 DIAGNOSIS — T17208D Unspecified foreign body in pharynx causing other injury, subsequent encounter: Secondary | ICD-10-CM | POA: Diagnosis not present

## 2023-07-21 DIAGNOSIS — M47812 Spondylosis without myelopathy or radiculopathy, cervical region: Secondary | ICD-10-CM | POA: Diagnosis not present

## 2023-07-21 DIAGNOSIS — N3281 Overactive bladder: Secondary | ICD-10-CM | POA: Diagnosis not present

## 2023-07-21 DIAGNOSIS — R350 Frequency of micturition: Secondary | ICD-10-CM | POA: Diagnosis not present

## 2023-07-21 DIAGNOSIS — F419 Anxiety disorder, unspecified: Secondary | ICD-10-CM | POA: Diagnosis not present

## 2023-07-21 DIAGNOSIS — J449 Chronic obstructive pulmonary disease, unspecified: Secondary | ICD-10-CM | POA: Diagnosis not present

## 2023-07-21 DIAGNOSIS — I700 Atherosclerosis of aorta: Secondary | ICD-10-CM | POA: Diagnosis not present

## 2023-07-21 DIAGNOSIS — M545 Low back pain, unspecified: Secondary | ICD-10-CM | POA: Diagnosis not present

## 2023-07-21 DIAGNOSIS — E039 Hypothyroidism, unspecified: Secondary | ICD-10-CM | POA: Diagnosis not present

## 2023-07-21 DIAGNOSIS — E7800 Pure hypercholesterolemia, unspecified: Secondary | ICD-10-CM | POA: Diagnosis not present

## 2023-07-21 DIAGNOSIS — Z556 Problems related to health literacy: Secondary | ICD-10-CM | POA: Diagnosis not present

## 2023-07-21 DIAGNOSIS — G629 Polyneuropathy, unspecified: Secondary | ICD-10-CM | POA: Diagnosis not present

## 2023-07-21 DIAGNOSIS — Z85828 Personal history of other malignant neoplasm of skin: Secondary | ICD-10-CM | POA: Diagnosis not present

## 2023-07-21 DIAGNOSIS — Z8673 Personal history of transient ischemic attack (TIA), and cerebral infarction without residual deficits: Secondary | ICD-10-CM | POA: Diagnosis not present

## 2023-07-21 DIAGNOSIS — I129 Hypertensive chronic kidney disease with stage 1 through stage 4 chronic kidney disease, or unspecified chronic kidney disease: Secondary | ICD-10-CM | POA: Diagnosis not present

## 2023-07-21 DIAGNOSIS — Z87891 Personal history of nicotine dependence: Secondary | ICD-10-CM | POA: Diagnosis not present

## 2023-07-21 DIAGNOSIS — N1832 Chronic kidney disease, stage 3b: Secondary | ICD-10-CM | POA: Diagnosis not present

## 2023-07-21 DIAGNOSIS — M5136 Other intervertebral disc degeneration, lumbar region with discogenic back pain only: Secondary | ICD-10-CM | POA: Diagnosis not present

## 2023-07-26 DIAGNOSIS — T17208D Unspecified foreign body in pharynx causing other injury, subsequent encounter: Secondary | ICD-10-CM | POA: Diagnosis not present

## 2023-07-26 DIAGNOSIS — N3281 Overactive bladder: Secondary | ICD-10-CM | POA: Diagnosis not present

## 2023-07-26 DIAGNOSIS — Z556 Problems related to health literacy: Secondary | ICD-10-CM | POA: Diagnosis not present

## 2023-07-26 DIAGNOSIS — R350 Frequency of micturition: Secondary | ICD-10-CM | POA: Diagnosis not present

## 2023-07-26 DIAGNOSIS — E039 Hypothyroidism, unspecified: Secondary | ICD-10-CM | POA: Diagnosis not present

## 2023-07-26 DIAGNOSIS — Z85828 Personal history of other malignant neoplasm of skin: Secondary | ICD-10-CM | POA: Diagnosis not present

## 2023-07-26 DIAGNOSIS — M47812 Spondylosis without myelopathy or radiculopathy, cervical region: Secondary | ICD-10-CM | POA: Diagnosis not present

## 2023-07-26 DIAGNOSIS — N1832 Chronic kidney disease, stage 3b: Secondary | ICD-10-CM | POA: Diagnosis not present

## 2023-07-26 DIAGNOSIS — F419 Anxiety disorder, unspecified: Secondary | ICD-10-CM | POA: Diagnosis not present

## 2023-07-26 DIAGNOSIS — M5136 Other intervertebral disc degeneration, lumbar region with discogenic back pain only: Secondary | ICD-10-CM | POA: Diagnosis not present

## 2023-07-26 DIAGNOSIS — M545 Low back pain, unspecified: Secondary | ICD-10-CM | POA: Diagnosis not present

## 2023-07-26 DIAGNOSIS — E7800 Pure hypercholesterolemia, unspecified: Secondary | ICD-10-CM | POA: Diagnosis not present

## 2023-07-26 DIAGNOSIS — J449 Chronic obstructive pulmonary disease, unspecified: Secondary | ICD-10-CM | POA: Diagnosis not present

## 2023-07-26 DIAGNOSIS — I700 Atherosclerosis of aorta: Secondary | ICD-10-CM | POA: Diagnosis not present

## 2023-07-26 DIAGNOSIS — Z87891 Personal history of nicotine dependence: Secondary | ICD-10-CM | POA: Diagnosis not present

## 2023-07-26 DIAGNOSIS — G629 Polyneuropathy, unspecified: Secondary | ICD-10-CM | POA: Diagnosis not present

## 2023-07-26 DIAGNOSIS — Z8673 Personal history of transient ischemic attack (TIA), and cerebral infarction without residual deficits: Secondary | ICD-10-CM | POA: Diagnosis not present

## 2023-07-26 DIAGNOSIS — I129 Hypertensive chronic kidney disease with stage 1 through stage 4 chronic kidney disease, or unspecified chronic kidney disease: Secondary | ICD-10-CM | POA: Diagnosis not present

## 2023-07-28 DIAGNOSIS — R350 Frequency of micturition: Secondary | ICD-10-CM | POA: Diagnosis not present

## 2023-07-28 DIAGNOSIS — E7800 Pure hypercholesterolemia, unspecified: Secondary | ICD-10-CM | POA: Diagnosis not present

## 2023-07-28 DIAGNOSIS — G629 Polyneuropathy, unspecified: Secondary | ICD-10-CM | POA: Diagnosis not present

## 2023-07-28 DIAGNOSIS — M545 Low back pain, unspecified: Secondary | ICD-10-CM | POA: Diagnosis not present

## 2023-07-28 DIAGNOSIS — N1832 Chronic kidney disease, stage 3b: Secondary | ICD-10-CM | POA: Diagnosis not present

## 2023-07-28 DIAGNOSIS — N3281 Overactive bladder: Secondary | ICD-10-CM | POA: Diagnosis not present

## 2023-07-28 DIAGNOSIS — Z8673 Personal history of transient ischemic attack (TIA), and cerebral infarction without residual deficits: Secondary | ICD-10-CM | POA: Diagnosis not present

## 2023-07-28 DIAGNOSIS — M5136 Other intervertebral disc degeneration, lumbar region with discogenic back pain only: Secondary | ICD-10-CM | POA: Diagnosis not present

## 2023-07-28 DIAGNOSIS — J449 Chronic obstructive pulmonary disease, unspecified: Secondary | ICD-10-CM | POA: Diagnosis not present

## 2023-07-28 DIAGNOSIS — Z87891 Personal history of nicotine dependence: Secondary | ICD-10-CM | POA: Diagnosis not present

## 2023-07-28 DIAGNOSIS — F419 Anxiety disorder, unspecified: Secondary | ICD-10-CM | POA: Diagnosis not present

## 2023-07-28 DIAGNOSIS — E039 Hypothyroidism, unspecified: Secondary | ICD-10-CM | POA: Diagnosis not present

## 2023-07-28 DIAGNOSIS — Z85828 Personal history of other malignant neoplasm of skin: Secondary | ICD-10-CM | POA: Diagnosis not present

## 2023-07-28 DIAGNOSIS — Z556 Problems related to health literacy: Secondary | ICD-10-CM | POA: Diagnosis not present

## 2023-07-28 DIAGNOSIS — T17208D Unspecified foreign body in pharynx causing other injury, subsequent encounter: Secondary | ICD-10-CM | POA: Diagnosis not present

## 2023-07-28 DIAGNOSIS — I700 Atherosclerosis of aorta: Secondary | ICD-10-CM | POA: Diagnosis not present

## 2023-07-28 DIAGNOSIS — I129 Hypertensive chronic kidney disease with stage 1 through stage 4 chronic kidney disease, or unspecified chronic kidney disease: Secondary | ICD-10-CM | POA: Diagnosis not present

## 2023-07-28 DIAGNOSIS — M47812 Spondylosis without myelopathy or radiculopathy, cervical region: Secondary | ICD-10-CM | POA: Diagnosis not present

## 2023-08-02 DIAGNOSIS — T17208D Unspecified foreign body in pharynx causing other injury, subsequent encounter: Secondary | ICD-10-CM | POA: Diagnosis not present

## 2023-08-02 DIAGNOSIS — Z87891 Personal history of nicotine dependence: Secondary | ICD-10-CM | POA: Diagnosis not present

## 2023-08-02 DIAGNOSIS — J449 Chronic obstructive pulmonary disease, unspecified: Secondary | ICD-10-CM | POA: Diagnosis not present

## 2023-08-02 DIAGNOSIS — M5136 Other intervertebral disc degeneration, lumbar region with discogenic back pain only: Secondary | ICD-10-CM | POA: Diagnosis not present

## 2023-08-02 DIAGNOSIS — Z85828 Personal history of other malignant neoplasm of skin: Secondary | ICD-10-CM | POA: Diagnosis not present

## 2023-08-02 DIAGNOSIS — F419 Anxiety disorder, unspecified: Secondary | ICD-10-CM | POA: Diagnosis not present

## 2023-08-02 DIAGNOSIS — Z556 Problems related to health literacy: Secondary | ICD-10-CM | POA: Diagnosis not present

## 2023-08-02 DIAGNOSIS — M47812 Spondylosis without myelopathy or radiculopathy, cervical region: Secondary | ICD-10-CM | POA: Diagnosis not present

## 2023-08-02 DIAGNOSIS — M545 Low back pain, unspecified: Secondary | ICD-10-CM | POA: Diagnosis not present

## 2023-08-02 DIAGNOSIS — E7800 Pure hypercholesterolemia, unspecified: Secondary | ICD-10-CM | POA: Diagnosis not present

## 2023-08-02 DIAGNOSIS — I129 Hypertensive chronic kidney disease with stage 1 through stage 4 chronic kidney disease, or unspecified chronic kidney disease: Secondary | ICD-10-CM | POA: Diagnosis not present

## 2023-08-02 DIAGNOSIS — N1832 Chronic kidney disease, stage 3b: Secondary | ICD-10-CM | POA: Diagnosis not present

## 2023-08-02 DIAGNOSIS — I700 Atherosclerosis of aorta: Secondary | ICD-10-CM | POA: Diagnosis not present

## 2023-08-02 DIAGNOSIS — G629 Polyneuropathy, unspecified: Secondary | ICD-10-CM | POA: Diagnosis not present

## 2023-08-02 DIAGNOSIS — R350 Frequency of micturition: Secondary | ICD-10-CM | POA: Diagnosis not present

## 2023-08-02 DIAGNOSIS — N3281 Overactive bladder: Secondary | ICD-10-CM | POA: Diagnosis not present

## 2023-08-02 DIAGNOSIS — E039 Hypothyroidism, unspecified: Secondary | ICD-10-CM | POA: Diagnosis not present

## 2023-08-02 DIAGNOSIS — Z8673 Personal history of transient ischemic attack (TIA), and cerebral infarction without residual deficits: Secondary | ICD-10-CM | POA: Diagnosis not present

## 2023-08-04 DIAGNOSIS — E7800 Pure hypercholesterolemia, unspecified: Secondary | ICD-10-CM | POA: Diagnosis not present

## 2023-08-04 DIAGNOSIS — F419 Anxiety disorder, unspecified: Secondary | ICD-10-CM | POA: Diagnosis not present

## 2023-08-04 DIAGNOSIS — I700 Atherosclerosis of aorta: Secondary | ICD-10-CM | POA: Diagnosis not present

## 2023-08-04 DIAGNOSIS — G629 Polyneuropathy, unspecified: Secondary | ICD-10-CM | POA: Diagnosis not present

## 2023-08-04 DIAGNOSIS — Z85828 Personal history of other malignant neoplasm of skin: Secondary | ICD-10-CM | POA: Diagnosis not present

## 2023-08-04 DIAGNOSIS — N3281 Overactive bladder: Secondary | ICD-10-CM | POA: Diagnosis not present

## 2023-08-04 DIAGNOSIS — E039 Hypothyroidism, unspecified: Secondary | ICD-10-CM | POA: Diagnosis not present

## 2023-08-04 DIAGNOSIS — J449 Chronic obstructive pulmonary disease, unspecified: Secondary | ICD-10-CM | POA: Diagnosis not present

## 2023-08-04 DIAGNOSIS — Z87891 Personal history of nicotine dependence: Secondary | ICD-10-CM | POA: Diagnosis not present

## 2023-08-04 DIAGNOSIS — Z8673 Personal history of transient ischemic attack (TIA), and cerebral infarction without residual deficits: Secondary | ICD-10-CM | POA: Diagnosis not present

## 2023-08-04 DIAGNOSIS — N1832 Chronic kidney disease, stage 3b: Secondary | ICD-10-CM | POA: Diagnosis not present

## 2023-08-04 DIAGNOSIS — T17208D Unspecified foreign body in pharynx causing other injury, subsequent encounter: Secondary | ICD-10-CM | POA: Diagnosis not present

## 2023-08-04 DIAGNOSIS — M545 Low back pain, unspecified: Secondary | ICD-10-CM | POA: Diagnosis not present

## 2023-08-04 DIAGNOSIS — M47812 Spondylosis without myelopathy or radiculopathy, cervical region: Secondary | ICD-10-CM | POA: Diagnosis not present

## 2023-08-04 DIAGNOSIS — M5136 Other intervertebral disc degeneration, lumbar region with discogenic back pain only: Secondary | ICD-10-CM | POA: Diagnosis not present

## 2023-08-04 DIAGNOSIS — Z556 Problems related to health literacy: Secondary | ICD-10-CM | POA: Diagnosis not present

## 2023-08-04 DIAGNOSIS — I129 Hypertensive chronic kidney disease with stage 1 through stage 4 chronic kidney disease, or unspecified chronic kidney disease: Secondary | ICD-10-CM | POA: Diagnosis not present

## 2023-08-04 DIAGNOSIS — R350 Frequency of micturition: Secondary | ICD-10-CM | POA: Diagnosis not present

## 2023-08-08 DIAGNOSIS — E7800 Pure hypercholesterolemia, unspecified: Secondary | ICD-10-CM | POA: Diagnosis not present

## 2023-08-08 DIAGNOSIS — Z87891 Personal history of nicotine dependence: Secondary | ICD-10-CM | POA: Diagnosis not present

## 2023-08-08 DIAGNOSIS — T17208D Unspecified foreign body in pharynx causing other injury, subsequent encounter: Secondary | ICD-10-CM | POA: Diagnosis not present

## 2023-08-08 DIAGNOSIS — I129 Hypertensive chronic kidney disease with stage 1 through stage 4 chronic kidney disease, or unspecified chronic kidney disease: Secondary | ICD-10-CM | POA: Diagnosis not present

## 2023-08-08 DIAGNOSIS — I700 Atherosclerosis of aorta: Secondary | ICD-10-CM | POA: Diagnosis not present

## 2023-08-08 DIAGNOSIS — M545 Low back pain, unspecified: Secondary | ICD-10-CM | POA: Diagnosis not present

## 2023-08-08 DIAGNOSIS — J449 Chronic obstructive pulmonary disease, unspecified: Secondary | ICD-10-CM | POA: Diagnosis not present

## 2023-08-08 DIAGNOSIS — R350 Frequency of micturition: Secondary | ICD-10-CM | POA: Diagnosis not present

## 2023-08-08 DIAGNOSIS — M47812 Spondylosis without myelopathy or radiculopathy, cervical region: Secondary | ICD-10-CM | POA: Diagnosis not present

## 2023-08-08 DIAGNOSIS — Z8673 Personal history of transient ischemic attack (TIA), and cerebral infarction without residual deficits: Secondary | ICD-10-CM | POA: Diagnosis not present

## 2023-08-08 DIAGNOSIS — M5136 Other intervertebral disc degeneration, lumbar region with discogenic back pain only: Secondary | ICD-10-CM | POA: Diagnosis not present

## 2023-08-08 DIAGNOSIS — N3281 Overactive bladder: Secondary | ICD-10-CM | POA: Diagnosis not present

## 2023-08-08 DIAGNOSIS — Z85828 Personal history of other malignant neoplasm of skin: Secondary | ICD-10-CM | POA: Diagnosis not present

## 2023-08-08 DIAGNOSIS — Z556 Problems related to health literacy: Secondary | ICD-10-CM | POA: Diagnosis not present

## 2023-08-08 DIAGNOSIS — E039 Hypothyroidism, unspecified: Secondary | ICD-10-CM | POA: Diagnosis not present

## 2023-08-08 DIAGNOSIS — N1832 Chronic kidney disease, stage 3b: Secondary | ICD-10-CM | POA: Diagnosis not present

## 2023-08-08 DIAGNOSIS — G629 Polyneuropathy, unspecified: Secondary | ICD-10-CM | POA: Diagnosis not present

## 2023-08-08 DIAGNOSIS — F419 Anxiety disorder, unspecified: Secondary | ICD-10-CM | POA: Diagnosis not present

## 2023-08-11 DIAGNOSIS — G629 Polyneuropathy, unspecified: Secondary | ICD-10-CM | POA: Diagnosis not present

## 2023-08-11 DIAGNOSIS — T17208D Unspecified foreign body in pharynx causing other injury, subsequent encounter: Secondary | ICD-10-CM | POA: Diagnosis not present

## 2023-08-11 DIAGNOSIS — E7800 Pure hypercholesterolemia, unspecified: Secondary | ICD-10-CM | POA: Diagnosis not present

## 2023-08-11 DIAGNOSIS — M5136 Other intervertebral disc degeneration, lumbar region with discogenic back pain only: Secondary | ICD-10-CM | POA: Diagnosis not present

## 2023-08-11 DIAGNOSIS — E039 Hypothyroidism, unspecified: Secondary | ICD-10-CM | POA: Diagnosis not present

## 2023-08-11 DIAGNOSIS — R350 Frequency of micturition: Secondary | ICD-10-CM | POA: Diagnosis not present

## 2023-08-11 DIAGNOSIS — M545 Low back pain, unspecified: Secondary | ICD-10-CM | POA: Diagnosis not present

## 2023-08-11 DIAGNOSIS — Z85828 Personal history of other malignant neoplasm of skin: Secondary | ICD-10-CM | POA: Diagnosis not present

## 2023-08-11 DIAGNOSIS — F419 Anxiety disorder, unspecified: Secondary | ICD-10-CM | POA: Diagnosis not present

## 2023-08-11 DIAGNOSIS — Z556 Problems related to health literacy: Secondary | ICD-10-CM | POA: Diagnosis not present

## 2023-08-11 DIAGNOSIS — N3281 Overactive bladder: Secondary | ICD-10-CM | POA: Diagnosis not present

## 2023-08-11 DIAGNOSIS — I129 Hypertensive chronic kidney disease with stage 1 through stage 4 chronic kidney disease, or unspecified chronic kidney disease: Secondary | ICD-10-CM | POA: Diagnosis not present

## 2023-08-11 DIAGNOSIS — Z8673 Personal history of transient ischemic attack (TIA), and cerebral infarction without residual deficits: Secondary | ICD-10-CM | POA: Diagnosis not present

## 2023-08-11 DIAGNOSIS — J449 Chronic obstructive pulmonary disease, unspecified: Secondary | ICD-10-CM | POA: Diagnosis not present

## 2023-08-11 DIAGNOSIS — N1832 Chronic kidney disease, stage 3b: Secondary | ICD-10-CM | POA: Diagnosis not present

## 2023-08-11 DIAGNOSIS — M47812 Spondylosis without myelopathy or radiculopathy, cervical region: Secondary | ICD-10-CM | POA: Diagnosis not present

## 2023-08-11 DIAGNOSIS — Z87891 Personal history of nicotine dependence: Secondary | ICD-10-CM | POA: Diagnosis not present

## 2023-08-11 DIAGNOSIS — I700 Atherosclerosis of aorta: Secondary | ICD-10-CM | POA: Diagnosis not present

## 2023-08-15 DIAGNOSIS — Z8673 Personal history of transient ischemic attack (TIA), and cerebral infarction without residual deficits: Secondary | ICD-10-CM | POA: Diagnosis not present

## 2023-08-15 DIAGNOSIS — G629 Polyneuropathy, unspecified: Secondary | ICD-10-CM | POA: Diagnosis not present

## 2023-08-15 DIAGNOSIS — I129 Hypertensive chronic kidney disease with stage 1 through stage 4 chronic kidney disease, or unspecified chronic kidney disease: Secondary | ICD-10-CM | POA: Diagnosis not present

## 2023-08-15 DIAGNOSIS — J449 Chronic obstructive pulmonary disease, unspecified: Secondary | ICD-10-CM | POA: Diagnosis not present

## 2023-08-15 DIAGNOSIS — M47812 Spondylosis without myelopathy or radiculopathy, cervical region: Secondary | ICD-10-CM | POA: Diagnosis not present

## 2023-08-15 DIAGNOSIS — M5136 Other intervertebral disc degeneration, lumbar region with discogenic back pain only: Secondary | ICD-10-CM | POA: Diagnosis not present

## 2023-08-15 DIAGNOSIS — E039 Hypothyroidism, unspecified: Secondary | ICD-10-CM | POA: Diagnosis not present

## 2023-08-15 DIAGNOSIS — Z85828 Personal history of other malignant neoplasm of skin: Secondary | ICD-10-CM | POA: Diagnosis not present

## 2023-08-15 DIAGNOSIS — I700 Atherosclerosis of aorta: Secondary | ICD-10-CM | POA: Diagnosis not present

## 2023-08-15 DIAGNOSIS — N3281 Overactive bladder: Secondary | ICD-10-CM | POA: Diagnosis not present

## 2023-08-15 DIAGNOSIS — Z87891 Personal history of nicotine dependence: Secondary | ICD-10-CM | POA: Diagnosis not present

## 2023-08-15 DIAGNOSIS — N1832 Chronic kidney disease, stage 3b: Secondary | ICD-10-CM | POA: Diagnosis not present

## 2023-08-15 DIAGNOSIS — T17208D Unspecified foreign body in pharynx causing other injury, subsequent encounter: Secondary | ICD-10-CM | POA: Diagnosis not present

## 2023-08-15 DIAGNOSIS — F419 Anxiety disorder, unspecified: Secondary | ICD-10-CM | POA: Diagnosis not present

## 2023-08-15 DIAGNOSIS — Z556 Problems related to health literacy: Secondary | ICD-10-CM | POA: Diagnosis not present

## 2023-08-15 DIAGNOSIS — R350 Frequency of micturition: Secondary | ICD-10-CM | POA: Diagnosis not present

## 2023-08-15 DIAGNOSIS — M545 Low back pain, unspecified: Secondary | ICD-10-CM | POA: Diagnosis not present

## 2023-08-15 DIAGNOSIS — E7800 Pure hypercholesterolemia, unspecified: Secondary | ICD-10-CM | POA: Diagnosis not present

## 2023-08-24 DIAGNOSIS — Z556 Problems related to health literacy: Secondary | ICD-10-CM | POA: Diagnosis not present

## 2023-08-24 DIAGNOSIS — N3281 Overactive bladder: Secondary | ICD-10-CM | POA: Diagnosis not present

## 2023-08-24 DIAGNOSIS — E039 Hypothyroidism, unspecified: Secondary | ICD-10-CM | POA: Diagnosis not present

## 2023-08-24 DIAGNOSIS — R350 Frequency of micturition: Secondary | ICD-10-CM | POA: Diagnosis not present

## 2023-08-24 DIAGNOSIS — M47812 Spondylosis without myelopathy or radiculopathy, cervical region: Secondary | ICD-10-CM | POA: Diagnosis not present

## 2023-08-24 DIAGNOSIS — M545 Low back pain, unspecified: Secondary | ICD-10-CM | POA: Diagnosis not present

## 2023-08-24 DIAGNOSIS — F419 Anxiety disorder, unspecified: Secondary | ICD-10-CM | POA: Diagnosis not present

## 2023-08-24 DIAGNOSIS — E7800 Pure hypercholesterolemia, unspecified: Secondary | ICD-10-CM | POA: Diagnosis not present

## 2023-08-24 DIAGNOSIS — G629 Polyneuropathy, unspecified: Secondary | ICD-10-CM | POA: Diagnosis not present

## 2023-08-24 DIAGNOSIS — M5136 Other intervertebral disc degeneration, lumbar region with discogenic back pain only: Secondary | ICD-10-CM | POA: Diagnosis not present

## 2023-08-24 DIAGNOSIS — J449 Chronic obstructive pulmonary disease, unspecified: Secondary | ICD-10-CM | POA: Diagnosis not present

## 2023-08-24 DIAGNOSIS — I129 Hypertensive chronic kidney disease with stage 1 through stage 4 chronic kidney disease, or unspecified chronic kidney disease: Secondary | ICD-10-CM | POA: Diagnosis not present

## 2023-08-24 DIAGNOSIS — Z85828 Personal history of other malignant neoplasm of skin: Secondary | ICD-10-CM | POA: Diagnosis not present

## 2023-08-24 DIAGNOSIS — Z87891 Personal history of nicotine dependence: Secondary | ICD-10-CM | POA: Diagnosis not present

## 2023-08-24 DIAGNOSIS — I700 Atherosclerosis of aorta: Secondary | ICD-10-CM | POA: Diagnosis not present

## 2023-08-24 DIAGNOSIS — T17208D Unspecified foreign body in pharynx causing other injury, subsequent encounter: Secondary | ICD-10-CM | POA: Diagnosis not present

## 2023-08-24 DIAGNOSIS — N1832 Chronic kidney disease, stage 3b: Secondary | ICD-10-CM | POA: Diagnosis not present

## 2023-08-24 DIAGNOSIS — Z8673 Personal history of transient ischemic attack (TIA), and cerebral infarction without residual deficits: Secondary | ICD-10-CM | POA: Diagnosis not present

## 2023-08-31 DIAGNOSIS — H9192 Unspecified hearing loss, left ear: Secondary | ICD-10-CM | POA: Diagnosis not present

## 2023-09-01 DIAGNOSIS — G629 Polyneuropathy, unspecified: Secondary | ICD-10-CM | POA: Diagnosis not present

## 2023-09-01 DIAGNOSIS — N3281 Overactive bladder: Secondary | ICD-10-CM | POA: Diagnosis not present

## 2023-09-01 DIAGNOSIS — R350 Frequency of micturition: Secondary | ICD-10-CM | POA: Diagnosis not present

## 2023-09-01 DIAGNOSIS — T17208D Unspecified foreign body in pharynx causing other injury, subsequent encounter: Secondary | ICD-10-CM | POA: Diagnosis not present

## 2023-09-01 DIAGNOSIS — I129 Hypertensive chronic kidney disease with stage 1 through stage 4 chronic kidney disease, or unspecified chronic kidney disease: Secondary | ICD-10-CM | POA: Diagnosis not present

## 2023-09-01 DIAGNOSIS — M545 Low back pain, unspecified: Secondary | ICD-10-CM | POA: Diagnosis not present

## 2023-09-01 DIAGNOSIS — F419 Anxiety disorder, unspecified: Secondary | ICD-10-CM | POA: Diagnosis not present

## 2023-09-01 DIAGNOSIS — Z556 Problems related to health literacy: Secondary | ICD-10-CM | POA: Diagnosis not present

## 2023-09-01 DIAGNOSIS — N1832 Chronic kidney disease, stage 3b: Secondary | ICD-10-CM | POA: Diagnosis not present

## 2023-09-01 DIAGNOSIS — M47812 Spondylosis without myelopathy or radiculopathy, cervical region: Secondary | ICD-10-CM | POA: Diagnosis not present

## 2023-09-01 DIAGNOSIS — I700 Atherosclerosis of aorta: Secondary | ICD-10-CM | POA: Diagnosis not present

## 2023-09-01 DIAGNOSIS — E7800 Pure hypercholesterolemia, unspecified: Secondary | ICD-10-CM | POA: Diagnosis not present

## 2023-09-01 DIAGNOSIS — Z87891 Personal history of nicotine dependence: Secondary | ICD-10-CM | POA: Diagnosis not present

## 2023-09-01 DIAGNOSIS — M5136 Other intervertebral disc degeneration, lumbar region with discogenic back pain only: Secondary | ICD-10-CM | POA: Diagnosis not present

## 2023-09-01 DIAGNOSIS — Z8673 Personal history of transient ischemic attack (TIA), and cerebral infarction without residual deficits: Secondary | ICD-10-CM | POA: Diagnosis not present

## 2023-09-01 DIAGNOSIS — Z85828 Personal history of other malignant neoplasm of skin: Secondary | ICD-10-CM | POA: Diagnosis not present

## 2023-09-01 DIAGNOSIS — J449 Chronic obstructive pulmonary disease, unspecified: Secondary | ICD-10-CM | POA: Diagnosis not present

## 2023-09-01 DIAGNOSIS — E039 Hypothyroidism, unspecified: Secondary | ICD-10-CM | POA: Diagnosis not present

## 2023-09-07 DIAGNOSIS — N3281 Overactive bladder: Secondary | ICD-10-CM | POA: Diagnosis not present

## 2023-09-07 DIAGNOSIS — Z556 Problems related to health literacy: Secondary | ICD-10-CM | POA: Diagnosis not present

## 2023-09-07 DIAGNOSIS — M545 Low back pain, unspecified: Secondary | ICD-10-CM | POA: Diagnosis not present

## 2023-09-07 DIAGNOSIS — Z85828 Personal history of other malignant neoplasm of skin: Secondary | ICD-10-CM | POA: Diagnosis not present

## 2023-09-07 DIAGNOSIS — E039 Hypothyroidism, unspecified: Secondary | ICD-10-CM | POA: Diagnosis not present

## 2023-09-07 DIAGNOSIS — G629 Polyneuropathy, unspecified: Secondary | ICD-10-CM | POA: Diagnosis not present

## 2023-09-07 DIAGNOSIS — E7800 Pure hypercholesterolemia, unspecified: Secondary | ICD-10-CM | POA: Diagnosis not present

## 2023-09-07 DIAGNOSIS — M47812 Spondylosis without myelopathy or radiculopathy, cervical region: Secondary | ICD-10-CM | POA: Diagnosis not present

## 2023-09-07 DIAGNOSIS — M5136 Other intervertebral disc degeneration, lumbar region with discogenic back pain only: Secondary | ICD-10-CM | POA: Diagnosis not present

## 2023-09-07 DIAGNOSIS — R350 Frequency of micturition: Secondary | ICD-10-CM | POA: Diagnosis not present

## 2023-09-07 DIAGNOSIS — J449 Chronic obstructive pulmonary disease, unspecified: Secondary | ICD-10-CM | POA: Diagnosis not present

## 2023-09-07 DIAGNOSIS — Z8673 Personal history of transient ischemic attack (TIA), and cerebral infarction without residual deficits: Secondary | ICD-10-CM | POA: Diagnosis not present

## 2023-09-07 DIAGNOSIS — Z87891 Personal history of nicotine dependence: Secondary | ICD-10-CM | POA: Diagnosis not present

## 2023-09-07 DIAGNOSIS — N1832 Chronic kidney disease, stage 3b: Secondary | ICD-10-CM | POA: Diagnosis not present

## 2023-09-07 DIAGNOSIS — T17208D Unspecified foreign body in pharynx causing other injury, subsequent encounter: Secondary | ICD-10-CM | POA: Diagnosis not present

## 2023-09-07 DIAGNOSIS — I129 Hypertensive chronic kidney disease with stage 1 through stage 4 chronic kidney disease, or unspecified chronic kidney disease: Secondary | ICD-10-CM | POA: Diagnosis not present

## 2023-09-07 DIAGNOSIS — I700 Atherosclerosis of aorta: Secondary | ICD-10-CM | POA: Diagnosis not present

## 2023-09-07 DIAGNOSIS — F419 Anxiety disorder, unspecified: Secondary | ICD-10-CM | POA: Diagnosis not present

## 2023-09-09 DIAGNOSIS — R350 Frequency of micturition: Secondary | ICD-10-CM | POA: Diagnosis not present

## 2023-09-09 DIAGNOSIS — I129 Hypertensive chronic kidney disease with stage 1 through stage 4 chronic kidney disease, or unspecified chronic kidney disease: Secondary | ICD-10-CM | POA: Diagnosis not present

## 2023-09-09 DIAGNOSIS — Z85828 Personal history of other malignant neoplasm of skin: Secondary | ICD-10-CM | POA: Diagnosis not present

## 2023-09-09 DIAGNOSIS — E7800 Pure hypercholesterolemia, unspecified: Secondary | ICD-10-CM | POA: Diagnosis not present

## 2023-09-09 DIAGNOSIS — N3281 Overactive bladder: Secondary | ICD-10-CM | POA: Diagnosis not present

## 2023-09-09 DIAGNOSIS — N1832 Chronic kidney disease, stage 3b: Secondary | ICD-10-CM | POA: Diagnosis not present

## 2023-09-09 DIAGNOSIS — G629 Polyneuropathy, unspecified: Secondary | ICD-10-CM | POA: Diagnosis not present

## 2023-09-09 DIAGNOSIS — T17208D Unspecified foreign body in pharynx causing other injury, subsequent encounter: Secondary | ICD-10-CM | POA: Diagnosis not present

## 2023-09-09 DIAGNOSIS — Z556 Problems related to health literacy: Secondary | ICD-10-CM | POA: Diagnosis not present

## 2023-09-09 DIAGNOSIS — M545 Low back pain, unspecified: Secondary | ICD-10-CM | POA: Diagnosis not present

## 2023-09-09 DIAGNOSIS — F419 Anxiety disorder, unspecified: Secondary | ICD-10-CM | POA: Diagnosis not present

## 2023-09-09 DIAGNOSIS — I700 Atherosclerosis of aorta: Secondary | ICD-10-CM | POA: Diagnosis not present

## 2023-09-09 DIAGNOSIS — E039 Hypothyroidism, unspecified: Secondary | ICD-10-CM | POA: Diagnosis not present

## 2023-09-09 DIAGNOSIS — M47812 Spondylosis without myelopathy or radiculopathy, cervical region: Secondary | ICD-10-CM | POA: Diagnosis not present

## 2023-09-09 DIAGNOSIS — Z8673 Personal history of transient ischemic attack (TIA), and cerebral infarction without residual deficits: Secondary | ICD-10-CM | POA: Diagnosis not present

## 2023-09-09 DIAGNOSIS — Z87891 Personal history of nicotine dependence: Secondary | ICD-10-CM | POA: Diagnosis not present

## 2023-09-09 DIAGNOSIS — M5136 Other intervertebral disc degeneration, lumbar region with discogenic back pain only: Secondary | ICD-10-CM | POA: Diagnosis not present

## 2023-09-09 DIAGNOSIS — J449 Chronic obstructive pulmonary disease, unspecified: Secondary | ICD-10-CM | POA: Diagnosis not present

## 2023-09-13 DIAGNOSIS — N1832 Chronic kidney disease, stage 3b: Secondary | ICD-10-CM | POA: Diagnosis not present

## 2023-09-13 DIAGNOSIS — I129 Hypertensive chronic kidney disease with stage 1 through stage 4 chronic kidney disease, or unspecified chronic kidney disease: Secondary | ICD-10-CM | POA: Diagnosis not present

## 2023-09-13 DIAGNOSIS — J449 Chronic obstructive pulmonary disease, unspecified: Secondary | ICD-10-CM | POA: Diagnosis not present

## 2023-09-13 DIAGNOSIS — M5136 Other intervertebral disc degeneration, lumbar region with discogenic back pain only: Secondary | ICD-10-CM | POA: Diagnosis not present

## 2023-09-16 DIAGNOSIS — N3281 Overactive bladder: Secondary | ICD-10-CM | POA: Diagnosis not present

## 2023-09-16 DIAGNOSIS — N1832 Chronic kidney disease, stage 3b: Secondary | ICD-10-CM | POA: Diagnosis not present

## 2023-09-16 DIAGNOSIS — I129 Hypertensive chronic kidney disease with stage 1 through stage 4 chronic kidney disease, or unspecified chronic kidney disease: Secondary | ICD-10-CM | POA: Diagnosis not present

## 2023-09-16 DIAGNOSIS — E7800 Pure hypercholesterolemia, unspecified: Secondary | ICD-10-CM | POA: Diagnosis not present

## 2023-09-16 DIAGNOSIS — Z87891 Personal history of nicotine dependence: Secondary | ICD-10-CM | POA: Diagnosis not present

## 2023-09-16 DIAGNOSIS — J449 Chronic obstructive pulmonary disease, unspecified: Secondary | ICD-10-CM | POA: Diagnosis not present

## 2023-09-16 DIAGNOSIS — M545 Low back pain, unspecified: Secondary | ICD-10-CM | POA: Diagnosis not present

## 2023-09-16 DIAGNOSIS — G629 Polyneuropathy, unspecified: Secondary | ICD-10-CM | POA: Diagnosis not present

## 2023-09-16 DIAGNOSIS — M5136 Other intervertebral disc degeneration, lumbar region with discogenic back pain only: Secondary | ICD-10-CM | POA: Diagnosis not present

## 2023-09-16 DIAGNOSIS — Z8673 Personal history of transient ischemic attack (TIA), and cerebral infarction without residual deficits: Secondary | ICD-10-CM | POA: Diagnosis not present

## 2023-09-16 DIAGNOSIS — Z85828 Personal history of other malignant neoplasm of skin: Secondary | ICD-10-CM | POA: Diagnosis not present

## 2023-09-16 DIAGNOSIS — F419 Anxiety disorder, unspecified: Secondary | ICD-10-CM | POA: Diagnosis not present

## 2023-09-16 DIAGNOSIS — E039 Hypothyroidism, unspecified: Secondary | ICD-10-CM | POA: Diagnosis not present

## 2023-09-16 DIAGNOSIS — I700 Atherosclerosis of aorta: Secondary | ICD-10-CM | POA: Diagnosis not present

## 2023-09-16 DIAGNOSIS — M47812 Spondylosis without myelopathy or radiculopathy, cervical region: Secondary | ICD-10-CM | POA: Diagnosis not present

## 2023-09-16 DIAGNOSIS — Z556 Problems related to health literacy: Secondary | ICD-10-CM | POA: Diagnosis not present

## 2023-09-16 DIAGNOSIS — R350 Frequency of micturition: Secondary | ICD-10-CM | POA: Diagnosis not present

## 2023-09-23 DIAGNOSIS — E7800 Pure hypercholesterolemia, unspecified: Secondary | ICD-10-CM | POA: Diagnosis not present

## 2023-09-23 DIAGNOSIS — Z85828 Personal history of other malignant neoplasm of skin: Secondary | ICD-10-CM | POA: Diagnosis not present

## 2023-09-23 DIAGNOSIS — M5136 Other intervertebral disc degeneration, lumbar region with discogenic back pain only: Secondary | ICD-10-CM | POA: Diagnosis not present

## 2023-09-23 DIAGNOSIS — Z556 Problems related to health literacy: Secondary | ICD-10-CM | POA: Diagnosis not present

## 2023-09-23 DIAGNOSIS — N1832 Chronic kidney disease, stage 3b: Secondary | ICD-10-CM | POA: Diagnosis not present

## 2023-09-23 DIAGNOSIS — J449 Chronic obstructive pulmonary disease, unspecified: Secondary | ICD-10-CM | POA: Diagnosis not present

## 2023-09-23 DIAGNOSIS — F419 Anxiety disorder, unspecified: Secondary | ICD-10-CM | POA: Diagnosis not present

## 2023-09-23 DIAGNOSIS — M545 Low back pain, unspecified: Secondary | ICD-10-CM | POA: Diagnosis not present

## 2023-09-23 DIAGNOSIS — E039 Hypothyroidism, unspecified: Secondary | ICD-10-CM | POA: Diagnosis not present

## 2023-09-23 DIAGNOSIS — G629 Polyneuropathy, unspecified: Secondary | ICD-10-CM | POA: Diagnosis not present

## 2023-09-23 DIAGNOSIS — M47812 Spondylosis without myelopathy or radiculopathy, cervical region: Secondary | ICD-10-CM | POA: Diagnosis not present

## 2023-09-23 DIAGNOSIS — Z87891 Personal history of nicotine dependence: Secondary | ICD-10-CM | POA: Diagnosis not present

## 2023-09-23 DIAGNOSIS — R350 Frequency of micturition: Secondary | ICD-10-CM | POA: Diagnosis not present

## 2023-09-23 DIAGNOSIS — N3281 Overactive bladder: Secondary | ICD-10-CM | POA: Diagnosis not present

## 2023-09-23 DIAGNOSIS — I129 Hypertensive chronic kidney disease with stage 1 through stage 4 chronic kidney disease, or unspecified chronic kidney disease: Secondary | ICD-10-CM | POA: Diagnosis not present

## 2023-09-23 DIAGNOSIS — I700 Atherosclerosis of aorta: Secondary | ICD-10-CM | POA: Diagnosis not present

## 2023-09-23 DIAGNOSIS — Z8673 Personal history of transient ischemic attack (TIA), and cerebral infarction without residual deficits: Secondary | ICD-10-CM | POA: Diagnosis not present

## 2023-09-27 DIAGNOSIS — R350 Frequency of micturition: Secondary | ICD-10-CM | POA: Diagnosis not present

## 2023-09-27 DIAGNOSIS — E7800 Pure hypercholesterolemia, unspecified: Secondary | ICD-10-CM | POA: Diagnosis not present

## 2023-09-27 DIAGNOSIS — F419 Anxiety disorder, unspecified: Secondary | ICD-10-CM | POA: Diagnosis not present

## 2023-09-27 DIAGNOSIS — I700 Atherosclerosis of aorta: Secondary | ICD-10-CM | POA: Diagnosis not present

## 2023-09-27 DIAGNOSIS — M47812 Spondylosis without myelopathy or radiculopathy, cervical region: Secondary | ICD-10-CM | POA: Diagnosis not present

## 2023-09-27 DIAGNOSIS — M5136 Other intervertebral disc degeneration, lumbar region with discogenic back pain only: Secondary | ICD-10-CM | POA: Diagnosis not present

## 2023-09-27 DIAGNOSIS — Z8673 Personal history of transient ischemic attack (TIA), and cerebral infarction without residual deficits: Secondary | ICD-10-CM | POA: Diagnosis not present

## 2023-09-27 DIAGNOSIS — I129 Hypertensive chronic kidney disease with stage 1 through stage 4 chronic kidney disease, or unspecified chronic kidney disease: Secondary | ICD-10-CM | POA: Diagnosis not present

## 2023-09-27 DIAGNOSIS — N3281 Overactive bladder: Secondary | ICD-10-CM | POA: Diagnosis not present

## 2023-09-27 DIAGNOSIS — E039 Hypothyroidism, unspecified: Secondary | ICD-10-CM | POA: Diagnosis not present

## 2023-09-27 DIAGNOSIS — Z87891 Personal history of nicotine dependence: Secondary | ICD-10-CM | POA: Diagnosis not present

## 2023-09-27 DIAGNOSIS — Z556 Problems related to health literacy: Secondary | ICD-10-CM | POA: Diagnosis not present

## 2023-09-27 DIAGNOSIS — G629 Polyneuropathy, unspecified: Secondary | ICD-10-CM | POA: Diagnosis not present

## 2023-09-27 DIAGNOSIS — M545 Low back pain, unspecified: Secondary | ICD-10-CM | POA: Diagnosis not present

## 2023-09-27 DIAGNOSIS — N1832 Chronic kidney disease, stage 3b: Secondary | ICD-10-CM | POA: Diagnosis not present

## 2023-09-27 DIAGNOSIS — J449 Chronic obstructive pulmonary disease, unspecified: Secondary | ICD-10-CM | POA: Diagnosis not present

## 2023-09-27 DIAGNOSIS — Z85828 Personal history of other malignant neoplasm of skin: Secondary | ICD-10-CM | POA: Diagnosis not present

## 2023-10-04 DIAGNOSIS — E7800 Pure hypercholesterolemia, unspecified: Secondary | ICD-10-CM | POA: Diagnosis not present

## 2023-10-04 DIAGNOSIS — N3281 Overactive bladder: Secondary | ICD-10-CM | POA: Diagnosis not present

## 2023-10-04 DIAGNOSIS — I129 Hypertensive chronic kidney disease with stage 1 through stage 4 chronic kidney disease, or unspecified chronic kidney disease: Secondary | ICD-10-CM | POA: Diagnosis not present

## 2023-10-04 DIAGNOSIS — Z556 Problems related to health literacy: Secondary | ICD-10-CM | POA: Diagnosis not present

## 2023-10-04 DIAGNOSIS — F419 Anxiety disorder, unspecified: Secondary | ICD-10-CM | POA: Diagnosis not present

## 2023-10-04 DIAGNOSIS — Z8673 Personal history of transient ischemic attack (TIA), and cerebral infarction without residual deficits: Secondary | ICD-10-CM | POA: Diagnosis not present

## 2023-10-04 DIAGNOSIS — I700 Atherosclerosis of aorta: Secondary | ICD-10-CM | POA: Diagnosis not present

## 2023-10-04 DIAGNOSIS — Z87891 Personal history of nicotine dependence: Secondary | ICD-10-CM | POA: Diagnosis not present

## 2023-10-04 DIAGNOSIS — M5136 Other intervertebral disc degeneration, lumbar region with discogenic back pain only: Secondary | ICD-10-CM | POA: Diagnosis not present

## 2023-10-04 DIAGNOSIS — R350 Frequency of micturition: Secondary | ICD-10-CM | POA: Diagnosis not present

## 2023-10-04 DIAGNOSIS — N1832 Chronic kidney disease, stage 3b: Secondary | ICD-10-CM | POA: Diagnosis not present

## 2023-10-04 DIAGNOSIS — J449 Chronic obstructive pulmonary disease, unspecified: Secondary | ICD-10-CM | POA: Diagnosis not present

## 2023-10-04 DIAGNOSIS — M47812 Spondylosis without myelopathy or radiculopathy, cervical region: Secondary | ICD-10-CM | POA: Diagnosis not present

## 2023-10-04 DIAGNOSIS — Z85828 Personal history of other malignant neoplasm of skin: Secondary | ICD-10-CM | POA: Diagnosis not present

## 2023-10-04 DIAGNOSIS — E039 Hypothyroidism, unspecified: Secondary | ICD-10-CM | POA: Diagnosis not present

## 2023-10-04 DIAGNOSIS — M545 Low back pain, unspecified: Secondary | ICD-10-CM | POA: Diagnosis not present

## 2023-10-04 DIAGNOSIS — G629 Polyneuropathy, unspecified: Secondary | ICD-10-CM | POA: Diagnosis not present

## 2023-10-10 DIAGNOSIS — Z87891 Personal history of nicotine dependence: Secondary | ICD-10-CM | POA: Diagnosis not present

## 2023-10-10 DIAGNOSIS — M545 Low back pain, unspecified: Secondary | ICD-10-CM | POA: Diagnosis not present

## 2023-10-10 DIAGNOSIS — G629 Polyneuropathy, unspecified: Secondary | ICD-10-CM | POA: Diagnosis not present

## 2023-10-10 DIAGNOSIS — Z556 Problems related to health literacy: Secondary | ICD-10-CM | POA: Diagnosis not present

## 2023-10-10 DIAGNOSIS — J449 Chronic obstructive pulmonary disease, unspecified: Secondary | ICD-10-CM | POA: Diagnosis not present

## 2023-10-10 DIAGNOSIS — E7800 Pure hypercholesterolemia, unspecified: Secondary | ICD-10-CM | POA: Diagnosis not present

## 2023-10-10 DIAGNOSIS — M5136 Other intervertebral disc degeneration, lumbar region with discogenic back pain only: Secondary | ICD-10-CM | POA: Diagnosis not present

## 2023-10-10 DIAGNOSIS — M47812 Spondylosis without myelopathy or radiculopathy, cervical region: Secondary | ICD-10-CM | POA: Diagnosis not present

## 2023-10-10 DIAGNOSIS — N1832 Chronic kidney disease, stage 3b: Secondary | ICD-10-CM | POA: Diagnosis not present

## 2023-10-10 DIAGNOSIS — Z85828 Personal history of other malignant neoplasm of skin: Secondary | ICD-10-CM | POA: Diagnosis not present

## 2023-10-10 DIAGNOSIS — F419 Anxiety disorder, unspecified: Secondary | ICD-10-CM | POA: Diagnosis not present

## 2023-10-10 DIAGNOSIS — I129 Hypertensive chronic kidney disease with stage 1 through stage 4 chronic kidney disease, or unspecified chronic kidney disease: Secondary | ICD-10-CM | POA: Diagnosis not present

## 2023-10-10 DIAGNOSIS — N3281 Overactive bladder: Secondary | ICD-10-CM | POA: Diagnosis not present

## 2023-10-10 DIAGNOSIS — I700 Atherosclerosis of aorta: Secondary | ICD-10-CM | POA: Diagnosis not present

## 2023-10-10 DIAGNOSIS — R350 Frequency of micturition: Secondary | ICD-10-CM | POA: Diagnosis not present

## 2023-10-10 DIAGNOSIS — E039 Hypothyroidism, unspecified: Secondary | ICD-10-CM | POA: Diagnosis not present

## 2023-10-10 DIAGNOSIS — Z8673 Personal history of transient ischemic attack (TIA), and cerebral infarction without residual deficits: Secondary | ICD-10-CM | POA: Diagnosis not present

## 2023-10-18 DIAGNOSIS — N1832 Chronic kidney disease, stage 3b: Secondary | ICD-10-CM | POA: Diagnosis not present

## 2023-10-18 DIAGNOSIS — M5136 Other intervertebral disc degeneration, lumbar region with discogenic back pain only: Secondary | ICD-10-CM | POA: Diagnosis not present

## 2023-10-18 DIAGNOSIS — E039 Hypothyroidism, unspecified: Secondary | ICD-10-CM | POA: Diagnosis not present

## 2023-10-18 DIAGNOSIS — M545 Low back pain, unspecified: Secondary | ICD-10-CM | POA: Diagnosis not present

## 2023-10-18 DIAGNOSIS — Z8673 Personal history of transient ischemic attack (TIA), and cerebral infarction without residual deficits: Secondary | ICD-10-CM | POA: Diagnosis not present

## 2023-10-18 DIAGNOSIS — I700 Atherosclerosis of aorta: Secondary | ICD-10-CM | POA: Diagnosis not present

## 2023-10-18 DIAGNOSIS — G629 Polyneuropathy, unspecified: Secondary | ICD-10-CM | POA: Diagnosis not present

## 2023-10-18 DIAGNOSIS — Z85828 Personal history of other malignant neoplasm of skin: Secondary | ICD-10-CM | POA: Diagnosis not present

## 2023-10-18 DIAGNOSIS — Z87891 Personal history of nicotine dependence: Secondary | ICD-10-CM | POA: Diagnosis not present

## 2023-10-18 DIAGNOSIS — R350 Frequency of micturition: Secondary | ICD-10-CM | POA: Diagnosis not present

## 2023-10-18 DIAGNOSIS — J449 Chronic obstructive pulmonary disease, unspecified: Secondary | ICD-10-CM | POA: Diagnosis not present

## 2023-10-18 DIAGNOSIS — F419 Anxiety disorder, unspecified: Secondary | ICD-10-CM | POA: Diagnosis not present

## 2023-10-18 DIAGNOSIS — I129 Hypertensive chronic kidney disease with stage 1 through stage 4 chronic kidney disease, or unspecified chronic kidney disease: Secondary | ICD-10-CM | POA: Diagnosis not present

## 2023-10-18 DIAGNOSIS — E7800 Pure hypercholesterolemia, unspecified: Secondary | ICD-10-CM | POA: Diagnosis not present

## 2023-10-18 DIAGNOSIS — Z556 Problems related to health literacy: Secondary | ICD-10-CM | POA: Diagnosis not present

## 2023-10-18 DIAGNOSIS — N3281 Overactive bladder: Secondary | ICD-10-CM | POA: Diagnosis not present

## 2023-10-18 DIAGNOSIS — M47812 Spondylosis without myelopathy or radiculopathy, cervical region: Secondary | ICD-10-CM | POA: Diagnosis not present

## 2023-10-27 DIAGNOSIS — L820 Inflamed seborrheic keratosis: Secondary | ICD-10-CM | POA: Diagnosis not present

## 2023-10-27 DIAGNOSIS — L570 Actinic keratosis: Secondary | ICD-10-CM | POA: Diagnosis not present

## 2023-10-27 DIAGNOSIS — L728 Other follicular cysts of the skin and subcutaneous tissue: Secondary | ICD-10-CM | POA: Diagnosis not present

## 2023-10-28 DIAGNOSIS — R350 Frequency of micturition: Secondary | ICD-10-CM | POA: Diagnosis not present

## 2023-10-28 DIAGNOSIS — J449 Chronic obstructive pulmonary disease, unspecified: Secondary | ICD-10-CM | POA: Diagnosis not present

## 2023-10-28 DIAGNOSIS — M47812 Spondylosis without myelopathy or radiculopathy, cervical region: Secondary | ICD-10-CM | POA: Diagnosis not present

## 2023-10-28 DIAGNOSIS — G629 Polyneuropathy, unspecified: Secondary | ICD-10-CM | POA: Diagnosis not present

## 2023-10-28 DIAGNOSIS — Z8673 Personal history of transient ischemic attack (TIA), and cerebral infarction without residual deficits: Secondary | ICD-10-CM | POA: Diagnosis not present

## 2023-10-28 DIAGNOSIS — F419 Anxiety disorder, unspecified: Secondary | ICD-10-CM | POA: Diagnosis not present

## 2023-10-28 DIAGNOSIS — I129 Hypertensive chronic kidney disease with stage 1 through stage 4 chronic kidney disease, or unspecified chronic kidney disease: Secondary | ICD-10-CM | POA: Diagnosis not present

## 2023-10-28 DIAGNOSIS — Z87891 Personal history of nicotine dependence: Secondary | ICD-10-CM | POA: Diagnosis not present

## 2023-10-28 DIAGNOSIS — M5136 Other intervertebral disc degeneration, lumbar region with discogenic back pain only: Secondary | ICD-10-CM | POA: Diagnosis not present

## 2023-10-28 DIAGNOSIS — Z556 Problems related to health literacy: Secondary | ICD-10-CM | POA: Diagnosis not present

## 2023-10-28 DIAGNOSIS — N1832 Chronic kidney disease, stage 3b: Secondary | ICD-10-CM | POA: Diagnosis not present

## 2023-10-28 DIAGNOSIS — E039 Hypothyroidism, unspecified: Secondary | ICD-10-CM | POA: Diagnosis not present

## 2023-10-28 DIAGNOSIS — N3281 Overactive bladder: Secondary | ICD-10-CM | POA: Diagnosis not present

## 2023-10-28 DIAGNOSIS — E7800 Pure hypercholesterolemia, unspecified: Secondary | ICD-10-CM | POA: Diagnosis not present

## 2023-10-28 DIAGNOSIS — I700 Atherosclerosis of aorta: Secondary | ICD-10-CM | POA: Diagnosis not present

## 2023-10-28 DIAGNOSIS — Z85828 Personal history of other malignant neoplasm of skin: Secondary | ICD-10-CM | POA: Diagnosis not present

## 2023-10-28 DIAGNOSIS — M545 Low back pain, unspecified: Secondary | ICD-10-CM | POA: Diagnosis not present

## 2023-11-01 DIAGNOSIS — M5136 Other intervertebral disc degeneration, lumbar region with discogenic back pain only: Secondary | ICD-10-CM | POA: Diagnosis not present

## 2023-11-01 DIAGNOSIS — Z8673 Personal history of transient ischemic attack (TIA), and cerebral infarction without residual deficits: Secondary | ICD-10-CM | POA: Diagnosis not present

## 2023-11-01 DIAGNOSIS — I700 Atherosclerosis of aorta: Secondary | ICD-10-CM | POA: Diagnosis not present

## 2023-11-01 DIAGNOSIS — I129 Hypertensive chronic kidney disease with stage 1 through stage 4 chronic kidney disease, or unspecified chronic kidney disease: Secondary | ICD-10-CM | POA: Diagnosis not present

## 2023-11-01 DIAGNOSIS — N3281 Overactive bladder: Secondary | ICD-10-CM | POA: Diagnosis not present

## 2023-11-01 DIAGNOSIS — E7800 Pure hypercholesterolemia, unspecified: Secondary | ICD-10-CM | POA: Diagnosis not present

## 2023-11-01 DIAGNOSIS — N1832 Chronic kidney disease, stage 3b: Secondary | ICD-10-CM | POA: Diagnosis not present

## 2023-11-01 DIAGNOSIS — Z556 Problems related to health literacy: Secondary | ICD-10-CM | POA: Diagnosis not present

## 2023-11-01 DIAGNOSIS — Z87891 Personal history of nicotine dependence: Secondary | ICD-10-CM | POA: Diagnosis not present

## 2023-11-01 DIAGNOSIS — E039 Hypothyroidism, unspecified: Secondary | ICD-10-CM | POA: Diagnosis not present

## 2023-11-01 DIAGNOSIS — J449 Chronic obstructive pulmonary disease, unspecified: Secondary | ICD-10-CM | POA: Diagnosis not present

## 2023-11-01 DIAGNOSIS — Z85828 Personal history of other malignant neoplasm of skin: Secondary | ICD-10-CM | POA: Diagnosis not present

## 2023-11-01 DIAGNOSIS — F419 Anxiety disorder, unspecified: Secondary | ICD-10-CM | POA: Diagnosis not present

## 2023-11-01 DIAGNOSIS — M545 Low back pain, unspecified: Secondary | ICD-10-CM | POA: Diagnosis not present

## 2023-11-01 DIAGNOSIS — G629 Polyneuropathy, unspecified: Secondary | ICD-10-CM | POA: Diagnosis not present

## 2023-11-01 DIAGNOSIS — R350 Frequency of micturition: Secondary | ICD-10-CM | POA: Diagnosis not present

## 2023-11-01 DIAGNOSIS — M47812 Spondylosis without myelopathy or radiculopathy, cervical region: Secondary | ICD-10-CM | POA: Diagnosis not present

## 2024-01-24 DIAGNOSIS — R8290 Unspecified abnormal findings in urine: Secondary | ICD-10-CM | POA: Diagnosis not present
